# Patient Record
Sex: Female | Born: 1945 | Race: White | Hispanic: No | Marital: Married | Smoking: Former smoker
Health system: Southern US, Community
[De-identification: ages and names within clinical notes are randomized; demographics above are authoritative.]

## PROBLEM LIST (undated history)

## (undated) DIAGNOSIS — J45909 Unspecified asthma, uncomplicated: Secondary | ICD-10-CM

## (undated) DIAGNOSIS — C801 Malignant (primary) neoplasm, unspecified: Secondary | ICD-10-CM

## (undated) DIAGNOSIS — I639 Cerebral infarction, unspecified: Secondary | ICD-10-CM

## (undated) DIAGNOSIS — I10 Essential (primary) hypertension: Secondary | ICD-10-CM

## (undated) DIAGNOSIS — J449 Chronic obstructive pulmonary disease, unspecified: Secondary | ICD-10-CM

## (undated) DIAGNOSIS — J439 Emphysema, unspecified: Secondary | ICD-10-CM

## (undated) DIAGNOSIS — E119 Type 2 diabetes mellitus without complications: Secondary | ICD-10-CM

## (undated) HISTORY — PX: ABDOMINAL HYSTERECTOMY: SHX81

## (undated) HISTORY — DX: Cerebral infarction, unspecified: I63.9

## (undated) HISTORY — PX: NECK SURGERY: SHX720

## (undated) HISTORY — PX: CHOLECYSTECTOMY: SHX55

---

## 1988-03-17 HISTORY — PX: BREAST BIOPSY: SHX20

## 2004-07-23 ENCOUNTER — Other Ambulatory Visit: Payer: Self-pay

## 2004-07-26 ENCOUNTER — Ambulatory Visit: Payer: Self-pay | Admitting: Unknown Physician Specialty

## 2004-12-09 ENCOUNTER — Ambulatory Visit: Payer: Self-pay | Admitting: Internal Medicine

## 2006-07-15 ENCOUNTER — Ambulatory Visit: Payer: Self-pay | Admitting: *Deleted

## 2006-10-21 ENCOUNTER — Emergency Department: Payer: Self-pay | Admitting: Internal Medicine

## 2006-10-31 ENCOUNTER — Emergency Department: Payer: Self-pay | Admitting: Emergency Medicine

## 2007-07-14 ENCOUNTER — Ambulatory Visit: Payer: Self-pay | Admitting: Unknown Physician Specialty

## 2007-07-30 ENCOUNTER — Ambulatory Visit: Payer: Self-pay | Admitting: Unknown Physician Specialty

## 2007-08-30 ENCOUNTER — Inpatient Hospital Stay: Payer: Self-pay | Admitting: Internal Medicine

## 2007-08-30 ENCOUNTER — Other Ambulatory Visit: Payer: Self-pay

## 2008-01-11 ENCOUNTER — Emergency Department: Payer: Self-pay | Admitting: Emergency Medicine

## 2008-03-17 DIAGNOSIS — C801 Malignant (primary) neoplasm, unspecified: Secondary | ICD-10-CM

## 2008-03-17 HISTORY — DX: Malignant (primary) neoplasm, unspecified: C80.1

## 2008-06-07 ENCOUNTER — Emergency Department: Payer: Self-pay | Admitting: Emergency Medicine

## 2008-06-20 ENCOUNTER — Emergency Department: Payer: Self-pay | Admitting: Unknown Physician Specialty

## 2008-06-24 ENCOUNTER — Observation Stay (HOSPITAL_COMMUNITY): Admission: EM | Admit: 2008-06-24 | Discharge: 2008-06-27 | Payer: Self-pay | Admitting: Emergency Medicine

## 2010-01-07 ENCOUNTER — Emergency Department (HOSPITAL_COMMUNITY)
Admission: EM | Admit: 2010-01-07 | Discharge: 2010-01-08 | Payer: Self-pay | Source: Home / Self Care | Admitting: Emergency Medicine

## 2010-05-29 LAB — POCT I-STAT, CHEM 8
Calcium, Ion: 1.19 mmol/L (ref 1.12–1.32)
HCT: 42 % (ref 36.0–46.0)
TCO2: 29 mmol/L (ref 0–100)

## 2010-05-29 LAB — GLUCOSE, CAPILLARY: Glucose-Capillary: 110 mg/dL — ABNORMAL HIGH (ref 70–99)

## 2010-06-26 LAB — COMPREHENSIVE METABOLIC PANEL
ALT: 11 U/L (ref 0–35)
ALT: 15 U/L (ref 0–35)
AST: 16 U/L (ref 0–37)
AST: 16 U/L (ref 0–37)
Albumin: 3.6 g/dL (ref 3.5–5.2)
Albumin: 4.4 g/dL (ref 3.5–5.2)
Alkaline Phosphatase: 45 U/L (ref 39–117)
Alkaline Phosphatase: 56 U/L (ref 39–117)
BUN: 6 mg/dL (ref 6–23)
BUN: 8 mg/dL (ref 6–23)
CO2: 28 mEq/L (ref 19–32)
CO2: 30 mEq/L (ref 19–32)
Calcium: 9 mg/dL (ref 8.4–10.5)
Calcium: 9.1 mg/dL (ref 8.4–10.5)
Chloride: 103 mEq/L (ref 96–112)
Chloride: 108 mEq/L (ref 96–112)
Creatinine, Ser: 0.73 mg/dL (ref 0.4–1.2)
Creatinine, Ser: 0.78 mg/dL (ref 0.4–1.2)
GFR calc Af Amer: >60 mL/min (ref >60)
GFR calc Af Amer: >60 mL/min (ref >60)
GFR calc non Af Amer: >60 mL/min (ref >60)
GFR calc non Af Amer: >60 mL/min (ref >60)
Glucose, Bld: 104 mg/dL — ABNORMAL HIGH (ref 70–99)
Glucose, Bld: 96 mg/dL (ref 70–99)
Potassium: 3.3 mEq/L — ABNORMAL LOW (ref 3.5–5.1)
Potassium: 4.1 mEq/L (ref 3.5–5.1)
Sodium: 143 mEq/L (ref 135–145)
Sodium: 143 mEq/L (ref 135–145)
Total Bilirubin: 0.8 mg/dL (ref 0.3–1.2)
Total Bilirubin: 0.9 mg/dL (ref 0.3–1.2)
Total Protein: 5.8 g/dL — ABNORMAL LOW (ref 6.0–8.3)
Total Protein: 6.7 g/dL (ref 6.0–8.3)

## 2010-06-26 LAB — URINE MICROSCOPIC-ADD ON

## 2010-06-26 LAB — CBC
HCT: 34.7 % — ABNORMAL LOW (ref 36.0–46.0)
HCT: 36.5 % (ref 36.0–46.0)
HCT: 40.8 % (ref 36.0–46.0)
Hemoglobin: 12.1 g/dL (ref 12.0–15.0)
Hemoglobin: 12.6 g/dL (ref 12.0–15.0)
Hemoglobin: 13.9 g/dL (ref 12.0–15.0)
MCHC: 34 g/dL (ref 30.0–36.0)
MCHC: 34.4 g/dL (ref 30.0–36.0)
MCHC: 34.9 g/dL (ref 30.0–36.0)
MCV: 95.4 fL (ref 78.0–100.0)
MCV: 95.7 fL (ref 78.0–100.0)
MCV: 96.1 fL (ref 78.0–100.0)
Platelets: 133 10*3/uL — ABNORMAL LOW (ref 150–400)
Platelets: 135 10*3/uL — ABNORMAL LOW (ref 150–400)
Platelets: 172 10*3/uL (ref 150–400)
RBC: 3.63 MIL/uL — ABNORMAL LOW (ref 3.87–5.11)
RBC: 3.82 MIL/uL — ABNORMAL LOW (ref 3.87–5.11)
RBC: 4.25 MIL/uL (ref 3.87–5.11)
RDW: 12.5 % (ref 11.5–15.5)
RDW: 12.6 % (ref 11.5–15.5)
RDW: 12.6 % (ref 11.5–15.5)
WBC: 4.4 10*3/uL (ref 4.0–10.5)
WBC: 5.8 10*3/uL (ref 4.0–10.5)
WBC: 7.4 10*3/uL (ref 4.0–10.5)

## 2010-06-26 LAB — URINALYSIS, ROUTINE W REFLEX MICROSCOPIC
Bilirubin Urine: NEGATIVE
Glucose, UA: NEGATIVE mg/dL
Hgb urine dipstick: NEGATIVE
Ketones, ur: NEGATIVE mg/dL
Nitrite: POSITIVE — AB
Protein, ur: NEGATIVE mg/dL
Specific Gravity, Urine: 1.009 (ref 1.005–1.030)
Urobilinogen, UA: 1 mg/dL (ref 0.0–1.0)
pH: 6.5 (ref 5.0–8.0)

## 2010-06-26 LAB — DIFFERENTIAL
Basophils Absolute: 0 10*3/uL (ref 0.0–0.1)
Basophils Relative: 1 % (ref 0–1)
Eosinophils Absolute: 0.1 10*3/uL (ref 0.0–0.7)
Eosinophils Relative: 1 % (ref 0–5)
Lymphocytes Relative: 33 % (ref 12–46)
Lymphs Abs: 2.4 10*3/uL (ref 0.7–4.0)
Monocytes Absolute: 0.5 10*3/uL (ref 0.1–1.0)
Monocytes Relative: 6 % (ref 3–12)
Neutro Abs: 4.4 10*3/uL (ref 1.7–7.7)
Neutrophils Relative %: 59 % (ref 43–77)

## 2010-06-26 LAB — GLUCOSE, CAPILLARY
Glucose-Capillary: 100 mg/dL — ABNORMAL HIGH (ref 70–99)
Glucose-Capillary: 100 mg/dL — ABNORMAL HIGH (ref 70–99)
Glucose-Capillary: 103 mg/dL — ABNORMAL HIGH (ref 70–99)
Glucose-Capillary: 111 mg/dL — ABNORMAL HIGH (ref 70–99)
Glucose-Capillary: 117 mg/dL — ABNORMAL HIGH (ref 70–99)
Glucose-Capillary: 121 mg/dL — ABNORMAL HIGH (ref 70–99)
Glucose-Capillary: 122 mg/dL — ABNORMAL HIGH (ref 70–99)
Glucose-Capillary: 126 mg/dL — ABNORMAL HIGH (ref 70–99)
Glucose-Capillary: 127 mg/dL — ABNORMAL HIGH (ref 70–99)
Glucose-Capillary: 129 mg/dL — ABNORMAL HIGH (ref 70–99)
Glucose-Capillary: 129 mg/dL — ABNORMAL HIGH (ref 70–99)
Glucose-Capillary: 130 mg/dL — ABNORMAL HIGH (ref 70–99)
Glucose-Capillary: 144 mg/dL — ABNORMAL HIGH (ref 70–99)
Glucose-Capillary: 89 mg/dL (ref 70–99)
Glucose-Capillary: 91 mg/dL (ref 70–99)
Glucose-Capillary: 94 mg/dL (ref 70–99)
Glucose-Capillary: 96 mg/dL (ref 70–99)
Glucose-Capillary: 98 mg/dL (ref 70–99)

## 2010-06-26 LAB — RETICULOCYTES
RBC.: 4.04 MIL/uL (ref 3.87–5.11)
Retic Count, Absolute: 92.9 K/uL (ref 19.0–186.0)
Retic Ct Pct: 2.3 % (ref 0.4–3.1)

## 2010-06-26 LAB — FOLATE: Folate: >20 ng/mL

## 2010-06-26 LAB — PHOSPHORUS: Phosphorus: 3.6 mg/dL (ref 2.3–4.6)

## 2010-06-26 LAB — URINE CULTURE: Colony Count: >=100000

## 2010-06-26 LAB — TSH
TSH: 0.184 u[IU]/mL — ABNORMAL LOW (ref 0.350–4.500)
TSH: 0.454 u[IU]/mL (ref 0.350–4.500)

## 2010-06-26 LAB — IRON AND TIBC
Iron: 75 ug/dL (ref 42–135)
Saturation Ratios: 23 % (ref 20–55)
TIBC: 327 ug/dL (ref 250–470)
UIBC: 252 ug/dL

## 2010-06-26 LAB — CANCER ANTIGEN 19-9: CA 19-9: 18.3 U/mL — ABNORMAL LOW

## 2010-06-26 LAB — HEMOGLOBIN A1C
Hgb A1c MFr Bld: 6.7 % — ABNORMAL HIGH (ref 4.6–6.1)
Mean Plasma Glucose: 146 mg/dL

## 2010-06-26 LAB — HCG, QUANTITATIVE, PREGNANCY: hCG, Beta Chain, Quant, S: 4 m[IU]/mL (ref <5)

## 2010-06-26 LAB — CEA: CEA: 1.4 ng/mL (ref 0.0–5.0)

## 2010-06-26 LAB — VITAMIN B12: Vitamin B-12: 258 pg/mL (ref 211–911)

## 2010-06-26 LAB — SEDIMENTATION RATE: Sed Rate: 9 mm/h (ref 0–22)

## 2010-06-26 LAB — MAGNESIUM: Magnesium: 2.1 mg/dL (ref 1.5–2.5)

## 2010-06-26 LAB — T3, FREE: T3, Free: 2.8 pg/mL (ref 2.3–4.2)

## 2010-06-26 LAB — T4, FREE: Free T4: 1.46 ng/dL (ref 0.80–1.80)

## 2010-06-26 LAB — FERRITIN: Ferritin: 68 ng/mL (ref 22–322)

## 2010-06-26 LAB — AFP TUMOR MARKER: AFP-Tumor Marker: 2.2 ng/mL (ref 0.0–8.0)

## 2010-07-13 ENCOUNTER — Ambulatory Visit: Payer: Self-pay | Admitting: Anesthesiology

## 2010-07-30 NOTE — H&P (Signed)
Kristin [name], [name] NO.:  [id number]   MEDICAL RECORD NO.:  [id number]          PATIENT TYPE:  EMS   LOCATION:  MAJO                         FACILITY:  [facility]   PHYSICIAN:  Lucita Ferrara, MD         DATE OF BIRTH:  25-Apr-1945   DATE OF ADMISSION:  06/24/2008  DATE OF DISCHARGE:                              HISTORY & PHYSICAL   Patient is a 65 year old with chief complaint generalized weakness for 2  or 3 months now.   HISTORY OF PRESENT ILLNESS:  A 65 year old female, lives in [place],  presents with weakness and weight loss of 9 pounds in the last 3 months.  The patient states that it is she had a recurrent urinary tract  infection for the last 3 months.  She is presenting to her primary care  physician with no results.  She is also unable to hold fluids down.  She states that everything tastes funny.  She has had some nausea and  vomiting.  Vomitus nonbiliary, nonbloody.  There is no hematemesis,  hematochezia.  She has had a colonoscopy in the past.  Negative results.  She is a current smoker.  Otherwise 12-point review of systems is  negative.  She had a workup in the emergency room.  Per her labs, it  does not look like she has dehydration, yet clinically she looks dry.  She does have a urinary tract infection.  I was asked to admit for  patient not feeling comfortable to go home, urinary tract infection and  not being able to tolerate p.o.   PAST MEDICAL HISTORY:  Significant for:  1. Diabetes type 2.  2. Recurrent urinary tract infection.  3. COPD.  4. Hypertension.   FAMILY HISTORY:  Significant for coronary artery disease and  hypertension.   SOCIAL HISTORY:  The patient denies drugs, alcohol.  She currently  smokes 1/2 pack per day for the last 10 years.   ALLERGIES:  Allergic to CODEINE and MORPHINE.   MEDICATIONS AT HOME:  1. Synthroid.  2. Trazodone.  3. Spiriva.  4. Xopenex.  5. Perforomist Inhaler  6. Alprazolam.  7. Meclizine.  8. Aspirin.  9. Losartan.  10.Lasix.  11.Zofran.   REVIEW OF SYSTEMS:  As per HPI, otherwise negative.   PHYSICAL EXAMINATION:  Generally speaking the patient is an elderly  female.  She looks worn down, weak and somewhat cachectic.  HEENT: Normocephalic and atraumatic.  Sclerae are anicteric.  NECK:  Supple with no JVD.  CARDIOVASCULAR:  S1, S2.  Regular rate and rhythm.  No murmurs, rubs,  clicks.  LUNGS: Clear to auscultation bilaterally.  No rhonchi, rales or wheeze.  ABDOMEN:  Soft, nontender, nondistended.  Positive bowel sounds.  EXTREMITIES:  No cyanosis, clubbing or edema.  NEUROLOGIC:  The patient is alert and oriented x3.  Cranial nerves  II-  XII grossly intact.  VITALS:  Blood pressure 110/43, pulse 77, respirations 20, pulse ox is  97% on room air, temperature 98.5.   LABORATORY DATA:  UA shows many bacteria, specific gravity 1.009, large  amount of leukocytes,  large amount of nitrites.  Potassium 3.3, BUN 8,  creatinine 0.73.  CBC within normal limits.   ASSESSMENT/PLAN:  The patient is a 65 year old who presents with  generalized weakness, weight loss of 9 pounds in the last 3 months.  Clinically she may have a urinary tract infection.  Will go ahead and  admit her for urinary tract infection.  She is not anemic.  I think that  she has colon cancer.  She has had screening in the past.  Generalized  weakness may be secondary to depression.  She is not suicidal.  Her  decreased p.o. intake may need to be worked up with endoscopy.  Will go  ahead and start Protonix proton pump inhibitor.  Will initiate gentle  hydration.  Will initiate treatment for urinary tract infection send off  cultures.  Deep vein thrombosis and gastrointestinal prophylaxis with  Lovenox and Protonix.  The rest of plans are dependent on her progress.      Lucita Ferrara, MD  Electronically Signed     RR/MEDQ  D:  06/24/2008  T:  06/24/2008  Job:  [id number]

## 2010-07-30 NOTE — Discharge Summary (Signed)
Kristin [name], Kristin [name]               ACCOUNT NO.:  [id number]   MEDICAL RECORD NO.:  [id number]          PATIENT TYPE:  INP   LOCATION:  4735                         FACILITY:  [facility]   PHYSICIAN:  Herbie Saxon, MDDATE OF BIRTH:  08/02/1945   DATE OF ADMISSION:  06/24/2008  DATE OF DISCHARGE:  06/27/2008                               DISCHARGE SUMMARY   DISCHARGE DIAGNOSES:  1. Urinary tract infection, recurrent.  2. Deconditioning.  3. Tobacco abuse, ongoing.  4. Diabetes mellitus.  5. Chronic obstructive pulmonary disease.  6. Hypertension.  7. Hypothyroidism.  8. Depression.  9. Mild anemia.  10.Mild thrombocytopenia.  11.Coronary artery disease, by workup.   RADIOLOGY:  The CT chest of June 25, 2008, shows a centrilobular  emphysema, subsegmental atelectasis in both lung bases, coronary artery  disease, focal thickening of the anterior pericardium.  CT abdomen shows  fatty infiltration of the liver, no acute upper abdominal findings,  ascites.   HOSPITAL COURSE:  This 65 year old Caucasian lady presented with  complaints of generalized weakness over the last 2-3 months, also  unintentional weight loss about 9 pounds in the last 3 months.  The  patient has been having recurrence of urinary tract infection for the  preceding 3 months with associated nausea and vomiting.  Does smokes  about half pack per day for the last 10 years.  Admission urinalysis was  indicative of a urinary tract infection.  Urine culture came positive  for E. Coli, sensitivity to Rocephin, cefazolin, and Septra.  The  Levaquin she was initially started on was discontinued on June 26, 2008, and she was switched over to Rocephin.  Noticed to have borderline  hypoglycemia was started on Lantus and NovoLog insulin coverage.  Hemoglobin A1c was 6.7.   DISCHARGE CONDITION:  Improved.   Appetite has also improved.  No nausea, vomiting or abdominal pain.  She  is feeling better.  She is to be  discharged home today.   DIET:  A 1800-calorie ADA, 2 g sodium, low cholesterol.   ACTIVITY:  Increase slowly as tolerated.   FOLLOWUP:  Followup with her primary care physician Dr. Bethena Midget in the next  few days to reassess the patient.  Primary care physician would also  continue to optimize glycemic control as outpatient.   MEDICATIONS ON DISCHARGE:  1. Note that the thyroid function test did show she has a low TSH,      Synthroid __________ mcg daily.  2. Lasix 40 mg daily.  3. Losartan 50 mg daily.  4. Aspirin 81 mg b.i.d.  5. Meclizine 25 mg q.8 h. p.r.n.  6. Xanax 1 mg q.12 h. p.r.n.  7. Perforomist inhaler twice daily.  8. Xopenex 2 puffs q.6 hours as needed.  9. Spiriva HandiHaler 18 mcg once daily.  10.Trazodone 100 mg at bedtime.  11.Potassium chloride 10 mEq daily.  12.Multivitamin 1 tablet daily.  13.Tylenol 650 mg q.6 h. p.r.n. mild pain or fever.  14.Septra __________ for the next 5 days.  15.__________ 2.5 mg daily.   Diabetic education as outpatient continued with primary care management.  PHYSICAL EXAMINATION:  GENERAL:  On examination today, she is an elderly  lady not in acute respiratory distress.  VITAL SIGNS:  Stable.  Temperature 98, pulse 64, respiratory rate is 14,  blood pressure 124/60.  HEENT:  Pupils are equal and reactive to light and accommodation.  Head  is atraumatic and normocephalic.  NECK:  Supple.  Not jaundiced.  She is not ill.  There is no elevated  JVD, no thyromegaly, no carotid bruit.  CHEST:  Clinically clear.  HEART:  Heart sounds 1 and 2.  Regular rate and rhythm.  No murmurs,  rubs, gallops, or clicks.  ABDOMEN:  Benign.  EXTREMITIES:  No edema or cyanosis.  CNS:  No neurological deficits.  ABDOMEN:  Soft, nontender.  Bowel sounds positive.  No inguinal hernia.   LABORATORY DATA:  Free T4 __________, T3 2.8, TSH is 0.454.  __________  2.2.  Urine culture E. coli.  Complete blood count, WBC is 5, hematocrit  34, platelet  count is 135.  Chemistry sodium is 136, potassium 4.1,  chloride 108, bicarbonate 30, glucose 96, BUN 6, creatinine __________.  Please note correction in the Synthroid dose.  As the repeat __________  is normal, we can increase the Synthroid dose as above to 75.  It can be  titrated up to 100 mcg daily, also we will repeat thyroid function test  in the next 4 to 6 weeks by the primary care physician.   DISCHARGE TIME:  Greater than 30 minutes.      Herbie Saxon, MD  Electronically Signed     MIO/MEDQ  D:  06/27/2008  T:  06/28/2008  Job:  [id number]

## 2010-08-01 ENCOUNTER — Other Ambulatory Visit: Payer: Self-pay | Admitting: Neurosurgery

## 2010-08-01 DIAGNOSIS — R0989 Other specified symptoms and signs involving the circulatory and respiratory systems: Secondary | ICD-10-CM

## 2010-08-02 ENCOUNTER — Ambulatory Visit
Admission: RE | Admit: 2010-08-02 | Discharge: 2010-08-02 | Disposition: A | Discharge status: Home / Self Care | Payer: BC Managed Care – PPO | Source: Ambulatory Visit | Attending: Neurosurgery | Admitting: Neurosurgery

## 2010-08-02 DIAGNOSIS — R0989 Other specified symptoms and signs involving the circulatory and respiratory systems: Secondary | ICD-10-CM

## 2010-09-09 ENCOUNTER — Encounter (HOSPITAL_COMMUNITY)
Admission: RE | Admit: 2010-09-09 | Discharge: 2010-09-09 | Disposition: A | Discharge status: Home / Self Care | Payer: BC Managed Care – PPO | Source: Ambulatory Visit | Attending: Neurosurgery | Admitting: Neurosurgery

## 2010-09-09 LAB — CBC
HCT: 39.9 % (ref 36.0–46.0)
Hemoglobin: 13.7 g/dL (ref 12.0–15.0)
WBC: 6.9 10*3/uL (ref 4.0–10.5)

## 2010-09-09 LAB — BASIC METABOLIC PANEL
BUN: 11 mg/dL (ref 6–23)
Chloride: 103 mEq/L (ref 96–112)
Glucose, Bld: 101 mg/dL — ABNORMAL HIGH (ref 70–99)
Potassium: 3.4 mEq/L — ABNORMAL LOW (ref 3.5–5.1)

## 2010-09-12 ENCOUNTER — Ambulatory Visit (HOSPITAL_COMMUNITY)
Admission: RE | Admit: 2010-09-12 | Discharge: 2010-09-13 | DRG: 864 | Disposition: A | Discharge status: Home / Self Care | Payer: BC Managed Care – PPO | Source: Ambulatory Visit | Attending: Neurosurgery | Admitting: Neurosurgery

## 2010-09-12 ENCOUNTER — Ambulatory Visit (HOSPITAL_COMMUNITY): Payer: BC Managed Care – PPO

## 2010-09-12 DIAGNOSIS — J449 Chronic obstructive pulmonary disease, unspecified: Secondary | ICD-10-CM | POA: Insufficient documentation

## 2010-09-12 DIAGNOSIS — M5030 Other cervical disc degeneration, unspecified cervical region: Secondary | ICD-10-CM | POA: Insufficient documentation

## 2010-09-12 DIAGNOSIS — M47812 Spondylosis without myelopathy or radiculopathy, cervical region: Secondary | ICD-10-CM | POA: Insufficient documentation

## 2010-09-12 DIAGNOSIS — I10 Essential (primary) hypertension: Secondary | ICD-10-CM | POA: Insufficient documentation

## 2010-09-12 DIAGNOSIS — J4489 Other specified chronic obstructive pulmonary disease: Secondary | ICD-10-CM | POA: Insufficient documentation

## 2010-09-12 LAB — GLUCOSE, CAPILLARY
Glucose-Capillary: 114 mg/dL — ABNORMAL HIGH (ref 70–99)
Glucose-Capillary: 107 mg/dL — ABNORMAL HIGH (ref 70–99)

## 2010-09-13 LAB — GLUCOSE, CAPILLARY: Glucose-Capillary: 117 mg/dL — ABNORMAL HIGH (ref 70–99)

## 2010-09-26 NOTE — Op Note (Signed)
Kristin [name], Kristin [name] NO.:  [id number]  MEDICAL RECORD NO.:  [id number]  LOCATION:  3535                         FACILITY:  [facility]  PHYSICIAN:  Cristi Loron, M.D.DATE OF BIRTH:  1945/08/07  DATE OF PROCEDURE:  09/12/2010 DATE OF DISCHARGE:                              OPERATIVE REPORT   BRIEF HISTORY:  The patient is a 65 year old white female who has suffered from neck, shoulder, arm pain consistent with a cervical radiculopathy/myelopathy.  She has failed medical management and worked up with a cervical MRI which demonstrated the patient had spondylosis, stenosis, etc., at C4-5, C5-6.  I discussed the various treatment options with the patient including surgery.  She has weighed the risks, benefits, and alternatives of surgery and decided to proceed with C4-5 and C5-6 anterior cervical diskectomy fusion and plating.  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  POSTOPERATIVE DIAGNOSES:  C4-5 and C5-6 disk degeneration, spondylosis, stenosis, cervical radiculopathy/myelopathy, cervicalgia.  PROCEDURES:  C4-5 and C5-6 extensive anterior cervical diskectomy/decompression; C4-C5 and C5-C6 anterior interbody arthrodesis with local morselized autograft bone and Actifuse bone graft extender; insertion of C4-5 and C5-6 anterior interbody arthrodesis (Novel PEEK interbody prosthesis); anterior cervical plating with Skyline titanium plate and screws from C4 through C6.  SURGEON:  Cristi Loron, MD.  ASSISTANT:  Clydene Fake, MD.  ANESTHESIA:  Endotracheal.  ESTIMATED BLOOD LOSS:  100 mL.  SPECIMENS:  None.  DRAINS:  None.  COMPLICATIONS:  None.  DESCRIPTION OF PROCEDURE:  The patient was brought to the operating room by the Anesthesia Team.  General endotracheal anesthesia was induced. The patient remained in the supine position.  A roll was placed  under shoulders to keep her neck in a neutral position.  Her anterior cervical region was then prepared with Betadine scrub with Betadine solution. Sterile drapes were applied.  I then injected the area to be incised with Marcaine with epinephrine solution.  I used a scalpel to make a transverse incision in the patient's left anterior neck.  I used the Metzenbaum scissors to divide the platysma muscle and then to dissect medial to sternocleidomastoid muscle, jugular vein, and carotid artery. I carefully dissected down towards the anterior cervical spine identifying the esophagus and retracted it medially.  I then used Kittner swab to clear soft tissue from the anterior cervical spine and then inserted a bent spinal needle into the upper exposed intervertebral disk space.  We obtained intraoperative radiograph to confirm our location.  I then used electrocautery to detach the medial border of the longus colli muscle bilaterally from the C4-C5 and C5-C6 intervertebral disk space.  I then inserted the Caspar self-retaining retractor underneath the longus colli muscle bilaterally to provide exposure.  We began the decompression at C4-C5.  I incised the C4-C5 intervertebral disk with a #15-blade scalpel and performed a partial intervertebral diskectomy with a pituitary forceps.  We then inserted the distraction screws at C4-C5 and distracted the interspace.  I then used a high-speed drill to decorticate the vertebral endplates at C4-C5, to drill away the remainder of C4-C5 intervertebral disk, to drill away some posterior spondylosis, and to thin out the  posterior longitudinal ligament.  We then incised the ligament with arachnoid knife and then removed it with a Kerrison punch undercutting the vertebral endplates at C4-C5 decompressing the thecal sac.  We then performed a foraminotomy about the bilateral C5 nerve roots completing the decompression at this level.  We then repeated this  procedure in an analogous fashion at C5-6 decompressing the C5-6 thecal sac and bilateral C6 nerve roots.  We now turned our attention to the arthrodesis.  We determined to use a 6 and a 5-mm interbody prostheses.  We prefilled these prostheses with a combination of local morselized autograft bone.  We obtained our decompression as well as Actifuse bone graft extender.  We then inserted the prefilled prosthesis into the distracted interspaces at C4-5 and C5- 6 and then removed the distraction screws.  There was a good snug fit of the prosthesis at both interspaces.  We now turned our attention to the anterior spinal instrumentation.  We used a high-speed drill to drill away some ventral spondylosis at  C4-5 and C5-6, so that the plate will lay down flat.  We selected the appropriate-length Skyline anterior cervical plate and laid it along the anterior aspect of the vertebral bodies from C4 through C6.  We then drilled two 12-mm holes at C4, 5, and 6 and then secured the plate to vertebral bodies by placing two 12-mm self-tapping screws at C4, 5, and 6.  We then obtained intraoperative radiograph which demonstrated good position of plate, screws, and interbody prostheses.  We, therefore, secured the screws and plate by locking each cam.  This completed the instrumentation.  We then obtained hemostasis using bipolar electrocautery.  We irrigated the wound out with bacitracin solution.  We then inspected the esophagus for any damages, none apparent.  We then reapproximated the patient's platysma muscle with interrupted 3-0 Vicryl suture, the subcutaneous tissue with interrupted 3-0 Vicryl suture, and the skin with Steri- Strips and benzoin.  The wound was then coated with bacitracin ointment. A sterile dressing was applied.  The drapes were removed.  The patient was subsequently extubated by the Anesthesia Team and transported to postanesthesia care unit in stable condition.  All sponge,  instrument, and needle counts were correct at the end of this case.     Cristi Loron, M.D.     JDJ/MEDQ  D:  09/12/2010  T:  09/13/2010  Job:  [id number]  Electronically Signed by Tressie Stalker M.D. on 09/26/2010 07:54:33 AM

## 2011-01-29 ENCOUNTER — Ambulatory Visit: Payer: Self-pay | Admitting: Anesthesiology

## 2011-08-05 ENCOUNTER — Ambulatory Visit: Payer: Self-pay | Admitting: Family Medicine

## 2011-08-14 ENCOUNTER — Ambulatory Visit: Payer: Self-pay | Admitting: Surgery

## 2011-08-14 LAB — COMPREHENSIVE METABOLIC PANEL
Alkaline Phosphatase: 58 U/L (ref 50–136)
BUN: 16 mg/dL (ref 7–18)
Chloride: 104 mmol/L (ref 98–107)
Co2: 31 mmol/L (ref 21–32)
Creatinine: 1.01 mg/dL (ref 0.60–1.30)
EGFR (African American): >60
EGFR (Non-African Amer.): 58 — ABNORMAL LOW
Glucose: 99 mg/dL (ref 65–99)
Osmolality: 288 (ref 275–301)
SGOT(AST): 13 U/L — ABNORMAL LOW (ref 15–37)
Total Protein: 7.3 g/dL (ref 6.4–8.2)

## 2011-08-14 LAB — CBC WITH DIFFERENTIAL/PLATELET
Basophil #: 0.1 10*3/uL (ref 0.0–0.1)
Eosinophil %: 2.9 %
HCT: 41.5 % (ref 35.0–47.0)
HGB: 14 g/dL (ref 12.0–16.0)
MCH: 32.8 pg (ref 26.0–34.0)
MCHC: 33.8 g/dL (ref 32.0–36.0)
MCV: 97 fL (ref 80–100)
Monocyte %: 7.2 %
Neutrophil #: 4.4 10*3/uL (ref 1.4–6.5)
Neutrophil %: 50.6 %
Platelet: 164 10*3/uL (ref 150–440)
RBC: 4.27 10*6/uL (ref 3.80–5.20)

## 2011-08-14 LAB — AMYLASE: Amylase: 27 U/L (ref 25–115)

## 2011-08-15 ENCOUNTER — Ambulatory Visit: Payer: Self-pay | Admitting: Surgery

## 2011-08-26 LAB — PATHOLOGY REPORT

## 2012-01-08 ENCOUNTER — Ambulatory Visit: Payer: Self-pay

## 2012-04-15 ENCOUNTER — Emergency Department: Payer: Self-pay | Admitting: Emergency Medicine

## 2012-04-15 LAB — CBC
HCT: 40.3 % (ref 35.0–47.0)
HGB: 13.8 g/dL (ref 12.0–16.0)
MCH: 33 pg (ref 26.0–34.0)
MCV: 96 fL (ref 80–100)
Platelet: 157 10*3/uL (ref 150–440)
RBC: 4.18 10*6/uL (ref 3.80–5.20)
WBC: 9.5 10*3/uL (ref 3.6–11.0)

## 2012-04-15 LAB — BASIC METABOLIC PANEL
BUN: 14 mg/dL (ref 7–18)
Calcium, Total: 9.1 mg/dL (ref 8.5–10.1)
Co2: 29 mmol/L (ref 21–32)
Creatinine: 0.71 mg/dL (ref 0.60–1.30)
EGFR (Non-African Amer.): >60
Glucose: 138 mg/dL — ABNORMAL HIGH (ref 65–99)
Potassium: 3.7 mmol/L (ref 3.5–5.1)

## 2012-05-12 ENCOUNTER — Ambulatory Visit: Payer: Self-pay | Admitting: Family Medicine

## 2012-05-25 ENCOUNTER — Emergency Department: Payer: Self-pay | Admitting: Emergency Medicine

## 2012-05-25 LAB — COMPREHENSIVE METABOLIC PANEL
Albumin: 4 g/dL (ref 3.4–5.0)
Bilirubin,Total: 0.5 mg/dL (ref 0.2–1.0)
Calcium, Total: 8.7 mg/dL (ref 8.5–10.1)
Chloride: 106 mmol/L (ref 98–107)
Co2: 28 mmol/L (ref 21–32)
SGPT (ALT): 15 U/L (ref 12–78)
Sodium: 142 mmol/L (ref 136–145)
Total Protein: 6.6 g/dL (ref 6.4–8.2)

## 2012-05-25 LAB — CBC
MCH: 33 pg (ref 26.0–34.0)
MCV: 98 fL (ref 80–100)
Platelet: 155 10*3/uL (ref 150–440)

## 2012-09-09 ENCOUNTER — Ambulatory Visit: Payer: Self-pay | Admitting: Gastroenterology

## 2012-09-12 LAB — PATHOLOGY REPORT

## 2012-10-22 ENCOUNTER — Ambulatory Visit: Payer: Self-pay | Admitting: Family Medicine

## 2012-11-05 ENCOUNTER — Ambulatory Visit: Payer: Self-pay | Admitting: Gastroenterology

## 2012-11-16 ENCOUNTER — Ambulatory Visit: Payer: Self-pay | Admitting: Gastroenterology

## 2012-11-16 LAB — CREATININE, SERUM
EGFR (African American): >60
EGFR (Non-African Amer.): >60

## 2012-11-26 ENCOUNTER — Ambulatory Visit: Payer: Self-pay | Admitting: Gastroenterology

## 2012-11-29 LAB — PATHOLOGY REPORT

## 2013-02-01 ENCOUNTER — Ambulatory Visit: Payer: Self-pay | Admitting: Gastroenterology

## 2013-03-18 ENCOUNTER — Emergency Department: Payer: Self-pay | Admitting: Emergency Medicine

## 2013-03-18 LAB — COMPREHENSIVE METABOLIC PANEL
ALT: 17 U/L (ref 12–78)
ANION GAP: 4 — AB (ref 7–16)
Albumin: 4.2 g/dL (ref 3.4–5.0)
Alkaline Phosphatase: 66 U/L
BILIRUBIN TOTAL: 0.8 mg/dL (ref 0.2–1.0)
BUN: 13 mg/dL (ref 7–18)
CREATININE: 0.7 mg/dL (ref 0.60–1.30)
Calcium, Total: 9 mg/dL (ref 8.5–10.1)
Chloride: 104 mmol/L (ref 98–107)
Co2: 32 mmol/L (ref 21–32)
EGFR (African American): >60
EGFR (Non-African Amer.): >60
Glucose: 117 mg/dL — ABNORMAL HIGH (ref 65–99)
OSMOLALITY: 281 (ref 275–301)
Potassium: 3.5 mmol/L (ref 3.5–5.1)
SGOT(AST): 16 U/L (ref 15–37)
Sodium: 140 mmol/L (ref 136–145)
Total Protein: 6.9 g/dL (ref 6.4–8.2)

## 2013-03-18 LAB — URINALYSIS, COMPLETE
BILIRUBIN, UR: NEGATIVE
Bacteria: NONE SEEN
Blood: NEGATIVE
GLUCOSE, UR: NEGATIVE mg/dL (ref 0–75)
KETONE: NEGATIVE
Nitrite: NEGATIVE
Ph: 7 (ref 4.5–8.0)
Protein: NEGATIVE
Specific Gravity: 1.005 (ref 1.003–1.030)
Squamous Epithelial: 1

## 2013-03-18 LAB — CBC
HCT: 38.8 % (ref 35.0–47.0)
HGB: 13.2 g/dL (ref 12.0–16.0)
MCH: 33.4 pg (ref 26.0–34.0)
MCHC: 34.1 g/dL (ref 32.0–36.0)
MCV: 98 fL (ref 80–100)
PLATELETS: 128 10*3/uL — AB (ref 150–440)
RBC: 3.96 10*6/uL (ref 3.80–5.20)
RDW: 13.3 % (ref 11.5–14.5)
WBC: 5.9 10*3/uL (ref 3.6–11.0)

## 2013-03-18 LAB — TROPONIN I

## 2013-03-20 LAB — URINE CULTURE

## 2013-06-04 ENCOUNTER — Inpatient Hospital Stay: Payer: Self-pay | Admitting: Internal Medicine

## 2013-06-04 LAB — BASIC METABOLIC PANEL
Anion Gap: 6 — ABNORMAL LOW (ref 7–16)
BUN: 18 mg/dL (ref 7–18)
CALCIUM: 9.4 mg/dL (ref 8.5–10.1)
CO2: 32 mmol/L (ref 21–32)
CREATININE: 0.96 mg/dL (ref 0.60–1.30)
Chloride: 104 mmol/L (ref 98–107)
EGFR (African American): >60
GLUCOSE: 74 mg/dL (ref 65–99)
OSMOLALITY: 284 (ref 275–301)
POTASSIUM: 3.5 mmol/L (ref 3.5–5.1)
SODIUM: 142 mmol/L (ref 136–145)

## 2013-06-04 LAB — CBC
HCT: 40.1 % (ref 35.0–47.0)
HGB: 13.7 g/dL (ref 12.0–16.0)
MCH: 33.6 pg (ref 26.0–34.0)
MCHC: 34.3 g/dL (ref 32.0–36.0)
MCV: 98 fL (ref 80–100)
Platelet: 181 10*3/uL (ref 150–440)
RBC: 4.09 10*6/uL (ref 3.80–5.20)
RDW: 13.1 % (ref 11.5–14.5)
WBC: 8.5 10*3/uL (ref 3.6–11.0)

## 2013-06-04 LAB — TROPONIN I: Troponin-I: <0.02 ng/mL

## 2013-06-05 LAB — BASIC METABOLIC PANEL
ANION GAP: 3 — AB (ref 7–16)
BUN: 20 mg/dL — ABNORMAL HIGH (ref 7–18)
CHLORIDE: 105 mmol/L (ref 98–107)
CO2: 29 mmol/L (ref 21–32)
Calcium, Total: 9.2 mg/dL (ref 8.5–10.1)
Creatinine: 0.91 mg/dL (ref 0.60–1.30)
GLUCOSE: 160 mg/dL — AB (ref 65–99)
OSMOLALITY: 280 (ref 275–301)
POTASSIUM: 3.8 mmol/L (ref 3.5–5.1)
Sodium: 137 mmol/L (ref 136–145)

## 2013-06-06 ENCOUNTER — Inpatient Hospital Stay: Payer: Self-pay | Admitting: Internal Medicine

## 2013-06-06 LAB — PROTIME-INR
INR: 1
Prothrombin Time: 12.7 secs (ref 11.5–14.7)

## 2013-06-06 LAB — COMPREHENSIVE METABOLIC PANEL
ALBUMIN: 4.6 g/dL (ref 3.4–5.0)
ALK PHOS: 43 U/L — AB
AST: 13 U/L — AB (ref 15–37)
Anion Gap: 7 (ref 7–16)
BILIRUBIN TOTAL: 0.3 mg/dL (ref 0.2–1.0)
BUN: 26 mg/dL — AB (ref 7–18)
CALCIUM: 9.3 mg/dL (ref 8.5–10.1)
CHLORIDE: 109 mmol/L — AB (ref 98–107)
Co2: 26 mmol/L (ref 21–32)
Creatinine: 1.14 mg/dL (ref 0.60–1.30)
GFR CALC AF AMER: 58 — AB
GFR CALC NON AF AMER: 50 — AB
Glucose: 118 mg/dL — ABNORMAL HIGH (ref 65–99)
OSMOLALITY: 289 (ref 275–301)
Potassium: 3.4 mmol/L — ABNORMAL LOW (ref 3.5–5.1)
SGPT (ALT): 15 U/L (ref 12–78)
SODIUM: 142 mmol/L (ref 136–145)
Total Protein: 7.3 g/dL (ref 6.4–8.2)

## 2013-06-06 LAB — CBC
HCT: 38.6 % (ref 35.0–47.0)
HGB: 12.9 g/dL (ref 12.0–16.0)
MCH: 32.5 pg (ref 26.0–34.0)
MCHC: 33.3 g/dL (ref 32.0–36.0)
MCV: 98 fL (ref 80–100)
PLATELETS: 199 10*3/uL (ref 150–440)
RBC: 3.96 10*6/uL (ref 3.80–5.20)
RDW: 13.3 % (ref 11.5–14.5)
WBC: 17 10*3/uL — ABNORMAL HIGH (ref 3.6–11.0)

## 2013-06-06 LAB — LIPASE, BLOOD: Lipase: 197 U/L (ref 73–393)

## 2013-06-06 LAB — CK TOTAL AND CKMB (NOT AT ARMC)
CK, Total: 123 U/L
CK-MB: 1.7 ng/mL (ref 0.5–3.6)

## 2013-06-06 LAB — MAGNESIUM: Magnesium: 2.2 mg/dL

## 2013-06-06 LAB — TROPONIN I: Troponin-I: <0.02 ng/mL

## 2013-06-06 LAB — APTT: ACTIVATED PTT: 28.4 s (ref 23.6–35.9)

## 2013-06-07 LAB — CBC WITH DIFFERENTIAL/PLATELET
BASOS PCT: 0.1 %
Basophil #: 0 10*3/uL (ref 0.0–0.1)
EOS PCT: 0 %
Eosinophil #: 0 10*3/uL (ref 0.0–0.7)
HCT: 33.7 % — ABNORMAL LOW (ref 35.0–47.0)
HGB: 11.5 g/dL — ABNORMAL LOW (ref 12.0–16.0)
LYMPHS PCT: 9 %
Lymphocyte #: 0.9 10*3/uL — ABNORMAL LOW (ref 1.0–3.6)
MCH: 33.5 pg (ref 26.0–34.0)
MCHC: 34.1 g/dL (ref 32.0–36.0)
MCV: 98 fL (ref 80–100)
Monocyte #: 0.3 x10 3/mm (ref 0.2–0.9)
Monocyte %: 2.9 %
Neutrophil #: 8.4 10*3/uL — ABNORMAL HIGH (ref 1.4–6.5)
Neutrophil %: 88 %
Platelet: 149 10*3/uL — ABNORMAL LOW (ref 150–440)
RBC: 3.43 10*6/uL — ABNORMAL LOW (ref 3.80–5.20)
RDW: 13.4 % (ref 11.5–14.5)
WBC: 9.6 10*3/uL (ref 3.6–11.0)

## 2013-06-07 LAB — CK TOTAL AND CKMB (NOT AT ARMC)
CK, TOTAL: 95 U/L
CK, TOTAL: 95 U/L
CK-MB: 1.7 ng/mL (ref 0.5–3.6)
CK-MB: 1.6 ng/mL (ref 0.5–3.6)

## 2013-06-07 LAB — BASIC METABOLIC PANEL
Anion Gap: 6 — ABNORMAL LOW (ref 7–16)
BUN: 21 mg/dL — ABNORMAL HIGH (ref 7–18)
CO2: 27 mmol/L (ref 21–32)
Calcium, Total: 8.3 mg/dL — ABNORMAL LOW (ref 8.5–10.1)
Chloride: 109 mmol/L — ABNORMAL HIGH (ref 98–107)
Creatinine: 0.94 mg/dL (ref 0.60–1.30)
EGFR (African American): >60
EGFR (Non-African Amer.): >60
Glucose: 212 mg/dL — ABNORMAL HIGH (ref 65–99)
Osmolality: 292 (ref 275–301)
Potassium: 4.1 mmol/L (ref 3.5–5.1)
SODIUM: 142 mmol/L (ref 136–145)

## 2013-06-07 LAB — TROPONIN I
Troponin-I: <0.02 ng/mL
Troponin-I: <0.02 ng/mL

## 2013-06-09 ENCOUNTER — Ambulatory Visit: Payer: Self-pay | Admitting: Internal Medicine

## 2013-07-12 ENCOUNTER — Ambulatory Visit: Payer: Self-pay | Admitting: Gastroenterology

## 2013-10-18 DIAGNOSIS — E119 Type 2 diabetes mellitus without complications: Secondary | ICD-10-CM

## 2013-11-02 ENCOUNTER — Ambulatory Visit: Payer: Self-pay | Admitting: Family Medicine

## 2013-11-23 ENCOUNTER — Ambulatory Visit: Payer: Self-pay | Admitting: Family Medicine

## 2013-11-23 DIAGNOSIS — K219 Gastro-esophageal reflux disease without esophagitis: Secondary | ICD-10-CM | POA: Diagnosis present

## 2013-12-06 ENCOUNTER — Ambulatory Visit: Payer: Self-pay | Admitting: Family Medicine

## 2014-04-20 ENCOUNTER — Ambulatory Visit: Payer: Self-pay | Admitting: Gastroenterology

## 2014-06-02 DIAGNOSIS — I10 Essential (primary) hypertension: Secondary | ICD-10-CM | POA: Diagnosis present

## 2014-06-02 DIAGNOSIS — E039 Hypothyroidism, unspecified: Secondary | ICD-10-CM | POA: Diagnosis present

## 2014-07-08 NOTE — H&P (Signed)
PATIENT NAME:  Kristin [name], Kristin [name] MR#:  [id number] DATE OF BIRTH:  Feb 28, 1946  DATE OF ADMISSION:  06/04/2013  PRIMARY CARE PHYSICIAN: Okey Dupre Myrla Halsted, MD  CHIEF COMPLAINT: Shortness of breath, wheezing.   HISTORY OF PRESENT ILLNESS: A 69 year old female who was seen at [facility] for shortness of breath, wheezing, was sent to the ER for further evaluation. In the ER, she has signs of COPD exacerbation. She has received nebulizers and steroids without much improvement, so she is asked by the hospitalist for consultation for possible admission. The patient says that over the past few days, she has had shortness of breath, wheezing, coughing, low-grade fever. Her cough is nonproductive. She wears 2 liters of oxygen at home and has not changed this, but she does have shortness of breath as mentioned above. She also says that she has a laryngitis that recently started as well.   REVIEW OF SYSTEMS:  CONSTITUTIONAL: Positive low-grade fever, fatigue, and weakness.  EYES: No blurry or double vision or glaucoma.  EARS, NOSE, THROAT: No ear pain, hearing loss, snoring, seasonal allergies.  RESPIRATORY: Positive cough. Positive wheezing. Positive history of COPD, on 2 liters of oxygen. No hemoptysis. Positive dyspnea. CARDIOVASCULAR: No chest pain, orthopnea, edema, arrhythmia, dyspnea on exertion.  GASTROINTESTINAL: No nausea, vomiting, diarrhea, abdominal pain, melena or ulcers.  GENITOURINARY: No dysuria or hematuria.  ENDOCRINE: No polyuria or polydipsia. HEMATOLOGIC AND LYMPHATIC: No anemia or easy bruising.  SKIN: No rashes or lesions.  MUSCULOSKELETAL: No limited activity.  NEUROLOGIC: No history of CVA, TIA, seizures.  PSYCHIATRIC: No history of anxiety or depression.   PAST MEDICAL HISTORY: 1.  Chronic respiratory failure, on 2 liters of oxygen. 2.  COPD/asthma.  3.  Hypothyroidism. 4.  Hypertension. 5.  Diabetes.  6.  Vertigo.   PAST SURGICAL HISTORY: Partial hysterectomy.    ALLERGIES: MORPHINE AND CODEINE.   MEDICATIONS: 1.  Vitamin B12, 1000 mcg daily.  2.  Ventolin HFA 2 puffs q.6 hours p.r.n.  3.  Trazodone 100 mg at bedtime.  4.  Spiriva 18 mcg daily.  5.  Potassium gluconate 550 mg daily.  6.  Hardin Negus' stool softener daily.  7.  Percocet 5/325 q.6 hours p.r.n. pain.  8.  Pantoprazole 40 mg daily.  9.  Macrobid 100 mg b.i.d.  10.  Losartan 50 mg daily.  11.  Synthroid 100 mcg daily.  12.  Lantus 20 units at bedtime.  13.  Ibuprofen 800 mg q.8 hours p.r.n.  14.  Glimepiride 4 mg 1/2 tablet b.i.d.  15.  Lasix 40 mg daily.  16.  Ferrous sulfate 325 daily.  17.  Fenofibrate 160 mg daily.  18.  Clonazepam 1 mg b.i.d.  19.  Atorvastatin 40 mg daily. 20.  Aspirin 162 mg daily.   FAMILY HISTORY: Positive for lung cancer and ovarian cancer.   SOCIAL HISTORY: The patient is trying to quit smoking. She smokes 1 to 5 cigarettes a day. The patient was counseled for 4 minutes regarding this. No IV drug use or alcohol.   PHYSICAL EXAMINATION: VITAL SIGNS: Temperature 98.4, pulse 86, blood pressure 117/62, respiratory rate 20. O2 is 94% on 2 liters.  GENERAL: The patient is sitting up, no tripod, not in acute distress.  HEENT: Head is atraumatic. Pupils are round and reactive. Sclerae anicteric. Mucous membranes are moist. Oropharynx is clear.  NECK: Supple without JVD, carotid bruit or enlarged thyroid.  CARDIOVASCULAR: Regular rate and rhythm. No murmurs, gallops, or rubs. PMI is not displaced.  LUNGS: Diffuse wheezing throughout her lung fields with normal chest expansion. No egophony.  ABDOMEN: Bowel sounds positive. Nontender, nondistended. No hepatosplenomegaly.  EXTREMITIES: No clubbing, cyanosis, or edema.  NEUROLOGIC: Cranial nerves II through XII are intact. There are no focal deficits. SKIN: Without rashes or lesions. MUSCULOSKELETAL: 5 out 5 strength in all extremities.   DIAGNOSTIC DATA: Laboratories: Sodium 142, potassium 3.8 chloride  104, bicarbonate 32, BUN 18, creatinine 0.96, glucose 74. Troponin less than 0.02. White blood cells 8.5, hemoglobin 13.7, hematocrit 40.1; platelets are 181.   Chest x-ray shows no acute cardiopulmonary disease.   EKG reads normal sinus rhythm. No ST elevations or depression. There is an incomplete right bundle branch block.   ASSESSMENT AND PLAN: This is a 69 year old female with chronic respiratory failure on 2 liters of oxygen, who presents with acute chronic obstructive pulmonary disease exacerbation.  1.  Acute on chronic respiratory failure from chronic obstructive pulmonary disease exacerbation as outlined below.  2.  Acute chronic obstructive pulmonary disease exacerbation. The patient will be admitted to medical/surgery floor. We will continue IV steroids, Solu-Medrol 80 mg IV q.8 hours, DuoNebs. Continue her outpatient inhalers and add azithromycin 500 mg daily. Continue to monitor. She is on 2 liters of oxygen, which is at her baseline.  3.  Diabetes. We will continue her outpatient medications including Lantus and glimepiride. ADA diet. Sliding scale insulin.  4.  Tobacco dependence. The patient was counseled regarding quitting smoking. She says she is trying to quit. She says she is down to 1 to 5 cigarettes. Will continue to encourage her to quit smoking. She does not want a nicotine patch. The patient was counseled for 3 minutes.  5.  Hypertension. Will continue with losartan.  6.  Hypothyroidism. Will continue on Synthroid 100 mcg daily.  7.  Hyperlipidemia: Will continue her outpatient medications.  8.  Anxiety. Will continue trazodone and her benzodiazepine.  9.  The patient is full code status.   TIME SPENT: Approximately 45 minutes.   ____________________________ Donell Beers. Benjie Karvonen, MD spm:jcm D: 06/04/2013 18:07:38 ET T: 06/04/2013 19:32:42 ET JOB#: [id number]  cc: Jalayia Bagheri P. Benjie Karvonen, MD, <Dictator> [facility] Myrla Halsted, MD Arionne Iams P Brevon Dewald MD ELECTRONICALLY SIGNED 06/04/2013 20:34

## 2014-07-08 NOTE — H&P (Signed)
PATIENT NAME:  Kristin [name], Kristin [name] MR#:  [id number] DATE OF BIRTH:  12/12/1945  DATE OF ADMISSION:  06/06/2013  PRIMARY CARE PHYSICIAN: Dr. Keith Rake.   REFERRING PHYSICIAN: Dr. Hinda Kehr.   CHIEF COMPLAINT: Shortness of breath.   HISTORY OF PRESENT ILLNESS: Kristin [name] is a 69 year old female with a history of COPD, continued tobacco use who was admitted on 06/04/2013 for COPD exacerbation and was discharged today. The patient states prior to discharge, the patient was doing well. Went home. A couple of hours later, started to notice shortness of breath which gradually worsened to the point that the patient was unable to catch breath. Called EMS. The patient was diffusely wheezing when EMS arrived. The patient received multiple breathing treatments and placed on BiPAP and was brought to the Emergency Department. Per her husband, the patient has seasonal allergies and has a lot of pollen around her house. Experienced some subjective fevers. The patient received multiple breathing treatments in the Emergency Department. ABG obtained in the Emergency Department was well within normal limits. The patient is on BiPAP. Chest x-ray: No active disease.   PAST MEDICAL HISTORY:  1. 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  2. Hypothyroidism.  3. Hypertension.  4. Diabetes mellitus.  5. Vertigo.   PAST SURGICAL HISTORY: Partial hysterectomy.   HOME MEDICATIONS: The patient had is on multiple medications.  1. Vitamin B12 1000 mg once a day.  2. Ventolin 2 puffs every 6 hours as needed.  3. Trazodone 100 mg once a day.  4. Spiriva 18 mcg once a day.  5. Phenergan 25 mg 2 times a day.  6. Prednisone 40 mg once a day.  7. Potassium gluconate 550 mg once a day.  8. Stool softener 1 capsule once a day.  9. Protonix 40 mg 2 times a day.  10. Losartan 50 mg once a day.  11. Levothyroxine 100 mcg once a day.  12. Lantus 10 units subcutaneous once a day.  13. Glimepiride 4 mg 1/2 tablet 2 times a  day.  14. Lasix 40 mg once a day.  15. Ferrous sulfate 325 mg once a day.  16. Fenofibrate 160 mg once a day.  17. Clonazepam 1 mg 2 times a day.  18. Benzonatate 100 mg 2 times a day.  19. Atorvastatin 40 mg once a day.  20. Aspirin 81 mg 2 tablets once a day.   SOCIAL HISTORY: Continues to smoke 5 to 6 cigarettes a day. In the past, smoked heavily. Denies drinking alcohol or using illicit drugs. Married, lives with her husband.   FAMILY HISTORY: Positive for lung cancer and ovarian cancer.   REVIEW OF SYSTEMS:  CONSTITUTIONAL: Generalized weakness.  EYES: No change in vision.  ENT: No change in hearing.  RESPIRATORY: Has cough, shortness of breath.  CARDIOVASCULAR: No chest pain, palpitations.  GASTROINTESTINAL: No nausea, vomiting, abdominal pain.  GENITOURINARY: No dysuria or hematuria.  HEMATOLOGIC: No easy bruising or bleeding.  SKIN: No rash or lesions.  MUSCULOSKELETAL: No joint pains and aches.  NEUROLOGIC:  numbness in any part of the body.   PHYSICAL EXAMINATION:  GENERAL: This is a thin-built female lying down in the bed in mild distress. The patient is able to speak full sentences.  VITAL SIGNS: Temperature , pulse 85, blood pressure 142/65, respiratory rate of 24, oxygen saturation 100% on BiPAP.  HEENT: Head normocephalic, atraumatic. There is no scleral icterus. Conjunctivae normal. Pupils equal and react to light. Extraocular movements are intact. Mucous  membranes moist. No pharyngeal erythema.  NECK: Supple. No lymphadenopathy. No JVD. No carotid bruit, No thyromegaly.  CHEST: Has no focal tenderness. Bilateral diffuse wheezing.  HEART: S1, S2 regular. No murmurs are heard.  ABDOMEN: Bowel sounds present. Soft, nontender, nondistended. No hepatosplenomegaly.  EXTREMITIES: No pedal edema. Pulses 2+.  SKIN: No rash or lesions.  MUSCULOSKELETAL: Good range of motion in all of the extremities.   LABORATORY DATA: ABG: PH of 7.47, pCO2 of 36, pO2 of 169. Troponin  less than 0.02. Coag profile is well within normal limits. CBC: WBC of 17, hemoglobin 12.9, platelet count of 199. CMP: Potassium 3.4. The rest of all of the values are within normal limits.   ASSESSMENT AND PLAN: Kristin [name] is a 69 year old female who was discharged today after being treated for chronic obstructive pulmonary disease exacerbation. Comes in with severe shortness of breath.  1. Chronic obstructive pulmonary disease/asthma exacerbation: Most likely, this is exacerbated by the pollen at home. Admit the patient to the stepdown unit. Continue with the breathing treatments, Solu-Medrol. Continue with levofloxacin.  2. Hypertension: Currently well controlled. Continue with the losartan.  3. Diabetes mellitus: Continue with Lantus. Expect the patient's blood sugars to be poorly controlled. Consider increasing the Lantus while the patient is on steroids.  4. Debility: Will involve physical therapy and occupational therapy.  5. Keep the patient on deep vein thrombosis prophylaxis with Lovenox.   TIME SPENT: 50 minutes.    ____________________________ Monica Becton, MD pv:gb D: 06/06/2013 22:21:41 ET T: 06/06/2013 22:54:49 ET JOB#: [id number]  cc: Monica Becton, MD, <Dictator> Trish Fountain, MD Grier Mitts Priya Matsen MD ELECTRONICALLY SIGNED 06/30/2013 0:19

## 2014-07-08 NOTE — Consult Note (Signed)
PATIENT NAME:  Kristin [name], Kristin [name] MR#:  [id number] DATE OF BIRTH:  1945/11/08  DATE OF CONSULTATION:  06/09/2013  CONSULTING PHYSICIAN:  Holland Kotter E. Raul Del, MD  REASON FOR CONSULTATION: Increased shortness of breath.   HISTORY OF PRESENT ILLNESS: Kristin [name] is a 69 year old lady who is a patient of ours last seen by [place] in [facility] on 05/20/2013 and known to have a stage II COPD. That is her baseline.   FEV1 on 09/28/2012 was 1.26, 52% of predicted. FVC was 3.02, 100% of predicted. FEV1 ratio was 42. At the time when we saw her, she was using her inhaler more than 2 to 3 times a day. This is on Advair as well as Spiriva. Unfortunately, she still continues to smoke. She has a heightened presence of anxiety. At that time, we put her on a prednisone tapering dose and suggested if her wheezing and shortness of breath continued, that we are going to add theophylline. She is also on nocturnal oxygen.   Education for nicotine cessation was given. She had been on BREO ELLIPTICA  in the past, and I did not see that has helped. Has not tried Daliresp that I am aware of. She is not having any chest pain, palpitations, syncope, , leg edema or calf pain.   Echo from 01/19/2012 showed an ejection fraction of 55% to 60% with a slight elevation of right ventricular systolic pressure at 36 mmHg. Did have trace mitral valve as well as  tricuspid insufficiency. Her baseline alpha-1 antitrypsin level was 130 mg/dL.  Phenotype was MM. Presently, she is sitting in the bed with her daughter (who is her caregiver) at her side, complaining of shortness of breath, somewhat better since she was here last, but very significant dyspnea on exertion with minimal activity.   The patient was admitted to the hospital 3 days prior to this admission and was discharged home and bounced right back in less than 24 hours. Details are on the chart.   PAST MEDICAL HISTORY:  1.  Hypertension.  2.  Type 2 diabetes.  3.  COPD, stage 2.   4.  Chronic anxiety and depression.  5.  Hyperlipidemia.  6.  Hypothyroidism.  7.  Esophageal stricture.  8.  Somatic carotid artery disease.   PAST SURGICAL HISTORY:  1.  Status post pubovaginal sling, November 2002.  2.  Hysterectomy.  3.  Tonsillectomy.  4.  Carpal tunnel surgery.  5.  Status post cholecystectomy, 2013. 6.  Status post cath with normal coronary artery disease.   ALLERGIES: GLUCOPHAGE, CODEINE, GI UPSET, MORPHINE GI UPSET.   CURRENT MEDICATIONS: Protonix 40 mg daily, Advair 250/50 one puff b.i.d., albuterol via nebulizer treatment q.4-6 hours p.r.n., potassium gluconate 550, one tablet daily, stool softener p.r.n., vitamin B12 at 1000 mcg one tablet by mouth daily, glyburide 4 mg one tablet daily, aspirin 81 mg daily, fenofibrate 160 mg daily, atorvastatin 40 mg daily, levothyroxine 100 mcg  daily, lovastatin 50 mg daily, furosemide 40 mg daily, trazodone 100 mg at bedtime, clonazepam 1 mg b.i.d.,  Phenergan 25 mg q.6 hours p.r.n., Lantus Solostar insulin pen 100 units,10 units subcu at bedtime, Ventolin 2 puffs q.6 hours p.r.n.   SOCIAL HISTORY: Married, has 2 children. Smokes a pack of cigarettes every 2 to 3 days. Does not drink any alcoholic beverages.   FAMILY HISTORY: Noted for migraine headaches, thyroid disease, diabetes, hypertension, coronary artery disease, asthma. Lung cancer and ovarian cancer, father and mother,  respectively.   REVIEW OF SYSTEMS:  CONSTITUTIONAL: No headache, dizziness, passing out spells, choking spells. No change in vision or hearing loss. No tinnitus, no sore throat, change in voice, neck pain, stiffness, parotid gland enlargement.  RESPIRATORY: As per above. No pleurisy, hemoptysis.  CARDIOVASCULAR: No unstable angina, palpitations or syncope.  ABDOMEN: No significant girth.  GASTROINTESTINAL: Nausea, vomiting, diarrhea, blood in stool, dysuria, flank pain, rashes.  ENDOCRINE: Polyphagia, polydipsia, heat or cold intolerance. Nor is  she having any nonhealing ulcers, hair loss, lymph nodes in the neck or supraclavicular area. Weak globally but no focal deficits.  PSYCHIATRIC: Alert, oriented anxious.   PHYSICAL EXAMINATION:  VITAL SIGNS: Afebrile, pulse 96, respirations 20.  GENERAL: A lady sitting in a chair semierect, nasal cannula O2 on, at times fanning face.  HEENT: Normocephalic, nontraumatic. Extraocular muscles are intact. No oral thrush.   NECK: Supple. No JVD. No subcutaneous emphysema.  LUNGS: Bilateral breath sounds, somewhat tight with rare expiratory wheeze as well as coarseness noted.  CARDIAC: Regular rhythm. No ectopy ABDOMEN: Soft, nontender. No epigastric soreness. No rigidity.  EXTREMITIES: No significant edema, cyanosis, Homans signs.  SKIN: No rashes, lymph nodes, TIAs, seizures, telangiectasia, alopecia.  LYMPH: No nodes in neck or supraclavicular area.  NEUROLOGIC: Cranial nerves, motor, sensory intact.  PSYCHIATRIC: Appropriate affect. Alert and oriented.   LABORATORIES AND DIAGNOSTICS: Chest CT scan showed significant emphysema,  No pulmonary embolism noted.   Her creatinine was 1.9. Electrolytes were fine. Cardiac enzymes were flat. WBC 9.5, hemoglobin was 11.5.   IMPRESSION: This is a pleasant 69 year old lady known to have stage II chronic obstructive pulmonary disease with a heightened presence of anxiety who comes in less than 1 day after being discharged from the hospital with what looks like a chronic obstructive pulmonary disease exacerbation. No signs of pulmonary embolism. Does not have a cardiac cause. Certainly, as already stated, anxiety is also playing a significant role here.   There were no obvious triggers in the environment that could have precipitate when she went home. Also she says that her husband sometimes comes in with a strong sense of diesel and other fumes on his clothing. Question if this could have been a trigger.   RECOMMENDATIONS AND PLAN: Basically to continue as  is. I would like to continue Advair, Spiriva, albuterol via nebulizer treatments, Solu-Medrol, and empiric antibiotics. Palliative should also see her.   Continue her anxiety medications but hold for sedation. If her wheezing and chest tightness persists, may consider adding theophylline versus Daliresp. We will reassess tomorrow.   Thank you for the referral.   ____________________________ Emoree Sasaki E. Raul Del, MD hef:np D: 06/09/2013 19:55:25 ET T: 06/09/2013 21:13:41 ET JOB#: [id number]  cc: Gael Londo E. Raul Del, MD, <Dictator> Erby Pian MD ELECTRONICALLY SIGNED 06/10/2013 7:21

## 2014-07-08 NOTE — Discharge Summary (Signed)
PATIENT NAME:  Kristin [name], Kristin [name] MR#:  [id number] DATE OF BIRTH:  Aug 04, 1945  DATE OF ADMISSION:  06/04/2013 DATE OF DISCHARGE:  06/06/2013  PRIMARY CARE PHYSICIAN: Keith Rake, MD  DISCHARGE DIAGNOSES: 1. Acute chronic obstructive pulmonary disease exacerbation.  2. Acute-on-chronic respiratory failure.  3. Tobacco abuse.  4. Hypertension, diabetes, hyperlipidemia.   CONDITION: Stable.   CODE STATUS: Full code.   HOME MEDICATIONS: Please refer to the medication reconciliation list. Will continue the patient's home oxygen 2 liters by nasal cannula.   DIET: Low-sodium, low-fat, low-cholesterol, ADA diet.   ACTIVITY: As tolerated.   FOLLOWUP CARE: Follow up with PCP within 1 to 2 weeks.   REASON FOR ADMISSION: Shortness of breath, wheezing.   HOSPITAL COURSE: The patient is a 69 year old Caucasian female with a history of COPD, chronic respiratory failure on home oxygen, 2 liters, who presented to the ED with shortness of breath, cough, and wheezing. For detailed history and physical examination, please refer to the admission note dictated by Dr. Benjie Karvonen. Chest x-ray did not show any cardiopulmonary disease. After admission, the patient has been treated with nebulizers, Solu-Medrol, and Zithromax. The patient's symptoms have much improved. She has no complaints. Lung sounds are clear. Vital signs are stable. She is clinically stable and will be discharged to home today. I discussed the patient's discharge plan with the patient, nurse, case manager.   TIME SPENT: About 35 minutes.    ____________________________ Demetrios Loll, MD qc:lm D: 06/06/2013 16:35:00 ET T: 06/06/2013 21:09:00 ET JOB#: [id number]  cc: Demetrios Loll, MD, <Dictator> Demetrios Loll MD ELECTRONICALLY SIGNED 06/10/2013 16:10

## 2014-07-08 NOTE — Discharge Summary (Signed)
PATIENT NAME:  Kristin [name], Kristin [name] MR#:  [id number] DATE OF BIRTH:  [birth year]  DATE OF ADMISSION:  06/06/2013  DATE OF DISCHARGE:  06/10/2013  DISCHARGE DIAGNOSIS: Acute on chronic respiratory failure, likely due to chronic obstructive pulmonary disease exacerbation.   SECONDARY DIAGNOSES: 1.  Chronic obstructive pulmonary disease, on 2 liters oxygen. 2.  Hypothyroidism. 3.  Hypertension.  4.  Diabetes. 5.  Vertigo.   CONSULTATIONS: 1.  Pulmonary, Dr. Raul Del.  2.  Palliative Care, Dr. Ermalinda Memos.   PROCEDURES/RADIOLOGY:  Chest x-ray on March 23 showed no active disease.   CT scan of the chest with PE protocol on March 24 was negative for any acute PE. No acute pulmonary abnormalities seen. Centrilobular emphysematous changes seen, with some progression.   HISTORY AND SHORT HOSPITAL COURSE: The patient is a 69 year old female with the above-mentioned medical problem, who was admitted for acute on chronic respiratory failure, likely due to COPD exacerbation. Please see Dr. Ward Givens  dictated History and Physical for further details. Her breathing improved, was very slow improvement. Had a Pulmonary consultation obtained with Dr. Raul Del, along with Palliative Care consultation with symptomatic management. Her trazodone dose was increased to 150 mg, along with continuing her clonazepam, which seemed to help her. She was feeling better, was close to her baseline. Was discharged home on  March 27 in stable condition. On the day of discharge vital signs were as follows:  Temperature 97.9, heart rate 66 per minute, respirations 20 per minute, blood pressure 131/72, she was saturating 97% on 2 to 3 liters oxygen via nasal cannula.   PHYSICAL EXAMINATION ON THE DAY OF DISCHARGE:  CARDIOVASCULAR: S1, S2 normal. No murmurs or rubs.  LUNGS: Clear to auscultation bilaterally. No wheezing, rales, rhonchi, or crepitation.  ABDOMEN: Soft, benign.  NEUROLOGIC:  Nonfocal examination.  All other physical  examination remained at baseline.   DISCHARGE MEDICATIONS: 1.  Trazodone 150 mg p.o. at bedtime. 2.  Potassium gluconate 550 mg p.o. daily. 3.  Vitamin B12, 1000 mcg p.o. daily. 4.  Glimepiride 2 mg p.o. b.i.d.  5.  Levothyroxine 100 mcg p.o. daily.  6.  Losartan 50 mg p.o. daily.  7.  Clonazepam 1 mg p.o. b.i.d.  8.  Promethazine 25 mg p.o. b.i.d. as needed.  9.  Insulin Lantus 10 units subcu daily as needed.  10.  Ventolin HFA 2 puffs inhaled every 6 hours as needed.  11.  Aspirin 81 mg 2 tablets p.o. daily.  12.  Atorvastatin 40 mg p.o. daily.  13.  Phillips stool softener once daily.  14.  Fenofibrate 160 mg p.o. daily.  15.  Lasix 40 mg p.o. daily.  16.  Protonix 40 mg p.o. b.i.d.  17.  Spiriva once daily.  18.  Ferrous sulfate 325 mg p.o. daily. 19.  Benzonatate capsule 3 times a day as needed.  20.  Advair 250/50, 1 puff inhaled daily.  21.  Prednisone 60 mg p.o. daily, taper 10 mg daily until finished.   DISCHARGE DIET: Low sodium.   DISCHARGE ACTIVITY:  As tolerated.   DISCHARGE INSTRUCTIONS AND FOLLOW UP:  The patient was instructed to follow up with her primary care physician, Dr. Keith Rake in 1 to 2 weeks. She will need follow up with Pulmonary, Dr. Raul Del, in 2 to 4 weeks. She was recommended to continue her 2 to 3 liters oxygen via nasal cannula continuous at home, which is her baseline oxygen requirement. She will also need outpatient follow up at pulmonary rehab.   Total  time discharging this patient was 55 minutes.   ____________________________ Lucina Mellow. Manuella Ghazi, MD vss:mr D: 06/10/2013 15:40:00 ET T: 06/10/2013 21:19:33 ET JOB#: [id number]  cc: Anjuli Gemmill S. Manuella Ghazi, MD, <Dictator> [facility] Myrla Halsted, MD Herbon E. Raul Del, MD Efraim Kaufmann, MD    Lucina Mellow [facility] MD ELECTRONICALLY SIGNED 06/12/2013 21:04

## 2014-07-09 NOTE — Op Note (Signed)
PATIENT NAME:  Kristin [name], PARROTT MR#:  [id number] DATE OF BIRTH:  Aug 20, 1945  DATE OF PROCEDURE:  08/15/2011  OPERATION PERFORMED: Laparoscopic cholecystectomy.   PREOPERATIVE DIAGNOSIS: Symptomatic cholelithiasis.   POSTOPERATIVE DIAGNOSIS: Symptomatic cholelithiasis.   SURGEON: Consuela Mimes, MD  ANESTHESIA: General.   PROCEDURE IN DETAIL: The patient was placed supine on the Operating Room table and prepped and draped in the usual sterile fashion. A 15 mmHg CO2 pneumoperitoneum was created via a Veress needle in the infraumbilical position and this was replaced with a 5 mm trocar and a 25 degree angled scope. Remaining trocars were placed under direct visualization. The fundus of the gallbladder was retracted superiorly and ventrally. There were some adhesions to the visceral surface of the gallbladder and these were taken down with the electrocautery and sharply as some of them were very close to the duodenum. The infundibulum of the gallbladder was retracted laterally opening up the triangle of Calot and although there was some fat within the triangle the common bile duct could be clearly visualized. The common hepatic duct could not. Dissection within the triangle of Calot revealed a small cystic duct which was doubly clipped and divided, a small cystic artery which was doubly clipped and divided, a single posterior cystic artery which was singly clipped and divided, and the gallbladder was then removed from the liver bed with electrocautery. There was a single very small ductular structure going from the liver bed to the gallbladder that was thought possibly to represent a duct of Luschka and a single clip was placed on this. The gallbladder was then placed in an Endo Catch bag and extracted from the abdomen via the epigastric port. This port site fascia was closed with a 0 Vicryl suture using the laparoscopic puncture closure device and the peritoneum was then desufflated and decannulated  and all four skin sites were closed with subcuticular 5-0 Monocryl and suture strips. The patient tolerated the procedure well. There were no complications.  ____________________________ Consuela Mimes, MD wfm:cms D: 08/15/2011 11:18:03 ET T: 08/15/2011 11:46:19 ET  JOB#: [id number] cc: Consuela Mimes, MD, <Dictator> Consuela Mimes MD ELECTRONICALLY SIGNED 08/15/2011 20:28

## 2014-07-10 LAB — SURGICAL PATHOLOGY

## 2014-10-13 ENCOUNTER — Emergency Department
Admission: EM | Admit: 2014-10-13 | Discharge: 2014-10-13 | Disposition: A | Discharge status: Home / Self Care | Payer: Medicare Other | Attending: Emergency Medicine | Admitting: Emergency Medicine

## 2014-10-13 ENCOUNTER — Emergency Department: Payer: Medicare Other

## 2014-10-13 DIAGNOSIS — R52 Pain, unspecified: Secondary | ICD-10-CM

## 2014-10-13 DIAGNOSIS — S99911A Unspecified injury of right ankle, initial encounter: Secondary | ICD-10-CM | POA: Diagnosis present

## 2014-10-13 DIAGNOSIS — Y9389 Activity, other specified: Secondary | ICD-10-CM | POA: Insufficient documentation

## 2014-10-13 DIAGNOSIS — Y9289 Other specified places as the place of occurrence of the external cause: Secondary | ICD-10-CM | POA: Insufficient documentation

## 2014-10-13 DIAGNOSIS — X58XXXA Exposure to other specified factors, initial encounter: Secondary | ICD-10-CM | POA: Diagnosis not present

## 2014-10-13 DIAGNOSIS — Y998 Other external cause status: Secondary | ICD-10-CM | POA: Diagnosis not present

## 2014-10-13 DIAGNOSIS — S93401A Sprain of unspecified ligament of right ankle, initial encounter: Secondary | ICD-10-CM | POA: Diagnosis not present

## 2014-10-13 MED ORDER — TRAMADOL HCL 50 MG PO TABS: 50.0000 mg | ORAL_TABLET | Freq: Four times a day (QID) | ORAL | Status: DC | PRN

## 2014-10-13 MED ORDER — OXYCODONE HCL 5 MG PO TABS
5.0000 mg | ORAL_TABLET | Freq: Once | ORAL | Status: AC
  Administered 2014-10-13: 5 mg via ORAL
  Filled 2014-10-13: qty 1

## 2014-10-13 NOTE — ED Notes (Signed)
This am twisted right ankle has swelling, ice applied

## 2014-10-13 NOTE — ED Notes (Signed)
Pt states she was standing from her recliner and twisted her right ankle and is unable to bare wt.

## 2014-10-13 NOTE — ED Provider Notes (Signed)
[facility] Provider Note  ____________________________________________  Time seen: Approximately 2:25 PM  I have reviewed the triage vital signs and the nursing notes.   HISTORY  Chief Complaint Ankle Pain   HPI Kristin [name] is a 69 y.o. female who presents to the emergency department for right ankle pain. She was trying to stand up out of her recliner while holding a bowl of cereal and lost her balance. She was unable to catch herself and twisted her right ankle. She reports intense pain with weight bearing.  No past medical history on file.  There are no active problems to display for this patient.   No past surgical history on file.  Current Outpatient Rx  Name  Route  Sig  Dispense  Refill  . traMADol (ULTRAM) 50 MG tablet   Oral   Take 1 tablet (50 mg total) by mouth every 6 (six) hours as needed.   12 tablet   0     Allergies Codeine and Morphine and related  No family history on file.  Social History History  Substance Use Topics  . Smoking status: Never Smoker   . Smokeless tobacco: Never Used  . Alcohol Use: No    Review of Systems Constitutional: No recent illness. Eyes: No visual changes. ENT: No sore throat. Cardiovascular: Denies chest pain or palpitations. Respiratory: Denies shortness of breath. Gastrointestinal: No abdominal pain.  Genitourinary: Negative for dysuria. Musculoskeletal: Pain in right ankle and right lateral knee. Skin: Negative for rash. Neurological: Negative for headaches, focal weakness or numbness. 10-point ROS otherwise negative.  ____________________________________________   PHYSICAL EXAM:  VITAL SIGNS: ED Triage Vitals  Enc Vitals Group     BP 10/13/14 1256 146/55 mmHg     Pulse Rate 10/13/14 1256 82     Resp 10/13/14 1256 18     Temp 10/13/14 1256 97.5 F (36.4 C)     Temp Source 10/13/14 1256 Oral     SpO2 10/13/14 1256 95 %     Weight 10/13/14  1256 125 lb (56.7 kg)     Height 10/13/14 1256 5\' 4"  (1.626 m)     Head Cir --      Peak Flow --      Pain Score 10/13/14 1303 8     Pain Loc --      Pain Edu? --      Excl. in [place]? --     Constitutional: Alert and oriented. Well appearing and in no acute distress. Eyes: Conjunctivae are normal. EOMI. Head: Atraumatic. Nose: No congestion/rhinnorhea. Neck: No stridor.  Respiratory: Normal respiratory effort.   Musculoskeletal: Swelling noted to ATFL band. [place] ankle rules negative. Mild tenderness to lateral proximal fibula. Neurologic:  Normal speech and language. No gross focal neurologic deficits are appreciated. Speech is normal. No gait instability. Skin:  Skin is warm, dry and intact. Atraumatic. Psychiatric: Mood and affect are normal. Speech and behavior are normal.  ____________________________________________   LABS (all labs ordered are listed, but only abnormal results are displayed)  Labs Reviewed - No data to display ____________________________________________  RADIOLOGY  Negative for acute bony abnormality. Images viewed by me. ____________________________________________   PROCEDURES  Procedure(s) performed: SPLINT APPLICATION Date/Time: 2:35 PM Authorized by: Sherrie George Consent: Verbal consent obtained. Risks and benefits: risks, benefits and alternatives were discussed Consent given by: patient Splint applied TI:[id number], ER Tech Location details: right ankle Splint type: Ankle stirrup Supplies used: Velcro Ankle Stirrup Splint Post-procedure: The splinted body  part was neurovascularly unchanged following the procedure. Patient tolerance: Patient tolerated the procedure well with no immediate complications.      ____________________________________________   INITIAL IMPRESSION / ASSESSMENT AND PLAN / ED COURSE  Pertinent labs & imaging results that were available during my care of the patient were reviewed by me and considered in my  medical decision making (see chart for details).  Tramadol Rx. Given . Encouraged follow up with orthopedics if not improving over the week. She was advised to return to the emergency department for symptoms that change or worsen if unable to see the specialist. ____________________________________________   FINAL CLINICAL IMPRESSION(S) / ED DIAGNOSES  Final diagnoses:  Ankle sprain, right, initial encounter       Victorino Dike, FNP 10/13/14 1813  Harvest Dark, MD 10/13/14 2118

## 2015-05-25 ENCOUNTER — Encounter: Payer: Self-pay | Admitting: *Deleted

## 2015-08-03 ENCOUNTER — Other Ambulatory Visit: Payer: Self-pay | Admitting: Internal Medicine

## 2015-08-03 DIAGNOSIS — R1031 Right lower quadrant pain: Secondary | ICD-10-CM

## 2015-08-10 ENCOUNTER — Ambulatory Visit
Admission: RE | Admit: 2015-08-10 | Discharge: 2015-08-10 | Disposition: A | Discharge status: Home / Self Care | Payer: Medicare Other | Source: Ambulatory Visit | Attending: Internal Medicine | Admitting: Internal Medicine

## 2015-08-10 DIAGNOSIS — I700 Atherosclerosis of aorta: Secondary | ICD-10-CM | POA: Insufficient documentation

## 2015-08-10 DIAGNOSIS — N3090 Cystitis, unspecified without hematuria: Secondary | ICD-10-CM | POA: Diagnosis not present

## 2015-08-10 DIAGNOSIS — R1031 Right lower quadrant pain: Secondary | ICD-10-CM | POA: Insufficient documentation

## 2015-08-10 DIAGNOSIS — I313 Pericardial effusion (noninflammatory): Secondary | ICD-10-CM | POA: Insufficient documentation

## 2015-08-10 HISTORY — DX: Malignant (primary) neoplasm, unspecified: C80.1

## 2015-08-10 HISTORY — DX: Type 2 diabetes mellitus without complications: E11.9

## 2015-08-10 HISTORY — DX: Essential (primary) hypertension: I10

## 2015-08-10 HISTORY — DX: Unspecified asthma, uncomplicated: J45.909

## 2015-08-10 LAB — POCT I-STAT CREATININE: Creatinine, Ser: 1.1 mg/dL — ABNORMAL HIGH (ref 0.44–1.00)

## 2015-08-10 MED ORDER — IOPAMIDOL (ISOVUE-300) INJECTION 61%
85.0000 mL | Freq: Once | INTRAVENOUS | Status: AC | PRN
  Administered 2015-08-10: 85 mL via INTRAVENOUS

## 2015-09-07 DIAGNOSIS — F324 Major depressive disorder, single episode, in partial remission: Secondary | ICD-10-CM | POA: Diagnosis present

## 2015-10-22 ENCOUNTER — Emergency Department: Payer: Medicare Other

## 2015-10-22 ENCOUNTER — Encounter: Payer: Self-pay | Admitting: Emergency Medicine

## 2015-10-22 ENCOUNTER — Observation Stay
Admission: EM | Admit: 2015-10-22 | Discharge: 2015-10-24 | Disposition: A | Discharge status: Home / Self Care | Payer: Medicare Other | Attending: Internal Medicine | Admitting: Internal Medicine

## 2015-10-22 DIAGNOSIS — R531 Weakness: Secondary | ICD-10-CM | POA: Insufficient documentation

## 2015-10-22 DIAGNOSIS — R748 Abnormal levels of other serum enzymes: Secondary | ICD-10-CM | POA: Insufficient documentation

## 2015-10-22 DIAGNOSIS — I10 Essential (primary) hypertension: Secondary | ICD-10-CM | POA: Insufficient documentation

## 2015-10-22 DIAGNOSIS — J441 Chronic obstructive pulmonary disease with (acute) exacerbation: Secondary | ICD-10-CM

## 2015-10-22 DIAGNOSIS — Z885 Allergy status to narcotic agent status: Secondary | ICD-10-CM | POA: Diagnosis not present

## 2015-10-22 DIAGNOSIS — Z9981 Dependence on supplemental oxygen: Secondary | ICD-10-CM | POA: Diagnosis not present

## 2015-10-22 DIAGNOSIS — Y92009 Unspecified place in unspecified non-institutional (private) residence as the place of occurrence of the external cause: Secondary | ICD-10-CM

## 2015-10-22 DIAGNOSIS — N179 Acute kidney failure, unspecified: Secondary | ICD-10-CM | POA: Diagnosis not present

## 2015-10-22 DIAGNOSIS — N390 Urinary tract infection, site not specified: Secondary | ICD-10-CM | POA: Diagnosis present

## 2015-10-22 DIAGNOSIS — E039 Hypothyroidism, unspecified: Secondary | ICD-10-CM | POA: Insufficient documentation

## 2015-10-22 DIAGNOSIS — E785 Hyperlipidemia, unspecified: Secondary | ICD-10-CM | POA: Diagnosis not present

## 2015-10-22 DIAGNOSIS — Z981 Arthrodesis status: Secondary | ICD-10-CM | POA: Diagnosis not present

## 2015-10-22 DIAGNOSIS — J449 Chronic obstructive pulmonary disease, unspecified: Secondary | ICD-10-CM | POA: Diagnosis not present

## 2015-10-22 DIAGNOSIS — R55 Syncope and collapse: Principal | ICD-10-CM | POA: Diagnosis present

## 2015-10-22 DIAGNOSIS — W19XXXA Unspecified fall, initial encounter: Secondary | ICD-10-CM | POA: Diagnosis not present

## 2015-10-22 DIAGNOSIS — E119 Type 2 diabetes mellitus without complications: Secondary | ICD-10-CM | POA: Insufficient documentation

## 2015-10-22 DIAGNOSIS — S0093XA Contusion of unspecified part of head, initial encounter: Secondary | ICD-10-CM

## 2015-10-22 LAB — COMPREHENSIVE METABOLIC PANEL
ALBUMIN: 4.4 g/dL (ref 3.5–5.0)
ALK PHOS: 35 U/L — AB (ref 38–126)
ALT: 12 U/L — ABNORMAL LOW (ref 14–54)
ANION GAP: 10 (ref 5–15)
AST: 18 U/L (ref 15–41)
BILIRUBIN TOTAL: 0.9 mg/dL (ref 0.3–1.2)
BUN: 26 mg/dL — AB (ref 6–20)
CO2: 23 mmol/L (ref 22–32)
Calcium: 9.8 mg/dL (ref 8.9–10.3)
Chloride: 103 mmol/L (ref 101–111)
Creatinine, Ser: 1.71 mg/dL — ABNORMAL HIGH (ref 0.44–1.00)
GFR calc Af Amer: 34 mL/min — ABNORMAL LOW (ref >60)
GFR calc non Af Amer: 29 mL/min — ABNORMAL LOW (ref >60)
GLUCOSE: 85 mg/dL (ref 65–99)
POTASSIUM: 4.3 mmol/L (ref 3.5–5.1)
SODIUM: 136 mmol/L (ref 135–145)
TOTAL PROTEIN: 7 g/dL (ref 6.5–8.1)

## 2015-10-22 LAB — TROPONIN I: Troponin I: 0.05 ng/mL (ref <0.03)

## 2015-10-22 LAB — CBC
HCT: 33 % — ABNORMAL LOW (ref 35.0–47.0)
HEMOGLOBIN: 11.5 g/dL — AB (ref 12.0–16.0)
MCH: 32.6 pg (ref 26.0–34.0)
MCHC: 34.8 g/dL (ref 32.0–36.0)
MCV: 93.8 fL (ref 80.0–100.0)
Platelets: 213 10*3/uL (ref 150–440)
RBC: 3.52 MIL/uL — AB (ref 3.80–5.20)
RDW: 13.5 % (ref 11.5–14.5)
WBC: 7.6 10*3/uL (ref 3.6–11.0)

## 2015-10-22 LAB — URINALYSIS COMPLETE WITH MICROSCOPIC (ARMC ONLY)
Bilirubin Urine: NEGATIVE
Glucose, UA: NEGATIVE mg/dL
KETONES UR: NEGATIVE mg/dL
Nitrite: POSITIVE — AB
PH: 5 (ref 5.0–8.0)
PROTEIN: NEGATIVE mg/dL
SPECIFIC GRAVITY, URINE: 1.008 (ref 1.005–1.030)
TRANS EPITHEL UA: 1

## 2015-10-22 LAB — GLUCOSE, CAPILLARY: Glucose-Capillary: 108 mg/dL — ABNORMAL HIGH (ref 65–99)

## 2015-10-22 LAB — MAGNESIUM: Magnesium: 1.3 mg/dL — ABNORMAL LOW (ref 1.7–2.4)

## 2015-10-22 MED ORDER — ACETAMINOPHEN 500 MG PO TABS
1000.0000 mg | ORAL_TABLET | Freq: Once | ORAL | Status: AC
  Administered 2015-10-22: 1000 mg via ORAL
  Filled 2015-10-22: qty 2

## 2015-10-22 MED ORDER — IPRATROPIUM-ALBUTEROL 0.5-2.5 (3) MG/3ML IN SOLN
3.0000 mL | Freq: Once | RESPIRATORY_TRACT | Status: AC
  Administered 2015-10-22: 3 mL via RESPIRATORY_TRACT
  Filled 2015-10-22: qty 3

## 2015-10-22 MED ORDER — MAGNESIUM SULFATE 2 GM/50ML IV SOLN
2.0000 g | Freq: Once | INTRAVENOUS | Status: AC
  Administered 2015-10-22: 2 g via INTRAVENOUS
  Filled 2015-10-22: qty 50

## 2015-10-22 MED ORDER — ALBUTEROL SULFATE (2.5 MG/3ML) 0.083% IN NEBU
5.0000 mg | INHALATION_SOLUTION | Freq: Once | RESPIRATORY_TRACT | Status: AC
  Administered 2015-10-22: 5 mg via RESPIRATORY_TRACT
  Filled 2015-10-22: qty 6

## 2015-10-22 MED ORDER — METOCLOPRAMIDE HCL 5 MG/ML IJ SOLN
10.0000 mg | Freq: Once | INTRAMUSCULAR | Status: AC
  Administered 2015-10-22: 10 mg via INTRAVENOUS
  Filled 2015-10-22: qty 2

## 2015-10-22 MED ORDER — DIPHENHYDRAMINE HCL 50 MG/ML IJ SOLN
12.5000 mg | Freq: Once | INTRAMUSCULAR | Status: AC
  Administered 2015-10-22: 12.5 mg via INTRAVENOUS
  Filled 2015-10-22: qty 1

## 2015-10-22 MED ORDER — ASPIRIN 81 MG PO CHEW
324.0000 mg | CHEWABLE_TABLET | Freq: Once | ORAL | Status: AC
  Administered 2015-10-22: 324 mg via ORAL
  Filled 2015-10-22: qty 4

## 2015-10-22 MED ORDER — DEXTROSE 5 % IV SOLN
1.0000 g | Freq: Once | INTRAVENOUS | Status: AC
  Administered 2015-10-22: 1 g via INTRAVENOUS
  Filled 2015-10-22: qty 10

## 2015-10-22 MED ORDER — METHYLPREDNISOLONE SODIUM SUCC 125 MG IJ SOLR
125.0000 mg | Freq: Once | INTRAMUSCULAR | Status: AC
Start: 2015-10-22 — End: 2015-10-22
  Administered 2015-10-22: 125 mg via INTRAVENOUS
  Filled 2015-10-22: qty 2

## 2015-10-22 NOTE — ED Triage Notes (Signed)
Pt reports having a fall on this past Friday, does not remember falling. Husband found her on the floor after the fall. Pt reports nausea, headaches and some memory impairment since the fall. Pt noted to have hematoma on left side of  Face.

## 2015-10-22 NOTE — ED Provider Notes (Signed)
[facility] Emergency Department Provider Note    First MD Initiated Contact with Patient 10/22/15 1820     (approximate)  I have reviewed the triage vital signs and the nursing notes.   HISTORY  Chief Complaint Fall (headache)    HPI Kristin [name] is a 70 y.o. female who presents status post fall Friday night with acute headache and nausea. Patient states that she was in bed with her husband and got up to use the bathroom and had a fall and hit her head. Patient is amnestic to any other events and cannot recall if she had any chest pain or shortness of breath prior to the event. No prolonged loss of consciousness reported by the husband. Since then she's been denying any chest pain or shortness of breath out of her chronic COPD.  She presents to the ED today for worsening left-sided headache and nausea that is been gradually worsening over the past 3 days. Denies any numbness or tingling. She's been able to ambulate at her baseline. Denies any visual changes she takes a daily aspirin.  She states that she's also been treated with antibiotics most recently Augmentin for a UTI for the past 5 days for [facility] of the symptoms are not improving.   Past Medical History:  Diagnosis Date  . Asthma   . 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 Diabetes mellitus without complication ([place])   . Hypertension     There are no active problems to display for this patient.   History reviewed. No pertinent surgical history.  Prior to Admission medications   Medication Sig Start Date End Date Taking? Authorizing Provider  traMADol (ULTRAM) 50 MG tablet Take 1 tablet (50 mg total) by mouth every 6 (six) hours as needed. 10/13/14   Victorino Dike, FNP    Allergies Codeine and Morphine and related  No family history on file.  Social History Social History  Substance Use Topics  . Smoking status: Never Smoker  . Smokeless tobacco: Never Used  . Alcohol use No      Review of Systems Patient denies headaches, rhinorrhea, blurry vision, numbness, shortness of breath, chest pain, edema, cough, abdominal pain, nausea, vomiting, diarrhea, dysuria, fevers, rashes or hallucinations unless otherwise stated above in HPI. ____________________________________________   PHYSICAL EXAM:  VITAL SIGNS: Vitals:   10/22/15 2111 10/22/15 2112  BP: (!) 121/59 (!) 121/59  Pulse: 98 72  Resp: 18 16  Temp:      Constitutional: Alert and oriented. Chronically ill appearing. Eyes: Conjunctivae are normal. PERRL. EOMI. Head: Atraumatic. Nose: No congestion/rhinnorhea. Mouth/Throat: Mucous membranes are moist.  Oropharynx non-erythematous. Neck: No stridor. Painless ROM. No cervical spine tenderness to palpation Hematological/Lymphatic/Immunilogical: No cervical lymphadenopathy. Cardiovascular: Normal rate, regular rhythm. Grossly normal heart sounds.  Good peripheral circulation.  Respiratory: increased respiratory rate, no respiratory distress, prolonged expiratory phase Gastrointestinal: Soft and nontender. No distention. No abdominal bruits. No CVA tenderness.  Musculoskeletal: No lower extremity tenderness nor edema.  No joint effusions. Neurologic:  Normal speech and language. CN intact 2-12 No gross focal neurologic deficits are appreciated. No gait instability. Skin:  Skin is warm, dry and intact. No rash noted.  ____________________________________________   LABS (all labs ordered are listed, but only abnormal results are displayed)  Results for orders placed or performed during the hospital encounter of 10/22/15 (from the past 24 hour(s))  Glucose, capillary     Status: Abnormal   Collection Time: 10/22/15  6:27  PM  Result Value Ref Range   Glucose-Capillary 108 (H) 65 - 99 mg/dL   ____________________________________________  EKG  Time: 17:31  Indication: syncope  Rate: 75, NSR  Axis: normal Other: non specifc ST  changes ____________________________________________  RADIOLOGY  CT head with NAICA ____________________________________________   PROCEDURES  Procedure(s) performed: none    Critical Care performed: no ____________________________________________   INITIAL IMPRESSION / ASSESSMENT AND PLAN / ED COURSE  Pertinent labs & imaging results that were available during my care of the patient were reviewed by me and considered in my medical decision making (see chart for details).  DDX: sah, sdh, iph, dysrhythmia, fracture, concussion, uti  Kristin [name] is a 70 y.o. who presents to the ED with chief complaint of a syncopal episode over the weekend with subsequent head injury as well as dysuria and shortness of breath. Patient without focal neuro deficit does have tenderness to palpation in all left scalp. CT imaging will be ordered due to concern for acute traumatic intracranial injury. Patient without signs of dysrhythmia on EKG. She is borderline hypoxic on home O2 90% and is to With decreased breath sounds. Will provide nebulized treatment. Will check labs to evaluate for syncopal episode.  Clinical Course   ----------------------------------------- 9:09 PM on 10/22/2015 -----------------------------------------  Patient with no evidence of acute intracranial injury on CT imaging. Laboratory evaluation was reviewed and she does have evidence of acute UTI. We'll provide IV antibiotics. Headache improved after IV pain medication.  Respirations improved after nebulized treatment. Noted to be hypomagnesemia. I will provide IV magnesium as well as IV steroids. Patient also with mildly elevated troponin level but denies any chest pain at this time. He is on her age and underlying comorbidities with a syncopal episode fall from standing over the weekend do feel patient will require admission for further cardiac monitoring. I have contacted the hospitalist regarding admission for further  evaluation and treatment.Have discussed with the patient and available family all diagnostics and treatments performed thus far and all questions were answered to the best of my ability. The patient demonstrates understanding and agreement with plan.   ____________________________________________   FINAL CLINICAL IMPRESSION(S) / ED DIAGNOSES  Final diagnoses:  UTI (lower urinary tract infection)  Fall at home, initial encounter  Head contusion, initial encounter  COPD with acute exacerbation ([place])  Syncope and collapse      NEW MEDICATIONS STARTED DURING THIS VISIT:  New Prescriptions   No medications on file     Note:  This document was prepared using Dragon voice recognition software and may include unintentional dictation errors.    Merlyn Lot, MD 10/22/15 2113

## 2015-10-23 ENCOUNTER — Observation Stay: Payer: Medicare Other

## 2015-10-23 ENCOUNTER — Observation Stay
Admit: 2015-10-23 | Discharge: 2015-10-23 | Disposition: A | Discharge status: Home / Self Care | Payer: Medicare Other | Attending: Family Medicine | Admitting: Family Medicine

## 2015-10-23 ENCOUNTER — Encounter: Payer: Self-pay | Admitting: Emergency Medicine

## 2015-10-23 DIAGNOSIS — R55 Syncope and collapse: Secondary | ICD-10-CM | POA: Diagnosis present

## 2015-10-23 LAB — COMPREHENSIVE METABOLIC PANEL
ALT: 12 U/L — AB (ref 14–54)
AST: 17 U/L (ref 15–41)
Albumin: 4 g/dL (ref 3.5–5.0)
Alkaline Phosphatase: 26 U/L — ABNORMAL LOW (ref 38–126)
Anion gap: 6 (ref 5–15)
BILIRUBIN TOTAL: 0.5 mg/dL (ref 0.3–1.2)
BUN: 22 mg/dL — AB (ref 6–20)
CHLORIDE: 109 mmol/L (ref 101–111)
CO2: 25 mmol/L (ref 22–32)
CREATININE: 1.27 mg/dL — AB (ref 0.44–1.00)
Calcium: 9.1 mg/dL (ref 8.9–10.3)
GFR, EST AFRICAN AMERICAN: 49 mL/min — AB (ref >60)
GFR, EST NON AFRICAN AMERICAN: 42 mL/min — AB (ref >60)
Glucose, Bld: 113 mg/dL — ABNORMAL HIGH (ref 65–99)
Potassium: 4.6 mmol/L (ref 3.5–5.1)
Sodium: 140 mmol/L (ref 135–145)
TOTAL PROTEIN: 6.6 g/dL (ref 6.5–8.1)

## 2015-10-23 LAB — GLUCOSE, CAPILLARY
GLUCOSE-CAPILLARY: 181 mg/dL — AB (ref 65–99)
GLUCOSE-CAPILLARY: 120 mg/dL — AB (ref 65–99)
GLUCOSE-CAPILLARY: 100 mg/dL — AB (ref 65–99)
Glucose-Capillary: 117 mg/dL — ABNORMAL HIGH (ref 65–99)

## 2015-10-23 LAB — CBC
HCT: 30.1 % — ABNORMAL LOW (ref 35.0–47.0)
Hemoglobin: 10.5 g/dL — ABNORMAL LOW (ref 12.0–16.0)
MCH: 32.4 pg (ref 26.0–34.0)
MCHC: 34.8 g/dL (ref 32.0–36.0)
MCV: 93 fL (ref 80.0–100.0)
PLATELETS: 209 10*3/uL (ref 150–440)
RBC: 3.23 MIL/uL — AB (ref 3.80–5.20)
RDW: 13.6 % (ref 11.5–14.5)
WBC: 6.8 10*3/uL (ref 3.6–11.0)

## 2015-10-23 LAB — TSH: TSH: 1.52 u[IU]/mL (ref 0.350–4.500)

## 2015-10-23 LAB — MAGNESIUM: MAGNESIUM: 2.2 mg/dL (ref 1.7–2.4)

## 2015-10-23 LAB — TROPONIN I: Troponin I: <0.03 ng/mL (ref <0.03)

## 2015-10-23 LAB — LIPID PANEL
CHOL/HDL RATIO: 3.2 ratio
CHOLESTEROL: 146 mg/dL (ref 0–200)
HDL: 45 mg/dL (ref >40)
LDL Cholesterol: 72 mg/dL (ref 0–99)
Triglycerides: 145 mg/dL (ref <150)
VLDL: 29 mg/dL (ref 0–40)

## 2015-10-23 LAB — HEMOGLOBIN A1C: HEMOGLOBIN A1C: 5.4 % (ref 4.0–6.0)

## 2015-10-23 LAB — PHOSPHORUS: PHOSPHORUS: 2.9 mg/dL (ref 2.5–4.6)

## 2015-10-23 MED ORDER — LEVOTHYROXINE SODIUM 112 MCG PO TABS
112.0000 ug | ORAL_TABLET | Freq: Every day | ORAL | Status: DC
  Administered 2015-10-23 – 2015-10-24 (×2): 112 ug via ORAL
  Filled 2015-10-23 (×2): qty 1

## 2015-10-23 MED ORDER — ASPIRIN EC 81 MG PO TBEC
81.0000 mg | DELAYED_RELEASE_TABLET | Freq: Every day | ORAL | Status: DC
  Administered 2015-10-23 – 2015-10-24 (×2): 81 mg via ORAL
  Filled 2015-10-23 (×2): qty 1

## 2015-10-23 MED ORDER — ACETAMINOPHEN 650 MG RE SUPP: 650.0000 mg | Freq: Four times a day (QID) | RECTAL | Status: DC | PRN

## 2015-10-23 MED ORDER — ACETAMINOPHEN 325 MG PO TABS
650.0000 mg | ORAL_TABLET | Freq: Four times a day (QID) | ORAL | Status: DC | PRN
  Administered 2015-10-23 – 2015-10-24 (×4): 650 mg via ORAL
  Filled 2015-10-23 (×4): qty 2

## 2015-10-23 MED ORDER — SODIUM CHLORIDE 0.9 % IV SOLN: INTRAVENOUS | Status: DC

## 2015-10-23 MED ORDER — IPRATROPIUM-ALBUTEROL 0.5-2.5 (3) MG/3ML IN SOLN: 3.0000 mL | Freq: Four times a day (QID) | RESPIRATORY_TRACT | Status: DC | PRN

## 2015-10-23 MED ORDER — FENOFIBRATE 160 MG PO TABS
160.0000 mg | ORAL_TABLET | Freq: Every day | ORAL | Status: DC
  Administered 2015-10-23 – 2015-10-24 (×2): 160 mg via ORAL
  Filled 2015-10-23 (×2): qty 1

## 2015-10-23 MED ORDER — ALBUTEROL SULFATE (2.5 MG/3ML) 0.083% IN NEBU: 3.0000 mL | INHALATION_SOLUTION | Freq: Four times a day (QID) | RESPIRATORY_TRACT | Status: DC | PRN

## 2015-10-23 MED ORDER — TRAZODONE HCL 50 MG PO TABS
150.0000 mg | ORAL_TABLET | Freq: Every day | ORAL | Status: DC
  Administered 2015-10-23 (×2): 150 mg via ORAL
  Filled 2015-10-23 (×2): qty 1

## 2015-10-23 MED ORDER — ENOXAPARIN SODIUM 30 MG/0.3ML ~~LOC~~ SOLN: 30.0000 mg | SUBCUTANEOUS | Status: DC

## 2015-10-23 MED ORDER — SODIUM CHLORIDE 0.9 % IV SOLN
INTRAVENOUS | Status: DC
  Administered 2015-10-23 – 2015-10-24 (×2): via INTRAVENOUS

## 2015-10-23 MED ORDER — DEXTROSE 5 % IV SOLN
1.0000 g | INTRAVENOUS | Status: DC
  Administered 2015-10-23: 1 g via INTRAVENOUS
  Filled 2015-10-23 (×2): qty 10

## 2015-10-23 MED ORDER — LOSARTAN POTASSIUM 25 MG PO TABS
50.0000 mg | ORAL_TABLET | Freq: Every day | ORAL | Status: DC
  Administered 2015-10-23: 50 mg via ORAL
  Filled 2015-10-23: qty 2

## 2015-10-23 MED ORDER — ENOXAPARIN SODIUM 40 MG/0.4ML ~~LOC~~ SOLN
40.0000 mg | SUBCUTANEOUS | Status: DC
  Administered 2015-10-23: 40 mg via SUBCUTANEOUS
  Filled 2015-10-23: qty 0.4

## 2015-10-23 MED ORDER — INSULIN ASPART 100 UNIT/ML ~~LOC~~ SOLN
0.0000 [IU] | Freq: Three times a day (TID) | SUBCUTANEOUS | Status: DC
Start: 2015-10-23 — End: 2015-10-24

## 2015-10-23 MED ORDER — ATORVASTATIN CALCIUM 20 MG PO TABS
40.0000 mg | ORAL_TABLET | Freq: Every day | ORAL | Status: DC
  Administered 2015-10-23: 40 mg via ORAL
  Filled 2015-10-23: qty 2

## 2015-10-23 MED ORDER — METOPROLOL SUCCINATE ER 25 MG PO TB24
12.5000 mg | ORAL_TABLET | Freq: Every day | ORAL | Status: DC
  Administered 2015-10-23: 12.5 mg via ORAL
  Filled 2015-10-23 (×2): qty 1

## 2015-10-23 MED ORDER — PROMETHAZINE HCL 25 MG PO TABS: 25.0000 mg | ORAL_TABLET | Freq: Four times a day (QID) | ORAL | Status: DC | PRN

## 2015-10-23 MED ORDER — SODIUM CHLORIDE 0.9% FLUSH
3.0000 mL | Freq: Two times a day (BID) | INTRAVENOUS | Status: DC
  Administered 2015-10-23 – 2015-10-24 (×3): 3 mL via INTRAVENOUS

## 2015-10-23 MED ORDER — ENOXAPARIN SODIUM 40 MG/0.4ML ~~LOC~~ SOLN: 30.0000 mg | SUBCUTANEOUS | Status: DC

## 2015-10-23 MED ORDER — PANTOPRAZOLE SODIUM 40 MG PO TBEC
40.0000 mg | DELAYED_RELEASE_TABLET | Freq: Two times a day (BID) | ORAL | Status: DC
  Administered 2015-10-23 – 2015-10-24 (×4): 40 mg via ORAL
  Filled 2015-10-23 (×4): qty 1

## 2015-10-23 MED ORDER — CLONAZEPAM 0.5 MG PO TABS
1.0000 mg | ORAL_TABLET | Freq: Every day | ORAL | Status: DC
  Administered 2015-10-23 – 2015-10-24 (×2): 1 mg via ORAL
  Filled 2015-10-23 (×2): qty 2

## 2015-10-23 MED ORDER — FUROSEMIDE 40 MG PO TABS
40.0000 mg | ORAL_TABLET | Freq: Every day | ORAL | Status: DC
  Administered 2015-10-23: 40 mg via ORAL
  Filled 2015-10-23: qty 1

## 2015-10-23 MED ORDER — MAGNESIUM SULFATE 2 GM/50ML IV SOLN
2.0000 g | Freq: Once | INTRAVENOUS | Status: AC
  Administered 2015-10-23: 2 g via INTRAVENOUS
  Filled 2015-10-23: qty 50

## 2015-10-23 NOTE — H&P (Addendum)
[place] @ [facility] Admission History and Physical Harvie Bridge, D.O.  ---------------------------------------------------------------------------------------------------------------------   PATIENT NAME: Kristin [name] MR#: [id number] DATE OF BIRTH: 06-14-1945 DATE OF ADMISSION: 10/22/2015 PRIMARY CARE PHYSICIAN: Rubye Beach, MD  REQUESTING/REFERRING PHYSICIAN: ED Dr. Quentin Cornwall  CHIEF COMPLAINT: Chief Complaint  Patient presents with  . Fall    headache    HISTORY OF PRESENT ILLNESS: Kristin [name] is a 70 y.o. female with a known history of DM, HTN,. Asthma was in her usual state of health until three nights ago when she sustained a fall. Patient states that she was in bed with her husband and got up to use the bathroom and had a fall and hit her head. Patient does not recall any of the events surrounding this event.  Per husband, she called out for help.  There was no prolonged loss of consciousness reported by the husband.   She presents to the ED today for worsening left-sided headache and nausea that is been gradually worsening over the past 3 days. She also complains of shortness of breath worse than her baseline.  Denies any numbness or tingling.  Of note, she states that she's also been treated with antibiotics most recently Augmentin for a UTI for the past 5 days.  Otherwise there has been no change in status. Patient has been taking medication as prescribed and there has been no recent change in medication or diet.  There has been no recent illness, travel or sick contacts.    Patient denies fevers/chills, weakness, dizziness, chest pain, shortness of breath, C/D, abdominal pain, dysuria/frequency, changes in mental status.   EMS/ED COURSE:     PAST MEDICAL HISTORY: Past Medical History:  Diagnosis Date  . Asthma   . Cancer [facility]) 2010   Left ear cancer  . Diabetes mellitus without complication ([place])   . Hypertension       PAST SURGICAL  HISTORY: History reviewed. No pertinent surgical history.    SOCIAL HISTORY: Social History  Substance Use Topics  . Smoking status: Never Smoker  . Smokeless tobacco: Never Used  . Alcohol use No      FAMILY HISTORY: History reviewed. No pertinent family history.   MEDICATIONS AT HOME: Prior to Admission medications   Medication Sig Start Date End Date Taking? Authorizing Provider  amoxicillin-clavulanate (AUGMENTIN) 500-125 MG tablet Take 1 tablet by mouth 2 (two) times daily. 10/19/15  Yes Historical Provider, MD  atorvastatin (LIPITOR) 40 MG tablet Take 1 tablet by mouth daily at 6 PM. 10/12/15  Yes Historical Provider, MD  clonazePAM (KLONOPIN) 1 MG tablet Take 1 tablet by mouth daily. 10/01/15  Yes Historical Provider, MD  fenofibrate 160 MG tablet Take 1 tablet by mouth daily. 10/06/15  Yes Historical Provider, MD  furosemide (LASIX) 40 MG tablet Take 1 tablet by mouth daily. 08/03/15  Yes Historical Provider, MD  JANUMET XR [phone] MG TB24 Take 1 tablet by mouth daily. 10/06/15  Yes Historical Provider, MD  levothyroxine (SYNTHROID, LEVOTHROID) 112 MCG tablet Take 1 tablet by mouth daily. 09/15/15  Yes Historical Provider, MD  losartan (COZAAR) 50 MG tablet Take 1 tablet by mouth daily. 09/08/15  Yes Historical Provider, MD  pantoprazole (PROTONIX) 40 MG tablet Take 1 tablet by mouth 2 (two) times daily with breakfast and lunch. 10/20/15  Yes Historical Provider, MD  promethazine (PHENERGAN) 25 MG tablet Take 25 mg by mouth every 6 (six) hours as needed.  09/14/15  Yes Historical Provider, MD  sulfamethoxazole-trimethoprim (BACTRIM DS,SEPTRA DS)  800-160 MG tablet Take 1 tablet by mouth 2 (two) times daily.  10/19/15  Yes Historical Provider, MD  traZODone (DESYREL) 100 MG tablet Take 1.5 tablets by mouth at bedtime. 09/07/15  Yes Historical Provider, MD  VENTOLIN HFA 108 (90 Base) MCG/ACT inhaler Inhale 2 puffs into the lungs every 6 (six) hours as needed.  10/11/15  Yes Historical Provider,  MD      DRUG ALLERGIES: Allergies  Allergen Reactions  . Codeine Nausea Only  . Morphine And Related Nausea Only     REVIEW OF SYSTEMS: CONSTITUTIONAL: No fever/chills. No fatigue, weakness. No weight gain, no weight loss. EYES: No blurry or double vision. ENT: No tinnitus. No postnasal drip. No redness or soreness of the oropharynx. RESPIRATORY: No cough, no wheeze, no hemoptysis. No dyspnea. CARDIOVASCULAR: No chest pain. No orthopnea. No palpitations. No syncope. GASTROINTESTINAL: (+) nausea, no vomiting or diarrhea. No abdominal pain. No melena or hematochezia. GENITOURINARY: No dysuria or hematuria. ENDOCRINE: No polyuria or nocturia. No heat or cold intolerance. HEMATOLOGY: No anemia. No bruising. No bleeding. INTEGUMENTARY: No rashes. No lesions. MUSCULOSKELETAL: No arthritis. No swelling. No gout. NEUROLOGIC: No numbness, tingling, weakness or ataxia. No seizure-type activity. PSYCHIATRIC: No anxiety. No depression. No insomnia.  PHYSICAL EXAMINATION: VITAL SIGNS: Blood pressure (!) 136/40, pulse 81, temperature 97.5 F (36.4 C), temperature source Oral, resp. rate 16, height 5\' 3"  (1.6 m), weight 54.8 kg (120 lb 12.8 oz), SpO2 93 %.  GENERAL: 70 y.o.-year-old white female patient, well-developed, well-nourished lying in the bed in no acute distress. EYES: Pupils equal, round, reactive to light and accommodation. No scleral icterus. Extraocular muscles intact. HEENT: Head atraumatic, normocephalic. Oropharynx and nasopharynx clear. Mucus membranes moist. NECK: Supple, full range of motion. No JVD, no bruit heard. No thyroid enlargement, no tenderness, no lymphadenopathy. CHEST: Normal breath sounds bilaterally, no wheezing, rales, rhonchi or crepitation. No use of accessory muscles of respiration.  No reproducible chest wall tenderness.  CARDIOVASCULAR: S1, S2 normal. No murmurs, rubs, or gallops. Cap refill <2 seconds. ABDOMEN: Soft, nontender, nondistended. No rebound,  guarding, rigidity. Normoactive bowel sounds present in all four quadrants. No organomegaly or mass. EXTREMITIES: Full range of motion. No pedal edema, cyanosis, or clubbing. NEUROLOGIC: Cranial nerves II through XII are grossly intact with no focal sensorimotor deficit. Muscle strength 5/5 in all extremities. Sensation intact. Gait not checked. PSYCHIATRIC: The patient is alert and oriented x 3. Normal affect, mood, thought content. SKIN: Warm, dry, and intact without obvious rash, lesion, or ulcer.  LABORATORY PANEL:  CBC  Recent Labs Lab 10/22/15 1731  WBC 7.6  HGB 11.5*  HCT 33.0*  PLT 213   ----------------------------------------------------------------------------------------------------------------- Chemistries  Recent Labs Lab 10/22/15 1731  NA 136  K 4.3  CL 103  CO2 23  GLUCOSE 85  BUN 26*  CREATININE 1.71*  CALCIUM 9.8  MG 1.3*  AST 18  ALT 12*  ALKPHOS 35*  BILITOT 0.9   ------------------------------------------------------------------------------------------------------------------ Cardiac Enzymes  Recent Labs Lab 10/22/15 1731  TROPONINI 0.05*   ------------------------------------------------------------------------------------------------------------------  RADIOLOGY: Dg Chest 2 View  Result Date: 10/22/2015 CLINICAL DATA:  Status post fall, with concern for chest injury. Nausea, headache and memory impairment. Initial encounter. EXAM: CHEST  2 VIEW COMPARISON:  Chest radiograph performed 06/06/2013, and CTA of the chest performed 06/07/2013 FINDINGS: The lungs are well-aerated and clear. There is no evidence of focal opacification, pleural effusion or pneumothorax. The heart is normal in size; the mediastinal contour is within normal limits. No acute osseous abnormalities are  seen. Cervical spinal fusion hardware is noted. IMPRESSION: No acute cardiopulmonary process seen. No displaced rib fractures identified. Electronically Signed   By: Garald Balding M.D.   On: 10/22/2015 21:49   Ct Head Wo Contrast  Result Date: 10/22/2015 CLINICAL DATA:  Fall last Friday, nausea, headache EXAM: CT HEAD WITHOUT CONTRAST TECHNIQUE: Contiguous axial images were obtained from the base of the skull through the vertex without intravenous contrast. COMPARISON:  05/25/2012 FINDINGS: No intracranial hemorrhage, mass effect or midline shift. No acute cortical infarction. No hydrocephalus. No mass lesion is noted on this unenhanced scan. No skull fracture is noted. Paranasal sinuses and mastoid air cells are unremarkable. Mild atherosclerotic calcifications of carotid siphon. IMPRESSION: No acute intracranial abnormality.  No significant change. Electronically Signed   By: Lahoma Crocker M.D.   On: 10/22/2015 18:32    EKG:  NSR @75bpm , nonspecific ST changes  IMPRESSION AND PLAN:  This is a 70 y.o. female with a history of asthma, DM, HTN now being admitted with: 1. Syncope - admit tele for workup including carotids, echo, orthostatics, gentle fluids.  Consider neuro consult 2. Elevated troponin rule out ACS - trend trops, lipids, TSH. Rec'd aspirin and will continue, beta blocker and statin.  3. AKI - gentle fluids 4. COPD - continue home medications, Nebs, O2 5. Hypomagnesemia - replaced in ED recheck in AM Continue home meds.   Fluids: IVNS Diet/Nutrition: Diabetic, heart healthy DVT Px: Lovenox, SCDs  All the records are reviewed and case discussed with ED provider. Management plans discussed with the patient and/or family who express understanding and agree with plan of care.  CODE STATUS: full TOTAL TIME TAKING CARE OF THIS PATIENT: 60 minutes.   Christell Steinmiller D.O. on 10/23/2015 at 2:28 AM Between 7am to 6pm - Pager - [phone] After 6pm go to [email or web address] - [employer] [facility] [place] [facility] Office [phone] CC: Primary care physician; Rubye Beach, MD     Note: This dictation was prepared with Dragon dictation  along with smaller phrase technology. Any transcriptional errors that result from this process are unintentional.

## 2015-10-23 NOTE — Progress Notes (Signed)
Arrival Method: via stretcher with ED RN Mental Orientation: A&O Telemetry: MX40-2, verified by Lowella Petties, RN Skin: Scattered bruising, hematoma to left side of head, verified by Lowella Petties, RN IV: 20g right forearm Pain: 7 out of 10 headache, to give tylenol for Tubes: NS @ 38ml/hr, 4L O2 Safety Measures: Safety Fall Prevention Plan has been given, discussed & signed, non skid socks in place, bed alarm activated. 2A Orientation: Patient has been orientated to the room, unit & staff.   Orders have been reviewed & implemented. Will continue to monitor the patient. Call light has been placed within reach.  Georgian Co, RN

## 2015-10-23 NOTE — Care Management Note (Signed)
Case Management Note  Patient Details  Name: Kristin [name] MRN: [id number] Date of Birth: May 30, 1945  Subjective/Objective:  Met with patient to discuss PT recommendations. She is adamant that she wants to go home. She has 24 hour care from her family. Her son is in and out and he is an EMT.  Agreeable to home health PT and OT. No agency preference. Referral to [facility]  PCP is Dr. Candiss Norse.                 Action/Plan:   Expected Discharge Date:                  Expected Discharge Plan:  [place]  In-House Referral:     Discharge planning Services  CM Consult  Post Acute Care Choice:  Home Health Choice offered to:  Patient  DME Arranged:    DME Agency:     HH Arranged:  PT, OT HH Agency:  [facility]  Status of Service:  In process, will continue to follow  If discussed at Long Length of Stay Meetings, dates discussed:    Additional Comments:  Jolly Mango, RN 10/23/2015, 3:14 PM

## 2015-10-23 NOTE — Progress Notes (Signed)
Anticoagulation monitoring(Lovenox):  69yo  Female ordered Lovenox 30 mg Q24h  Filed Weights   10/22/15 1716 10/23/15 0111  Weight: 122 lb (55.3 kg) 120 lb 12.8 oz (54.8 kg)   Body mass index is 21.4 kg/m.    Lab Results  Component Value Date   CREATININE 1.27 (H) 10/23/2015   CREATININE 1.71 (H) 10/22/2015   CREATININE 1.10 (H) 08/10/2015   Estimated Creatinine Clearance: 34.6 mL/min (by C-G formula based on SCr of 1.27 mg/dL). Hemoglobin & Hematocrit     Component Value Date/Time   HGB 10.5 (L) 10/23/2015 0750   HGB 11.5 (L) 06/07/2013 0403   HCT 30.1 (L) 10/23/2015 0750   HCT 33.7 (L) 06/07/2013 0403     Per Protocol for Patient with estCrcl > 30 ml/min and BMI < 40, will transition to Lovenox 40 mg Q24h.

## 2015-10-23 NOTE — Progress Notes (Signed)
*  PRELIMINARY RESULTS* Echocardiogram 2D Echocardiogram has been performed.  Sherrie Sport 10/23/2015, 4:34 PM

## 2015-10-23 NOTE — Progress Notes (Signed)
[facility] PROGRESS NOTE  Kristin [name] [id number] DOB: 1946/03/02 DOA: 10/22/2015 PCP: Rubye Beach, MD  HPI/Subjective: Patient ended up on the ground and does not recall what happened. She was walking back from the bathroom.  Objective: Vitals:   10/23/15 0900 10/23/15 1121  BP: (!) 125/54 (!) 101/53  Pulse: 72 71  Resp: 20 (!) 22  Temp:  98.1 F (36.7 C)    Filed Weights   10/22/15 1716 10/23/15 0111  Weight: 55.3 kg (122 lb) 54.8 kg (120 lb 12.8 oz)    ROS: Review of Systems  Constitutional: Negative for chills and fever.  Eyes: Negative for blurred vision.  Respiratory: Negative for cough and shortness of breath.   Cardiovascular: Negative for chest pain.  Gastrointestinal: Negative for abdominal pain, constipation, diarrhea, nausea and vomiting.  Genitourinary: Negative for dysuria.  Musculoskeletal: Negative for joint pain.  Neurological: Positive for weakness. Negative for dizziness and headaches.   Exam: Physical Exam  Constitutional: She is oriented to person, place, and time.  HENT:  Nose: No mucosal edema.  Mouth/Throat: No oropharyngeal exudate or posterior oropharyngeal edema.  Eyes: Conjunctivae, EOM and lids are normal. Pupils are equal, round, and reactive to light.  Neck: No JVD present. Carotid bruit is not present. No edema present. No thyroid mass and no thyromegaly present.  Cardiovascular: S1 normal and S2 normal.  Exam reveals no gallop.   No murmur heard. Pulses:      Dorsalis pedis pulses are 2+ on the right side, and 2+ on the left side.  Respiratory: No respiratory distress. She has no wheezes. She has no rhonchi. She has no rales.  GI: Soft. Bowel sounds are normal. There is no tenderness.  Musculoskeletal:       Right ankle: She exhibits no swelling.       Left ankle: She exhibits no swelling.  Lymphadenopathy:    She has no cervical adenopathy.  Neurological: She is alert and oriented to person, place, and time. No  cranial nerve deficit.  Skin: Skin is warm. No rash noted. Nails show no clubbing.  Psychiatric: She has a normal mood and affect.      Data Reviewed: Basic Metabolic Panel:  Recent Labs Lab 10/22/15 1731 10/23/15 0208 10/23/15 0750  NA 136  --  140  K 4.3  --  4.6  CL 103  --  109  CO2 23  --  25  GLUCOSE 85  --  113*  BUN 26*  --  22*  CREATININE 1.71*  --  1.27*  CALCIUM 9.8  --  9.1  MG 1.3* 2.2  --   PHOS  --  2.9  --    Liver Function Tests:  Recent Labs Lab 10/22/15 1731 10/23/15 0750  AST 18 17  ALT 12* 12*  ALKPHOS 35* 26*  BILITOT 0.9 0.5  PROT 7.0 6.6  ALBUMIN 4.4 4.0   CBC:  Recent Labs Lab 10/22/15 1731 10/23/15 0750  WBC 7.6 6.8  HGB 11.5* 10.5*  HCT 33.0* 30.1*  MCV 93.8 93.0  PLT 213 209   Cardiac Enzymes:  Recent Labs Lab 10/22/15 1731 10/23/15 0208 10/23/15 0750 10/23/15 1341  TROPONINI 0.05* <0.03 <0.03 <0.03    CBG:  Recent Labs Lab 10/22/15 1827 10/23/15 0118 10/23/15 0731 10/23/15 1121  GLUCAP 108* 181* 120* 117*     Studies: Dg Chest 2 View  Result Date: 10/22/2015 CLINICAL DATA:  Status post fall, with concern for chest injury. Nausea, headache and memory impairment.  Initial encounter. EXAM: CHEST  2 VIEW COMPARISON:  Chest radiograph performed 06/06/2013, and CTA of the chest performed 06/07/2013 FINDINGS: The lungs are well-aerated and clear. There is no evidence of focal opacification, pleural effusion or pneumothorax. The heart is normal in size; the mediastinal contour is within normal limits. No acute osseous abnormalities are seen. Cervical spinal fusion hardware is noted. IMPRESSION: No acute cardiopulmonary process seen. No displaced rib fractures identified. Electronically Signed   By: Garald Balding M.D.   On: 10/22/2015 21:49   Ct Head Wo Contrast  Result Date: 10/22/2015 CLINICAL DATA:  Fall last Friday, nausea, headache EXAM: CT HEAD WITHOUT CONTRAST TECHNIQUE: Contiguous axial images were obtained  from the base of the skull through the vertex without intravenous contrast. COMPARISON:  05/25/2012 FINDINGS: No intracranial hemorrhage, mass effect or midline shift. No acute cortical infarction. No hydrocephalus. No mass lesion is noted on this unenhanced scan. No skull fracture is noted. Paranasal sinuses and mastoid air cells are unremarkable. Mild atherosclerotic calcifications of carotid siphon. IMPRESSION: No acute intracranial abnormality.  No significant change. Electronically Signed   By: Lahoma Crocker M.D.   On: 10/22/2015 18:32   US Carotid Bilateral  Result Date: 10/23/2015 CLINICAL DATA:  Syncope. EXAM: BILATERAL CAROTID DUPLEX ULTRASOUND TECHNIQUE: Pearline Cables scale imaging, color Doppler and duplex ultrasound were performed of bilateral carotid and vertebral arteries in the neck. COMPARISON:  08/05/2011 FINDINGS: Criteria: Quantification of carotid stenosis is based on velocity parameters that correlate the residual internal carotid diameter with NASCET-based stenosis levels, using the diameter of the distal internal carotid lumen as the denominator for stenosis measurement. The following velocity measurements were obtained: RIGHT ICA:  149 cm/sec CCA:  99991111 cm/sec SYSTOLIC ICA/CCA RATIO:  1.3 DIASTOLIC ICA/CCA RATIO:  1.6 ECA:  189 cm/sec LEFT ICA:  177 cm/sec CCA:  [id number] cm/sec SYSTOLIC ICA/CCA RATIO:  1.2 DIASTOLIC ICA/CCA RATIO:  2.3 ECA:  156 cm/sec RIGHT CAROTID ARTERY: Minimal plaque in the right common carotid artery. Small amount of echogenic plaque in the proximal internal carotid artery. RIGHT VERTEBRAL ARTERY: Antegrade flow and normal waveform in the right vertebral artery. LEFT CAROTID ARTERY: Echogenic plaque at the left carotid bulb and proximal internal carotid artery. Peak systolic velocities elevated in the mid internal carotid artery measuring up to 177 cm/sec. Difficult to assess the degree of stenosis based on the grayscale imaging due to some artifact from the calcified plaque. LEFT  VERTEBRAL ARTERY: Antegrade flow and normal waveform in the left vertebral artery. IMPRESSION: Mild atherosclerotic disease in the carotid arteries. The plaque burden is similar to the comparison examination. Peak systolic velocities in the internal carotid arteries are elevated but the carotid artery ratios are less than 2. Suspect that the degree of stenosis in the bilateral internal carotid arteries is 50% or less based on the grayscale imaging and carotid artery ratios. Patent vertebral arteries. Electronically Signed   By: Markus Daft M.D.   On: 10/23/2015 12:12    Scheduled Meds: . aspirin EC  81 mg Oral Daily  . atorvastatin  40 mg Oral q1800  . cefTRIAXone (ROCEPHIN)  IV  1 g Intravenous Q24H  . clonazePAM  1 mg Oral Daily  . enoxaparin (LOVENOX) injection  40 mg Subcutaneous Q24H  . fenofibrate  160 mg Oral Daily  . furosemide  40 mg Oral Daily  . insulin aspart  0-12 Units Subcutaneous TID AC & HS  . levothyroxine  112 mcg Oral QAC breakfast  . losartan  50 mg Oral  Daily  . metoprolol succinate  12.5 mg Oral Daily  . pantoprazole  40 mg Oral BID WC  . sodium chloride flush  3 mL Intravenous Q12H  . traZODone  150 mg Oral QHS   Continuous Infusions: . sodium chloride 75 mL/hr at 10/23/15 0122    Assessment/Plan:   1. Acute kidney injury and syncope and relative hypotension. Continue IV fluid hydration. So far carotid ultrasound negative. Still awaiting echocardiogram. Patient's blood pressure did drop down with orthostatics. Hold Lasix and losartan. Hopefully can continue Toprol. 2. Urinary tract infection. Try to get culture from [facility]. Add on urine culture to urine analysis already drawn here. Patient on Rocephin. As per family was switched to Augmentin recently. 3. Weakness physical therapy recommended rehabilitation. Patient still on observation status. 4. Hypothyroidism unspecified on levothyroxine 5. History of COPD 6. Type 2 diabetes on sliding  scale 7. Hyperlipidemia unspecified on atorvastatin. I will hold this for right now while patient is feeling weak.  Code Status:     Code Status Orders        Start     Ordered   10/23/15 0050  Full code  Continuous     10/23/15 0050    Code Status History    Date Active Date Inactive Code Status Order ID Comments User Context   10/23/2015 12:50 AM 10/23/2015  3:04 PM Full Code [id number]  Harvie Bridge, DO ED     Family Communication: Husband at the bedside Disposition Plan: Physical therapy recommended rehabilitation but patient still an observation status. I feel uncomfortable sending the patient home today. Hopefully physical therapy can work with the patient tomorrow and get a better result.  Antibiotics:  Rocephin  Time spent: 30 minutes  Loletha Grayer  [employer]

## 2015-10-23 NOTE — Evaluation (Signed)
Physical Therapy Evaluation Patient Details Name: Kristin [name] MRN: [id number] DOB: December 22, 1945 Today's Date: 10/23/2015   History of Present Illness  presented to ER status post fall; admitted under observation for syncope work up.  Mild elevation in troponin, consistent with demand ischemia per notes.  Clinical Impression  Upon evaluation, patient alert and oriented; follows all commands and demonstrates fair/good safety awareness and insight.  Bilat UE/LEs grossly symmetrical and WFL for basic transfers and mobility; denies pain at this time.  Patient with mild/mod DOE (chronic 3L), reports this is baseline for her; however, sats remain >92% on 3L throughout session.  Patient generally weak and unsteady with transition to upright, requiring min assist from therapist to prevent LOB to R during standing and transfers.  Mobiltiy improved to cga with use of RW, but remains limited in distance and activity tolerance due to fatigue and DOE.  Unable to demonstrate full household distances at this time.. Would benefit from skilled PT to address above deficits and promote optimal return to PLOF; recommend transition to STR upon discharge from acute hospitalization.  May improve to HHPT as medical status improves.     Follow Up Recommendations SNF    Equipment Recommendations  Rolling walker with 5" wheels    Recommendations for Other Services       Precautions / Restrictions Precautions Precautions: Fall Restrictions Weight Bearing Restrictions: No      Mobility  Bed Mobility Overal bed mobility: Modified Independent                Transfers Overall transfer level: Needs assistance Equipment used: Rolling walker (2 wheeled) Transfers: Sit to/from Stand Sit to Stand: Min assist            Ambulation/Gait Ambulation/Gait assistance: Min assist Ambulation Distance (Feet): 4 Feet Assistive device: None       General Gait Details: R lateral lean requiring min  assist from therapist to prevent fall; staggering steps with poor balance reactions.  Requires UE support at all times for extenral stabilization.  Stairs            Wheelchair Mobility    Modified Rankin (Stroke Patients Only)       Balance Overall balance assessment: Needs assistance Sitting-balance support: No upper extremity supported;Feet supported Sitting balance-Leahy Scale: Good     Standing balance support: No upper extremity supported Standing balance-Leahy Scale: Poor                               Pertinent Vitals/Pain Pain Assessment: No/denies pain    Home Living Family/patient expects to be discharged to:: Private residence Living Arrangements: Spouse/significant other Available Help at Discharge: Family Type of Home: House Home Access: Stairs to enter   Technical brewer of Steps: 1 Home Layout: One level        Prior Function Level of Independence: Independent         Comments: Mod indep for ADLs, household mobility; very limited community exposure due to chronic DOE.  Baseline continuous 3L supplemental O2.  Denies fall history outside of this event.     Hand Dominance        Extremity/Trunk Assessment   Upper Extremity Assessment: Overall WFL for tasks assessed           Lower Extremity Assessment: Overall WFL for tasks assessed (grossly  4/5 throughout)         Communication      Cognition Arousal/Alertness:  Awake/alert Behavior During Therapy: WFL for tasks assessed/performed Overall Cognitive Status: Within Functional Limits for tasks assessed                      General Comments      Exercises Other Exercises Other Exercises: 71' with RW, cga/close sup--improved safety/stability with use of assist device; patient subjectively reporting greater sense of comfort.  Recommend use of RW for all mobility at this time. Other Exercises: Toilet tranfser, SPT to/from [place], min assist without assist  device; requires UE support throughout.  Generally unsteady in upright position; minimal reports of dizziness.      Assessment/Plan    PT Assessment Patient needs continued PT services  PT Diagnosis Difficulty walking;Generalized weakness   PT Problem List Decreased activity tolerance;Decreased balance;Decreased mobility;Decreased knowledge of precautions;Cardiopulmonary status limiting activity;Impaired sensation;Decreased safety awareness  PT Treatment Interventions Gait training;Stair training;Functional mobility training;DME instruction;Therapeutic activities;Therapeutic exercise;Balance training;Patient/family education   PT Goals (Current goals can be found in the Care Plan section) Acute Rehab PT Goals Patient Stated Goal: to get out of that bed PT Goal Formulation: With patient Time For Goal Achievement: 11/06/15 Potential to Achieve Goals: Good    Frequency Min 2X/week   Barriers to discharge Decreased caregiver support      Co-evaluation               End of Session Equipment Utilized During Treatment: Gait belt;Oxygen Activity Tolerance: Patient tolerated treatment well;Patient limited by fatigue Patient left: in chair;with call bell/phone within reach;with chair alarm set;with family/visitor present Nurse Communication: Mobility status    Functional Assessment Tool Used: clinical judgement Functional Limitation: Mobility: Walking and moving around Mobility: Walking and Moving Around Current Status [id number]): At least 20 percent but less than 40 percent impaired, limited or restricted Mobility: Walking and Moving Around Goal Status [phone]): At least 1 percent but less than 20 percent impaired, limited or restricted    Time: 1150-1206 PT Time Calculation (min) (ACUTE ONLY): 16 min   Charges:   PT Evaluation $PT Eval Low Complexity: 1 Procedure PT Treatments $Therapeutic Activity: 8-22 mins   PT G Codes:   PT G-Codes **NOT FOR INPATIENT CLASS** Functional  Assessment Tool Used: clinical judgement Functional Limitation: Mobility: Walking and moving around Mobility: Walking and Moving Around Current Status [id number]): At least 20 percent but less than 40 percent impaired, limited or restricted Mobility: Walking and Moving Around Goal Status [phone]): At least 1 percent but less than 20 percent impaired, limited or restricted    Mariell Nester H. Owens Shark, PT, DPT, NCS 10/23/15, 2:09 PM [phone]

## 2015-10-23 NOTE — Clinical Social Work Note (Signed)
MSW received referral for SNF.  Case discussed with case manager and plan is to discharge home with home health.  MSW to sign off please re-consult if social work needs arise.  Isley Zinni R. Gerilyn Stargell, MSW Mon-Fri 8a-4:30p [phone]   

## 2015-10-23 NOTE — Progress Notes (Signed)
PT Cancellation Note  Patient Details Name: Bergan Moulton MRN: [id number] DOB: Oct 28, 1945   Cancelled Treatment:    Reason Eval/Treat Not Completed: Patient at procedure or test/unavailable (Consult received and chart reviewed.  Patient currently off unit for diagnostic testing.  Will re-attempt at later time/date as patient available and medically appropriate.)   Jayce Kainz H. Owens Shark, PT, DPT, NCS 10/23/15, 9:47 AM [phone]

## 2015-10-23 NOTE — Care Management Obs Status (Signed)
[facility] NOTIFICATION   Patient Details  Name: Kristin [name] MRN: [id number] Date of Birth: May 07, 1945   Medicare Observation Status Notification Given:  Yes    Jolly Mango, RN 10/23/2015, 11:44 AM

## 2015-10-24 LAB — ECHOCARDIOGRAM COMPLETE
Height: 63 in
WEIGHTICAEL: 1932.8 [oz_av]

## 2015-10-24 LAB — URINE CULTURE: Culture: NO GROWTH

## 2015-10-24 LAB — BASIC METABOLIC PANEL
Anion gap: 5 (ref 5–15)
BUN: 18 mg/dL (ref 6–20)
CHLORIDE: 112 mmol/L — AB (ref 101–111)
CO2: 25 mmol/L (ref 22–32)
CREATININE: 1.1 mg/dL — AB (ref 0.44–1.00)
Calcium: 8.5 mg/dL — ABNORMAL LOW (ref 8.9–10.3)
GFR calc non Af Amer: 50 mL/min — ABNORMAL LOW (ref >60)
GFR, EST AFRICAN AMERICAN: 58 mL/min — AB (ref >60)
Glucose, Bld: 114 mg/dL — ABNORMAL HIGH (ref 65–99)
Potassium: 4.1 mmol/L (ref 3.5–5.1)
Sodium: 142 mmol/L (ref 135–145)

## 2015-10-24 LAB — GLUCOSE, CAPILLARY
GLUCOSE-CAPILLARY: 110 mg/dL — AB (ref 65–99)
Glucose-Capillary: 102 mg/dL — ABNORMAL HIGH (ref 65–99)
Glucose-Capillary: 106 mg/dL — ABNORMAL HIGH (ref 65–99)

## 2015-10-24 MED ORDER — ASPIRIN 81 MG PO TBEC: 81.0000 mg | DELAYED_RELEASE_TABLET | Freq: Every day | ORAL | 0 refills | Status: DC

## 2015-10-24 NOTE — Discharge Summary (Signed)
Kristin [name] NAME: Kristin [name]    MR#:  [id number]  DATE OF BIRTH:  [birth year]  DATE OF ADMISSION:  10/22/2015 ADMITTING PHYSICIAN: Epifanio Lesches, MD  DATE OF DISCHARGE: 10/24/2015  2:49 PM  PRIMARY CARE PHYSICIAN: Rubye Beach, MD    ADMISSION DIAGNOSIS:  Syncope and collapse [R55] Syncope [R55] UTI (lower urinary tract infection) [N39.0] COPD with acute exacerbation ([place]) [J44.1] Head contusion, initial encounter [S00.93XA] Fall at home, initial encounter [W19.Merril Abbe, [id number]  DISCHARGE DIAGNOSIS:  Active Problems:   Syncope   SECONDARY DIAGNOSIS:   Past Medical History:  Diagnosis Date  . Asthma   . Cancer [facility]) 2010   Left ear cancer  . Diabetes mellitus without complication ([place])   . Hypertension     HOSPITAL COURSE:   1. Syncope. Likely orthostatic hypotension and dehydration contributing. 2. Acute kidney injury. Improved with IV fluid hydration. 3. Relative hypotension. Hold Lasix and losartan. Follow-up PMD as outpatient and check orthostatics prior to restarting blood pressure medication. 4. Urinary tract infection. Can go back on Augmentin as outpatient. Was on Rocephin here. Urine culture reviewed it was Escherichia coli that was sensitive to the Rocephin and also the Augmentin. 5. Weakness recommended rehabilitation. Patient does not want to go to rehabilitation I set up home health for the patient 6. Hypothyroidism unspecified on levothyroxine 7. History of COPD on oxygen 8. History of diabetes on Janumet 9. Hyperlipidemia unspecified continue atorvastatin  DISCHARGE CONDITIONS:   Satisfactory  CONSULTS OBTAINED:   none  DRUG ALLERGIES:   Allergies  Allergen Reactions  . Codeine Nausea Only  . Morphine And Related Nausea Only    DISCHARGE MEDICATIONS:   Discharge Medication List as of 10/24/2015  2:16 PM    START taking these medications   Details  aspirin EC 81 MG EC tablet Take 1  tablet (81 mg total) by mouth daily., Starting Wed 10/24/2015, Normal      CONTINUE these medications which have NOT CHANGED   Details  amoxicillin-clavulanate (AUGMENTIN) 500-125 MG tablet Take 1 tablet by mouth 2 (two) times daily., Starting Fri 10/19/2015, Historical Med    atorvastatin (LIPITOR) 40 MG tablet Take 1 tablet by mouth daily at 6 PM., Starting Fri 10/12/2015, Historical Med    clonazePAM (KLONOPIN) 1 MG tablet Take 1 tablet by mouth daily., Starting Mon 10/01/2015, Historical Med    fenofibrate 160 MG tablet Take 1 tablet by mouth daily., Starting Sat 10/06/2015, Historical Med    JANUMET XR [phone] MG TB24 Take 1 tablet by mouth daily., Starting Sat 10/06/2015, Historical Med    levothyroxine (SYNTHROID, LEVOTHROID) 112 MCG tablet Take 1 tablet by mouth daily., Starting Sat 09/15/2015, Historical Med    pantoprazole (PROTONIX) 40 MG tablet Take 1 tablet by mouth 2 (two) times daily with breakfast and lunch., Starting Sat 10/20/2015, Historical Med    promethazine (PHENERGAN) 25 MG tablet Take 25 mg by mouth every 6 (six) hours as needed. , Starting Fri 09/14/2015, Historical Med    traZODone (DESYREL) 100 MG tablet Take 1.5 tablets by mouth at bedtime., Starting Fri 09/07/2015, Historical Med    VENTOLIN HFA 108 (90 Base) MCG/ACT inhaler Inhale 2 puffs into the lungs every 6 (six) hours as needed. , Starting Thu 10/11/2015, Historical Med      STOP taking these medications     furosemide (LASIX) 40 MG tablet      losartan (COZAAR) 50 MG tablet      sulfamethoxazole-trimethoprim (BACTRIM  DS,SEPTRA DS) 800-160 MG tablet          DISCHARGE INSTRUCTIONS:   Follow-up PMD one week  If you experience worsening of your admission symptoms, develop shortness of breath, life threatening emergency, suicidal or homicidal thoughts you must seek medical attention immediately by calling 911 or calling your MD immediately  if symptoms less severe.  You Must read complete  instructions/literature along with all the possible adverse reactions/side effects for all the Medicines you take and that have been prescribed to you. Take any new Medicines after you have completely understood and accept all the possible adverse reactions/side effects.   Please note  You were cared for by a hospitalist during your hospital stay. If you have any questions about your discharge medications or the care you received while you were in the hospital after you are discharged, you can call the unit and asked to speak with the hospitalist on call if the hospitalist that took care of you is not available. Once you are discharged, your primary care physician will handle any further medical issues. Please note that NO REFILLS for any discharge medications will be authorized once you are discharged, as it is imperative that you return to your primary care physician (or establish a relationship with a primary care physician if you do not have one) for your aftercare needs so that they can reassess your need for medications and monitor your lab values.    Today   CHIEF COMPLAINT:   Chief Complaint  Patient presents with  . Fall    headache    HISTORY OF PRESENT ILLNESS:  Kristin [name]  is a 70 y.o. female with a known history of Presented after a fall   VITAL SIGNS:  Blood pressure (!) 112/56, pulse 69, temperature 97.8 F (36.6 C), temperature source Oral, resp. rate 20, height 5\' 3"  (1.6 m), weight 54.8 kg (120 lb 12.8 oz), SpO2 96 %.  I/O:   Intake/Output Summary (Last 24 hours) at 10/24/15 1727 Last data filed at 10/24/15 1300  Gross per 24 hour  Intake          1493.75 ml  Output              250 ml  Net          1243.75 ml    PHYSICAL EXAMINATION:  GENERAL:  70 y.o.-year-old patient lying in the bed with no acute distress.  EYES: Pupils equal, round, reactive to light and accommodation. No scleral icterus. Extraocular muscles intact.  HEENT: Head atraumatic,  normocephalic. Oropharynx and nasopharynx clear.  NECK:  Supple, no jugular venous distention. No thyroid enlargement, no tenderness.  LUNGS: Decreased breath sounds bilaterally, no wheezing, rales,rhonchi or crepitation. No use of accessory muscles of respiration.  CARDIOVASCULAR: S1, S2 normal. No murmurs, rubs, or gallops.  ABDOMEN: Soft, non-tender, non-distended. Bowel sounds present. No organomegaly or mass.  EXTREMITIES: No pedal edema, cyanosis, or clubbing.  NEUROLOGIC: Cranial nerves II through XII are intact. Muscle strength 5/5 in all extremities. Sensation intact. Gait not checked.  PSYCHIATRIC: The patient is alert and oriented x 3.  SKIN: No obvious rash, lesion, or ulcer.   DATA REVIEW:   CBC  Recent Labs Lab 10/23/15 0750  WBC 6.8  HGB 10.5*  HCT 30.1*  PLT 209    Chemistries   Recent Labs Lab 10/23/15 0208 10/23/15 0750 10/24/15 0430  NA  --  140 142  K  --  4.6 4.1  CL  --  109 112*  CO2  --  25 25  GLUCOSE  --  113* 114*  BUN  --  22* 18  CREATININE  --  1.27* 1.10*  CALCIUM  --  9.1 8.5*  MG 2.2  --   --   AST  --  17  --   ALT  --  12*  --   ALKPHOS  --  26*  --   BILITOT  --  0.5  --     Cardiac Enzymes  Recent Labs Lab 10/23/15 1341  TROPONINI <0.03    Microbiology Results  Results for orders placed or performed during the hospital encounter of 10/22/15  Urine culture     Status: None   Collection Time: 10/22/15  6:47 PM  Result Value Ref Range Status   Specimen Description URINE, CLEAN CATCH  Final   Special Requests NONE  Final   Culture NO GROWTH Performed at [facility]   Final   Report Status 10/24/2015 FINAL  Final    RADIOLOGY:  Dg Chest 2 View  Result Date: 10/22/2015 CLINICAL DATA:  Status post fall, with concern for chest injury. Nausea, headache and memory impairment. Initial encounter. EXAM: CHEST  2 VIEW COMPARISON:  Chest radiograph performed 06/06/2013, and CTA of the chest performed 06/07/2013  FINDINGS: The lungs are well-aerated and clear. There is no evidence of focal opacification, pleural effusion or pneumothorax. The heart is normal in size; the mediastinal contour is within normal limits. No acute osseous abnormalities are seen. Cervical spinal fusion hardware is noted. IMPRESSION: No acute cardiopulmonary process seen. No displaced rib fractures identified. Electronically Signed   By: Garald Balding M.D.   On: 10/22/2015 21:49   Ct Head Wo Contrast  Result Date: 10/22/2015 CLINICAL DATA:  Fall last Friday, nausea, headache EXAM: CT HEAD WITHOUT CONTRAST TECHNIQUE: Contiguous axial images were obtained from the base of the skull through the vertex without intravenous contrast. COMPARISON:  05/25/2012 FINDINGS: No intracranial hemorrhage, mass effect or midline shift. No acute cortical infarction. No hydrocephalus. No mass lesion is noted on this unenhanced scan. No skull fracture is noted. Paranasal sinuses and mastoid air cells are unremarkable. Mild atherosclerotic calcifications of carotid siphon. IMPRESSION: No acute intracranial abnormality.  No significant change. Electronically Signed   By: Lahoma Crocker M.D.   On: 10/22/2015 18:32   US Carotid Bilateral  Result Date: 10/23/2015 CLINICAL DATA:  Syncope. EXAM: BILATERAL CAROTID DUPLEX ULTRASOUND TECHNIQUE: Pearline Cables scale imaging, color Doppler and duplex ultrasound were performed of bilateral carotid and vertebral arteries in the neck. COMPARISON:  08/05/2011 FINDINGS: Criteria: Quantification of carotid stenosis is based on velocity parameters that correlate the residual internal carotid diameter with NASCET-based stenosis levels, using the diameter of the distal internal carotid lumen as the denominator for stenosis measurement. The following velocity measurements were obtained: RIGHT ICA:  149 cm/sec CCA:  [id number] cm/sec SYSTOLIC ICA/CCA RATIO:  1.3 DIASTOLIC ICA/CCA RATIO:  1.6 ECA:  189 cm/sec LEFT ICA:  177 cm/sec CCA:  [id number] cm/sec SYSTOLIC  ICA/CCA RATIO:  1.2 DIASTOLIC ICA/CCA RATIO:  2.3 ECA:  156 cm/sec RIGHT CAROTID ARTERY: Minimal plaque in the right common carotid artery. Small amount of echogenic plaque in the proximal internal carotid artery. RIGHT VERTEBRAL ARTERY: Antegrade flow and normal waveform in the right vertebral artery. LEFT CAROTID ARTERY: Echogenic plaque at the left carotid bulb and proximal internal carotid artery. Peak systolic velocities elevated in the mid internal carotid artery measuring up to 177 cm/sec. Difficult to  assess the degree of stenosis based on the grayscale imaging due to some artifact from the calcified plaque. LEFT VERTEBRAL ARTERY: Antegrade flow and normal waveform in the left vertebral artery. IMPRESSION: Mild atherosclerotic disease in the carotid arteries. The plaque burden is similar to the comparison examination. Peak systolic velocities in the internal carotid arteries are elevated but the carotid artery ratios are less than 2. Suspect that the degree of stenosis in the bilateral internal carotid arteries is 50% or less based on the grayscale imaging and carotid artery ratios. Patent vertebral arteries. Electronically Signed   By: Markus Daft M.D.   On: 10/23/2015 12:12    Management plans discussed with the patient, family and they are in agreement.  CODE STATUS:     Code Status Orders        Start     Ordered   10/23/15 0050  Full code  Continuous     10/23/15 0050    Code Status History    Date Active Date Inactive Code Status Order ID Comments User Context   10/23/2015 12:50 AM 10/23/2015  3:04 PM Full Code [id number]  Harvie Bridge, DO ED      TOTAL TIME TAKING CARE OF THIS PATIENT: 35 minutes.    Loletha Grayer M.D on 10/24/2015 at 5:27 PM  Between 7am to 6pm - Pager - [phone]  After 6pm go to [email or web address] - password [email or web address]  [facility] Office  [phone]  CC: Primary care physician; Rubye Beach, MD

## 2015-10-24 NOTE — Progress Notes (Signed)
Discharge instructions explained to pt and pts spouse/ verbalized an  Understanding / iv and tele removed/ will transport off unit via wheelchair.

## 2015-10-24 NOTE — Care Management (Signed)
Spoke with spouse. Discussed observation letter in detail. He verbalized understanding.

## 2015-10-24 NOTE — Care Management (Signed)
Kristin [name] with [facility] notified of discharge home with home health

## 2015-10-24 NOTE — Evaluation (Signed)
Physical Therapy Evaluation Patient Details Name: Kristin [name] MRN: [id number] DOB: 08/30/1945 Today's Date: 10/24/2015   History of Present Illness  presented to ER status post fall; admitted under observation for syncope work up.  Mild elevation in troponin, consistent with demand ischemia per notes.    Clinical Impression  Pt in chair awaiting discharge.  She was able to stand and ambulate 1' with good safety and without loss of balance. Pt reports feeling comfortable and ready for discharge.       Follow Up Recommendations  HHPT    Equipment Recommendations       Recommendations for Other Services       Precautions / Restrictions Precautions Precautions: None Restrictions Weight Bearing Restrictions: No      Mobility  Bed Mobility                  Transfers Overall transfer level: Modified independent Equipment used: Rolling walker (2 wheeled) Transfers: Sit to/from Stand Sit to Stand: Modified independent (Device/Increase time)            Ambulation/Gait Ambulation/Gait assistance: Modified independent (Device/Increase time) Ambulation Distance (Feet): 60 Feet Assistive device: Rolling walker (2 wheeled) Gait Pattern/deviations: WFL(Within Functional Limits)   Gait velocity interpretation: Below normal speed for age/gender General Gait Details: Generally steady today without loss of balance, good safety.  Stairs            Wheelchair Mobility    Modified Rankin (Stroke Patients Only)       Balance Overall balance assessment: Modified Independent         Standing balance support: Bilateral upper extremity supported Standing balance-Leahy Scale: Good                               Pertinent Vitals/Pain Pain Assessment: No/denies pain    Home Living                        Prior Function                 Hand Dominance        Extremity/Trunk Assessment                        Communication      Cognition Arousal/Alertness: Awake/alert Behavior During Therapy: WFL for tasks assessed/performed Overall Cognitive Status: Within Functional Limits for tasks assessed                      General Comments      Exercises        Assessment/Plan    PT Assessment    PT Diagnosis     PT Problem List    PT Treatment Interventions     PT Goals (Current goals can be found in the Care Plan section)      Frequency     Barriers to discharge        Co-evaluation               End of Session                 Time: 1410-1419 PT Time Calculation (min) (ACUTE ONLY): 9 min   Charges:     PT Treatments $Gait Training: 8-22 mins   PT G Codes:        Chesley Noon, PTA 10/24/15, 3:31 PM

## 2016-05-23 ENCOUNTER — Inpatient Hospital Stay: Payer: Medicare Other

## 2016-05-23 ENCOUNTER — Inpatient Hospital Stay
Admission: EM | Admit: 2016-05-23 | Discharge: 2016-05-26 | DRG: 193 | Disposition: A | Discharge status: Home Health | Payer: Medicare Other | Attending: Internal Medicine | Admitting: Internal Medicine

## 2016-05-23 ENCOUNTER — Emergency Department: Payer: Medicare Other

## 2016-05-23 DIAGNOSIS — E1122 Type 2 diabetes mellitus with diabetic chronic kidney disease: Secondary | ICD-10-CM | POA: Diagnosis present

## 2016-05-23 DIAGNOSIS — E1143 Type 2 diabetes mellitus with diabetic autonomic (poly)neuropathy: Secondary | ICD-10-CM | POA: Diagnosis present

## 2016-05-23 DIAGNOSIS — Z7984 Long term (current) use of oral hypoglycemic drugs: Secondary | ICD-10-CM

## 2016-05-23 DIAGNOSIS — E039 Hypothyroidism, unspecified: Secondary | ICD-10-CM | POA: Diagnosis present

## 2016-05-23 DIAGNOSIS — Z7982 Long term (current) use of aspirin: Secondary | ICD-10-CM | POA: Diagnosis not present

## 2016-05-23 DIAGNOSIS — J9621 Acute and chronic respiratory failure with hypoxia: Secondary | ICD-10-CM | POA: Diagnosis present

## 2016-05-23 DIAGNOSIS — Z9981 Dependence on supplemental oxygen: Secondary | ICD-10-CM | POA: Diagnosis not present

## 2016-05-23 DIAGNOSIS — R0600 Dyspnea, unspecified: Secondary | ICD-10-CM | POA: Diagnosis not present

## 2016-05-23 DIAGNOSIS — K3184 Gastroparesis: Secondary | ICD-10-CM | POA: Diagnosis present

## 2016-05-23 DIAGNOSIS — J9600 Acute respiratory failure, unspecified whether with hypoxia or hypercapnia: Secondary | ICD-10-CM

## 2016-05-23 DIAGNOSIS — R071 Chest pain on breathing: Secondary | ICD-10-CM | POA: Diagnosis not present

## 2016-05-23 DIAGNOSIS — S20212A Contusion of left front wall of thorax, initial encounter: Secondary | ICD-10-CM | POA: Diagnosis present

## 2016-05-23 DIAGNOSIS — I129 Hypertensive chronic kidney disease with stage 1 through stage 4 chronic kidney disease, or unspecified chronic kidney disease: Secondary | ICD-10-CM | POA: Diagnosis present

## 2016-05-23 DIAGNOSIS — K210 Gastro-esophageal reflux disease with esophagitis: Secondary | ICD-10-CM | POA: Diagnosis present

## 2016-05-23 DIAGNOSIS — E1151 Type 2 diabetes mellitus with diabetic peripheral angiopathy without gangrene: Secondary | ICD-10-CM | POA: Diagnosis present

## 2016-05-23 DIAGNOSIS — W19XXXA Unspecified fall, initial encounter: Secondary | ICD-10-CM | POA: Diagnosis present

## 2016-05-23 DIAGNOSIS — E785 Hyperlipidemia, unspecified: Secondary | ICD-10-CM | POA: Diagnosis present

## 2016-05-23 DIAGNOSIS — R0781 Pleurodynia: Secondary | ICD-10-CM

## 2016-05-23 DIAGNOSIS — R0602 Shortness of breath: Secondary | ICD-10-CM | POA: Diagnosis present

## 2016-05-23 DIAGNOSIS — J441 Chronic obstructive pulmonary disease with (acute) exacerbation: Secondary | ICD-10-CM | POA: Diagnosis present

## 2016-05-23 DIAGNOSIS — R55 Syncope and collapse: Secondary | ICD-10-CM | POA: Diagnosis not present

## 2016-05-23 DIAGNOSIS — N183 Chronic kidney disease, stage 3 (moderate): Secondary | ICD-10-CM | POA: Diagnosis present

## 2016-05-23 DIAGNOSIS — Z8673 Personal history of transient ischemic attack (TIA), and cerebral infarction without residual deficits: Secondary | ICD-10-CM | POA: Diagnosis not present

## 2016-05-23 DIAGNOSIS — F329 Major depressive disorder, single episode, unspecified: Secondary | ICD-10-CM | POA: Diagnosis present

## 2016-05-23 DIAGNOSIS — I2510 Atherosclerotic heart disease of native coronary artery without angina pectoris: Secondary | ICD-10-CM | POA: Diagnosis present

## 2016-05-23 DIAGNOSIS — E440 Moderate protein-calorie malnutrition: Secondary | ICD-10-CM | POA: Diagnosis present

## 2016-05-23 DIAGNOSIS — Z87891 Personal history of nicotine dependence: Secondary | ICD-10-CM | POA: Diagnosis not present

## 2016-05-23 DIAGNOSIS — J432 Centrilobular emphysema: Secondary | ICD-10-CM | POA: Diagnosis not present

## 2016-05-23 DIAGNOSIS — F419 Anxiety disorder, unspecified: Secondary | ICD-10-CM | POA: Diagnosis present

## 2016-05-23 DIAGNOSIS — J189 Pneumonia, unspecified organism: Secondary | ICD-10-CM | POA: Diagnosis present

## 2016-05-23 DIAGNOSIS — R079 Chest pain, unspecified: Secondary | ICD-10-CM

## 2016-05-23 DIAGNOSIS — J440 Chronic obstructive pulmonary disease with acute lower respiratory infection: Secondary | ICD-10-CM | POA: Diagnosis present

## 2016-05-23 LAB — CBC
HEMATOCRIT: 36.6 % (ref 35.0–47.0)
Hemoglobin: 12 g/dL (ref 12.0–16.0)
MCH: 30.7 pg (ref 26.0–34.0)
MCHC: 32.9 g/dL (ref 32.0–36.0)
MCV: 93.1 fL (ref 80.0–100.0)
Platelets: 217 10*3/uL (ref 150–440)
RBC: 3.93 MIL/uL (ref 3.80–5.20)
RDW: 13.4 % (ref 11.5–14.5)
WBC: 11.9 10*3/uL — AB (ref 3.6–11.0)

## 2016-05-23 LAB — FIBRIN DERIVATIVES D-DIMER (ARMC ONLY): FIBRIN DERIVATIVES D-DIMER (ARMC): 438.33 (ref 0.00–499.00)

## 2016-05-23 LAB — BASIC METABOLIC PANEL
ANION GAP: 10 (ref 5–15)
BUN: 16 mg/dL (ref 6–20)
CHLORIDE: 107 mmol/L (ref 101–111)
CO2: 23 mmol/L (ref 22–32)
Calcium: 9.5 mg/dL (ref 8.9–10.3)
Creatinine, Ser: 0.61 mg/dL (ref 0.44–1.00)
GFR calc non Af Amer: >60 mL/min (ref >60)
Glucose, Bld: 161 mg/dL — ABNORMAL HIGH (ref 65–99)
POTASSIUM: 3.7 mmol/L (ref 3.5–5.1)
SODIUM: 140 mmol/L (ref 135–145)

## 2016-05-23 LAB — GLUCOSE, CAPILLARY
GLUCOSE-CAPILLARY: 175 mg/dL — AB (ref 65–99)
Glucose-Capillary: 205 mg/dL — ABNORMAL HIGH (ref 65–99)

## 2016-05-23 LAB — APTT: APTT: 30 s (ref 24–36)

## 2016-05-23 LAB — TROPONIN I: Troponin I: <0.03 ng/mL (ref <0.03)

## 2016-05-23 LAB — PROTIME-INR
INR: 0.92
Prothrombin Time: 12.4 seconds (ref 11.4–15.2)

## 2016-05-23 LAB — MRSA PCR SCREENING: MRSA by PCR: NEGATIVE

## 2016-05-23 MED ORDER — ASPIRIN EC 81 MG PO TBEC
81.0000 mg | DELAYED_RELEASE_TABLET | Freq: Every day | ORAL | Status: DC
  Administered 2016-05-24 – 2016-05-26 (×3): 81 mg via ORAL
  Filled 2016-05-23 (×3): qty 1

## 2016-05-23 MED ORDER — INSULIN ASPART 100 UNIT/ML ~~LOC~~ SOLN
0.0000 [IU] | SUBCUTANEOUS | Status: DC
  Administered 2016-05-23: 3 [IU] via SUBCUTANEOUS
  Administered 2016-05-24: 2 [IU] via SUBCUTANEOUS
  Filled 2016-05-23: qty 2
  Filled 2016-05-23: qty 3

## 2016-05-23 MED ORDER — LORAZEPAM 2 MG/ML IJ SOLN: 0.5000 mg | INTRAMUSCULAR | Status: DC | PRN

## 2016-05-23 MED ORDER — PRAMIPEXOLE DIHYDROCHLORIDE 0.25 MG PO TABS
0.2500 mg | ORAL_TABLET | Freq: Every day | ORAL | Status: DC
  Administered 2016-05-23 – 2016-05-25 (×3): 0.25 mg via ORAL
  Filled 2016-05-23 (×3): qty 1

## 2016-05-23 MED ORDER — METHYLPREDNISOLONE SODIUM SUCC 125 MG IJ SOLR
125.0000 mg | Freq: Once | INTRAMUSCULAR | Status: AC
  Administered 2016-05-23: 125 mg via INTRAVENOUS
  Filled 2016-05-23: qty 2

## 2016-05-23 MED ORDER — FENOFIBRATE 160 MG PO TABS
160.0000 mg | ORAL_TABLET | Freq: Every day | ORAL | Status: DC
  Administered 2016-05-24 – 2016-05-26 (×3): 160 mg via ORAL
  Filled 2016-05-23 (×3): qty 1

## 2016-05-23 MED ORDER — ORAL CARE MOUTH RINSE: 15.0000 mL | Freq: Two times a day (BID) | OROMUCOSAL | Status: DC

## 2016-05-23 MED ORDER — PANTOPRAZOLE SODIUM 40 MG PO TBEC
40.0000 mg | DELAYED_RELEASE_TABLET | Freq: Two times a day (BID) | ORAL | Status: DC
  Administered 2016-05-23 – 2016-05-24 (×2): 40 mg via ORAL
  Filled 2016-05-23 (×2): qty 1

## 2016-05-23 MED ORDER — FUROSEMIDE 10 MG/ML IJ SOLN: 40.0000 mg | Freq: Once | INTRAMUSCULAR | Status: DC

## 2016-05-23 MED ORDER — CLONAZEPAM 0.5 MG PO TABS
1.0000 mg | ORAL_TABLET | Freq: Every day | ORAL | Status: DC
  Filled 2016-05-23: qty 2

## 2016-05-23 MED ORDER — FUROSEMIDE 10 MG/ML IJ SOLN
20.0000 mg | Freq: Once | INTRAMUSCULAR | Status: DC
Start: 2016-05-23 — End: 2016-05-24

## 2016-05-23 MED ORDER — ACETAMINOPHEN 325 MG PO TABS
650.0000 mg | ORAL_TABLET | Freq: Four times a day (QID) | ORAL | Status: DC | PRN
  Administered 2016-05-23: 650 mg via ORAL
  Filled 2016-05-23: qty 2

## 2016-05-23 MED ORDER — ATORVASTATIN CALCIUM 20 MG PO TABS
40.0000 mg | ORAL_TABLET | Freq: Every day | ORAL | Status: DC
  Administered 2016-05-23 – 2016-05-25 (×3): 40 mg via ORAL
  Filled 2016-05-23 (×3): qty 2

## 2016-05-23 MED ORDER — FUROSEMIDE 40 MG PO TABS
40.0000 mg | ORAL_TABLET | Freq: Every day | ORAL | Status: DC
  Administered 2016-05-24: 40 mg via ORAL
  Filled 2016-05-23 (×2): qty 1

## 2016-05-23 MED ORDER — LEVOTHYROXINE SODIUM 112 MCG PO TABS
112.0000 ug | ORAL_TABLET | Freq: Every day | ORAL | Status: DC
  Administered 2016-05-24 – 2016-05-26 (×3): 112 ug via ORAL
  Filled 2016-05-23 (×4): qty 1

## 2016-05-23 MED ORDER — CLONAZEPAM 0.5 MG PO TABS
1.0000 mg | ORAL_TABLET | Freq: Every day | ORAL | Status: DC
  Administered 2016-05-23 – 2016-05-26 (×4): 1 mg via ORAL
  Filled 2016-05-23 (×4): qty 2

## 2016-05-23 MED ORDER — LORAZEPAM 2 MG/ML IJ SOLN
INTRAMUSCULAR | Status: AC
  Filled 2016-05-23: qty 1

## 2016-05-23 MED ORDER — HEPARIN (PORCINE) IN NACL 100-0.45 UNIT/ML-% IJ SOLN: 950.0000 [IU]/h | INTRAMUSCULAR | Status: DC

## 2016-05-23 MED ORDER — KETOROLAC TROMETHAMINE 15 MG/ML IJ SOLN: 15.0000 mg | Freq: Four times a day (QID) | INTRAMUSCULAR | Status: DC | PRN

## 2016-05-23 MED ORDER — LACTATED RINGERS IV SOLN
INTRAVENOUS | Status: DC
  Administered 2016-05-23: 14:00:00 via INTRAVENOUS

## 2016-05-23 MED ORDER — KETOROLAC TROMETHAMINE 15 MG/ML IJ SOLN
15.0000 mg | Freq: Four times a day (QID) | INTRAMUSCULAR | Status: DC | PRN
  Administered 2016-05-23 (×2): 15 mg via INTRAVENOUS
  Filled 2016-05-23: qty 2

## 2016-05-23 MED ORDER — IPRATROPIUM-ALBUTEROL 0.5-2.5 (3) MG/3ML IN SOLN
3.0000 mL | RESPIRATORY_TRACT | Status: DC
  Administered 2016-05-23 – 2016-05-24 (×6): 3 mL via RESPIRATORY_TRACT
  Filled 2016-05-23 (×6): qty 3

## 2016-05-23 MED ORDER — FUROSEMIDE 10 MG/ML IJ SOLN
INTRAMUSCULAR | Status: AC
  Filled 2016-05-23: qty 4

## 2016-05-23 MED ORDER — ONDANSETRON HCL 4 MG/2ML IJ SOLN
4.0000 mg | Freq: Once | INTRAMUSCULAR | Status: AC
  Administered 2016-05-23: 4 mg via INTRAVENOUS
  Filled 2016-05-23: qty 2

## 2016-05-23 MED ORDER — FERROUS SULFATE 325 (65 FE) MG PO TABS
325.0000 mg | ORAL_TABLET | Freq: Every day | ORAL | Status: DC
  Administered 2016-05-24 – 2016-05-26 (×3): 325 mg via ORAL
  Filled 2016-05-23 (×3): qty 1

## 2016-05-23 MED ORDER — ENOXAPARIN SODIUM 60 MG/0.6ML ~~LOC~~ SOLN
1.0000 mg/kg | Freq: Two times a day (BID) | SUBCUTANEOUS | Status: DC
  Administered 2016-05-23 – 2016-05-24 (×3): 55 mg via SUBCUTANEOUS
  Filled 2016-05-23 (×4): qty 0.6

## 2016-05-23 MED ORDER — SENNOSIDES-DOCUSATE SODIUM 8.6-50 MG PO TABS
1.0000 | ORAL_TABLET | Freq: Two times a day (BID) | ORAL | Status: DC
  Administered 2016-05-23 – 2016-05-26 (×6): 1 via ORAL
  Filled 2016-05-23 (×7): qty 1

## 2016-05-23 MED ORDER — LORAZEPAM 2 MG/ML IJ SOLN: 0.5000 mg | Freq: Once | INTRAMUSCULAR | Status: DC

## 2016-05-23 MED ORDER — IPRATROPIUM-ALBUTEROL 0.5-2.5 (3) MG/3ML IN SOLN
3.0000 mL | Freq: Once | RESPIRATORY_TRACT | Status: AC
  Administered 2016-05-23: 3 mL via RESPIRATORY_TRACT

## 2016-05-23 MED ORDER — HEPARIN BOLUS VIA INFUSION
3400.0000 [IU] | Freq: Once | INTRAVENOUS | Status: DC
  Filled 2016-05-23: qty 3400

## 2016-05-23 MED ORDER — IPRATROPIUM-ALBUTEROL 0.5-2.5 (3) MG/3ML IN SOLN
RESPIRATORY_TRACT | Status: AC
  Administered 2016-05-23: 3 mL via RESPIRATORY_TRACT
  Filled 2016-05-23: qty 6

## 2016-05-23 MED ORDER — IPRATROPIUM-ALBUTEROL 0.5-2.5 (3) MG/3ML IN SOLN
3.0000 mL | Freq: Once | RESPIRATORY_TRACT | Status: AC
  Administered 2016-05-23: 3 mL via RESPIRATORY_TRACT
  Filled 2016-05-23: qty 3

## 2016-05-23 MED ORDER — CHLORHEXIDINE GLUCONATE 0.12 % MT SOLN
15.0000 mL | Freq: Two times a day (BID) | OROMUCOSAL | Status: DC
  Administered 2016-05-23 – 2016-05-26 (×4): 15 mL via OROMUCOSAL
  Filled 2016-05-23 (×4): qty 15

## 2016-05-23 MED ORDER — POLYETHYLENE GLYCOL 3350 17 G PO PACK: 17.0000 g | PACK | Freq: Every day | ORAL | Status: DC | PRN

## 2016-05-23 MED ORDER — TRAZODONE HCL 100 MG PO TABS
100.0000 mg | ORAL_TABLET | Freq: Every day | ORAL | Status: DC
  Administered 2016-05-23 – 2016-05-25 (×3): 100 mg via ORAL
  Filled 2016-05-23: qty 2
  Filled 2016-05-23 (×2): qty 1

## 2016-05-23 MED ORDER — ALBUTEROL SULFATE (2.5 MG/3ML) 0.083% IN NEBU
INHALATION_SOLUTION | RESPIRATORY_TRACT | Status: AC
  Administered 2016-05-23: 7.5 mg
  Filled 2016-05-23: qty 9

## 2016-05-23 MED ORDER — MORPHINE SULFATE (PF) 2 MG/ML IV SOLN
2.0000 mg | Freq: Once | INTRAVENOUS | Status: AC
  Administered 2016-05-23: 2 mg via INTRAVENOUS
  Filled 2016-05-23: qty 1

## 2016-05-23 MED ORDER — VITAMIN B-12 1000 MCG PO TABS
1000.0000 ug | ORAL_TABLET | Freq: Every day | ORAL | Status: DC
  Administered 2016-05-24 – 2016-05-26 (×3): 1000 ug via ORAL
  Filled 2016-05-23 (×3): qty 1

## 2016-05-23 MED ORDER — ALPRAZOLAM 0.25 MG PO TABS: 0.2500 mg | ORAL_TABLET | Freq: Once | ORAL | Status: DC

## 2016-05-23 MED ORDER — METHYLPREDNISOLONE SODIUM SUCC 40 MG IJ SOLR
40.0000 mg | Freq: Two times a day (BID) | INTRAMUSCULAR | Status: DC
Start: 2016-05-23 — End: 2016-05-24
  Administered 2016-05-23 – 2016-05-24 (×2): 40 mg via INTRAVENOUS
  Filled 2016-05-23 (×2): qty 1

## 2016-05-23 NOTE — ED Triage Notes (Signed)
Pt fell a week ago and injured left ribs, did not get seen. Has been shob since with worsening symptoms tonight. Pt has copd and is on home o2.

## 2016-05-23 NOTE — Progress Notes (Signed)
Initial Nutrition Assessment  DOCUMENTATION CODES:   Non-severe (moderate) malnutrition in context of chronic illness  INTERVENTION:  -Await diet advancement, recommend addition of nutritional supplement such as Ensure once diet advanced  NUTRITION DIAGNOSIS:   Malnutrition related to chronic illness as evidenced by mild depletion of body fat, severe depletion of muscle mass.  GOAL:   Patient will meet greater than or equal to 90% of their needs  MONITOR:   PO intake, Supplement acceptance, Labs, Weight trends  REASON FOR ASSESSMENT:   Malnutrition Screening Tool    ASSESSMENT:   71 yo female admitted s/p fall with left rib injury 1 week ago with AECOPD vs possible PE. Pt with hx of HTN, DM, HL, CKD III, COPD, CAD, CVA, PVD  Pt anxious with increased work of breathing on visit today; currently on BiPap. Husband at bedside  Pt unable to talk without becoming SOB. Per pt, with good appetite and eating well. Weight fluctuations but no significant wt loss. Pt currently NPO, unable to safely take po.   Nutrition-Focused physical exam completed. Findings are mild fat depletion, mild/moderate to severe muscle depletion, and none edema. Pt with increasing weakness per husband.   Labs: reviewed Meds: reviewed  Diet Order:  Diet NPO time specified Except for: Sips with Meds, Ice Chips  Skin:  Reviewed, no issues  Last BM:  no documented BM  Height:   Ht Readings from Last 1 Encounters:  05/23/16 5\' 6"  (1.676 m)    Weight:   Wt Readings from Last 1 Encounters:  05/23/16 124 lb (56.2 kg)    BMI:  Body mass index is 20.01 kg/m.  Estimated Nutritional Needs:   Kcal:  1650-1850 kcals  Protein:  80-94 g  Fluid:  >/= 1.6 L  EDUCATION NEEDS:   No education needs identified at this time  [place], [place], [place] [phone] Pager  [phone] Weekend/On-Call Pager

## 2016-05-23 NOTE — Progress Notes (Signed)
[facility] for Lovenox Indication: possible  pulmonary embolus  Allergies  Allergen Reactions  . Codeine Nausea Only  . Morphine And Related Nausea Only    Patient Measurements: Height: 5\' 6"  (167.6 cm) Weight: 124 lb (56.2 kg) IBW/kg (Calculated) : 59.3  Vital Signs: Temp: 99.3 F (37.4 C) (03/09 1134) Temp Source: Axillary (03/09 1134) BP: 122/85 (03/09 1100) Pulse Rate: 90 (03/09 1100)  Labs:  Recent Labs  05/23/16 0605  HGB 12.0  HCT 36.6  PLT 217  CREATININE 0.61  TROPONINI <0.03    Estimated Creatinine Clearance: 58.1 mL/min (by C-G formula based on SCr of 0.61 mg/dL).   Medical History: Past Medical History:  Diagnosis Date  . Asthma   . Cancer [facility]) 2010   Left ear cancer  . Diabetes mellitus without complication ([place])   . Hypertension     Assessment: 71 y/o F with a PMH of HTN, DM, ear CA, CKD III, CAD, and CVA admitted with SOB.   Goal of Therapy:  Monitor platelets by anticoagulation protocol: Yes   Plan:  Lovenox 1 mg/kg q 12 hours.   Ulice Dash D 05/23/2016,12:02 PM

## 2016-05-23 NOTE — H&P (Signed)
PULMONARY / CRITICAL CARE MEDICINE   Name: Kristin [name] MRN: [id number] DOB: 01-14-1946    ADMISSION DATE:  05/23/2016 CONSULTATION DATE: 05/23/2016  REFERRING MD: Dr. Owens Shark  CHIEF COMPLAINT: Shortness of Breath   HISTORY OF PRESENT ILLNESS:   This is a 71 yo female with a PMH of HTN, Diabetes Mellitus, Left Ear Cancer (2010), GERD, Hyperlipidemia, CKD stage 3 (GFR 30-59 ml/min), Hypothyroidism, Former Smoker, COPD, CAD, CVA, PVD, and Asthma.  She presented to [facility] ER 03/9 with history of left rib injury s/p fall 1 week prior to presentation to ER, and has developed worsening shortness of breath since the fall.  She remembers having a syncopal episode prior to the fall, she is unsure if she hit her left flank.  She does endorse a hx of syncopal episodes in the past.  Due to symptoms she notified EMS upon EMS arrival she was tachypneic and using accessary muscles to breath.  She was placed on Bipap, given iv solumedrol, and duonebs upon arrival to the ER.  PCCM contacted to admit pt to [facility] with acute on chronic respiratory failure secondary to AECOPD vs. ?Pulmonary Embolism requiring continuous Bipap.   PAST MEDICAL HISTORY :  She  has a past medical history of Asthma; Cancer ([place]) (2010); Diabetes mellitus without complication ([place]); and Hypertension.  PAST SURGICAL HISTORY: She  has no past surgical history on file.  Allergies  Allergen Reactions  . Codeine Nausea Only  . Morphine And Related Nausea Only    No current facility-administered medications on file prior to encounter.    Current Outpatient Prescriptions on File Prior to Encounter  Medication Sig  . aspirin EC 81 MG EC tablet Take 1 tablet (81 mg total) by mouth daily. (Patient taking differently: Take 162 mg by mouth daily. )  . atorvastatin (LIPITOR) 40 MG tablet Take 1 tablet by mouth daily at 6 PM.  . clonazePAM (KLONOPIN) 1 MG tablet Take 1 tablet by mouth daily.  . fenofibrate 160 MG tablet Take 1  tablet by mouth daily.  Marland Kitchen JANUMET XR [phone] MG TB24 Take 1 tablet by mouth daily.  Marland Kitchen levothyroxine (SYNTHROID, LEVOTHROID) 112 MCG tablet Take 1 tablet by mouth daily.  . pantoprazole (PROTONIX) 40 MG tablet Take 1 tablet by mouth 2 (two) times daily with breakfast and lunch.  . promethazine (PHENERGAN) 25 MG tablet Take 25 mg by mouth every 6 (six) hours as needed.   . traZODone (DESYREL) 100 MG tablet Take 100 mg by mouth at bedtime.   . VENTOLIN HFA 108 (90 Base) MCG/ACT inhaler Inhale 2 puffs into the lungs every 6 (six) hours as needed.     FAMILY HISTORY:  Her has no family status information on file.    SOCIAL HISTORY: She  reports that she has never smoked. She has never used smokeless tobacco. She reports that she does not drink alcohol or use drugs.  REVIEW OF SYSTEMS:   Unable to assess pt on Bipap  SUBJECTIVE:  Pt on Bipap states she is still short of breath and anxious  VITAL SIGNS: BP (!) 150/58   Pulse 90   Temp 98.3 F (36.8 C)   Resp (!) 24   Ht 5\' 6"  (1.676 m)   Wt 56.2 kg (124 lb)   SpO2 97%   BMI 20.01 kg/m   HEMODYNAMICS:    VENTILATOR SETTINGS: FiO2 (%):  [35 %] 35 %  INTAKE / OUTPUT: No intake/output data recorded.  PHYSICAL EXAMINATION: General: acutely ill  Caucasian female  Neuro: lethargic and oriented, anxious, follows commands, PERRLA HEENT: supple, mild JVD Cardiovascular: nsr, s1s2, no M/R/G Lungs: diminished throughout, no wheezes, rhonchi, or rales, labored, mildly tachypneic on Bipap Abdomen: +BS x4, soft, non tender, non distended Musculoskeletal: left flank pain with inspiration, normal bulk and tone no edema  Skin: intact no rashes or lesions  LABS:  BMET  Recent Labs Lab 05/23/16 0605  NA 140  K 3.7  CL 107  CO2 23  BUN 16  CREATININE 0.61  GLUCOSE 161*    Electrolytes  Recent Labs Lab 05/23/16 0605  CALCIUM 9.5    CBC  Recent Labs Lab 05/23/16 0605  WBC 11.9*  HGB 12.0  HCT 36.6  PLT 217     Coag's No results for input(s): APTT, INR in the last 168 hours.  Sepsis Markers No results for input(s): LATICACIDVEN, PROCALCITON, O2SATVEN in the last 168 hours.  ABG No results for input(s): PHART, PCO2ART, PO2ART in the last 168 hours.  Liver Enzymes No results for input(s): AST, ALT, ALKPHOS, BILITOT, ALBUMIN in the last 168 hours.  Cardiac Enzymes  Recent Labs Lab 05/23/16 0605  TROPONINI <0.03    Glucose No results for input(s): GLUCAP in the last 168 hours.  Imaging Dg Chest Port 1 View  Result Date: 05/23/2016 CLINICAL DATA:  Initial evaluation for code acute dyspnea, recent left rib injury. EXAM: PORTABLE CHEST 1 VIEW COMPARISON:  Prior radiograph from 10/22/2015. FINDINGS: Cardiac and mediastinal silhouettes are stable in size and contour, and remain within normal limits. Aortic atherosclerosis noted. Lungs are hypoinflated with mild bibasilar atelectasis. Emphysematous changes noted. No focal infiltrates. No pulmonary edema or pleural effusion. No pneumothorax. No acute osseous abnormality.  Diffuse osteopenia.  Cervical ACDF. IMPRESSION: 1. Shallow lung inflation with mild bibasilar subsegmental atelectasis. No other active cardiopulmonary disease. 2. Emphysema. 3. Aortic atherosclerosis. Electronically Signed   By: Jeannine Boga M.D.   On: 05/23/2016 06:19   STUDIES:  None   CULTURES: None   ANTIBIOTICS: None   SIGNIFICANT EVENTS: 03/9-Pt admitted to [facility] with acute on chronic respiratory failure secondary to AECOPD vs. ?Pulmonary Embolism requiring continuous Bipap    LINES/TUBES: PIV's>>  ASSESSMENT / PLAN:  PULMONARY A: Acute on chronic respiratory failure secondary to AECOPD vs. ?Pulmonary Embolism Mild bibasilar atelectasis on CXR 03/9 secondary to pleuritic chest pain s/p fall  ?RUL opacity 03/9 will follow with CXR   Hx: Asthma  P:   Supplemental O2 wean as tolerated Maintain O2 sats 90% to 94% Aggressive bronchodilator  therapy IV steroids Pulmonary hygiene  Repeat CXR in am  Ultrasound bilateral lower extremities today 03/9 Stat d-dimer  Pts respiratory status is too unstable at this time to tolerate CT Angio Chest to rule out PE if she becomes stable will obtain CT  Subq Lovenox for ?PE    CARDIOVASCULAR A:  Hypertension ?Pulmonary Embolism Hx: Hyperlipidemia, CAD, and CVA P:  Continuous telemetry monitoring Trend troponin's Continue outpatient aspirin, atorvastatin, and furosemide  RENAL A:   No acute issues  P:   Trend BMP's  Replace electrolytes as indicated Monitor UOP  GASTROINTESTINAL A:   No acute issues Hx: GERD, Gastroparesis, Esophageal Stricture, and Esophagitis P:   Continue outpatient protonix  Heart healthy diet once off Bipap   HEMATOLOGIC A:   ?Pulmonary Embolism  P:  Trend CBC's Subq lovenox for VTE prophylaxis Monitor for s/sx of bleeding Transfuse for hgb <7  INFECTIOUS A:   ?RUL opacity 03/9 will follow CXR  Mild Leukocytosis P:   Trend WBC's and monitor fever curve Will trend PCT's and start empiric abx if pt becomes febrile or develops worsening leukocytosis   ENDOCRINE A:   Hypothyroidism  P:   Continue outpatient synthroid   NEUROLOGIC A:   Acute Pain   Hx: Anxiety and Depression P:   Prn Tylenol and Toradol for pain management  Continue outpatient clonazepam    FAMILY  - Updates: Pts husband at bedside and updated regarding plan of care all questions answered   - Inter-disciplinary family meet or Palliative Care meeting due by: 05/30/2016   Marda Stalker, [place] Pager [phone] (please enter 7 digits) [place] Pager [phone] (please enter 7 digits)  PCCM ATTENDING ATTESTATION:  I have evaluated patient with the APP [place], reviewed database in its entirety and discussed care plan in detail. In addition, this patient was discussed on multidisciplinary rounds.   Patient presented to the  emergency department with acute respiratory distress and left-sided chest discomfort. She suffered a fall (by her description, sounds like a syncopal event) approximately one week prior to admission. Since that time she has had left-sided chest discomfort and increasing dyspnea. In the emergency department, she was treated as a COPD exacerbation. However, on my initial evaluation, there was no wheezing. She was tachypneic with obvious splinting on respirations. While on the BiPAP, her minute ventilation was extremely high (greater than 20 L/m)  Important exam findings: Cognition intact HEENT WNL No jugular venous distention noted Splinting on inspiration, no wheezes or other adventitious sounds Regular with no murmurs Abdomen soft with normal bowel sounds Extremities without edema  Major problems addressed by PCCM team: Acute respiratory distress COPD- previous CT scan from 2015 reveals moderate emphysema Left pleuritic or chest wall pain without any exam or radiographic findings consistent with trauma  Chest x-ray without acute abnormalities noted  Her history of possible syncope, left-sided chest discomfort, progressive dyspnea and respiratory distress raises the possibility of pulmonary embolism.   PLAN/REC: PRN BiPAP Supplemental oxygen Nonsteroidals for pleuritic chest pain - she looks much better after receiving this medication Empiric full anticoagulation with enoxaparin Lower extremity Dopplers ordered Echocardiogram to assess overall LV function, rule out pericardial effusion, assess pulmonary artery pressures Empiric bronchodilator therapy When she is less tachypneic, if we are still concerned about the possibility of pulmonary embolism, we will obtain a CT angiogram of her chest  CCM time: 40 mins The above time includes time spent in consultation with patient and/or family members and reviewing care plan on multidisciplinary rounds. In addition, patient was reevaluated on  several occasions throughout the day  Merton Border, MD PCCM service Mobile [phone] Pager [phone] 05/23/2016

## 2016-05-23 NOTE — Progress Notes (Signed)
Took over for [employer]. Patient alert and oriented, no reports of pain or shortness of breath on 4 L nasal cannula. Patient is only able to say a few words at a time though without audibly catching her breath. Female urinary external catheter in place.

## 2016-05-23 NOTE — ED Notes (Signed)
Called respiratory to assist with transport due to bipap

## 2016-05-23 NOTE — ED Provider Notes (Signed)
[facility] Emergency Department Provider Note   First MD Initiated Contact with Patient 05/23/16 [phone]     (approximate)  I have reviewed the triage vital signs and the nursing notes.   HISTORY  Chief Complaint Shortness of Breath    HPI Kristin [name] is a 71 y.o. female with bolus of chronic medical conditions including COPD presents to the emergency department with history of left rib injury status post fall 1 week ago with progressive dyspnea since that time. Patient denies any fever however does admit to considerable worsening of rest her symptoms tonight. Patient presents via EMS tachypnea with positive accessory muscle use speaking in 2-3 word phrases.   Past Medical History:  Diagnosis Date  . Asthma   . Cancer [facility]) 2010   Left ear cancer  . Diabetes mellitus without complication ([place])   . Hypertension     Patient Active Problem List   Diagnosis Date Noted  . Syncope 10/23/2015    No past surgical history on file.  Prior to Admission medications   Medication Sig Start Date End Date Taking? Authorizing Provider  aspirin EC 81 MG EC tablet Take 1 tablet (81 mg total) by mouth daily. Patient taking differently: Take 162 mg by mouth daily.  10/24/15  Yes Loletha Grayer, MD  atorvastatin (LIPITOR) 40 MG tablet Take 1 tablet by mouth daily at 6 PM. 10/12/15  Yes Historical Provider, MD  clonazePAM (KLONOPIN) 1 MG tablet Take 1 tablet by mouth daily. 10/01/15  Yes Historical Provider, MD  CRANBERRY EXTRACT PO Take 4,200 mg by mouth daily.   Yes Historical Provider, MD  fenofibrate 160 MG tablet Take 1 tablet by mouth daily. 10/06/15  Yes Historical Provider, MD  ferrous sulfate 325 (65 FE) MG tablet Take 325 mg by mouth daily.   Yes Historical Provider, MD  furosemide (LASIX) 40 MG tablet Take 40 mg by mouth daily. 08/03/15  Yes Historical Provider, MD  JANUMET XR [phone] MG TB24 Take 1 tablet by mouth daily. 10/06/15  Yes Historical Provider,  MD  levothyroxine (SYNTHROID, LEVOTHROID) 112 MCG tablet Take 1 tablet by mouth daily. 09/15/15  Yes Historical Provider, MD  Omega-3 Fatty Acids (FISH OIL) 1000 MG CAPS Take 1 capsule by mouth daily.   Yes Historical Provider, MD  pantoprazole (PROTONIX) 40 MG tablet Take 1 tablet by mouth 2 (two) times daily with breakfast and lunch. 10/20/15  Yes Historical Provider, MD  Potassium 99 MG TABS Take 198 mg by mouth daily.   Yes Historical Provider, MD  pramipexole (MIRAPEX) 0.25 MG tablet Take 0.25 mg by mouth at bedtime. 05/04/16  Yes Historical Provider, MD  promethazine (PHENERGAN) 25 MG tablet Take 25 mg by mouth every 6 (six) hours as needed.  09/14/15  Yes Historical Provider, MD  SPIRIVA HANDIHALER 18 MCG inhalation capsule Place 18 mcg into inhaler and inhale daily. 05/13/16  Yes Historical Provider, MD  traZODone (DESYREL) 100 MG tablet Take 100 mg by mouth at bedtime.  09/07/15  Yes Historical Provider, MD  VENTOLIN HFA 108 (90 Base) MCG/ACT inhaler Inhale 2 puffs into the lungs every 6 (six) hours as needed.  10/11/15  Yes Historical Provider, MD  vitamin B-12 (CYANOCOBALAMIN) 1000 MCG tablet Take 1,000 mcg by mouth daily.   Yes Historical Provider, MD    Allergies Codeine and Morphine and related  No family history on file.  Social History Social History  Substance Use Topics  . Smoking status: Never Smoker  . Smokeless tobacco:  Never Used  . Alcohol use No    Review of Systems Constitutional: No fever/chills Eyes: No visual changes. ENT: No sore throat. Cardiovascular: Positive for left chest wall pain Respiratory: Positive for shortness of breath. Gastrointestinal: No abdominal pain.  No nausea, no vomiting.  No diarrhea.  No constipation. Genitourinary: Negative for dysuria. Musculoskeletal: Negative for back pain. Skin: Negative for rash. Neurological: Negative for headaches, focal weakness or numbness.  10-point ROS otherwise  negative.  ____________________________________________   PHYSICAL EXAM:  VITAL SIGNS: ED Triage Vitals  Enc Vitals Group     BP 05/23/16 0601 (!) 179/87     Pulse Rate 05/23/16 0541 90     Resp 05/23/16 0541 (!) 32     Temp 05/23/16 0541 97.6 F (36.4 C)     Temp src --      SpO2 05/23/16 0541 96 %     Weight 05/23/16 0541 124 lb (56.2 kg)     Height 05/23/16 0541 5\' 6"  (1.676 m)     Head Circumference --      Peak Flow --      Pain Score 05/23/16 0542 10     Pain Loc --      Pain Edu? --      Excl. in [place]? --     Constitutional: Alert and oriented. Apparent respiratory distress Eyes: Conjunctivae are normal. PERRL. EOMI. Head: Atraumatic. Mouth/Throat: Mucous membranes are moist.  Oropharynx non-erythematous. Neck: No stridor.  Cardiovascular: Normal rate, regular rhythm. Good peripheral circulation. Grossly normal heart sounds. Pain with left chest wall palpation Respiratory: Tachypnea, positive accessory respiratory muscle usage, diffuse expiratory wheezing. Gastrointestinal: Soft and nontender. No distention.  Musculoskeletal: No lower extremity tenderness nor edema. No gross deformities of extremities. Neurologic:  Normal speech and language. No gross focal neurologic deficits are appreciated.  Skin:  Skin is warm, dry and intact. No rash noted. Psychiatric: Mood and affect are normal. Speech and behavior are normal.  ____________________________________________   LABS (all labs ordered are listed, but only abnormal results are displayed)  Labs Reviewed  BASIC METABOLIC PANEL - Abnormal; Notable for the following:       Result Value   Glucose, Bld 161 (*)    All other components within normal limits  CBC - Abnormal; Notable for the following:    WBC 11.9 (*)    All other components within normal limits  TROPONIN I  BLOOD GAS, ARTERIAL   ____________________________________________  EKG  ED ECG REPORT I, [place] N Ikaika Showers, the attending physician,  personally viewed and interpreted this ECG.   Date: 05/23/2016  EKG Time: 5:47 AM  Rate: 87  Rhythm: Normal sinus rhythm  Axis: Normal  Intervals: Normal  ST&T Change: None  ____________________________________________  RADIOLOGY I, [place] Martyna Thorns, personally viewed and evaluated these images (plain radiographs) as part of my medical decision making, as well as reviewing the written report by the radiologist.  Dg Chest Port 1 View  Result Date: 05/23/2016 CLINICAL DATA:  Initial evaluation for code acute dyspnea, recent left rib injury. EXAM: PORTABLE CHEST 1 VIEW COMPARISON:  Prior radiograph from 10/22/2015. FINDINGS: Cardiac and mediastinal silhouettes are stable in size and contour, and remain within normal limits. Aortic atherosclerosis noted. Lungs are hypoinflated with mild bibasilar atelectasis. Emphysematous changes noted. No focal infiltrates. No pulmonary edema or pleural effusion. No pneumothorax. No acute osseous abnormality.  Diffuse osteopenia.  Cervical ACDF. IMPRESSION: 1. Shallow lung inflation with mild bibasilar subsegmental atelectasis. No other active cardiopulmonary disease. 2.  Emphysema. 3. Aortic atherosclerosis. Electronically Signed   By: Jeannine Boga M.D.   On: 05/23/2016 06:19    Procedures   Critical Care performed: CRITICAL CARE Performed by: Gregor Hams   Total critical care time: 45 minutes  Critical care time was exclusive of separately billable procedures and treating other patients.  Critical care was necessary to treat or prevent imminent or life-threatening deterioration.  Critical care was time spent personally by me on the following activities: development of treatment plan with patient and/or surrogate as well as nursing, discussions with consultants, evaluation of patient's response to treatment, examination of patient, obtaining history from patient or surrogate, ordering and performing treatments and interventions, ordering  and review of laboratory studies, ordering and review of radiographic studies, pulse oximetry and re-evaluation of patient's condition.   INITIAL IMPRESSION / ASSESSMENT AND PLAN / ED COURSE  Pertinent labs & imaging results that were available during my care of the patient were reviewed by me and considered in my medical decision making (see chart for details).  Patient given 3 DuoNeb's, Solu-Medrol 125 mg shortly after arrival to the emergency department however continues to have respiratory difficulty. Patient tachypnea This time with a respiratory rate of 36, positive rest or accessory muscle use. As such patient will be placed on BiPAP at this time with concern of potential respiratory failure. Patient discussed with Dr. Shawna Orleans intensivist on call for hospital admission for further evaluation and management.      ____________________________________________  FINAL CLINICAL IMPRESSION(S) / ED DIAGNOSES  Final diagnoses:  COPD exacerbation ([place])  Contusion of left chest wall, initial encounter     MEDICATIONS GIVEN DURING THIS VISIT:  Medications  ipratropium-albuterol (DUONEB) 0.5-2.5 (3) MG/3ML nebulizer solution 3 mL (3 mLs Nebulization Given 05/23/16 0556)  ipratropium-albuterol (DUONEB) 0.5-2.5 (3) MG/3ML nebulizer solution 3 mL (3 mLs Nebulization Given 05/23/16 0555)  ipratropium-albuterol (DUONEB) 0.5-2.5 (3) MG/3ML nebulizer solution 3 mL (3 mLs Nebulization Given 05/23/16 0618)  morphine 2 MG/ML injection 2 mg (2 mg Intravenous Given 05/23/16 0618)  ondansetron (ZOFRAN) injection 4 mg (4 mg Intravenous Given 05/23/16 0618)  methylPREDNISolone sodium succinate (SOLU-MEDROL) 125 mg/2 mL injection 125 mg (125 mg Intravenous Given 05/23/16 0618)  albuterol (PROVENTIL) (2.5 MG/3ML) 0.083% nebulizer solution (7.5 mg  Given 05/23/16 0746)     NEW OUTPATIENT MEDICATIONS STARTED DURING THIS VISIT:  New Prescriptions   No medications on file    Modified Medications   No medications on  file    Discontinued Medications   AMOXICILLIN-CLAVULANATE (AUGMENTIN) 500-125 MG TABLET    Take 1 tablet by mouth 2 (two) times daily.     Note:  This document was prepared using Dragon voice recognition software and may include unintentional dictation errors.    Gregor Hams, MD 05/23/16 [phone]

## 2016-05-23 NOTE — Progress Notes (Signed)
Spoke with Ms. Marda Stalker NP about patient reporting that she had not had a bowel movement in a week and that she wanted to eat something. Ms. Melene Muller ordered a pharmacy consult for constipation (informed Pharmacist Scott in person) and a Clear Liquid Diet to advance as tolerated. Team will continue to monitor.

## 2016-05-24 ENCOUNTER — Encounter: Payer: Self-pay | Admitting: Radiology

## 2016-05-24 ENCOUNTER — Inpatient Hospital Stay: Payer: Medicare Other

## 2016-05-24 ENCOUNTER — Inpatient Hospital Stay
Admit: 2016-05-24 | Discharge: 2016-05-24 | Disposition: A | Discharge status: Home / Self Care | Payer: Medicare Other | Attending: Pulmonary Disease | Admitting: Pulmonary Disease

## 2016-05-24 DIAGNOSIS — R55 Syncope and collapse: Secondary | ICD-10-CM

## 2016-05-24 DIAGNOSIS — R071 Chest pain on breathing: Secondary | ICD-10-CM

## 2016-05-24 LAB — GLUCOSE, CAPILLARY
GLUCOSE-CAPILLARY: 114 mg/dL — AB (ref 65–99)
GLUCOSE-CAPILLARY: 239 mg/dL — AB (ref 65–99)
Glucose-Capillary: 101 mg/dL — ABNORMAL HIGH (ref 65–99)
Glucose-Capillary: 131 mg/dL — ABNORMAL HIGH (ref 65–99)
Glucose-Capillary: 146 mg/dL — ABNORMAL HIGH (ref 65–99)
Glucose-Capillary: 92 mg/dL (ref 65–99)

## 2016-05-24 LAB — CBC WITH DIFFERENTIAL/PLATELET
Basophils Absolute: 0 10*3/uL (ref 0–0.1)
Basophils Relative: 0 %
EOS PCT: 0 %
Eosinophils Absolute: 0 10*3/uL (ref 0–0.7)
HCT: 32.5 % — ABNORMAL LOW (ref 35.0–47.0)
Hemoglobin: 11.1 g/dL — ABNORMAL LOW (ref 12.0–16.0)
LYMPHS ABS: 1.3 10*3/uL (ref 1.0–3.6)
LYMPHS PCT: 16 %
MCH: 32 pg (ref 26.0–34.0)
MCHC: 34.3 g/dL (ref 32.0–36.0)
MCV: 93.4 fL (ref 80.0–100.0)
Monocytes Absolute: 0.4 10*3/uL (ref 0.2–0.9)
Monocytes Relative: 5 %
NEUTROS ABS: 6.8 10*3/uL — AB (ref 1.4–6.5)
Neutrophils Relative %: 79 %
PLATELETS: 219 10*3/uL (ref 150–440)
RBC: 3.48 MIL/uL — AB (ref 3.80–5.20)
RDW: 13.4 % (ref 11.5–14.5)
WBC: 8.5 10*3/uL (ref 3.6–11.0)

## 2016-05-24 LAB — BASIC METABOLIC PANEL
Anion gap: 6 (ref 5–15)
BUN: 15 mg/dL (ref 6–20)
CHLORIDE: 108 mmol/L (ref 101–111)
CO2: 25 mmol/L (ref 22–32)
Calcium: 9.3 mg/dL (ref 8.9–10.3)
Creatinine, Ser: 0.71 mg/dL (ref 0.44–1.00)
GFR calc Af Amer: >60 mL/min (ref >60)
GLUCOSE: 146 mg/dL — AB (ref 65–99)
POTASSIUM: 3.8 mmol/L (ref 3.5–5.1)
Sodium: 139 mmol/L (ref 135–145)

## 2016-05-24 LAB — TROPONIN I

## 2016-05-24 MED ORDER — PANTOPRAZOLE SODIUM 40 MG PO TBEC
40.0000 mg | DELAYED_RELEASE_TABLET | Freq: Every day | ORAL | Status: DC
  Administered 2016-05-25 – 2016-05-26 (×2): 40 mg via ORAL
  Filled 2016-05-24 (×2): qty 1

## 2016-05-24 MED ORDER — ARFORMOTEROL TARTRATE 15 MCG/2ML IN NEBU
15.0000 ug | INHALATION_SOLUTION | Freq: Two times a day (BID) | RESPIRATORY_TRACT | Status: DC
  Administered 2016-05-24 – 2016-05-26 (×4): 15 ug via RESPIRATORY_TRACT
  Filled 2016-05-24 (×6): qty 2

## 2016-05-24 MED ORDER — POTASSIUM CHLORIDE CRYS ER 20 MEQ PO TBCR
40.0000 meq | EXTENDED_RELEASE_TABLET | Freq: Once | ORAL | Status: AC
  Administered 2016-05-24: 40 meq via ORAL
  Filled 2016-05-24: qty 2

## 2016-05-24 MED ORDER — INSULIN ASPART 100 UNIT/ML ~~LOC~~ SOLN
0.0000 [IU] | Freq: Three times a day (TID) | SUBCUTANEOUS | Status: DC
Start: 2016-05-24 — End: 2016-05-26
  Administered 2016-05-24: 1 [IU] via SUBCUTANEOUS
  Administered 2016-05-24: 3 [IU] via SUBCUTANEOUS
  Administered 2016-05-25: 2 [IU] via SUBCUTANEOUS
  Administered 2016-05-25: 1 [IU] via SUBCUTANEOUS
  Administered 2016-05-25: 3 [IU] via SUBCUTANEOUS
  Administered 2016-05-26: 2 [IU] via SUBCUTANEOUS
  Filled 2016-05-24: qty 2
  Filled 2016-05-24: qty 1
  Filled 2016-05-24 (×2): qty 2
  Filled 2016-05-24 (×2): qty 3

## 2016-05-24 MED ORDER — BUDESONIDE 0.25 MG/2ML IN SUSP
0.2500 mg | Freq: Two times a day (BID) | RESPIRATORY_TRACT | Status: DC
  Administered 2016-05-24 – 2016-05-26 (×4): 0.25 mg via RESPIRATORY_TRACT
  Filled 2016-05-24 (×4): qty 2

## 2016-05-24 MED ORDER — ONDANSETRON HCL 4 MG/2ML IJ SOLN
4.0000 mg | Freq: Four times a day (QID) | INTRAMUSCULAR | Status: DC | PRN
  Administered 2016-05-24 – 2016-05-25 (×2): 4 mg via INTRAVENOUS
  Filled 2016-05-24 (×2): qty 2

## 2016-05-24 MED ORDER — ENOXAPARIN SODIUM 40 MG/0.4ML ~~LOC~~ SOLN
40.0000 mg | SUBCUTANEOUS | Status: DC
  Administered 2016-05-24 – 2016-05-25 (×2): 40 mg via SUBCUTANEOUS
  Filled 2016-05-24 (×2): qty 0.4

## 2016-05-24 MED ORDER — IPRATROPIUM-ALBUTEROL 0.5-2.5 (3) MG/3ML IN SOLN: 3.0000 mL | RESPIRATORY_TRACT | Status: DC | PRN

## 2016-05-24 MED ORDER — IOPAMIDOL (ISOVUE-370) INJECTION 76%
75.0000 mL | Freq: Once | INTRAVENOUS | Status: AC | PRN
  Administered 2016-05-24: 75 mL via INTRAVENOUS

## 2016-05-24 MED ORDER — PROMETHAZINE HCL 25 MG/ML IJ SOLN
12.5000 mg | Freq: Four times a day (QID) | INTRAMUSCULAR | Status: DC | PRN
  Administered 2016-05-24 – 2016-05-25 (×2): 12.5 mg via INTRAVENOUS
  Filled 2016-05-24 (×2): qty 1

## 2016-05-24 MED ORDER — FUROSEMIDE 20 MG PO TABS
20.0000 mg | ORAL_TABLET | Freq: Every day | ORAL | Status: DC
  Administered 2016-05-25 – 2016-05-26 (×2): 20 mg via ORAL
  Filled 2016-05-24 (×2): qty 1

## 2016-05-24 NOTE — Progress Notes (Signed)
[place] for Lovenox Indication: possible  pulmonary embolus  Allergies  Allergen Reactions  . Codeine Nausea Only  . Morphine And Related Nausea Only    Patient Measurements: Height: 5\' 6"  (167.6 cm) Weight: 124 lb (56.2 kg) IBW/kg (Calculated) : 59.3  Vital Signs: Temp: 98.4 F (36.9 C) (03/10 0800) Temp Source: Axillary (03/10 0800) BP: 142/82 (03/10 1200) Pulse Rate: 79 (03/10 1200)  Labs:  Recent Labs  05/23/16 0605 05/23/16 1218 05/23/16 1737 05/24/16 0027  HGB 12.0  --   --  11.1*  HCT 36.6  --   --  32.5*  PLT 217  --   --  219  APTT  --  30  --   --   LABPROT  --  12.4  --   --   INR  --  0.92  --   --   CREATININE 0.61  --   --  0.71  TROPONINI <0.03 <0.03 <0.03 <0.03    Estimated Creatinine Clearance: 58.1 mL/min (by C-G formula based on SCr of 0.71 mg/dL).   Medical History: Past Medical History:  Diagnosis Date  . Asthma   . Cancer [facility]) 2010   Left ear cancer  . Diabetes mellitus without complication ([place])   . Hypertension     Assessment: 71 y/o F with a PMH of HTN, DM, ear CA, CKD III, CAD, and CVA admitted with SOB.   Goal of Therapy:  Monitor platelets by anticoagulation protocol: Yes   Plan:  Lovenox 1 mg/kg q 12 hours.   Hezakiah Champeau D 05/24/2016,2:49 PM

## 2016-05-24 NOTE — Progress Notes (Signed)
Pt external cath removed, received sliding scale insulin and tray being transferred with patient to room 154. Husband at bedside.

## 2016-05-24 NOTE — Progress Notes (Addendum)
Left-sided chest pain is nearly completely resolved. There is no respiratory distress. No new complaints.  Vitals:   05/24/16 0900 05/24/16 1000 05/24/16 1100 05/24/16 1200  BP: 138/81 (!) 154/65 (!) 155/64 (!) 142/82  Pulse: 88 84 82 79  Resp: (!) 24 (!) 25 (!) 21 (!) 31  Temp:      TempSrc:      SpO2: 93% 95% 96% 95%  Weight:      Height:        Gen: NAD HEENT: All WNL Neck: NO LAN, no JVD noted Lungs: Moderately decreased breath sounds, percussion normal, No wheezes, Cardiovascular: Reg, no M Abdomen: Soft, NT +BS Ext: no C/C/E Neuro: CNs intact, motor/sens grossly intact Skin: No lesions noted  BMP Latest Ref Rng & Units 05/24/2016 05/23/2016 10/24/2015  Glucose 65 - 99 mg/dL 146(H) 161(H) 114(H)  BUN 6 - 20 mg/dL 15 16 18   Creatinine 0.44 - 1.00 mg/dL 0.71 0.61 1.10(H)  Sodium 135 - 145 mmol/L 139 140 142  Potassium 3.5 - 5.1 mmol/L 3.8 3.7 4.1  Chloride 101 - 111 mmol/L 108 107 112(H)  CO2 22 - 32 mmol/L 25 23 25   Calcium 8.9 - 10.3 mg/dL 9.3 9.5 8.5(L)   CBC Latest Ref Rng & Units 05/24/2016 05/23/2016 10/23/2015  WBC 3.6 - 11.0 K/uL 8.5 11.9(H) 6.8  Hemoglobin 12.0 - 16.0 g/dL 11.1(L) 12.0 10.5(L)  Hematocrit 35.0 - 47.0 % 32.5(L) 36.6 30.1(L)  Platelets 150 - 440 K/uL 219 217 209   CXR: NACPD  Impression: Syncopal event one week prior to admission-never evaluated Acute respiratory distress Severe left-sided chest pain-now much improved  No evidence of chest trauma COPD-by previous CT scan, this appears to be mild to moderate No wheezes present on initial examination  I do not think her initial presentation is explained  Overall she is markedly improved this morning. Chest pain is nearly resolved. I suspect that improvement is attributable to ketorolac. I remain concerned about the possibility of pulmonary embolism which could explain her syncopal event one week ago and her persistent left-sided pleurodynia  PLAN/REC: Continue supplemental oxygen oxygen to keep her  saturations greater than 90% Continue nebulized bronchodilators Continue full anticoagulation for now CT angiogram of chest ordered Transfer to [place] floor [facility] hospitalists to assume primary duties after transfer. Discussed with Dr. Darvin Neighbours Recent episode of syncope probably warrants further evaluation  Merton Border, MD PCCM service Mobile [phone] Pager [phone] 05/24/2016  ADD: CT chest reveals no PE and only minimal L>B basilar opacities. She has no other supporting evidence for PNA. I think these CT findings represent dependent atelectasis  Merton Border, MD PCCM service Mobile [phone] Pager [phone] 05/24/2016

## 2016-05-24 NOTE — Progress Notes (Signed)
Called MD to confirm transfer to Med Surg. Order placed and pt will be transferred to room 154 on 1A. Report called to 1A. Husband in room and aware of transfer.

## 2016-05-24 NOTE — Progress Notes (Signed)
Patient complaining of nausea and vomiting. MD notified. Orders pending.

## 2016-05-24 NOTE — Progress Notes (Signed)
Transferred from ICU. Fall recently and left chest pain. CXR clear. Treating for COPD exacerbation vs PE. On anti coagulation emperically. CT chest for PE today as she has been unstable for it till now. Normal troponin and no rib fractures.  Discussed with Dr. Alva Garnet of pulmonary

## 2016-05-24 NOTE — Progress Notes (Signed)
Asked Dr. Alva Garnet for medication for nausea. Pt requested medication. MD to put in order.

## 2016-05-25 LAB — GLUCOSE, CAPILLARY
GLUCOSE-CAPILLARY: 126 mg/dL — AB (ref 65–99)
GLUCOSE-CAPILLARY: 156 mg/dL — AB (ref 65–99)
GLUCOSE-CAPILLARY: 232 mg/dL — AB (ref 65–99)
Glucose-Capillary: 89 mg/dL (ref 65–99)

## 2016-05-25 LAB — ECHOCARDIOGRAM COMPLETE
Height: 66 in
Weight: 1984 oz

## 2016-05-25 MED ORDER — LEVOFLOXACIN IN D5W 750 MG/150ML IV SOLN
750.0000 mg | INTRAVENOUS | Status: DC
  Administered 2016-05-25: 750 mg via INTRAVENOUS
  Filled 2016-05-25 (×2): qty 150

## 2016-05-25 MED ORDER — KETOROLAC TROMETHAMINE 15 MG/ML IJ SOLN
30.0000 mg | Freq: Once | INTRAMUSCULAR | Status: AC
  Administered 2016-05-25: 30 mg via INTRAVENOUS
  Filled 2016-05-25: qty 2

## 2016-05-25 MED ORDER — METOCLOPRAMIDE HCL 5 MG/ML IJ SOLN
5.0000 mg | Freq: Three times a day (TID) | INTRAMUSCULAR | Status: DC
  Administered 2016-05-25 – 2016-05-26 (×4): 5 mg via INTRAVENOUS
  Filled 2016-05-25 (×4): qty 2

## 2016-05-25 MED ORDER — KETOROLAC TROMETHAMINE 15 MG/ML IJ SOLN: 15.0000 mg | Freq: Three times a day (TID) | INTRAMUSCULAR | Status: DC | PRN

## 2016-05-25 MED ORDER — METHYLPREDNISOLONE SODIUM SUCC 125 MG IJ SOLR
60.0000 mg | INTRAMUSCULAR | Status: DC
  Administered 2016-05-25 – 2016-05-26 (×2): 60 mg via INTRAVENOUS
  Filled 2016-05-25 (×2): qty 2

## 2016-05-25 NOTE — Progress Notes (Addendum)
Pharmacy Antibiotic Note  Kristin [name] is a 71 y.o. female admitted on 05/23/2016 with pneumonia.  Pharmacy has been consulted for levofloxacin dosing. Pt afebrile, WBC 3/10 = 8.5  Plan: Begin levofloxacin 750 mg IV q 24 hours. Recommended duration is 5 days. Will transition to PO when appropriate, per RN note pt N/V  Height: 5\' 6"  (167.6 cm) Weight: 124 lb (56.2 kg) IBW/kg (Calculated) : 59.3  Temp (24hrs), Avg:97.9 F (36.6 C), Min:97.5 F (36.4 C), Max:98.1 F (36.7 C)   Recent Labs Lab 05/23/16 0605 05/24/16 0027  WBC 11.9* 8.5  CREATININE 0.61 0.71    Estimated Creatinine Clearance: 58.1 mL/min (by C-G formula based on SCr of 0.71 mg/dL).    Allergies  Allergen Reactions  . Codeine Nausea Only  . Morphine And Related Nausea Only    Antimicrobials this admission: levofloxacin 3/11 >>   Dose adjustments this admission: 3/12 Levaquin changed to 750 mg q 48 hours for CrCl 45 mL/min.  Microbiology results:  BCx:   UCx:    Sputum:   3/9 MRSA PCR: negative  Thank you for allowing pharmacy to be a part of this patient's care.  Darrow Bussing, PharmD Pharmacy Resident 05/25/2016 9:28 AM

## 2016-05-25 NOTE — Progress Notes (Signed)
Patient a&o, vss. O2 4L chronic. Husband at bedside. Resting in bed. Continue to monitor.

## 2016-05-25 NOTE — Progress Notes (Signed)
[facility] [place] NAME: Kristin [name]    MR#:  [id number]  DATE OF BIRTH:  May 01, 1945  SUBJECTIVE:  CHIEF COMPLAINT:   Chief Complaint  Patient presents with  . Shortness of Breath    REVIEW OF SYSTEMS:  Review of Systems  Constitutional: Positive for malaise/fatigue. Negative for chills and fever.  HENT: Negative for congestion, ear discharge, hearing loss and nosebleeds.   Eyes: Negative for blurred vision and double vision.  Respiratory: Positive for cough and shortness of breath. Negative for wheezing.   Cardiovascular: Positive for chest pain. Negative for palpitations and leg swelling.  Gastrointestinal: Positive for nausea. Negative for abdominal pain, constipation, diarrhea and vomiting.  Genitourinary: Negative for dysuria.  Musculoskeletal: Positive for myalgias.  Neurological: Negative for dizziness, sensory change, speech change, focal weakness, seizures and headaches.  Psychiatric/Behavioral: Negative for depression.    DRUG ALLERGIES:   Allergies  Allergen Reactions  . Codeine Nausea Only  . Morphine And Related Nausea Only    VITALS:  Blood pressure (!) 138/53, pulse 71, temperature 98.1 F (36.7 C), temperature source Oral, resp. rate 18, height 5\' 6"  (1.676 m), weight 56.2 kg (124 lb), SpO2 93 %.  PHYSICAL EXAMINATION:  Physical Exam  GENERAL:  71 y.o.-year-old patient lying in the bed with no acute distress.  EYES: Pupils equal, round, reactive to light and accommodation. No scleral icterus. Extraocular muscles intact.  HEENT: Head atraumatic, normocephalic. Oropharynx and nasopharynx clear.  NECK:  Supple, no jugular venous distention. No thyroid enlargement, no tenderness.  LUNGS: scant breath sounds bilaterally, no rales,rhonchi or crepitation. No use of accessory muscles of respiration.  CARDIOVASCULAR: S1, S2 normal. No murmurs, rubs, or gallops.  ABDOMEN: Soft, nontender, nondistended. Bowel  sounds present. No organomegaly or mass.  EXTREMITIES: No pedal edema, cyanosis, or clubbing.  NEUROLOGIC: Cranial nerves II through XII are intact. Muscle strength 5/5 in all extremities. Sensation intact. Gait not checked.  PSYCHIATRIC: The patient is alert and oriented x 3.  SKIN: No obvious rash, lesion, or ulcer.    LABORATORY PANEL:   CBC  Recent Labs Lab 05/24/16 0027  WBC 8.5  HGB 11.1*  HCT 32.5*  PLT 219   ------------------------------------------------------------------------------------------------------------------  Chemistries   Recent Labs Lab 05/24/16 0027  NA 139  K 3.8  CL 108  CO2 25  GLUCOSE 146*  BUN 15  CREATININE 0.71  CALCIUM 9.3   ------------------------------------------------------------------------------------------------------------------  Cardiac Enzymes  Recent Labs Lab 05/24/16 0027  TROPONINI <0.03   ------------------------------------------------------------------------------------------------------------------  RADIOLOGY:  Ct Angio Chest Pe W Or Wo Contrast  Result Date: 05/24/2016 CLINICAL DATA:  Left-sided chest pain for 1 week with shortness of Breath EXAM: CT ANGIOGRAPHY CHEST WITH CONTRAST TECHNIQUE: Multidetector CT imaging of the chest was performed using the standard protocol during bolus administration of intravenous contrast. Multiplanar CT image reconstructions and MIPs were obtained to evaluate the vascular anatomy. CONTRAST:  75 mL Isovue 370. COMPARISON:  None. FINDINGS: Cardiovascular: Thoracic aorta shows no evidence of aneurysmal dilatation or dissection. Diffuse atherosclerotic calcifications are noted. The pulmonary artery shows a normal branching pattern without filling defect to suggest pulmonary embolism. Coronary calcifications are noted. No significant cardiac enlargement is noted. Mediastinum/Nodes: No significant hilar or mediastinal adenopathy is noted. Thoracic inlet is within normal limits.  Lungs/Pleura: Lungs are well aerated bilaterally. Mild bilateral lower lobe infiltrate is noted left slightly greater than right. No sizable effusion is seen. No parenchymal nodules are noted. Upper Abdomen: No acute  abnormality. Musculoskeletal: No chest wall abnormality. No acute or significant osseous findings. Review of the MIP images confirms the above findings. IMPRESSION: No evidence of pulmonary emboli. Bilateral lower lobe infiltrate left greater than right. Electronically Signed   By: Inez Catalina M.D.   On: 05/24/2016 12:15   US Venous Img Lower Bilateral  Result Date: 05/23/2016 CLINICAL DATA:  Acute respiratory failure EXAM: BILATERAL LOWER EXTREMITY VENOUS DOPPLER ULTRASOUND TECHNIQUE: Gray-scale sonography with graded compression, as well as color Doppler and duplex ultrasound were performed to evaluate the lower extremity deep venous systems from the level of the common femoral vein and including the common femoral, femoral, profunda femoral, popliteal and calf veins including the posterior tibial, peroneal and gastrocnemius veins when visible. The superficial great saphenous vein was also interrogated. Spectral Doppler was utilized to evaluate flow at rest and with distal augmentation maneuvers in the common femoral, femoral and popliteal veins. COMPARISON:  None. FINDINGS: RIGHT LOWER EXTREMITY Common Femoral Vein: No evidence of thrombus. Normal compressibility, respiratory phasicity and response to Valsalva. Saphenofemoral Junction: No evidence of thrombus. Normal compressibility and flow on color Doppler imaging. Profunda Femoral Vein: No evidence of thrombus. Normal compressibility and flow on color Doppler imaging. Femoral Vein: No evidence of thrombus. Normal compressibility, respiratory phasicity and response to augmentation. Popliteal Vein: No evidence of thrombus. Normal compressibility, respiratory phasicity and response to augmentation. Calf Veins: No evidence of thrombus. Normal  compressibility and flow on color Doppler imaging. Superficial Great Saphenous Vein: No evidence of thrombus. Normal compressibility and flow on color Doppler imaging. Venous Reflux:  Not evaluated Other Findings:  None. LEFT LOWER EXTREMITY Common Femoral Vein: No evidence of thrombus. Normal compressibility, respiratory phasicity and response to augmentation. Saphenofemoral Junction: Overlying bandaging limits evaluation. No evidence of thrombus. Normal compressibility and flow on color Doppler imaging. Profunda Femoral Vein: No evidence of thrombus. Normal compressibility and flow on color Doppler imaging. Femoral Vein: No evidence of thrombus. Normal compressibility, respiratory phasicity and response to augmentation. Popliteal Vein: No evidence of thrombus. Normal compressibility, respiratory phasicity and response to augmentation. Calf Veins: No evidence of thrombus. Normal compressibility and flow on color Doppler imaging. Superficial Great Saphenous Vein: No evidence of thrombus. Normal compressibility and flow on color Doppler imaging. Venous Reflux:  Not evaluated Other Findings:  None. IMPRESSION: No evidence of deep venous thrombosis. Electronically Signed   By: Ashley Royalty M.D.   On: 05/23/2016 18:26   Dg Chest Port 1 View  Result Date: 05/24/2016 CLINICAL DATA:  Acute respiratory failure EXAM: PORTABLE CHEST 1 VIEW COMPARISON:  Chest x-rays dated 05/23/2016 in 10/22/2015. FINDINGS: Heart size and mediastinal contours stable. Atherosclerotic changes noted at the aortic arch. Lungs are at least mildly hyperexpanded suggesting COPD. Probable mild atelectasis and/or scarring at the left lung base. Lungs otherwise clear. No pleural effusion or pneumothorax seen. IMPRESSION: 1. No active disease.  No evidence of pneumonia or pulmonary edema. 2. COPD/emphysema. 3. Aortic atherosclerosis. Electronically Signed   By: Franki Cabot M.D.   On: 05/24/2016 07:00    EKG:   Orders placed or performed during  the hospital encounter of 05/23/16  . EKG 12-Lead  . EKG 12-Lead    ASSESSMENT AND PLAN:   71 year old female with past medical history significant for hypertension, diabetes, CK D stage III, COPD on 4 L home oxygen, CAD and peripheral vascular disease comes to the hospital secondary to sudden onset of shortness of breath.  #1 acute on chronic hypoxic respiratory failure-secondary to COPD exacerbation and  bibasilar pneumonia. -CT of the chest negative for pulmonary embolism. Patient required BiPAP initially on admission. -Currently back on her home oxygen of 4 L. -Added Levaquin today. Also continue steroids and nebulizer treatments. -Incentive spirometry. -Also has left-sided chest pain from fall and contusion. No rib fracture noted. -Appreciate pulmonary consult  #2 diabetes mellitus-check A1c and on sliding scale insulin  #3 chronic nausea-secondary to gastroparesis. Following with a GI doctor as outpatient. -Added Reglan prior to meals. Also continue Phenergan PRN  #4 Hypothyroidism-continue Synthroid  #5 hyperlipidemia-continue home medications  #6 DVT prophylaxis-on Lovenox  Physical therapy consulted   All the records are reviewed and case discussed with Care Management/Social Workerr. Management plans discussed with the patient, family and they are in agreement.  CODE STATUS: Full code  TOTAL TIME TAKING CARE OF THIS PATIENT: 37 minutes.   POSSIBLE D/C IN 1-2 DAYS, DEPENDING ON CLINICAL CONDITION.   Gladstone Lighter M.D on 05/25/2016 at 12:18 PM  Between 7am to 6pm - Pager - [phone]  After 6pm go to [email or web address] - password [facility]  Office  [phone]  CC: Primary care physician; Rubye Beach, MD

## 2016-05-26 LAB — BASIC METABOLIC PANEL
ANION GAP: 6 (ref 5–15)
BUN: 31 mg/dL — ABNORMAL HIGH (ref 6–20)
CALCIUM: 9.4 mg/dL (ref 8.9–10.3)
CO2: 28 mmol/L (ref 22–32)
Chloride: 104 mmol/L (ref 101–111)
Creatinine, Ser: 1.03 mg/dL — ABNORMAL HIGH (ref 0.44–1.00)
GFR calc Af Amer: >60 mL/min (ref >60)
GFR, EST NON AFRICAN AMERICAN: 54 mL/min — AB (ref >60)
GLUCOSE: 124 mg/dL — AB (ref 65–99)
Potassium: 3.8 mmol/L (ref 3.5–5.1)
Sodium: 138 mmol/L (ref 135–145)

## 2016-05-26 LAB — GLUCOSE, CAPILLARY: Glucose-Capillary: 180 mg/dL — ABNORMAL HIGH (ref 65–99)

## 2016-05-26 MED ORDER — KETOROLAC TROMETHAMINE 10 MG PO TABS: 10.0000 mg | ORAL_TABLET | Freq: Four times a day (QID) | ORAL | 0 refills | Status: DC | PRN

## 2016-05-26 MED ORDER — IPRATROPIUM-ALBUTEROL 0.5-2.5 (3) MG/3ML IN SOLN: 3.0000 mL | RESPIRATORY_TRACT | 0 refills | Status: DC | PRN

## 2016-05-26 MED ORDER — PREDNISONE 10 MG (21) PO TBPK: ORAL_TABLET | ORAL | 0 refills | Status: DC

## 2016-05-26 MED ORDER — FLUTICASONE-SALMETEROL 250-50 MCG/DOSE IN AEPB: 1.0000 | INHALATION_SPRAY | Freq: Two times a day (BID) | RESPIRATORY_TRACT | 0 refills | Status: DC

## 2016-05-26 MED ORDER — LEVOFLOXACIN 500 MG PO TABS: 500.0000 mg | ORAL_TABLET | Freq: Every day | ORAL | 0 refills | Status: DC

## 2016-05-26 MED ORDER — LEVOFLOXACIN IN D5W 750 MG/150ML IV SOLN: 750.0000 mg | INTRAVENOUS | Status: DC

## 2016-05-26 MED ORDER — METOCLOPRAMIDE HCL 5 MG PO TABS: 5.0000 mg | ORAL_TABLET | Freq: Three times a day (TID) | ORAL | 1 refills | Status: DC

## 2016-05-26 NOTE — Evaluation (Signed)
Physical Therapy Evaluation Patient Details Name: Kristin [name] MRN: [id number] DOB: 1945-08-19 Today's Date: 05/26/2016   History of Present Illness  Pt admitted for complaints of L side rib pain s/p fall and now has difficulty breathing. Pt on 4L of O2 at baseline. Pt reports she feels great today. PMH includes HTN, DM, GERD, COPD, and CKD stage 3.   Clinical Impression  Pt is a pleasant 71 year old female who was admitted for difficulty breathing. Pt demonstrates all bed mobility/transfers/ambulation at baseline level while on 4L of O2. No AD required at this time. Safe technique with toileting and cleaning with no LOB noted. Pt does not require any further PT needs at this time. Pt will be dc in house and does not require follow up. RN aware. Will dc current orders.      Follow Up Recommendations No PT follow up    Equipment Recommendations  None recommended by PT    Recommendations for Other Services       Precautions / Restrictions Precautions Precautions: Fall Restrictions Weight Bearing Restrictions: No      Mobility  Bed Mobility               General bed mobility comments: not performed as pt received on [facility]  Transfers Overall transfer level: Independent Equipment used: None             General transfer comment: safe technique performed with no AD  Ambulation/Gait Ambulation/Gait assistance: Supervision Ambulation Distance (Feet): 120 Feet Assistive device: None Gait Pattern/deviations: Step-through pattern     General Gait Details: ambualted in hallway with safe technique. Pt occasionally reaches for rail for balance. All mobility performed on 4L of O2 with O2 sats at 93% pre/post exertion.  Stairs            Wheelchair Mobility    Modified Rankin (Stroke Patients Only)       Balance Overall balance assessment: History of Falls                                           Pertinent Vitals/Pain Pain  Assessment: No/denies pain    Home Living Family/patient expects to be discharged to:: Private residence Living Arrangements: Spouse/significant other Available Help at Discharge: Family Type of Home: House Home Access: Stairs to enter Entrance Stairs-Rails: None Technical brewer of Steps: 1 Home Layout: One level Home Equipment: None      Prior Function Level of Independence: Independent         Comments: Very active around house, does report she has family to assist with daily chores     Hand Dominance        Extremity/Trunk Assessment   Upper Extremity Assessment Upper Extremity Assessment: Overall WFL for tasks assessed    Lower Extremity Assessment Lower Extremity Assessment: Overall WFL for tasks assessed       Communication   Communication: No difficulties  Cognition Arousal/Alertness: Awake/alert Behavior During Therapy: WFL for tasks assessed/performed Overall Cognitive Status: Within Functional Limits for tasks assessed                      General Comments      Exercises Other Exercises Other Exercises: Pt received on BSC. Pt is indep with all hygiene and able to navigate obstacles for cleaning. Safe technique performed   Assessment/Plan    PT Assessment  Patent does not need any further PT services  PT Problem List         PT Treatment Interventions      PT Goals (Current goals can be found in the Care Plan section)  Acute Rehab PT Goals Patient Stated Goal: to go home today PT Goal Formulation: All assessment and education complete, DC therapy Time For Goal Achievement: 06-20-16 Potential to Achieve Goals: Good    Frequency     Barriers to discharge        Co-evaluation               End of Session Equipment Utilized During Treatment: Gait belt Activity Tolerance: Patient tolerated treatment well Patient left: in chair;with chair alarm set Nurse Communication: Mobility status PT Visit Diagnosis: History  of falling (Z91.81)         Time: 9528-4132 PT Time Calculation (min) (ACUTE ONLY): 17 min   Charges:   PT Evaluation $PT Eval Low Complexity: 1 Procedure PT Treatments $Therapeutic Activity: 8-22 mins   PT G Codes:         Remington Highbaugh 06/20/2016, 10:21 AM  Greggory Stallion, PT, DPT [phone]

## 2016-05-26 NOTE — Progress Notes (Signed)
Patient was discharged home with oxygen. Reviewed meds, scripts, and last dose given. IV removed with cath intact. Waiting for ride at this time.

## 2016-05-26 NOTE — Discharge Summary (Signed)
[facility] [place] NAME: Kristin [name]    MR#:  [id number]  DATE OF BIRTH:  [birth year]  DATE OF ADMISSION:  05/23/2016   ADMITTING PHYSICIAN: Wilhelmina Mcardle, MD  DATE OF DISCHARGE: 05/26/16  PRIMARY CARE PHYSICIAN: Rubye Beach, MD   ADMISSION DIAGNOSIS:   COPD exacerbation ([place]) [J44.1] Contusion of left chest wall, initial encounter [S20.212A]  DISCHARGE DIAGNOSIS:   Active Problems:   Acute respiratory failure ([place])   Malnutrition of moderate degree   SECONDARY DIAGNOSIS:   Past Medical History:  Diagnosis Date  . Asthma   . Cancer [facility]) 2010   Left ear cancer  . Diabetes mellitus without complication ([place])   . Hypertension     HOSPITAL COURSE:   71 year old female with past medical history significant for hypertension, diabetes, CK D stage III, COPD on 4 L home oxygen, CAD and peripheral vascular disease comes to the hospital secondary to sudden onset of shortness of breath.  #1 acute on chronic hypoxic respiratory failure-secondary to COPD exacerbation and bibasilar pneumonia. -CT of the chest negative for pulmonary embolism. Patient required BiPAP initially on admission. -Currently back on her home oxygen of 4 L. -on Levaquin today. Also taper steroids and nebulizer treatments. -Incentive spirometry. -Also has left-sided chest pain from fall and contusion. No rib fracture noted. Ketorolac prn by mouth -Appreciate pulmonary consult - continue home lasix. Echo with diastolic dysfunction. EF 60%  #2 diabetes mellitus- restart janumet at discharge  #3 chronic nausea-secondary to gastroparesis. Following with a GI doctor as outpatient. -Added Reglan prior to meals. Also continue Phenergan PRN  #4 Hypothyroidism-continue Synthroid  #5 hyperlipidemia-continue home medications   Physical therapy consulted. Patient walked very well. Does not need home health physical therapy but will discharge with  home  health nursing   DISCHARGE CONDITIONS:   Guarded  CONSULTS OBTAINED:   None  DRUG ALLERGIES:   Allergies  Allergen Reactions  . Codeine Nausea Only  . Morphine And Related Nausea Only   DISCHARGE MEDICATIONS:   Allergies as of 05/26/2016      Reactions   Codeine Nausea Only   Morphine And Related Nausea Only      Medication List    TAKE these medications   aspirin 81 MG EC tablet Take 1 tablet (81 mg total) by mouth daily. What changed:  how much to take   atorvastatin 40 MG tablet Commonly known as:  LIPITOR Take 1 tablet by mouth daily at 6 PM.   clonazePAM 1 MG tablet Commonly known as:  KLONOPIN Take 1 tablet by mouth daily.   CRANBERRY EXTRACT PO Take 4,200 mg by mouth daily.   fenofibrate 160 MG tablet Take 1 tablet by mouth daily.   ferrous sulfate 325 (65 FE) MG tablet Take 325 mg by mouth daily.   Fish Oil 1000 MG Caps Take 1 capsule by mouth daily.   Fluticasone-Salmeterol 250-50 MCG/DOSE Aepb Commonly known as:  ADVAIR DISKUS Inhale 1 puff into the lungs 2 (two) times daily.   furosemide 40 MG tablet Commonly known as:  LASIX Take 40 mg by mouth daily.   ipratropium-albuterol 0.5-2.5 (3) MG/3ML Soln Commonly known as:  DUONEB Take 3 mLs by nebulization every 4 (four) hours as needed.   JANUMET XR [phone] MG Tb24 Generic drug:  SitaGLIPtin-MetFORMIN HCl Take 1 tablet by mouth daily.   ketorolac 10 MG tablet Commonly known as:  TORADOL Take 1 tablet (10 mg total) by mouth every  6 (six) hours as needed.   levofloxacin 500 MG tablet Commonly known as:  LEVAQUIN Take 1 tablet (500 mg total) by mouth daily. X 5 more days   levothyroxine 112 MCG tablet Commonly known as:  SYNTHROID, LEVOTHROID Take 1 tablet by mouth daily.   metoCLOPramide 5 MG tablet Commonly known as:  REGLAN Take 1 tablet (5 mg total) by mouth 4 (four) times daily -  before meals and at bedtime.   pantoprazole 40 MG tablet Commonly known as:  PROTONIX Take  1 tablet by mouth 2 (two) times daily with breakfast and lunch.   Potassium 99 MG Tabs Take 198 mg by mouth daily.   pramipexole 0.25 MG tablet Commonly known as:  MIRAPEX Take 0.25 mg by mouth at bedtime.   predniSONE 10 MG (21) Tbpk tablet Commonly known as:  STERAPRED UNI-PAK 21 TAB 6 tabs PO x 1 day 5 tabs PO x 1 day 4 tabs PO x 1 day 3 tabs PO x 1 day 2 tabs PO x 1 day 1 tab PO x 1 day and stop   promethazine 25 MG tablet Commonly known as:  PHENERGAN Take 25 mg by mouth every 6 (six) hours as needed.   SPIRIVA HANDIHALER 18 MCG inhalation capsule Generic drug:  tiotropium Place 18 mcg into inhaler and inhale daily.   traZODone 100 MG tablet Commonly known as:  DESYREL Take 100 mg by mouth at bedtime.   VENTOLIN HFA 108 (90 Base) MCG/ACT inhaler Generic drug:  albuterol Inhale 2 puffs into the lungs every 6 (six) hours as needed.   vitamin B-12 1000 MCG tablet Commonly known as:  CYANOCOBALAMIN Take 1,000 mcg by mouth daily.            Durable Medical Equipment        Start     Ordered   05/26/16 0945  For home use only DME Nebulizer machine  Once    Question:  Patient needs a nebulizer to treat with the following condition  Answer:  COPD (chronic obstructive pulmonary disease) ([place])   05/26/16 0945       DISCHARGE INSTRUCTIONS:   1. PCP follow-up in 1-2 weeks 2. Pulmonary follow-up in 2 weeks  DIET:   Cardiac diet  ACTIVITY:   Activity as tolerated  OXYGEN:   Home Oxygen: Yes.    Oxygen Delivery: 4 liters/min via Patient connected to nasal cannula oxygen  DISCHARGE LOCATION:   home   If you experience worsening of your admission symptoms, develop shortness of breath, life threatening emergency, suicidal or homicidal thoughts you must seek medical attention immediately by calling 911 or calling your MD immediately  if symptoms less severe.  You Must read complete instructions/literature along with all the possible adverse reactions/side  effects for all the Medicines you take and that have been prescribed to you. Take any new Medicines after you have completely understood and accpet all the possible adverse reactions/side effects.   Please note  You were cared for by a hospitalist during your hospital stay. If you have any questions about your discharge medications or the care you received while you were in the hospital after you are discharged, you can call the unit and asked to speak with the hospitalist on call if the hospitalist that took care of you is not available. Once you are discharged, your primary care physician will handle any further medical issues. Please note that NO REFILLS for any discharge medications will be authorized once you are discharged,  as it is imperative that you return to your primary care physician (or establish a relationship with a primary care physician if you do not have one) for your aftercare needs so that they can reassess your need for medications and monitor your lab values.    On the day of Discharge:  VITAL SIGNS:   Blood pressure (!) 151/54, pulse 75, temperature 97.6 F (36.4 C), temperature source Oral, resp. rate 17, height 5\' 6"  (1.676 m), weight 56.2 kg (124 lb), SpO2 95 %.  PHYSICAL EXAMINATION:   GENERAL:  71 y.o.-year-old patient lying in the bed with no acute distress.  EYES: Pupils equal, round, reactive to light and accommodation. No scleral icterus. Extraocular muscles intact.  HEENT: Head atraumatic, normocephalic. Oropharynx and nasopharynx clear.  NECK:  Supple, no jugular venous distention. No thyroid enlargement, no tenderness.  LUNGS: scant breath sounds bilaterally, no rales,rhonchi or crepitation. No use of accessory muscles of respiration.  CARDIOVASCULAR: S1, S2 normal. No murmurs, rubs, or gallops.  ABDOMEN: Soft, nontender, nondistended. Bowel sounds present. No organomegaly or mass.  EXTREMITIES: No pedal edema, cyanosis, or clubbing.  NEUROLOGIC: Cranial  nerves II through XII are intact. Muscle strength 5/5 in all extremities. Sensation intact. Gait not checked.  PSYCHIATRIC: The patient is alert and oriented x 3.  SKIN: No obvious rash, lesion, or ulcer.   DATA REVIEW:   CBC  Recent Labs Lab 05/24/16 0027  WBC 8.5  HGB 11.1*  HCT 32.5*  PLT 219    Chemistries   Recent Labs Lab 05/26/16 0331  NA 138  K 3.8  CL 104  CO2 28  GLUCOSE 124*  BUN 31*  CREATININE 1.03*  CALCIUM 9.4     Microbiology Results  Results for orders placed or performed during the hospital encounter of 05/23/16  MRSA PCR Screening     Status: None   Collection Time: 05/23/16  9:39 AM  Result Value Ref Range Status   MRSA by PCR NEGATIVE NEGATIVE Final    Comment:        The GeneXpert MRSA Assay (FDA approved for NASAL specimens only), is one component of a comprehensive MRSA colonization surveillance program. It is not intended to diagnose MRSA infection nor to guide or monitor treatment for MRSA infections.     RADIOLOGY:  No results found.   Management plans discussed with the patient, family and they are in agreement.  CODE STATUS:  Code Status History    Date Active Date Inactive Code Status Order ID Comments User Context   10/23/2015 12:50 AM 10/23/2015  3:04 PM Full Code [id number]  Harvie Bridge, DO ED      TOTAL TIME TAKING CARE OF THIS PATIENT: 38 minutes.    Gladstone Lighter M.D on 05/26/2016 at 9:42 AM  Between 7am to 6pm - Pager - [phone]  After 6pm go to [email or web address] - Proofreader  [facility]  Office  [phone]  CC: Primary care physician; Rubye Beach, MD   Note: This dictation was prepared with Dragon dictation along with smaller phrase technology. Any transcriptional errors that result from this process are unintentional.

## 2016-05-26 NOTE — Care Management Note (Addendum)
Case Management Note  Patient Details  Name: Kristin [name] MRN: [id number] Date of Birth: 1945/08/24  Subjective/Objective:   Met with patient to discuss discharge planning. She is open to home health nursing, does not want PT. Prefers [facility]. Referral called with Corene Cornea with [facility]. Patient lives at home with her spouse. He works. She has a niece that comes in and helps her with cooking, cleaning. She uses a walker as needed. PCP is Rubye Beach.  Plan is home today.  Chronic o2 through [place]. Reminded patient to have her husband bring a tank for discharge.              Action/Plan: [facility] for nursing.   Expected Discharge Date:                  Expected Discharge Plan:  [place]  In-House Referral:     Discharge planning Services  CM Consult  Post Acute Care Choice:  Home Health Choice offered to:  Patient  DME Arranged:    DME Agency:     HH Arranged:  RN [place] Agency:  [place]  Status of Service:  Completed, signed off  If discussed at [place] of Stay Meetings, dates discussed:    Additional Comments:  Jolly Mango, RN 05/26/2016, 9:34 AM

## 2016-05-27 LAB — GLUCOSE, CAPILLARY: Glucose-Capillary: 108 mg/dL — ABNORMAL HIGH (ref 65–99)

## 2016-09-02 ENCOUNTER — Other Ambulatory Visit: Payer: Self-pay | Admitting: Gastroenterology

## 2016-09-02 DIAGNOSIS — R1310 Dysphagia, unspecified: Secondary | ICD-10-CM

## 2016-09-04 ENCOUNTER — Ambulatory Visit
Admission: RE | Admit: 2016-09-04 | Discharge: 2016-09-04 | Disposition: A | Discharge status: Home / Self Care | Payer: Medicare Other | Source: Ambulatory Visit | Attending: Gastroenterology | Admitting: Gastroenterology

## 2016-09-04 DIAGNOSIS — R1310 Dysphagia, unspecified: Secondary | ICD-10-CM | POA: Diagnosis present

## 2016-09-04 DIAGNOSIS — K224 Dyskinesia of esophagus: Secondary | ICD-10-CM | POA: Insufficient documentation

## 2016-09-04 DIAGNOSIS — K219 Gastro-esophageal reflux disease without esophagitis: Secondary | ICD-10-CM | POA: Diagnosis not present

## 2016-09-09 ENCOUNTER — Other Ambulatory Visit: Payer: Self-pay | Admitting: Gastroenterology

## 2016-09-09 DIAGNOSIS — K7460 Unspecified cirrhosis of liver: Secondary | ICD-10-CM

## 2016-09-15 ENCOUNTER — Ambulatory Visit
Admission: RE | Admit: 2016-09-15 | Discharge: 2016-09-15 | Disposition: A | Discharge status: Home / Self Care | Payer: Medicare Other | Source: Ambulatory Visit | Attending: Gastroenterology | Admitting: Gastroenterology

## 2016-09-22 ENCOUNTER — Ambulatory Visit
Admission: RE | Admit: 2016-09-22 | Discharge: 2016-09-22 | Disposition: A | Discharge status: Home / Self Care | Payer: Medicare Other | Source: Ambulatory Visit | Attending: Gastroenterology | Admitting: Gastroenterology

## 2016-09-22 ENCOUNTER — Ambulatory Visit: Payer: Medicare Other

## 2016-09-22 DIAGNOSIS — K7460 Unspecified cirrhosis of liver: Secondary | ICD-10-CM | POA: Insufficient documentation

## 2016-09-22 DIAGNOSIS — Z9049 Acquired absence of other specified parts of digestive tract: Secondary | ICD-10-CM | POA: Diagnosis not present

## 2016-09-25 ENCOUNTER — Ambulatory Visit: Payer: PRIVATE HEALTH INSURANCE

## 2016-09-29 ENCOUNTER — Other Ambulatory Visit (INDEPENDENT_AMBULATORY_CARE_PROVIDER_SITE_OTHER): Payer: Self-pay | Admitting: Vascular Surgery

## 2016-09-29 DIAGNOSIS — I6523 Occlusion and stenosis of bilateral carotid arteries: Secondary | ICD-10-CM

## 2016-10-01 ENCOUNTER — Ambulatory Visit (INDEPENDENT_AMBULATORY_CARE_PROVIDER_SITE_OTHER): Payer: Self-pay | Admitting: Vascular Surgery

## 2016-10-01 ENCOUNTER — Ambulatory Visit (INDEPENDENT_AMBULATORY_CARE_PROVIDER_SITE_OTHER): Payer: Medicare Other

## 2016-10-01 DIAGNOSIS — I6523 Occlusion and stenosis of bilateral carotid arteries: Secondary | ICD-10-CM | POA: Diagnosis not present

## 2016-10-06 ENCOUNTER — Encounter (INDEPENDENT_AMBULATORY_CARE_PROVIDER_SITE_OTHER): Payer: Self-pay | Admitting: Vascular Surgery

## 2016-11-30 ENCOUNTER — Observation Stay
Admission: EM | Admit: 2016-11-30 | Discharge: 2016-12-01 | Disposition: A | Discharge status: Home / Self Care | Payer: Medicare Other | Attending: Internal Medicine | Admitting: Internal Medicine

## 2016-11-30 ENCOUNTER — Encounter: Payer: Self-pay | Admitting: *Deleted

## 2016-11-30 ENCOUNTER — Emergency Department: Payer: Medicare Other

## 2016-11-30 ENCOUNTER — Observation Stay: Payer: Medicare Other

## 2016-11-30 DIAGNOSIS — Z79899 Other long term (current) drug therapy: Secondary | ICD-10-CM | POA: Diagnosis not present

## 2016-11-30 DIAGNOSIS — I10 Essential (primary) hypertension: Secondary | ICD-10-CM | POA: Insufficient documentation

## 2016-11-30 DIAGNOSIS — Z7982 Long term (current) use of aspirin: Secondary | ICD-10-CM | POA: Diagnosis not present

## 2016-11-30 DIAGNOSIS — Z885 Allergy status to narcotic agent status: Secondary | ICD-10-CM | POA: Insufficient documentation

## 2016-11-30 DIAGNOSIS — E119 Type 2 diabetes mellitus without complications: Secondary | ICD-10-CM | POA: Diagnosis not present

## 2016-11-30 DIAGNOSIS — J9611 Chronic respiratory failure with hypoxia: Secondary | ICD-10-CM | POA: Insufficient documentation

## 2016-11-30 DIAGNOSIS — R531 Weakness: Secondary | ICD-10-CM | POA: Diagnosis present

## 2016-11-30 DIAGNOSIS — J449 Chronic obstructive pulmonary disease, unspecified: Secondary | ICD-10-CM | POA: Diagnosis not present

## 2016-11-30 DIAGNOSIS — G9340 Encephalopathy, unspecified: Secondary | ICD-10-CM

## 2016-11-30 DIAGNOSIS — R4182 Altered mental status, unspecified: Secondary | ICD-10-CM | POA: Insufficient documentation

## 2016-11-30 DIAGNOSIS — Z888 Allergy status to other drugs, medicaments and biological substances status: Secondary | ICD-10-CM | POA: Insufficient documentation

## 2016-11-30 DIAGNOSIS — G92 Toxic encephalopathy: Secondary | ICD-10-CM | POA: Diagnosis not present

## 2016-11-30 DIAGNOSIS — N390 Urinary tract infection, site not specified: Secondary | ICD-10-CM | POA: Diagnosis not present

## 2016-11-30 DIAGNOSIS — Z859 Personal history of malignant neoplasm, unspecified: Secondary | ICD-10-CM | POA: Insufficient documentation

## 2016-11-30 DIAGNOSIS — E860 Dehydration: Secondary | ICD-10-CM | POA: Insufficient documentation

## 2016-11-30 LAB — GLUCOSE, CAPILLARY: GLUCOSE-CAPILLARY: 85 mg/dL (ref 65–99)

## 2016-11-30 LAB — COMPREHENSIVE METABOLIC PANEL
ALK PHOS: 45 U/L (ref 38–126)
ALT: 15 U/L (ref 14–54)
AST: 19 U/L (ref 15–41)
Albumin: 4.2 g/dL (ref 3.5–5.0)
Anion gap: 9 (ref 5–15)
BILIRUBIN TOTAL: 0.8 mg/dL (ref 0.3–1.2)
BUN: 12 mg/dL (ref 6–20)
CALCIUM: 9.3 mg/dL (ref 8.9–10.3)
CO2: 25 mmol/L (ref 22–32)
CREATININE: 1.03 mg/dL — AB (ref 0.44–1.00)
Chloride: 107 mmol/L (ref 101–111)
GFR calc Af Amer: >60 mL/min (ref >60)
GFR calc non Af Amer: 54 mL/min — ABNORMAL LOW (ref >60)
GLUCOSE: 154 mg/dL — AB (ref 65–99)
Potassium: 3.4 mmol/L — ABNORMAL LOW (ref 3.5–5.1)
SODIUM: 141 mmol/L (ref 135–145)
TOTAL PROTEIN: 6.9 g/dL (ref 6.5–8.1)

## 2016-11-30 LAB — CBC WITH DIFFERENTIAL/PLATELET
BASOS PCT: 0 %
Basophils Absolute: 0 10*3/uL (ref 0–0.1)
EOS ABS: 0.2 10*3/uL (ref 0–0.7)
EOS PCT: 2 %
HEMATOCRIT: 34.2 % — AB (ref 35.0–47.0)
Hemoglobin: 11.8 g/dL — ABNORMAL LOW (ref 12.0–16.0)
Lymphocytes Relative: 19 %
Lymphs Abs: 1.6 10*3/uL (ref 1.0–3.6)
MCH: 32.4 pg (ref 26.0–34.0)
MCHC: 34.4 g/dL (ref 32.0–36.0)
MCV: 94.3 fL (ref 80.0–100.0)
MONO ABS: 0.5 10*3/uL (ref 0.2–0.9)
Monocytes Relative: 6 %
Neutro Abs: 6.5 10*3/uL (ref 1.4–6.5)
Neutrophils Relative %: 73 %
PLATELETS: 179 10*3/uL (ref 150–440)
RBC: 3.62 MIL/uL — ABNORMAL LOW (ref 3.80–5.20)
RDW: 13.5 % (ref 11.5–14.5)
WBC: 8.9 10*3/uL (ref 3.6–11.0)

## 2016-11-30 LAB — URINE DRUG SCREEN, QUALITATIVE (ARMC ONLY)
Amphetamines, Ur Screen: NOT DETECTED
BARBITURATES, UR SCREEN: NOT DETECTED
BENZODIAZEPINE, UR SCRN: NOT DETECTED
CANNABINOID 50 NG, UR ~~LOC~~: NOT DETECTED
COCAINE METABOLITE, UR ~~LOC~~: NOT DETECTED
MDMA (ECSTASY) UR SCREEN: POSITIVE — AB
Methadone Scn, Ur: NOT DETECTED
Opiate, Ur Screen: NOT DETECTED
Phencyclidine (PCP) Ur S: NOT DETECTED
Tricyclic, Ur Screen: NOT DETECTED

## 2016-11-30 LAB — URINALYSIS, COMPLETE (UACMP) WITH MICROSCOPIC
Bilirubin Urine: NEGATIVE
Glucose, UA: NEGATIVE mg/dL
Hgb urine dipstick: NEGATIVE
Ketones, ur: NEGATIVE mg/dL
Nitrite: POSITIVE — AB
PH: 6 (ref 5.0–8.0)
Protein, ur: 100 mg/dL — AB
Specific Gravity, Urine: 1.014 (ref 1.005–1.030)
Squamous Epithelial / LPF: NONE SEEN

## 2016-11-30 LAB — TSH: TSH: 0.916 u[IU]/mL (ref 0.350–4.500)

## 2016-11-30 LAB — TROPONIN I

## 2016-11-30 LAB — LACTIC ACID, PLASMA: Lactic Acid, Venous: 1.4 mmol/L (ref 0.5–1.9)

## 2016-11-30 MED ORDER — CEFTRIAXONE SODIUM IN DEXTROSE 20 MG/ML IV SOLN: 1.0000 g | INTRAVENOUS | Status: DC

## 2016-11-30 MED ORDER — VITAMIN B-12 1000 MCG PO TABS
1000.0000 ug | ORAL_TABLET | Freq: Every day | ORAL | Status: DC
  Administered 2016-12-01: 1000 ug via ORAL
  Filled 2016-11-30: qty 1

## 2016-11-30 MED ORDER — FERROUS SULFATE 325 (65 FE) MG PO TABS
325.0000 mg | ORAL_TABLET | Freq: Every day | ORAL | Status: DC
  Administered 2016-12-01: 10:00:00 325 mg via ORAL
  Filled 2016-11-30: qty 1

## 2016-11-30 MED ORDER — ACETAMINOPHEN 325 MG PO TABS: 650.0000 mg | ORAL_TABLET | Freq: Four times a day (QID) | ORAL | Status: DC | PRN

## 2016-11-30 MED ORDER — PRAMIPEXOLE DIHYDROCHLORIDE 0.25 MG PO TABS
0.2500 mg | ORAL_TABLET | Freq: Every day | ORAL | Status: DC
  Administered 2016-11-30: 21:00:00 0.25 mg via ORAL
  Filled 2016-11-30: qty 1

## 2016-11-30 MED ORDER — TRAZODONE HCL 50 MG PO TABS
100.0000 mg | ORAL_TABLET | Freq: Every day | ORAL | Status: DC
  Administered 2016-11-30: 21:00:00 100 mg via ORAL
  Filled 2016-11-30: qty 2

## 2016-11-30 MED ORDER — MOMETASONE FURO-FORMOTEROL FUM 200-5 MCG/ACT IN AERO
2.0000 | INHALATION_SPRAY | Freq: Two times a day (BID) | RESPIRATORY_TRACT | Status: DC
  Administered 2016-11-30 – 2016-12-01 (×2): 2 via RESPIRATORY_TRACT
  Filled 2016-11-30: qty 8.8

## 2016-11-30 MED ORDER — IPRATROPIUM-ALBUTEROL 0.5-2.5 (3) MG/3ML IN SOLN: 3.0000 mL | Freq: Four times a day (QID) | RESPIRATORY_TRACT | Status: DC

## 2016-11-30 MED ORDER — ONDANSETRON HCL 4 MG/2ML IJ SOLN: 4.0000 mg | Freq: Four times a day (QID) | INTRAMUSCULAR | Status: DC | PRN

## 2016-11-30 MED ORDER — ALBUTEROL SULFATE (2.5 MG/3ML) 0.083% IN NEBU: 3.0000 mL | INHALATION_SOLUTION | Freq: Four times a day (QID) | RESPIRATORY_TRACT | Status: DC | PRN

## 2016-11-30 MED ORDER — FENOFIBRATE 160 MG PO TABS
160.0000 mg | ORAL_TABLET | Freq: Every day | ORAL | Status: DC
  Administered 2016-11-30 – 2016-12-01 (×2): 160 mg via ORAL
  Filled 2016-11-30 (×2): qty 1

## 2016-11-30 MED ORDER — CLONAZEPAM 0.5 MG PO TABS
0.5000 mg | ORAL_TABLET | Freq: Three times a day (TID) | ORAL | Status: DC | PRN
  Administered 2016-12-01: 02:00:00 0.5 mg via ORAL
  Filled 2016-11-30: qty 1

## 2016-11-30 MED ORDER — SITAGLIP PHOS-METFORMIN HCL ER 100-1000 MG PO TB24: 1.0000 | ORAL_TABLET | Freq: Every day | ORAL | Status: DC

## 2016-11-30 MED ORDER — METFORMIN HCL ER 500 MG PO TB24
1000.0000 mg | ORAL_TABLET | Freq: Every day | ORAL | Status: DC
  Administered 2016-12-01: 08:00:00 1000 mg via ORAL
  Filled 2016-11-30: qty 2

## 2016-11-30 MED ORDER — INFLUENZA VAC SPLIT HIGH-DOSE 0.5 ML IM SUSY
0.5000 mL | PREFILLED_SYRINGE | INTRAMUSCULAR | Status: AC
  Administered 2016-12-01: 12:00:00 0.5 mL via INTRAMUSCULAR
  Filled 2016-11-30 (×2): qty 0.5

## 2016-11-30 MED ORDER — LEVOTHYROXINE SODIUM 112 MCG PO TABS
112.0000 ug | ORAL_TABLET | Freq: Every day | ORAL | Status: DC
  Administered 2016-12-01: 08:00:00 112 ug via ORAL
  Filled 2016-11-30: qty 1

## 2016-11-30 MED ORDER — CEFTRIAXONE SODIUM IN DEXTROSE 20 MG/ML IV SOLN: 1.0000 g | Freq: Once | INTRAVENOUS | Status: DC

## 2016-11-30 MED ORDER — ACETAMINOPHEN 650 MG RE SUPP: 650.0000 mg | Freq: Four times a day (QID) | RECTAL | Status: DC | PRN

## 2016-11-30 MED ORDER — FUROSEMIDE 40 MG PO TABS
40.0000 mg | ORAL_TABLET | Freq: Every day | ORAL | Status: DC
  Administered 2016-12-01: 40 mg via ORAL
  Filled 2016-11-30: qty 1

## 2016-11-30 MED ORDER — DEXTROSE 5 % IV SOLN
1.0000 g | INTRAVENOUS | Status: DC
  Administered 2016-11-30: 21:00:00 1 g via INTRAVENOUS
  Filled 2016-11-30 (×2): qty 10

## 2016-11-30 MED ORDER — POTASSIUM CHLORIDE IN NACL 20-0.9 MEQ/L-% IV SOLN
INTRAVENOUS | Status: DC
  Administered 2016-11-30: 21:00:00 via INTRAVENOUS
  Filled 2016-11-30 (×3): qty 1000

## 2016-11-30 MED ORDER — INSULIN ASPART 100 UNIT/ML ~~LOC~~ SOLN
0.0000 [IU] | Freq: Three times a day (TID) | SUBCUTANEOUS | Status: DC
Start: 2016-12-01 — End: 2016-12-01
  Administered 2016-12-01: 08:00:00 1 [IU] via SUBCUTANEOUS
  Filled 2016-11-30: qty 1

## 2016-11-30 MED ORDER — ONDANSETRON HCL 4 MG PO TABS: 4.0000 mg | ORAL_TABLET | Freq: Four times a day (QID) | ORAL | Status: DC | PRN

## 2016-11-30 MED ORDER — PANTOPRAZOLE SODIUM 40 MG PO TBEC
40.0000 mg | DELAYED_RELEASE_TABLET | Freq: Two times a day (BID) | ORAL | Status: DC
  Administered 2016-12-01: 08:00:00 40 mg via ORAL
  Filled 2016-11-30: qty 1

## 2016-11-30 MED ORDER — DEXTROSE 5 % IV SOLN
1.0000 g | INTRAVENOUS | Status: DC
  Filled 2016-11-30: qty 10

## 2016-11-30 MED ORDER — ATORVASTATIN CALCIUM 20 MG PO TABS: 40.0000 mg | ORAL_TABLET | Freq: Every day | ORAL | Status: DC

## 2016-11-30 MED ORDER — HEPARIN SODIUM (PORCINE) 5000 UNIT/ML IJ SOLN
5000.0000 [IU] | Freq: Three times a day (TID) | INTRAMUSCULAR | Status: DC
  Administered 2016-11-30 – 2016-12-01 (×2): 5000 [IU] via SUBCUTANEOUS
  Filled 2016-11-30 (×2): qty 1

## 2016-11-30 MED ORDER — ASPIRIN EC 81 MG PO TBEC
162.0000 mg | DELAYED_RELEASE_TABLET | Freq: Every day | ORAL | Status: DC
  Administered 2016-12-01: 162 mg via ORAL
  Filled 2016-11-30: qty 2

## 2016-11-30 MED ORDER — TIOTROPIUM BROMIDE MONOHYDRATE 18 MCG IN CAPS
18.0000 ug | ORAL_CAPSULE | Freq: Every day | RESPIRATORY_TRACT | Status: DC
  Administered 2016-12-01: 10:00:00 18 ug via RESPIRATORY_TRACT
  Filled 2016-11-30: qty 5

## 2016-11-30 MED ORDER — LINAGLIPTIN 5 MG PO TABS
5.0000 mg | ORAL_TABLET | Freq: Every day | ORAL | Status: DC
  Administered 2016-12-01: 08:00:00 5 mg via ORAL
  Filled 2016-11-30: qty 1

## 2016-11-30 MED ORDER — BISACODYL 10 MG RE SUPP: 10.0000 mg | Freq: Every day | RECTAL | Status: DC | PRN

## 2016-11-30 MED ORDER — DOCUSATE SODIUM 100 MG PO CAPS
100.0000 mg | ORAL_CAPSULE | Freq: Two times a day (BID) | ORAL | Status: DC
  Administered 2016-11-30 – 2016-12-01 (×2): 100 mg via ORAL
  Filled 2016-11-30 (×2): qty 1

## 2016-11-30 MED ORDER — METOCLOPRAMIDE HCL 5 MG PO TABS
5.0000 mg | ORAL_TABLET | Freq: Three times a day (TID) | ORAL | Status: DC
  Administered 2016-11-30 – 2016-12-01 (×2): 5 mg via ORAL
  Filled 2016-11-30 (×2): qty 1

## 2016-11-30 NOTE — ED Triage Notes (Signed)
Per EMS report, Patient c/o weakness beginning this morning with some confusion according to the family. Patient is on Cipro for a current  UTI. Patient is alert and oriented x4 upon arrival.

## 2016-11-30 NOTE — H&P (Signed)
History and Physical    Kristin [name] TKP:[id number] DOB: December 02, 1945 DOA: 11/30/2016  Referring physician: Dr. Clearnce Hasten PCP: Rubye Beach, MD  Specialists: Dr. Raul Del  Chief Complaint: weakness and confusion  HPI: Kristin [name] is a 71 y.o. female has a past medical history significant for COPD on O2, HTN, DM, and recurrent UTI's who presents to ER with 3 days hx of worsening confusion and weakness. On po Cipro x 3 days for UTI. In ER, UA grossly abnormal. Pt too weak to ambulate. Husband unable to care for patient at home. She is now admitted. No N/V/D. Denies CP or worsening SOB. Does c/o anorexia and low grade fever at home  Review of Systems: The patient denies  weight loss,, vision loss, decreased hearing, hoarseness, chest pain, syncope,  peripheral edema, balance deficits, hemoptysis, abdominal pain, melena, hematochezia, severe indigestion/heartburn, hematuria, incontinence, genital sores, muscle weakness, suspicious skin lesions, transient blindness,  depression, unusual weight change, abnormal bleeding, enlarged lymph nodes, angioedema, and breast masses.   Past Medical History:  Diagnosis Date  . Asthma   . Cancer [facility]) 2010   Left ear cancer  . Diabetes mellitus without complication ([place])   . Hypertension    History reviewed. No pertinent surgical history. Social History:  reports that she has never smoked. She has never used smokeless tobacco. She reports that she does not drink alcohol or use drugs.  Allergies  Allergen Reactions  . Codeine Nausea Only  . Morphine And Related Nausea Only    History reviewed. No pertinent family history.  Prior to Admission medications   Medication Sig Start Date End Date Taking? Authorizing Provider  aspirin EC 81 MG EC tablet Take 1 tablet (81 mg total) by mouth daily. Patient taking differently: Take 162 mg by mouth daily.  10/24/15   Loletha Grayer, MD  atorvastatin (LIPITOR) 40 MG tablet Take 1 tablet by mouth  daily at 6 PM. 10/12/15   [provider]  clonazePAM (KLONOPIN) 1 MG tablet Take 1 tablet by mouth daily. 10/01/15   [provider]  CRANBERRY EXTRACT PO Take 4,200 mg by mouth daily.    [provider]  fenofibrate 160 MG tablet Take 1 tablet by mouth daily. 10/06/15   [provider]  ferrous sulfate 325 (65 FE) MG tablet Take 325 mg by mouth daily.    [provider]  Fluticasone-Salmeterol (ADVAIR DISKUS) 250-50 MCG/DOSE AEPB Inhale 1 puff into the lungs 2 (two) times daily. 05/26/16   Gladstone Lighter, MD  furosemide (LASIX) 40 MG tablet Take 40 mg by mouth daily. 08/03/15   [provider]  ipratropium-albuterol (DUONEB) 0.5-2.5 (3) MG/3ML SOLN Take 3 mLs by nebulization every 4 (four) hours as needed. 05/26/16   Gladstone Lighter, MD  JANUMET XR [phone] MG TB24 Take 1 tablet by mouth daily. 10/06/15   [provider]  ketorolac (TORADOL) 10 MG tablet Take 1 tablet (10 mg total) by mouth every 6 (six) hours as needed. 05/26/16   Gladstone Lighter, MD  levofloxacin (LEVAQUIN) 500 MG tablet Take 1 tablet (500 mg total) by mouth daily. X 5 more days 05/26/16   Gladstone Lighter, MD  levothyroxine (SYNTHROID, LEVOTHROID) 112 MCG tablet Take 1 tablet by mouth daily. 09/15/15   [provider]  metoCLOPramide (REGLAN) 5 MG tablet Take 1 tablet (5 mg total) by mouth 4 (four) times daily -  before meals and at bedtime. 05/26/16   Gladstone Lighter, MD  Omega-3 Fatty Acids (FISH OIL) 1000 MG  CAPS Take 1 capsule by mouth daily.    [provider]  pantoprazole (PROTONIX) 40 MG tablet Take 1 tablet by mouth 2 (two) times daily with breakfast and lunch. 10/20/15   [provider]  Potassium 99 MG TABS Take 198 mg by mouth daily.    [provider]  pramipexole (MIRAPEX) 0.25 MG tablet Take 0.25 mg by mouth at bedtime. 05/04/16   [provider]  predniSONE (STERAPRED UNI-PAK 21 TAB) 10 MG (21) TBPK tablet  6 tabs PO x 1 day 5 tabs PO x 1 day 4 tabs PO x 1 day 3 tabs PO x 1 day 2 tabs PO x 1 day 1 tab PO x 1 day and stop 05/26/16   Gladstone Lighter, MD  promethazine (PHENERGAN) 25 MG tablet Take 25 mg by mouth every 6 (six) hours as needed.  09/14/15   [provider]  SPIRIVA HANDIHALER 18 MCG inhalation capsule Place 18 mcg into inhaler and inhale daily. 05/13/16   [provider]  traZODone (DESYREL) 100 MG tablet Take 100 mg by mouth at bedtime.  09/07/15   [provider]  VENTOLIN HFA 108 (90 Base) MCG/ACT inhaler Inhale 2 puffs into the lungs every 6 (six) hours as needed.  10/11/15   [provider]  vitamin B-12 (CYANOCOBALAMIN) 1000 MCG tablet Take 1,000 mcg by mouth daily.    [provider]   Physical Exam: Vitals:   11/30/16 1530 11/30/16 1600 11/30/16 1630 11/30/16 1700  BP: (!) 118/56 126/73 (!) 130/56 (!) 144/65  Pulse: 81 77 75 77  Resp: (!) 22 (!) 26 (!) 23 (!) 24  Temp:      TempSrc:      SpO2: 97% 98% 98% 99%  Weight:      Height:         General:  No apparent distress, [place]/AT, WDWN  Eyes: PERRL, EOMI, no scleral icterus, conjunctiva clear  ENT: moist oropharynx without exudate, TM's benign, dentition good  Neck: supple, no lymphadenopathy. No brutis or thyromegaly  Cardiovascular: regular rate without MRG; 2+ peripheral pulses, no JVD, no peripheral edema  Respiratory: CTA biL, decreased breath sounds without wheezing, rhonchi or crackled. Respiratory effort normal  Abdomen: soft, non tender to palpation, positive bowel sounds, no guarding, no rebound  Skin: no rashes or lesions  Musculoskeletal: normal bulk and tone, no joint swelling  Psychiatric: normal mood and affect, A&OX3  Neurologic: CN 2-12 grossly intact, Motor strength 5/5 in all 4 groups with symmetric DTR's and non-focal sensory exam  Labs on Admission:  Basic Metabolic Panel:  Recent Labs Lab 11/30/16 1515  NA 141  K 3.4*  CL 107  CO2 25   GLUCOSE 154*  BUN 12  CREATININE 1.03*  CALCIUM 9.3   Liver Function Tests:  Recent Labs Lab 11/30/16 1515  AST 19  ALT 15  ALKPHOS 45  BILITOT 0.8  PROT 6.9  ALBUMIN 4.2   No results for input(s): LIPASE, AMYLASE in the last 168 hours. No results for input(s): AMMONIA in the last 168 hours. CBC:  Recent Labs Lab 11/30/16 1515  WBC 8.9  NEUTROABS 6.5  HGB 11.8*  HCT 34.2*  MCV 94.3  PLT 179   Cardiac Enzymes:  Recent Labs Lab 11/30/16 1515  TROPONINI <0.03    BNP (last 3 results) No results for input(s): BNP in the last 8760 hours.  ProBNP (last 3 results) No results for input(s): PROBNP in the last 8760 hours.  CBG: No results  for input(s): GLUCAP in the last 168 hours.  Radiological Exams on Admission: Dg Chest Port 1 View  Result Date: 11/30/2016 CLINICAL DATA:  Diabetes and hypertension. Weakness and confusion beginning today. EXAM: PORTABLE CHEST 1 VIEW COMPARISON:  05/24/2016 FINDINGS: Heart size is normal. Chronic aortic atherosclerosis. Chronic mild scarring at the lung bases. No sign of active infiltrate, mass, effusion or collapse. No evidence of heart failure. IMPRESSION: Chronic basilar pulmonary scarring. Aortic atherosclerosis. No active disease. Electronically Signed   By: Nelson Chimes M.D.   On: 11/30/2016 15:14    EKG: Independently reviewed.  Assessment/Plan Principal Problem:   Encephalopathy acute Active Problems:   UTI (urinary tract infection)   Chronic respiratory failure with hypoxia (HCC)   Weakness generalized   Will observe on floor with IV fluids and IV ABX. Cultures sent. CT of head ordered. Consult CM and PT. Repeat labs in AM.  Diet: low carb Fluids: NS@75  DVT Prophylaxis: SQ Heparin  Code Status: FULl  Family Communication: yes  Disposition Plan: home  Time spent: 50 min

## 2016-11-30 NOTE — Progress Notes (Signed)
CH responded to a PG for an [facility] education. Pt and husband were educated  on the document and hope to complete for both of them within the next few days. CH provided prayer for healing.    11/30/16 2000  Clinical Encounter Type  Visited With Patient;Patient and family together  Visit Type Initial;Spiritual support  Referral From Nurse  Consult/Referral To Chaplain  Spiritual Encounters  Spiritual Needs Literature;Prayer

## 2016-11-30 NOTE — ED Notes (Signed)
Patient placed on bedpan by [facility] ED Tech.

## 2016-11-30 NOTE — ED Provider Notes (Signed)
[facility] Emergency Department Provider Note  ____________________________________________   First MD Initiated Contact with Patient 11/30/16 1427     (approximate)  I have reviewed the triage vital signs and the nursing notes.   HISTORY  Chief Complaint Weakness   HPI Kristin [name] is a 71 y.o. female who was recently diagnosed with a urinary tract infection and is currently taking Cipro who is presenting to emergency Department with diffuse weakness and confusion. The patient is denying pain except with urination. She is on a baseline 4 L of nasal cannula oxygen which EMS is maintained in route. EMS noted the patient to be to And also gave her 5 mg of albuterol en route.   Past Medical History:  Diagnosis Date  . Asthma   . Cancer [facility]) 2010   Left ear cancer  . Diabetes mellitus without complication ([place])   . Hypertension     Patient Active Problem List   Diagnosis Date Noted  . Acute respiratory failure ([place]) 05/23/2016  . Malnutrition of moderate degree 05/23/2016  . Syncope 10/23/2015    History reviewed. No pertinent surgical history.  Prior to Admission medications   Medication Sig Start Date End Date Taking? Authorizing Provider  aspirin EC 81 MG EC tablet Take 1 tablet (81 mg total) by mouth daily. Patient taking differently: Take 162 mg by mouth daily.  10/24/15   Loletha Grayer, MD  atorvastatin (LIPITOR) 40 MG tablet Take 1 tablet by mouth daily at 6 PM. 10/12/15   [provider]  clonazePAM (KLONOPIN) 1 MG tablet Take 1 tablet by mouth daily. 10/01/15   [provider]  CRANBERRY EXTRACT PO Take 4,200 mg by mouth daily.    [provider]  fenofibrate 160 MG tablet Take 1 tablet by mouth daily. 10/06/15   [provider]  ferrous sulfate 325 (65 FE) MG tablet Take 325 mg by mouth daily.    [provider]  Fluticasone-Salmeterol (ADVAIR DISKUS) 250-50 MCG/DOSE AEPB Inhale 1 puff  into the lungs 2 (two) times daily. 05/26/16   Gladstone Lighter, MD  furosemide (LASIX) 40 MG tablet Take 40 mg by mouth daily. 08/03/15   [provider]  ipratropium-albuterol (DUONEB) 0.5-2.5 (3) MG/3ML SOLN Take 3 mLs by nebulization every 4 (four) hours as needed. 05/26/16   Gladstone Lighter, MD  JANUMET XR [phone] MG TB24 Take 1 tablet by mouth daily. 10/06/15   [provider]  ketorolac (TORADOL) 10 MG tablet Take 1 tablet (10 mg total) by mouth every 6 (six) hours as needed. 05/26/16   Gladstone Lighter, MD  levofloxacin (LEVAQUIN) 500 MG tablet Take 1 tablet (500 mg total) by mouth daily. X 5 more days 05/26/16   Gladstone Lighter, MD  levothyroxine (SYNTHROID, LEVOTHROID) 112 MCG tablet Take 1 tablet by mouth daily. 09/15/15   [provider]  metoCLOPramide (REGLAN) 5 MG tablet Take 1 tablet (5 mg total) by mouth 4 (four) times daily -  before meals and at bedtime. 05/26/16   Gladstone Lighter, MD  Omega-3 Fatty Acids (FISH OIL) 1000 MG CAPS Take 1 capsule by mouth daily.    [provider]  pantoprazole (PROTONIX) 40 MG tablet Take 1 tablet by mouth 2 (two) times daily with breakfast and lunch. 10/20/15   [provider]  Potassium 99 MG TABS Take 198 mg by mouth daily.    [provider]  pramipexole (MIRAPEX) 0.25 MG tablet Take 0.25 mg by mouth at bedtime. 05/04/16  [provider]  predniSONE (STERAPRED UNI-PAK 21 TAB) 10 MG (21) TBPK tablet 6 tabs PO x 1 day 5 tabs PO x 1 day 4 tabs PO x 1 day 3 tabs PO x 1 day 2 tabs PO x 1 day 1 tab PO x 1 day and stop 05/26/16   Gladstone Lighter, MD  promethazine (PHENERGAN) 25 MG tablet Take 25 mg by mouth every 6 (six) hours as needed.  09/14/15   [provider]  SPIRIVA HANDIHALER 18 MCG inhalation capsule Place 18 mcg into inhaler and inhale daily. 05/13/16   [provider]  traZODone (DESYREL) 100 MG tablet Take 100 mg by mouth at bedtime.  09/07/15   [provider]  VENTOLIN HFA 108 (90 Base) MCG/ACT inhaler Inhale 2 puffs into the lungs every 6 (six) hours as needed.  10/11/15   [provider]  vitamin B-12 (CYANOCOBALAMIN) 1000 MCG tablet Take 1,000 mcg by mouth daily.    [provider]    Allergies Codeine and Morphine and related  No family history on file.  Social History Social History  Substance Use Topics  . Smoking status: Never Smoker  . Smokeless tobacco: Never Used  . Alcohol use No    Review of Systems  Constitutional: No fever/chills Eyes: No visual changes. ENT: No sore throat. Cardiovascular: Denies chest pain. Respiratory: Denies shortness of breath. Gastrointestinal: No abdominal pain.  No nausea, no vomiting.  No diarrhea.  No constipation. Genitourinary: positive for dysuria Musculoskeletal: Negative for back pain. Skin: Negative for rash. Neurological: Negative for headaches, focal weakness or numbness.   ____________________________________________   PHYSICAL EXAM:  VITAL SIGNS: ED Triage Vitals  Enc Vitals Group     BP 11/30/16 1433 (!) 148/54     Pulse Rate 11/30/16 1433 86     Resp 11/30/16 1433 (!) 22     Temp 11/30/16 1433 98.4 F (36.9 C)     Temp Source 11/30/16 1433 Oral     SpO2 11/30/16 1431 96 %     Weight 11/30/16 1434 121 lb (54.9 kg)     Height 11/30/16 1434 5\' 4"  (1.626 m)     Head Circumference --      Peak Flow --      Pain Score --      Pain Loc --      Pain Edu? --      Excl. in [place]? --     Constitutional: Alert and oriented. Well appearing and in no acute distress. Eyes: Conjunctivae are normal.  Head: Atraumatic. Nose: No congestion/rhinnorhea. Mouth/Throat: Mucous membranes are moist.  Neck: No stridor.   Cardiovascular: Normal rate, regular rhythm. Grossly normal heart sounds.  Respiratory: tachypneic but withNormal respiratory effort.  No retractions. Lungs CTAB.wearing nasal cannula oxygen. Gastrointestinal: Soft and nontender. No  distention. No CVA tenderness. Musculoskeletal: No lower extremity tenderness nor edema.  No joint effusions. Neurologic:  Normal speech and language. No gross focal neurologic deficits are appreciated. Skin:  Skin is warm, dry and intact. No rash noted. Psychiatric: Mood and affect are normal. Speech and behavior are normal.  ____________________________________________   LABS (all labs ordered are listed, but only abnormal results are displayed)  Labs Reviewed  URINALYSIS, COMPLETE (UACMP) WITH MICROSCOPIC - Abnormal; Notable for the following:       Result Value   Color, Urine AMBER (*)    APPearance CLOUDY (*)    Protein, ur 100 (*)    Nitrite POSITIVE (*)  Leukocytes, UA MODERATE (*)    Bacteria, UA MANY (*)    All other components within normal limits  COMPREHENSIVE METABOLIC PANEL - Abnormal; Notable for the following:    Potassium 3.4 (*)    Glucose, Bld 154 (*)    Creatinine, Ser 1.03 (*)    GFR calc non Af Amer 54 (*)    All other components within normal limits  CBC WITH DIFFERENTIAL/PLATELET - Abnormal; Notable for the following:    RBC 3.62 (*)    Hemoglobin 11.8 (*)    HCT 34.2 (*)    All other components within normal limits  CULTURE, BLOOD (ROUTINE X 2)  CULTURE, BLOOD (ROUTINE X 2)  URINE CULTURE  LACTIC ACID, PLASMA  TROPONIN I  LACTIC ACID, PLASMA  URINE DRUG SCREEN, QUALITATIVE ([facility] ONLY)   ____________________________________________  EKG  ED ECG REPORT I, Doran Stabler, the attending physician, personally viewed and interpreted this ECG.   Date: 11/30/2016  EKG Time: 1432  Rate: 86  Rhythm: normal sinus rhythm  Axis: normal  Intervals:none  ST&T Change: no ST segment elevation or depression. Single T-wave inversion in V2.  ____________________________________________  RADIOLOGY  no acute finding on the chest x-ray ____________________________________________   PROCEDURES  Procedure(s) performed:   Procedures  Critical  Care performed:   ____________________________________________   INITIAL IMPRESSION / ASSESSMENT AND PLAN / ED COURSE  Pertinent labs & imaging results that were available during my care of the patient were reviewed by me and considered in my medical decision making (see chart for details).  ----------------------------------------- 5:11 PM on 11/30/2016 -----------------------------------------  Patient with nitrite positive UA. Failing outpatient treatment. She'll be placed on ceftriaxone. We'll admit to the hospital. Patient aware of the plan and willing to comply. Signed out to Dr. Doy Hutching.      ____________________________________________   FINAL CLINICAL IMPRESSION(S) / ED DIAGNOSES  urinary tract infection. Altered mental status. Generalized weakness.    NEW MEDICATIONS STARTED DURING THIS VISIT:  New Prescriptions   No medications on file     Note:  This document was prepared using Dragon voice recognition software and may include unintentional dictation errors.     Orbie Pyo, MD 11/30/16 [phone]

## 2016-11-30 NOTE — ED Notes (Signed)
Patient assited to the room commode with two person assist. Patient was unable to void at this time.

## 2016-12-01 DIAGNOSIS — N390 Urinary tract infection, site not specified: Secondary | ICD-10-CM | POA: Diagnosis not present

## 2016-12-01 LAB — CBC
HCT: 30.6 % — ABNORMAL LOW (ref 35.0–47.0)
HEMOGLOBIN: 10.5 g/dL — AB (ref 12.0–16.0)
MCH: 31.9 pg (ref 26.0–34.0)
MCHC: 34.2 g/dL (ref 32.0–36.0)
MCV: 93.4 fL (ref 80.0–100.0)
Platelets: 174 10*3/uL (ref 150–440)
RBC: 3.28 MIL/uL — ABNORMAL LOW (ref 3.80–5.20)
RDW: 13.4 % (ref 11.5–14.5)
WBC: 7.9 10*3/uL (ref 3.6–11.0)

## 2016-12-01 LAB — COMPREHENSIVE METABOLIC PANEL WITH GFR
ALT: 11 U/L — ABNORMAL LOW (ref 14–54)
AST: 15 U/L (ref 15–41)
Albumin: 3.7 g/dL (ref 3.5–5.0)
Alkaline Phosphatase: 38 U/L (ref 38–126)
Anion gap: 6 (ref 5–15)
BUN: 11 mg/dL (ref 6–20)
CO2: 26 mmol/L (ref 22–32)
Calcium: 8.8 mg/dL — ABNORMAL LOW (ref 8.9–10.3)
Chloride: 108 mmol/L (ref 101–111)
Creatinine, Ser: 0.99 mg/dL (ref 0.44–1.00)
GFR calc Af Amer: >60 mL/min
GFR calc non Af Amer: 56 mL/min — ABNORMAL LOW
Glucose, Bld: 111 mg/dL — ABNORMAL HIGH (ref 65–99)
Potassium: 3.4 mmol/L — ABNORMAL LOW (ref 3.5–5.1)
Sodium: 140 mmol/L (ref 135–145)
Total Bilirubin: 0.7 mg/dL (ref 0.3–1.2)
Total Protein: 6.2 g/dL — ABNORMAL LOW (ref 6.5–8.1)

## 2016-12-01 LAB — GLUCOSE, CAPILLARY
GLUCOSE-CAPILLARY: 148 mg/dL — AB (ref 65–99)
Glucose-Capillary: 101 mg/dL — ABNORMAL HIGH (ref 65–99)

## 2016-12-01 MED ORDER — IPRATROPIUM-ALBUTEROL 0.5-2.5 (3) MG/3ML IN SOLN: 3.0000 mL | RESPIRATORY_TRACT | Status: DC | PRN

## 2016-12-01 MED ORDER — CEPHALEXIN 250 MG PO CAPS: 250.0000 mg | ORAL_CAPSULE | Freq: Three times a day (TID) | ORAL | 0 refills | Status: AC

## 2016-12-01 NOTE — Care Management Note (Signed)
Case Management Note  Patient Details  Name: Kristin [name] MRN: [id number] Date of Birth: 05/04/1945  Subjective/Objective:     Admitted to [facility] under observation status with the diagnosis of encephalopathy. Lives with husband, [name] [phone]. Last seen Dr. Candiss Norse 3 months ago. Prescriptions are filled at [facility] on [address].  Home oxygen per [facility] x 4 years.  Wears 4 liters per nasal cannula continuous. [facility] many years ago. No skilled nursing. Rolling walker, nebulizer, grab bars and [employer] in the home. Takes care of all basic activities of daily living herself, doesn't drive. Last fall was 5-6 months ago.    Decreased appetite for a long time. Husband will transport.         Action/Plan: physical therapy evaluation completed. Recommending home with home health and physical therapy. Would like [facility] again.  Will update Floydene Flock, [facility] representative.    Expected Discharge Date:                  Expected Discharge Plan:     In-House Referral:   yes  Discharge planning Services     Post Acute Care Choice:   yes Choice offered to:   patient  DME Arranged:    DME Agency:     HH Arranged:   yes HH Agency:   [facility]  Status of Service:     If discussed at [place] of Stay Meetings, dates discussed:    Additional Comments:  Shelbie Ammons, RN MSN CCM Care Management [phone] 12/01/2016, 10:21 AM

## 2016-12-01 NOTE — Discharge Instructions (Signed)
Resume diet and activity as before ° ° °

## 2016-12-01 NOTE — Progress Notes (Signed)
Physical Therapy Evaluation Patient Details Name: Kristin [name] MRN: [id number] DOB: [birth year] Today's Date: 12/01/2016   History of Present Illness  Pt admitted for acute encephalopathy secondary to UTI. PMH of COPD, HTN, DM, recurrent UTIs  Clinical Impression  Pt is a pleasant 71 year old female who was admitted for acute encephalopathy. Pt performed seated there-ex, bed mobility with supervision, transfers/amb with RW with CGA. Pt amb from bed to bathroom, then amb in hallway and returned to recliner, on 2L of oxygen. Monitored SpO2 throughout session, 91% on room air in supine, 94% in bathroom, 91% while amb, 95% in recliner on 2L. Pt demonstrates deficits with ability to transfer from sit-to-stand with minimal time, and decreased endurance with amb community distances. . Recommend use of RW at this time. Would benefit from skilled PT to address above deficits and promote optimal return to home. Recommend transition to [place] upon DC from acute hospitalization. This entire session was guided, instructed, and directly supervised by Tera Helper, DPT.    Follow Up Recommendations Home health PT    Equipment Recommendations  None recommended by PT    Recommendations for Other Services       Precautions / Restrictions Precautions Precautions: Fall Restrictions Weight Bearing Restrictions: No      Mobility  Bed Mobility Overal bed mobility: Needs Assistance Bed Mobility: Supine to Sit     Supine to sit: Supervision     General bed mobility comments: Pt able to go from supine to seated EOB with proper technique, no cues needed  Transfers Overall transfer level: Needs assistance Equipment used: Rolling walker (2 wheeled) Transfers: Sit to/from Stand Sit to Stand: Min guard         General transfer comment: Pt able to transfer with RW with no cues needed. Pt requires one or two tries before being able to come to a complete stand. Once in standing able to maintain  position.   Ambulation/Gait Ambulation/Gait assistance: Min guard Ambulation Distance (Feet): 130 Feet Assistive device: Rolling walker (2 wheeled) Gait Pattern/deviations: Step-through pattern     General Gait Details: Pt able to amb with alternating step-through pattern, no cues needed. Pt able to navigate RW in room and in hallway. Pt amb from bed to bathroom, into hallway and back to chair in room.   Stairs            Wheelchair Mobility    Modified Rankin (Stroke Patients Only)       Balance Overall balance assessment: Needs assistance Sitting-balance support: Feet unsupported (Feet did not reach floor with bed at lowest height) Sitting balance-Leahy Scale: Good Sitting balance - Comments: Pt able to sit upright at EOB, no dizziness   Standing balance support: Bilateral upper extremity supported Standing balance-Leahy Scale: Good Standing balance comment: Pt able to stand upright, able to perform toileting with one hand off walker, and wash both hands at the sink with no LOB                              Pertinent Vitals/Pain Pain Assessment: No/denies pain    Home Living Family/patient expects to be discharged to:: Private residence Living Arrangements: Spouse/significant other Available Help at Discharge: Family Type of Home: House Home Access: Stairs to enter Entrance Stairs-Rails: None Entrance Stairs-Number of Steps: 1 Home Layout: One level Home Equipment: Walker - 2 wheels      Prior Function Level of Independence: Independent  Comments: Pt is able to perform ADLs in the home, occasionally spends time outside of home with husband     Hand Dominance        Extremity/Trunk Assessment   Upper Extremity Assessment Upper Extremity Assessment: Generalized weakness (UE MMT grossly 4/5)    Lower Extremity Assessment Lower Extremity Assessment: Generalized weakness (LE MMT grossly 4/5)    Cervical / Trunk  Assessment Cervical / Trunk Assessment: Normal  Communication   Communication: No difficulties  Cognition Arousal/Alertness: Awake/alert Behavior During Therapy: WFL for tasks assessed/performed Overall Cognitive Status: Within Functional Limits for tasks assessed                                        General Comments      Exercises Other Exercises Other Exercises: Seated ther-ex 10x, B ankle pumps, alternating marches. Able to perform with proper technique, no cues needed. Other Exercises: (P) Took patient to bathroom, amb with RW, patient able to sit/stand from toilet and walk to sink to wash hands. CGA throughout, pt did not require cues,    Assessment/Plan    PT Assessment Patient needs continued PT services  PT Problem List Decreased strength;Decreased knowledge of use of DME;Cardiopulmonary status limiting activity;Decreased range of motion;Decreased activity tolerance       PT Treatment Interventions DME instruction;Gait training;Stair training;Therapeutic activities;Therapeutic exercise;Patient/family education    PT Goals (Current goals can be found in the Care Plan section)  Acute Rehab PT Goals Patient Stated Goal: to return home PT Goal Formulation: With patient Time For Goal Achievement: 12/15/16 Potential to Achieve Goals: Good Additional Goals Additional Goal #1: Pt will increase UE/LE strength to be able to sit-to-stand without needing several attempts    Frequency Min 2X/week   Barriers to discharge        Co-evaluation               AM-PAC PT "6 Clicks" Daily Activity  Outcome Measure Difficulty turning over in bed (including adjusting bedclothes, sheets and blankets)?: None Difficulty moving from lying on back to sitting on the side of the bed? : None Difficulty sitting down on and standing up from a chair with arms (e.g., wheelchair, bedside commode, etc,.)?: Unable Help needed moving to and from a bed to chair (including a  wheelchair)?: A Little Help needed walking in hospital room?: A Little Help needed climbing 3-5 steps with a railing? : A Little 6 Click Score: 18    End of Session Equipment Utilized During Treatment: Gait belt Activity Tolerance: Patient tolerated treatment well Patient left: in chair;with call bell/phone within reach;with chair alarm set Nurse Communication: Mobility status PT Visit Diagnosis: Other abnormalities of gait and mobility (R26.89);Muscle weakness (generalized) (M62.81)    Time: 0093-8182 PT Time Calculation (min) (ACUTE ONLY): 28 min   Charges:   PT Evaluation $PT Eval Low Complexity: 1 Low PT Treatments $Therapeutic Exercise: 8-22 mins   PT G Codes:   PT G-Codes **NOT FOR INPATIENT CLASS** Functional Assessment Tool Used: AM-PAC 6 Clicks Basic Mobility Functional Limitation: Mobility: Walking and moving around Mobility: Walking and Moving Around Current Status (X9371): At least 40 percent but less than 60 percent impaired, limited or restricted Mobility: Walking and Moving Around Goal Status [phone]): At least 20 percent but less than 40 percent impaired, limited or restricted    Manfred Arch, SPT  Manfred Arch 12/01/2016, 12:15 PM

## 2016-12-01 NOTE — Care Management Obs Status (Signed)
Rule NOTIFICATION   Patient Details  Name: Kristin [name] MRN: [id number] Date of Birth: Jun 26, 1945   Medicare Observation Status Notification Given:  Yes    Shelbie Ammons, RN 12/01/2016, 10:21 AM

## 2016-12-01 NOTE — Progress Notes (Signed)
Discharge instructions along with home medications and follow up gone over with patient and husband. Both verbalize that they understood instructions. No prescriptions given to patient. IV's removed. Pt being discharged home on room air, no distress noted. Ammie Dalton, RN

## 2016-12-03 LAB — URINE CULTURE: Culture: >=100000 — AB

## 2016-12-03 NOTE — Care Management (Signed)
12/03/16- This RNCM received notification from [facility] that patient's husband had declined home health services for patient. I contacted patient by phone and verified. She said, "I'm just not in the mood for them right now".  She said she "has ecoli and had to [facility] in [place] and get a different prescription". She still does not want home health. [facility] updated.

## 2016-12-04 NOTE — Discharge Summary (Signed)
[place] [facility] [place] NAME: Kristin [name]    MR#:  [id number]  DATE OF BIRTH:  08/12/1945  DATE OF ADMISSION:  11/30/2016 ADMITTING PHYSICIAN: Idelle Crouch, MD  DATE OF DISCHARGE: 12/01/2016  1:01 PM  PRIMARY CARE PHYSICIAN: Rubye Beach, MD   ADMISSION DIAGNOSIS:  Generalized weakness [R53.1] Urinary tract infection without hematuria, site unspecified [N39.0] Altered mental status, unspecified altered mental status type [R41.82]  DISCHARGE DIAGNOSIS:  Principal Problem:   Encephalopathy acute Active Problems:   UTI (urinary tract infection)   Chronic respiratory failure with hypoxia (HCC)   Weakness generalized   Acute encephalopathy   SECONDARY DIAGNOSIS:   Past Medical History:  Diagnosis Date  . Asthma   . Cancer [facility]) 2010   Left ear cancer  . Diabetes mellitus without complication ([place])   . Hypertension      ADMITTING HISTORY  HPI: Kristin [name] is a 71 y.o. female has a past medical history significant for COPD on O2, HTN, DM, and recurrent UTI's who presents to ER with 3 days hx of worsening confusion and weakness. On po Cipro x 3 days for UTI. In ER, UA grossly abnormal. Pt too weak to ambulate. Husband unable to care for patient at home. She is now admitted. No N/V/D. Denies CP or worsening SOB. Does c/o anorexia and low grade fever at home  HOSPITAL COURSE:   * UTI * Acute toxic encephalopathy * Dehydration * Generalized weakness * COPD with chronic resp failure * Hypertension * DM2  Admitted to medical floor and started on IV abx and IVF. She improved well. Had PT evaluate and recommended HH which has been set up. Changed to PO abx and discharged home with family. Patient back to baseline without any confusion.  CONSULTS OBTAINED:    DRUG ALLERGIES:   Allergies  Allergen Reactions  . Codeine Nausea Only  . Morphine And Related Nausea Only    DISCHARGE MEDICATIONS:   Discharge  Medication List as of 12/01/2016 11:08 AM    START taking these medications   Details  cephALEXin (KEFLEX) 250 MG capsule Take 1 capsule (250 mg total) by mouth 3 (three) times daily., Starting Mon 12/01/2016, Until Thu 12/04/2016, Normal      CONTINUE these medications which have NOT CHANGED   Details  aspirin EC 81 MG EC tablet Take 1 tablet (81 mg total) by mouth daily., Starting Wed 10/24/2015, Normal    atorvastatin (LIPITOR) 40 MG tablet Take 1 tablet by mouth daily at 6 PM., Starting Fri 10/12/2015, Historical Med    clonazePAM (KLONOPIN) 1 MG tablet Take 1 tablet by mouth daily., Starting Mon 10/01/2015, Historical Med    CRANBERRY EXTRACT PO Take 4,200 mg by mouth daily., Historical Med    fenofibrate 160 MG tablet Take 1 tablet by mouth daily., Starting Sat 10/06/2015, Historical Med    ferrous sulfate 325 (65 FE) MG tablet Take 325 mg by mouth daily., Historical Med    Fluticasone-Salmeterol (ADVAIR DISKUS) 250-50 MCG/DOSE AEPB Inhale 1 puff into the lungs 2 (two) times daily., Starting Mon 05/26/2016, Normal    furosemide (LASIX) 40 MG tablet Take 40 mg by mouth daily., Starting Fri 08/03/2015, Historical Med    ipratropium-albuterol (DUONEB) 0.5-2.5 (3) MG/3ML SOLN Take 3 mLs by nebulization every 4 (four) hours as needed., Starting Mon 05/26/2016, Normal    JANUMET XR [phone] MG TB24 Take 1 tablet by mouth daily., Starting Sat 10/06/2015, Historical Med    ketorolac (  TORADOL) 10 MG tablet Take 1 tablet (10 mg total) by mouth every 6 (six) hours as needed., Starting Mon 05/26/2016, Normal    levofloxacin (LEVAQUIN) 500 MG tablet Take 1 tablet (500 mg total) by mouth daily. X 5 more days, Starting Mon 05/26/2016, Normal    levothyroxine (SYNTHROID, LEVOTHROID) 112 MCG tablet Take 1 tablet by mouth daily., Starting Sat 09/15/2015, Historical Med    metoCLOPramide (REGLAN) 5 MG tablet Take 1 tablet (5 mg total) by mouth 4 (four) times daily -  before meals and at bedtime., Starting Mon  05/26/2016, Normal    Omega-3 Fatty Acids (FISH OIL) 1000 MG CAPS Take 1 capsule by mouth daily., Historical Med    pantoprazole (PROTONIX) 40 MG tablet Take 1 tablet by mouth 2 (two) times daily with breakfast and lunch., Starting Sat 10/20/2015, Historical Med    Potassium 99 MG TABS Take 198 mg by mouth daily., Historical Med    pramipexole (MIRAPEX) 0.25 MG tablet Take 0.25 mg by mouth at bedtime., Starting Sun 05/04/2016, Historical Med    predniSONE (STERAPRED UNI-PAK 21 TAB) 10 MG (21) TBPK tablet 6 tabs PO x 1 day 5 tabs PO x 1 day 4 tabs PO x 1 day 3 tabs PO x 1 day 2 tabs PO x 1 day 1 tab PO x 1 day and stop, Normal    promethazine (PHENERGAN) 25 MG tablet Take 25 mg by mouth every 6 (six) hours as needed. , Starting Fri 09/14/2015, Historical Med    SPIRIVA HANDIHALER 18 MCG inhalation capsule Place 18 mcg into inhaler and inhale daily., Starting Tue 05/13/2016, Historical Med    traZODone (DESYREL) 100 MG tablet Take 100 mg by mouth at bedtime. , Starting Fri 09/07/2015, Historical Med    VENTOLIN HFA 108 (90 Base) MCG/ACT inhaler Inhale 2 puffs into the lungs every 6 (six) hours as needed. , Starting Thu 10/11/2015, Historical Med    vitamin B-12 (CYANOCOBALAMIN) 1000 MCG tablet Take 1,000 mcg by mouth daily., Historical Med        Today   VITAL SIGNS:  Blood pressure (!) 130/52, pulse 69, temperature 98.2 F (36.8 C), temperature source Oral, resp. rate 20, height 5\' 5"  (1.651 m), weight 56.3 kg (124 lb 1.6 oz), SpO2 96 %.  I/O:  No intake or output data in the 24 hours ending 12/04/16 1012  PHYSICAL EXAMINATION:  Physical Exam  GENERAL:  71 y.o.-year-old patient lying in the bed with no acute distress.  LUNGS: Normal breath sounds bilaterally, no wheezing, rales,rhonchi or crepitation. No use of accessory muscles of respiration.  CARDIOVASCULAR: S1, S2 normal. No murmurs, rubs, or gallops.  ABDOMEN: Soft, non-tender, non-distended. Bowel sounds present. No  organomegaly or mass.  NEUROLOGIC: Moves all 4 extremities. PSYCHIATRIC: The patient is alert and oriented x 3.  SKIN: No obvious rash, lesion, or ulcer.   DATA REVIEW:   CBC  Recent Labs Lab 12/01/16 0515  WBC 7.9  HGB 10.5*  HCT 30.6*  PLT 174    Chemistries   Recent Labs Lab 12/01/16 0515  NA 140  K 3.4*  CL 108  CO2 26  GLUCOSE 111*  BUN 11  CREATININE 0.99  CALCIUM 8.8*  AST 15  ALT 11*  ALKPHOS 38  BILITOT 0.7    Cardiac Enzymes  Recent Labs Lab 11/30/16 1515  TROPONINI <0.03    Microbiology Results  Results for orders placed or performed during the hospital encounter of 11/30/16  Blood Culture (routine x 2)  Status: None (Preliminary result)   Collection Time: 11/30/16  3:16 PM  Result Value Ref Range Status   Specimen Description BLOOD LEFT ARM  Final   Special Requests   Final    BOTTLES DRAWN AEROBIC AND ANAEROBIC Blood Culture adequate volume   Culture NO GROWTH 4 DAYS  Final   Report Status PENDING  Incomplete  Blood Culture (routine x 2)     Status: None (Preliminary result)   Collection Time: 11/30/16  3:37 PM  Result Value Ref Range Status   Specimen Description BLOOD RIGHT ARM  Final   Special Requests   Final    BOTTLES DRAWN AEROBIC AND ANAEROBIC Blood Culture adequate volume   Culture NO GROWTH 4 DAYS  Final   Report Status PENDING  Incomplete  Urine culture     Status: Abnormal   Collection Time: 11/30/16  4:06 PM  Result Value Ref Range Status   Specimen Description URINE, RANDOM  Final   Special Requests NONE  Final   Culture (A)  Final    >=100,000 COLONIES/mL ESCHERICHIA COLI Confirmed Extended Spectrum Beta-Lactamase Producer (ESBL) Performed at [facility], [place] [address], [place], [place] [address]    Report Status 12/03/2016 FINAL  Final   Organism ID, Bacteria ESCHERICHIA COLI (A)  Final      Susceptibility   Escherichia coli - MIC*    AMPICILLIN >=32 RESISTANT Resistant     CEFAZOLIN >=64 RESISTANT  Resistant     CEFTRIAXONE >=64 RESISTANT Resistant     CIPROFLOXACIN >=4 RESISTANT Resistant     GENTAMICIN <=1 SENSITIVE Sensitive     IMIPENEM <=0.25 SENSITIVE Sensitive     NITROFURANTOIN <=16 SENSITIVE Sensitive     TRIMETH/SULFA >=320 RESISTANT Resistant     AMPICILLIN/SULBACTAM 16 INTERMEDIATE Intermediate     PIP/TAZO <=4 SENSITIVE Sensitive     Extended ESBL POSITIVE Resistant     * >=100,000 COLONIES/mL ESCHERICHIA COLI    RADIOLOGY:  No results found.  Follow up with PCP in 1 week.  Management plans discussed with the patient, family and they are in agreement.  CODE STATUS:  Code Status History    Date Active Date Inactive Code Status Order ID Comments User Context   11/30/2016  7:12 PM 12/01/2016  4:22 PM Full Code [id number]  Idelle Crouch, MD Inpatient   10/23/2015 12:50 AM 10/23/2015  3:04 PM Full Code [id number]  Hugelmeyer, Ubaldo Glassing, DO ED      TOTAL TIME TAKING CARE OF THIS PATIENT ON DAY OF DISCHARGE: more than 30 minutes.   Hillary Bow R M.D on 12/04/2016 at 10:12 AM  Between 7am to 6pm - Pager - [phone]  After 6pm go to [email or web address] - password [email or web address] [facility]  Office  [phone]  CC: Primary care physician; Rubye Beach, MD  Note: This dictation was prepared with Dragon dictation along with smaller phrase technology. Any transcriptional errors that result from this process are unintentional.

## 2016-12-05 LAB — CULTURE, BLOOD (ROUTINE X 2)
Culture: NO GROWTH
Culture: NO GROWTH
SPECIAL REQUESTS: ADEQUATE
Special Requests: ADEQUATE

## 2017-06-24 ENCOUNTER — Encounter: Payer: Self-pay | Admitting: Emergency Medicine

## 2017-06-24 ENCOUNTER — Emergency Department
Admission: EM | Admit: 2017-06-24 | Discharge: 2017-06-24 | Disposition: A | Discharge status: Home / Self Care | Payer: Medicare Other | Attending: Student in an Organized Health Care Education/Training Program | Admitting: Student in an Organized Health Care Education/Training Program

## 2017-06-24 ENCOUNTER — Emergency Department: Payer: Medicare Other

## 2017-06-24 DIAGNOSIS — J441 Chronic obstructive pulmonary disease with (acute) exacerbation: Secondary | ICD-10-CM | POA: Diagnosis not present

## 2017-06-24 DIAGNOSIS — R0602 Shortness of breath: Secondary | ICD-10-CM | POA: Diagnosis present

## 2017-06-24 DIAGNOSIS — Z79899 Other long term (current) drug therapy: Secondary | ICD-10-CM | POA: Insufficient documentation

## 2017-06-24 DIAGNOSIS — I10 Essential (primary) hypertension: Secondary | ICD-10-CM | POA: Insufficient documentation

## 2017-06-24 DIAGNOSIS — J45909 Unspecified asthma, uncomplicated: Secondary | ICD-10-CM | POA: Diagnosis not present

## 2017-06-24 DIAGNOSIS — Z7984 Long term (current) use of oral hypoglycemic drugs: Secondary | ICD-10-CM | POA: Diagnosis not present

## 2017-06-24 DIAGNOSIS — Z87891 Personal history of nicotine dependence: Secondary | ICD-10-CM | POA: Diagnosis not present

## 2017-06-24 DIAGNOSIS — R079 Chest pain, unspecified: Secondary | ICD-10-CM | POA: Insufficient documentation

## 2017-06-24 DIAGNOSIS — Z7982 Long term (current) use of aspirin: Secondary | ICD-10-CM | POA: Insufficient documentation

## 2017-06-24 DIAGNOSIS — Z85828 Personal history of other malignant neoplasm of skin: Secondary | ICD-10-CM | POA: Diagnosis not present

## 2017-06-24 DIAGNOSIS — E119 Type 2 diabetes mellitus without complications: Secondary | ICD-10-CM | POA: Diagnosis not present

## 2017-06-24 HISTORY — DX: Emphysema, unspecified: J43.9

## 2017-06-24 HISTORY — DX: Chronic obstructive pulmonary disease, unspecified: J44.9

## 2017-06-24 LAB — COMPREHENSIVE METABOLIC PANEL
ALBUMIN: 4.6 g/dL (ref 3.5–5.0)
ALK PHOS: 43 U/L (ref 38–126)
ALT: 13 U/L — AB (ref 14–54)
AST: 17 U/L (ref 15–41)
Anion gap: 7 (ref 5–15)
BILIRUBIN TOTAL: 0.6 mg/dL (ref 0.3–1.2)
BUN: 18 mg/dL (ref 6–20)
CO2: 26 mmol/L (ref 22–32)
Calcium: 9.1 mg/dL (ref 8.9–10.3)
Chloride: 107 mmol/L (ref 101–111)
Creatinine, Ser: 0.83 mg/dL (ref 0.44–1.00)
GFR calc Af Amer: >60 mL/min (ref >60)
GFR calc non Af Amer: >60 mL/min (ref >60)
GLUCOSE: 92 mg/dL (ref 65–99)
POTASSIUM: 3.9 mmol/L (ref 3.5–5.1)
Sodium: 140 mmol/L (ref 135–145)
TOTAL PROTEIN: 7.3 g/dL (ref 6.5–8.1)

## 2017-06-24 LAB — CBC WITH DIFFERENTIAL/PLATELET
BASOS ABS: 0.1 10*3/uL (ref 0–0.1)
BASOS PCT: 1 %
Eosinophils Absolute: 0.4 10*3/uL (ref 0–0.7)
Eosinophils Relative: 6 %
HEMATOCRIT: 36.5 % (ref 35.0–47.0)
HEMOGLOBIN: 12 g/dL (ref 12.0–16.0)
Lymphocytes Relative: 27 %
Lymphs Abs: 1.8 10*3/uL (ref 1.0–3.6)
MCH: 32.4 pg (ref 26.0–34.0)
MCHC: 32.9 g/dL (ref 32.0–36.0)
MCV: 98.3 fL (ref 80.0–100.0)
Monocytes Absolute: 0.5 10*3/uL (ref 0.2–0.9)
Monocytes Relative: 8 %
NEUTROS ABS: 3.8 10*3/uL (ref 1.4–6.5)
Neutrophils Relative %: 58 %
Platelets: 252 10*3/uL (ref 150–440)
RBC: 3.71 MIL/uL — AB (ref 3.80–5.20)
RDW: 14.2 % (ref 11.5–14.5)
WBC: 6.6 10*3/uL (ref 3.6–11.0)

## 2017-06-24 LAB — TROPONIN I
Troponin I: <0.03 ng/mL (ref <0.03)
Troponin I: <0.03 ng/mL (ref <0.03)

## 2017-06-24 LAB — BRAIN NATRIURETIC PEPTIDE: B Natriuretic Peptide: 47 pg/mL (ref 0.0–100.0)

## 2017-06-24 MED ORDER — IOHEXOL 350 MG/ML SOLN
75.0000 mL | Freq: Once | INTRAVENOUS | Status: AC | PRN
  Administered 2017-06-24: 75 mL via INTRAVENOUS

## 2017-06-24 MED ORDER — IPRATROPIUM-ALBUTEROL 0.5-2.5 (3) MG/3ML IN SOLN
3.0000 mL | Freq: Once | RESPIRATORY_TRACT | Status: AC
  Administered 2017-06-24: 3 mL via RESPIRATORY_TRACT
  Filled 2017-06-24: qty 3

## 2017-06-24 MED ORDER — ALBUTEROL SULFATE (2.5 MG/3ML) 0.083% IN NEBU
2.5000 mg | INHALATION_SOLUTION | Freq: Once | RESPIRATORY_TRACT | Status: AC
  Administered 2017-06-24: 2.5 mg via RESPIRATORY_TRACT
  Filled 2017-06-24: qty 3

## 2017-06-24 MED ORDER — LORAZEPAM 0.5 MG PO TABS
0.5000 mg | ORAL_TABLET | Freq: Once | ORAL | Status: AC
Start: 2017-06-24 — End: 2017-06-24
  Administered 2017-06-24: 0.5 mg via ORAL
  Filled 2017-06-24: qty 1

## 2017-06-24 MED ORDER — METHYLPREDNISOLONE SODIUM SUCC 125 MG IJ SOLR
60.0000 mg | Freq: Once | INTRAMUSCULAR | Status: AC
Start: 2017-06-24 — End: 2017-06-24
  Administered 2017-06-24: 60 mg via INTRAVENOUS
  Filled 2017-06-24: qty 2

## 2017-06-24 MED ORDER — LORAZEPAM 0.5 MG PO TABS
0.5000 mg | ORAL_TABLET | Freq: Once | ORAL | Status: AC
  Administered 2017-06-24: 0.5 mg via ORAL
  Filled 2017-06-24: qty 1

## 2017-06-24 NOTE — ED Triage Notes (Signed)
Pt comes into the ED via POV from home c/o shortness of breath that started this morning.  Patient chronically wears 4L nasal cannula due to COPD and emphysema.  Patient working hard to breathe currently with use of accessory muscles.

## 2017-06-24 NOTE — ED Provider Notes (Signed)
[facility] Emergency Department Provider Note    First MD Initiated Contact with Patient 06/24/17 1459     (approximate)  I have reviewed the triage vital signs and the nursing notes.   HISTORY  Chief Complaint Shortness of Breath    HPI Kristin [name] is a 72 y.o. female with a history of asthma COPD and emphysema as well as diabetes and hypertension presents to the ER with 1 day of worsening shortness of breath as well as left chest pressure.  Denies any fevers but states that she felt the same when she was diagnosed with pneumonia.  Does not smoke.  No abdominal pain.  No nausea or vomiting.  No measured fevers at home.  Did take a nebulizer treatment at home with some improvement.  Past Medical History:  Diagnosis Date  . Asthma   . Cancer [facility]) 2010   Left ear cancer  . COPD (chronic obstructive pulmonary disease) ([place])   . Diabetes mellitus without complication ([place])   . Emphysema lung ([place])   . Hypertension    No family history on file. History reviewed. No pertinent surgical history. Patient Active Problem List   Diagnosis Date Noted  . UTI (urinary tract infection) 11/30/2016  . Encephalopathy acute 11/30/2016  . Chronic respiratory failure with hypoxia ([place]) 11/30/2016  . Weakness generalized 11/30/2016  . Acute encephalopathy 11/30/2016  . Acute respiratory failure ([place]) 05/23/2016  . Malnutrition of moderate degree 05/23/2016  . Syncope 10/23/2015      Prior to Admission medications   Medication Sig Start Date End Date Taking? Authorizing Provider  aspirin EC 81 MG EC tablet Take 1 tablet (81 mg total) by mouth daily. Patient taking differently: Take 162 mg by mouth daily.  10/24/15   Loletha Grayer, MD  atorvastatin (LIPITOR) 40 MG tablet Take 1 tablet by mouth daily at 6 PM. 10/12/15   [provider]  clonazePAM (KLONOPIN) 1 MG tablet Take 1 tablet by mouth daily. 10/01/15   [provider]  CRANBERRY  EXTRACT PO Take 4,200 mg by mouth daily.    [provider]  fenofibrate 160 MG tablet Take 1 tablet by mouth daily. 10/06/15   [provider]  ferrous sulfate 325 (65 FE) MG tablet Take 325 mg by mouth daily.    [provider]  Fluticasone-Salmeterol (ADVAIR DISKUS) 250-50 MCG/DOSE AEPB Inhale 1 puff into the lungs 2 (two) times daily. 05/26/16   Gladstone Lighter, MD  furosemide (LASIX) 40 MG tablet Take 40 mg by mouth daily. 08/03/15   [provider]  ipratropium-albuterol (DUONEB) 0.5-2.5 (3) MG/3ML SOLN Take 3 mLs by nebulization every 4 (four) hours as needed. 05/26/16   Gladstone Lighter, MD  JANUMET XR [phone] MG TB24 Take 1 tablet by mouth daily. 10/06/15   [provider]  ketorolac (TORADOL) 10 MG tablet Take 1 tablet (10 mg total) by mouth every 6 (six) hours as needed. 05/26/16   Gladstone Lighter, MD  levofloxacin (LEVAQUIN) 500 MG tablet Take 1 tablet (500 mg total) by mouth daily. X 5 more days 05/26/16   Gladstone Lighter, MD  levothyroxine (SYNTHROID, LEVOTHROID) 112 MCG tablet Take 1 tablet by mouth daily. 09/15/15   [provider]  metoCLOPramide (REGLAN) 5 MG tablet Take 1 tablet (5 mg total) by mouth 4 (four) times daily -  before meals and at bedtime. 05/26/16   Gladstone Lighter, MD  Omega-3 Fatty Acids (FISH OIL) 1000 MG CAPS Take 1 capsule by mouth daily.  [provider]  pantoprazole (PROTONIX) 40 MG tablet Take 1 tablet by mouth 2 (two) times daily with breakfast and lunch. 10/20/15   [provider]  Potassium 99 MG TABS Take 198 mg by mouth daily.    [provider]  pramipexole (MIRAPEX) 0.25 MG tablet Take 0.25 mg by mouth at bedtime. 05/04/16   [provider]  predniSONE (STERAPRED UNI-PAK 21 TAB) 10 MG (21) TBPK tablet 6 tabs PO x 1 day 5 tabs PO x 1 day 4 tabs PO x 1 day 3 tabs PO x 1 day 2 tabs PO x 1 day 1 tab PO x 1 day and stop 05/26/16   Gladstone Lighter, MD    promethazine (PHENERGAN) 25 MG tablet Take 25 mg by mouth every 6 (six) hours as needed.  09/14/15   [provider]  SPIRIVA HANDIHALER 18 MCG inhalation capsule Place 18 mcg into inhaler and inhale daily. 05/13/16   [provider]  traZODone (DESYREL) 100 MG tablet Take 100 mg by mouth at bedtime.  09/07/15   [provider]  VENTOLIN HFA 108 (90 Base) MCG/ACT inhaler Inhale 2 puffs into the lungs every 6 (six) hours as needed.  10/11/15   [provider]  vitamin B-12 (CYANOCOBALAMIN) 1000 MCG tablet Take 1,000 mcg by mouth daily.    [provider]    Allergies Codeine and Morphine and related    Social History Social History   Tobacco Use  . Smoking status: Former Research scientist (life sciences)  . Smokeless tobacco: Never Used  Substance Use Topics  . Alcohol use: No  . Drug use: No    Review of Systems Patient denies headaches, rhinorrhea, blurry vision, numbness, shortness of breath, chest pain, edema, cough, abdominal pain, nausea, vomiting, diarrhea, dysuria, fevers, rashes or hallucinations unless otherwise stated above in HPI. ____________________________________________   PHYSICAL EXAM:  VITAL SIGNS: Vitals:   06/24/17 1845 06/24/17 1900  BP:    Pulse: 88 86  Resp: (!) 25 19  Temp:    SpO2: 97% 98%    Constitutional: Alert and oriented. Ill appearing in moderate resp distress. Eyes: Conjunctivae are normal.  Head: Atraumatic. Nose: No congestion/rhinnorhea. Mouth/Throat: Mucous membranes are moist.   Neck: No stridor. Painless ROM.  Cardiovascular: Normal rate, regular rhythm. Grossly normal heart sounds.  Good peripheral circulation. Respiratory: tachypnea with use of accessory muscles, prolonged expiratory phase, no rhonchi Gastrointestinal: Soft and nontender. No distention. No abdominal bruits. No CVA tenderness. Genitourinary:  Musculoskeletal: No lower extremity tenderness nor edema.  No joint effusions. Neurologic:  Normal  speech and language. No gross focal neurologic deficits are appreciated. No facial droop Skin:  Skin is warm, dry and intact. No rash noted. Psychiatric: Mood and affect are normal. Speech and behavior are normal.  ____________________________________________   LABS (all labs ordered are listed, but only abnormal results are displayed)  Results for orders placed or performed during the hospital encounter of 06/24/17 (from the past 24 hour(s))  CBC with Differential/Platelet     Status: Abnormal   Collection Time: 06/24/17  3:12 PM  Result Value Ref Range   WBC 6.6 3.6 - 11.0 K/uL   RBC 3.71 (L) 3.80 - 5.20 MIL/uL   Hemoglobin 12.0 12.0 - 16.0 g/dL   HCT 36.5 35.0 - 47.0 %   MCV 98.3 80.0 - 100.0 fL   MCH 32.4 26.0 - 34.0 pg   MCHC 32.9 32.0 - 36.0 g/dL   RDW 14.2 11.5 - 14.5 %   Platelets  252 150 - 440 K/uL   Neutrophils Relative % 58 %   Neutro Abs 3.8 1.4 - 6.5 K/uL   Lymphocytes Relative 27 %   Lymphs Abs 1.8 1.0 - 3.6 K/uL   Monocytes Relative 8 %   Monocytes Absolute 0.5 0.2 - 0.9 K/uL   Eosinophils Relative 6 %   Eosinophils Absolute 0.4 0 - 0.7 K/uL   Basophils Relative 1 %   Basophils Absolute 0.1 0 - 0.1 K/uL  Comprehensive metabolic panel     Status: Abnormal   Collection Time: 06/24/17  3:12 PM  Result Value Ref Range   Sodium 140 135 - 145 mmol/L   Potassium 3.9 3.5 - 5.1 mmol/L   Chloride 107 101 - 111 mmol/L   CO2 26 22 - 32 mmol/L   Glucose, Bld 92 65 - 99 mg/dL   BUN 18 6 - 20 mg/dL   Creatinine, Ser 0.83 0.44 - 1.00 mg/dL   Calcium 9.1 8.9 - 10.3 mg/dL   Total Protein 7.3 6.5 - 8.1 g/dL   Albumin 4.6 3.5 - 5.0 g/dL   AST 17 15 - 41 U/L   ALT 13 (L) 14 - 54 U/L   Alkaline Phosphatase 43 38 - 126 U/L   Total Bilirubin 0.6 0.3 - 1.2 mg/dL   GFR calc non Af Amer >60 >60 mL/min   GFR calc Af Amer >60 >60 mL/min   Anion gap 7 5 - 15  Troponin I     Status: None   Collection Time: 06/24/17  3:12 PM  Result Value Ref Range   Troponin I <0.03 <0.03 ng/mL   Brain natriuretic peptide     Status: None   Collection Time: 06/24/17  3:12 PM  Result Value Ref Range   B Natriuretic Peptide 47.0 0.0 - 100.0 pg/mL  Troponin I     Status: None   Collection Time: 06/24/17  6:17 PM  Result Value Ref Range   Troponin I <0.03 <0.03 ng/mL   ____________________________________________  EKG My review and personal interpretation at Time: 15:07   Indication: sob  Rate: 80  Rhythm: sinus Axis: normal Other: normal intervals, no stemi,  ____________________________________________  RADIOLOGY  I personally reviewed all radiographic images ordered to evaluate for the above acute complaints and reviewed radiology reports and findings.  These findings were personally discussed with the patient.  Please see medical record for radiology report.  ____________________________________________   PROCEDURES  Procedure(s) performed:  Procedures    Critical Care performed: no ____________________________________________   INITIAL IMPRESSION / ASSESSMENT AND PLAN / ED COURSE  Pertinent labs & imaging results that were available during my care of the patient were reviewed by me and considered in my medical decision making (see chart for details).  DDX: Asthma, copd, CHF, pna, ptx, malignancy, Pe, anemia   Kristin [name] is a 72 y.o. who presents to the ED with his of breath and chest pain as described above.  Does not appear clinically consistent with ACS or dissection.  EKG is nonischemic and initial troponin is negative.  Chest x-ray shows no evidence of pneumonia congestive heart failure.  Blood work is reassuring.  Will start with nebulizer treatments as well as a dose of Solu-Medrol and reassess.  Clinical Course as of Jun 25 1918  Wed Jun 24, 2017  1617 Patient with increased air movement after nebulizer treatment but still tachypneic.  Says that she is on clonazepam and does not member the last time she took that medication  will give her half  dose of Ativan to see if there is any improvement.  Blood work is reassuring troponin is negative.  Will order CT scan as she is at increased risk for PE to exclude that pathology will continue to monitor.   [PR]  1713 Patient reassessed with continued improvement.  Looks much more comfortable.  Is requesting additional Ativan.  Will give additional nebulizers as well.  Will reassess and repeat troponin to rule out component of ACS but patient otherwise well-appearing   [PR]  1918 Patient reassessed.  No respiratory distress.  She is speaking full complete sentences.  Still has some wheeze but increased air movement.  Her repeat troponin is negative.  This point I do believe patient's appropriate for further outpatient follow-up.  Did offer hospitalization for additional nebulizer treatments and observation but patient states that she feels well enough to go home and I think that is reasonable.  Have discussed with the patient and available family all diagnostics and treatments performed thus far and all questions were answered to the best of my ability. The patient demonstrates understanding and agreement with plan.    [PR]    Clinical Course User Index [PR] Merlyn Lot, MD     As part of my medical decision making, I reviewed the following data within the [facility] notes reviewed and incorporated, Labs reviewed, notes from prior ED visits and [place] Controlled Substance Database   ____________________________________________   FINAL CLINICAL IMPRESSION(S) / ED DIAGNOSES  Final diagnoses:  COPD exacerbation ([place])  Shortness of breath      NEW MEDICATIONS STARTED DURING THIS VISIT:  New Prescriptions   No medications on file     Note:  This document was prepared using Dragon voice recognition software and may include unintentional dictation errors.    Merlyn Lot, MD 06/24/17 Lurena Nida

## 2017-06-25 LAB — GLUCOSE, CAPILLARY: Glucose-Capillary: 161 mg/dL — ABNORMAL HIGH (ref 65–99)

## 2017-08-14 ENCOUNTER — Ambulatory Visit
Admission: RE | Admit: 2017-08-14 | Discharge: 2017-08-14 | Disposition: A | Discharge status: Home / Self Care | Payer: Medicare Other | Source: Ambulatory Visit | Attending: Internal Medicine | Admitting: Internal Medicine

## 2017-08-14 ENCOUNTER — Other Ambulatory Visit: Payer: Self-pay | Admitting: Internal Medicine

## 2017-08-14 DIAGNOSIS — R1031 Right lower quadrant pain: Secondary | ICD-10-CM

## 2017-08-14 DIAGNOSIS — K5730 Diverticulosis of large intestine without perforation or abscess without bleeding: Secondary | ICD-10-CM | POA: Diagnosis not present

## 2017-08-14 DIAGNOSIS — Z9049 Acquired absence of other specified parts of digestive tract: Secondary | ICD-10-CM | POA: Diagnosis not present

## 2017-08-14 DIAGNOSIS — Z90710 Acquired absence of both cervix and uterus: Secondary | ICD-10-CM | POA: Diagnosis not present

## 2017-08-14 DIAGNOSIS — M545 Low back pain: Secondary | ICD-10-CM | POA: Insufficient documentation

## 2017-09-02 ENCOUNTER — Other Ambulatory Visit: Payer: Self-pay

## 2017-09-02 ENCOUNTER — Inpatient Hospital Stay
Admission: EM | Admit: 2017-09-02 | Discharge: 2017-09-04 | DRG: 690 | Disposition: A | Discharge status: Home / Self Care | Payer: Medicare Other | Attending: Internal Medicine | Admitting: Internal Medicine

## 2017-09-02 ENCOUNTER — Emergency Department: Payer: Medicare Other

## 2017-09-02 ENCOUNTER — Encounter: Payer: Self-pay | Admitting: Emergency Medicine

## 2017-09-02 DIAGNOSIS — R251 Tremor, unspecified: Secondary | ICD-10-CM | POA: Diagnosis present

## 2017-09-02 DIAGNOSIS — E785 Hyperlipidemia, unspecified: Secondary | ICD-10-CM | POA: Diagnosis present

## 2017-09-02 DIAGNOSIS — N390 Urinary tract infection, site not specified: Secondary | ICD-10-CM | POA: Diagnosis not present

## 2017-09-02 DIAGNOSIS — R51 Headache: Secondary | ICD-10-CM | POA: Diagnosis present

## 2017-09-02 DIAGNOSIS — N10 Acute tubulo-interstitial nephritis: Secondary | ICD-10-CM | POA: Diagnosis not present

## 2017-09-02 DIAGNOSIS — Z885 Allergy status to narcotic agent status: Secondary | ICD-10-CM | POA: Diagnosis not present

## 2017-09-02 DIAGNOSIS — Z8522 Personal history of malignant neoplasm of nasal cavities, middle ear, and accessory sinuses: Secondary | ICD-10-CM | POA: Diagnosis not present

## 2017-09-02 DIAGNOSIS — Z79899 Other long term (current) drug therapy: Secondary | ICD-10-CM | POA: Diagnosis not present

## 2017-09-02 DIAGNOSIS — E119 Type 2 diabetes mellitus without complications: Secondary | ICD-10-CM | POA: Diagnosis present

## 2017-09-02 DIAGNOSIS — N12 Tubulo-interstitial nephritis, not specified as acute or chronic: Secondary | ICD-10-CM | POA: Diagnosis present

## 2017-09-02 DIAGNOSIS — I700 Atherosclerosis of aorta: Secondary | ICD-10-CM | POA: Diagnosis present

## 2017-09-02 DIAGNOSIS — I10 Essential (primary) hypertension: Secondary | ICD-10-CM | POA: Diagnosis present

## 2017-09-02 DIAGNOSIS — Z794 Long term (current) use of insulin: Secondary | ICD-10-CM | POA: Diagnosis not present

## 2017-09-02 DIAGNOSIS — Z87891 Personal history of nicotine dependence: Secondary | ICD-10-CM

## 2017-09-02 DIAGNOSIS — R200 Anesthesia of skin: Secondary | ICD-10-CM | POA: Diagnosis present

## 2017-09-02 DIAGNOSIS — Z9981 Dependence on supplemental oxygen: Secondary | ICD-10-CM

## 2017-09-02 DIAGNOSIS — J439 Emphysema, unspecified: Secondary | ICD-10-CM | POA: Diagnosis present

## 2017-09-02 DIAGNOSIS — B9620 Unspecified Escherichia coli [E. coli] as the cause of diseases classified elsewhere: Secondary | ICD-10-CM | POA: Diagnosis present

## 2017-09-02 DIAGNOSIS — R0600 Dyspnea, unspecified: Secondary | ICD-10-CM

## 2017-09-02 DIAGNOSIS — Z7951 Long term (current) use of inhaled steroids: Secondary | ICD-10-CM | POA: Diagnosis not present

## 2017-09-02 DIAGNOSIS — Z7984 Long term (current) use of oral hypoglycemic drugs: Secondary | ICD-10-CM

## 2017-09-02 LAB — CBC WITH DIFFERENTIAL/PLATELET
BASOS PCT: 0 %
Basophils Absolute: 0 10*3/uL (ref 0–0.1)
EOS ABS: 0.1 10*3/uL (ref 0–0.7)
EOS PCT: 1 %
HCT: 34.3 % — ABNORMAL LOW (ref 35.0–47.0)
Hemoglobin: 11.4 g/dL — ABNORMAL LOW (ref 12.0–16.0)
Lymphocytes Relative: 4 %
Lymphs Abs: 0.7 10*3/uL — ABNORMAL LOW (ref 1.0–3.6)
MCH: 32.3 pg (ref 26.0–34.0)
MCHC: 33.3 g/dL (ref 32.0–36.0)
MCV: 96.9 fL (ref 80.0–100.0)
MONOS PCT: 5 %
Monocytes Absolute: 0.8 10*3/uL (ref 0.2–0.9)
Neutro Abs: 13.7 10*3/uL — ABNORMAL HIGH (ref 1.4–6.5)
Neutrophils Relative %: 90 %
PLATELETS: 249 10*3/uL (ref 150–440)
RBC: 3.54 MIL/uL — AB (ref 3.80–5.20)
RDW: 13.2 % (ref 11.5–14.5)
WBC: 15.3 10*3/uL — AB (ref 3.6–11.0)

## 2017-09-02 LAB — URINALYSIS, COMPLETE (UACMP) WITH MICROSCOPIC
Bacteria, UA: NONE SEEN
Bilirubin Urine: NEGATIVE
GLUCOSE, UA: NEGATIVE mg/dL
Ketones, ur: NEGATIVE mg/dL
NITRITE: POSITIVE — AB
PH: 6 (ref 5.0–8.0)
Protein, ur: 100 mg/dL — AB
SPECIFIC GRAVITY, URINE: 1.014 (ref 1.005–1.030)
Squamous Epithelial / LPF: NONE SEEN (ref 0–5)
WBC, UA: >50 WBC/hpf — ABNORMAL HIGH (ref 0–5)

## 2017-09-02 LAB — TROPONIN I: Troponin I: <0.03 ng/mL (ref <0.03)

## 2017-09-02 LAB — GLUCOSE, CAPILLARY
Glucose-Capillary: 111 mg/dL — ABNORMAL HIGH (ref 65–99)
Glucose-Capillary: 102 mg/dL — ABNORMAL HIGH (ref 65–99)
Glucose-Capillary: 154 mg/dL — ABNORMAL HIGH (ref 65–99)

## 2017-09-02 LAB — LACTIC ACID, PLASMA: Lactic Acid, Venous: 1.2 mmol/L (ref 0.5–1.9)

## 2017-09-02 LAB — COMPREHENSIVE METABOLIC PANEL
ALK PHOS: 54 U/L (ref 38–126)
ALT: 11 U/L — AB (ref 14–54)
AST: 15 U/L (ref 15–41)
Albumin: 4.4 g/dL (ref 3.5–5.0)
Anion gap: 12 (ref 5–15)
BILIRUBIN TOTAL: 0.9 mg/dL (ref 0.3–1.2)
BUN: 15 mg/dL (ref 6–20)
CALCIUM: 9.1 mg/dL (ref 8.9–10.3)
CO2: 23 mmol/L (ref 22–32)
CREATININE: 0.98 mg/dL (ref 0.44–1.00)
Chloride: 103 mmol/L (ref 101–111)
GFR calc non Af Amer: 57 mL/min — ABNORMAL LOW (ref >60)
Glucose, Bld: 162 mg/dL — ABNORMAL HIGH (ref 65–99)
Potassium: 3.9 mmol/L (ref 3.5–5.1)
Sodium: 138 mmol/L (ref 135–145)
TOTAL PROTEIN: 7.1 g/dL (ref 6.5–8.1)

## 2017-09-02 LAB — BLOOD GAS, VENOUS
Acid-Base Excess: 1.6 mmol/L (ref 0.0–2.0)
Bicarbonate: 25.8 mmol/L (ref 20.0–28.0)
O2 Saturation: 85.1 %
PH VEN: 7.44 — AB (ref 7.250–7.430)
Patient temperature: 37
pCO2, Ven: 38 mmHg — ABNORMAL LOW (ref 44.0–60.0)
pO2, Ven: 48 mmHg — ABNORMAL HIGH (ref 32.0–45.0)

## 2017-09-02 MED ORDER — ACETAMINOPHEN 325 MG PO TABS
650.0000 mg | ORAL_TABLET | Freq: Four times a day (QID) | ORAL | Status: DC | PRN
  Administered 2017-09-02 – 2017-09-04 (×4): 650 mg via ORAL
  Filled 2017-09-02 (×4): qty 2

## 2017-09-02 MED ORDER — MONTELUKAST SODIUM 10 MG PO TABS
10.0000 mg | ORAL_TABLET | Freq: Every day | ORAL | Status: DC
  Administered 2017-09-03 – 2017-09-04 (×2): 10 mg via ORAL
  Filled 2017-09-02 (×2): qty 1

## 2017-09-02 MED ORDER — METFORMIN HCL ER 500 MG PO TB24
1000.0000 mg | ORAL_TABLET | Freq: Every day | ORAL | Status: DC
  Administered 2017-09-03: 1000 mg via ORAL
  Filled 2017-09-02 (×2): qty 2

## 2017-09-02 MED ORDER — SENNOSIDES-DOCUSATE SODIUM 8.6-50 MG PO TABS: 1.0000 | ORAL_TABLET | Freq: Every evening | ORAL | Status: DC | PRN

## 2017-09-02 MED ORDER — ATORVASTATIN CALCIUM 20 MG PO TABS
40.0000 mg | ORAL_TABLET | Freq: Every day | ORAL | Status: DC
  Administered 2017-09-02 – 2017-09-03 (×2): 40 mg via ORAL
  Filled 2017-09-02 (×2): qty 2

## 2017-09-02 MED ORDER — SITAGLIP PHOS-METFORMIN HCL ER 100-1000 MG PO TB24: 1.0000 | ORAL_TABLET | Freq: Every day | ORAL | Status: DC

## 2017-09-02 MED ORDER — OMEGA-3-ACID ETHYL ESTERS 1 G PO CAPS
1.0000 g | ORAL_CAPSULE | Freq: Every day | ORAL | Status: DC
  Administered 2017-09-03 – 2017-09-04 (×2): 1 g via ORAL
  Filled 2017-09-02 (×2): qty 1

## 2017-09-02 MED ORDER — MOMETASONE FURO-FORMOTEROL FUM 200-5 MCG/ACT IN AERO
2.0000 | INHALATION_SPRAY | Freq: Two times a day (BID) | RESPIRATORY_TRACT | Status: DC
  Administered 2017-09-02 – 2017-09-04 (×4): 2 via RESPIRATORY_TRACT
  Filled 2017-09-02: qty 8.8

## 2017-09-02 MED ORDER — VITAMIN B-12 1000 MCG PO TABS
1000.0000 ug | ORAL_TABLET | Freq: Every day | ORAL | Status: DC
  Administered 2017-09-03 – 2017-09-04 (×2): 1000 ug via ORAL
  Filled 2017-09-02 (×2): qty 1

## 2017-09-02 MED ORDER — PRAMIPEXOLE DIHYDROCHLORIDE 0.25 MG PO TABS
0.2500 mg | ORAL_TABLET | Freq: Every day | ORAL | Status: DC
  Administered 2017-09-02 – 2017-09-03 (×2): 0.25 mg via ORAL
  Filled 2017-09-02 (×2): qty 1

## 2017-09-02 MED ORDER — LORAZEPAM 2 MG/ML IJ SOLN: 0.5000 mg | Freq: Once | INTRAMUSCULAR | Status: DC

## 2017-09-02 MED ORDER — INSULIN ASPART 100 UNIT/ML ~~LOC~~ SOLN: 0.0000 [IU] | Freq: Every day | SUBCUTANEOUS | Status: DC

## 2017-09-02 MED ORDER — TRAZODONE HCL 50 MG PO TABS
150.0000 mg | ORAL_TABLET | Freq: Every day | ORAL | Status: DC
  Administered 2017-09-02 – 2017-09-03 (×2): 150 mg via ORAL
  Filled 2017-09-02 (×2): qty 3

## 2017-09-02 MED ORDER — SODIUM CHLORIDE 0.9 % IV SOLN
Freq: Once | INTRAVENOUS | Status: AC
  Administered 2017-09-02: 10:00:00 via INTRAVENOUS

## 2017-09-02 MED ORDER — IPRATROPIUM-ALBUTEROL 0.5-2.5 (3) MG/3ML IN SOLN
3.0000 mL | RESPIRATORY_TRACT | Status: DC | PRN
  Administered 2017-09-02: 3 mL via RESPIRATORY_TRACT
  Filled 2017-09-02: qty 3

## 2017-09-02 MED ORDER — ASPIRIN EC 81 MG PO TBEC
162.0000 mg | DELAYED_RELEASE_TABLET | Freq: Every day | ORAL | Status: DC
  Administered 2017-09-03 – 2017-09-04 (×2): 162 mg via ORAL
  Filled 2017-09-02 (×2): qty 2

## 2017-09-02 MED ORDER — METOCLOPRAMIDE HCL 10 MG PO TABS
5.0000 mg | ORAL_TABLET | Freq: Three times a day (TID) | ORAL | Status: DC
  Administered 2017-09-02 – 2017-09-04 (×8): 5 mg via ORAL
  Filled 2017-09-02: qty 1
  Filled 2017-09-02 (×2): qty 0.5
  Filled 2017-09-02: qty 1
  Filled 2017-09-02: qty 0.5
  Filled 2017-09-02 (×2): qty 1
  Filled 2017-09-02: qty 0.5
  Filled 2017-09-02: qty 1
  Filled 2017-09-02 (×5): qty 0.5
  Filled 2017-09-02: qty 1
  Filled 2017-09-02: qty 0.5

## 2017-09-02 MED ORDER — CLONAZEPAM 0.5 MG PO TABS
1.0000 mg | ORAL_TABLET | Freq: Every day | ORAL | Status: DC
  Administered 2017-09-02 – 2017-09-04 (×3): 1 mg via ORAL
  Filled 2017-09-02 (×3): qty 2

## 2017-09-02 MED ORDER — ONDANSETRON HCL 4 MG PO TABS: 4.0000 mg | ORAL_TABLET | Freq: Four times a day (QID) | ORAL | Status: DC | PRN

## 2017-09-02 MED ORDER — FENOFIBRATE 160 MG PO TABS
160.0000 mg | ORAL_TABLET | Freq: Every day | ORAL | Status: DC
  Administered 2017-09-03 – 2017-09-04 (×2): 160 mg via ORAL
  Filled 2017-09-02 (×2): qty 1

## 2017-09-02 MED ORDER — ONDANSETRON HCL 4 MG/2ML IJ SOLN
4.0000 mg | Freq: Four times a day (QID) | INTRAMUSCULAR | Status: DC | PRN
  Administered 2017-09-02: 18:00:00 4 mg via INTRAVENOUS
  Filled 2017-09-02: qty 2

## 2017-09-02 MED ORDER — HYDROMORPHONE HCL 1 MG/ML IJ SOLN
0.5000 mg | Freq: Once | INTRAMUSCULAR | Status: AC
  Administered 2017-09-02: 0.5 mg via INTRAVENOUS
  Filled 2017-09-02: qty 1

## 2017-09-02 MED ORDER — LEVOTHYROXINE SODIUM 112 MCG PO TABS
112.0000 ug | ORAL_TABLET | Freq: Every day | ORAL | Status: DC
  Administered 2017-09-03 – 2017-09-04 (×2): 112 ug via ORAL
  Filled 2017-09-02 (×2): qty 1

## 2017-09-02 MED ORDER — PIPERACILLIN-TAZOBACTAM 3.375 G IVPB 30 MIN
3.3750 g | Freq: Once | INTRAVENOUS | Status: AC
  Administered 2017-09-02: 3.375 g via INTRAVENOUS
  Filled 2017-09-02: qty 50

## 2017-09-02 MED ORDER — SODIUM CHLORIDE 0.9 % IV SOLN
1.0000 g | INTRAVENOUS | Status: DC
  Administered 2017-09-02 – 2017-09-03 (×2): 1 g via INTRAVENOUS
  Filled 2017-09-02 (×2): qty 1
  Filled 2017-09-02: qty 10

## 2017-09-02 MED ORDER — SODIUM CHLORIDE 0.9 % IV SOLN
INTRAVENOUS | Status: DC
  Administered 2017-09-02 – 2017-09-03 (×3): via INTRAVENOUS

## 2017-09-02 MED ORDER — OXYCODONE HCL 5 MG PO TABS
5.0000 mg | ORAL_TABLET | ORAL | Status: DC | PRN
Start: 2017-09-02 — End: 2017-09-04
  Administered 2017-09-02 – 2017-09-03 (×4): 5 mg via ORAL
  Filled 2017-09-02 (×4): qty 1

## 2017-09-02 MED ORDER — LINAGLIPTIN 5 MG PO TABS
5.0000 mg | ORAL_TABLET | Freq: Every day | ORAL | Status: DC
  Filled 2017-09-02: qty 1

## 2017-09-02 MED ORDER — VANCOMYCIN HCL IN DEXTROSE 1-5 GM/200ML-% IV SOLN
1000.0000 mg | Freq: Once | INTRAVENOUS | Status: AC
  Administered 2017-09-02: 1000 mg via INTRAVENOUS
  Filled 2017-09-02: qty 200

## 2017-09-02 MED ORDER — LORAZEPAM 2 MG/ML IJ SOLN
1.0000 mg | Freq: Once | INTRAMUSCULAR | Status: AC
  Administered 2017-09-02: 1 mg via INTRAVENOUS
  Filled 2017-09-02: qty 1

## 2017-09-02 MED ORDER — ENOXAPARIN SODIUM 40 MG/0.4ML ~~LOC~~ SOLN
40.0000 mg | SUBCUTANEOUS | Status: DC
  Administered 2017-09-02 – 2017-09-03 (×2): 40 mg via SUBCUTANEOUS
  Filled 2017-09-02 (×2): qty 0.4

## 2017-09-02 MED ORDER — TIZANIDINE HCL 2 MG PO TABS
2.0000 mg | ORAL_TABLET | Freq: Four times a day (QID) | ORAL | Status: DC | PRN
  Filled 2017-09-02: qty 1

## 2017-09-02 MED ORDER — FERROUS SULFATE 325 (65 FE) MG PO TABS
325.0000 mg | ORAL_TABLET | Freq: Every day | ORAL | Status: DC
  Administered 2017-09-03 – 2017-09-04 (×2): 325 mg via ORAL
  Filled 2017-09-02 (×2): qty 1

## 2017-09-02 MED ORDER — INSULIN ASPART 100 UNIT/ML ~~LOC~~ SOLN
0.0000 [IU] | Freq: Three times a day (TID) | SUBCUTANEOUS | Status: DC
  Administered 2017-09-03: 12:00:00 2 [IU] via SUBCUTANEOUS
  Administered 2017-09-03: 1 [IU] via SUBCUTANEOUS
  Filled 2017-09-02 (×2): qty 1

## 2017-09-02 MED ORDER — ACETAMINOPHEN 650 MG RE SUPP: 650.0000 mg | Freq: Four times a day (QID) | RECTAL | Status: DC | PRN

## 2017-09-02 MED ORDER — ONDANSETRON HCL 4 MG/2ML IJ SOLN
4.0000 mg | Freq: Once | INTRAMUSCULAR | Status: AC
  Administered 2017-09-02: 4 mg via INTRAVENOUS
  Filled 2017-09-02: qty 2

## 2017-09-02 NOTE — ED Notes (Signed)
Per husband, pt took morning meds.

## 2017-09-02 NOTE — ED Notes (Signed)
Pt assisted to restroom and back to stretcher. Pt reports lower back pain. VSS. Pt updated with plan of care. Call bell within reach, will continue to monitor.

## 2017-09-02 NOTE — ED Provider Notes (Signed)
[facility] Emergency Department Provider Note       Time seen: ----------------------------------------- 9:45 AM on 09/02/2017 -----------------------------------------   I have reviewed the triage vital signs and the nursing notes.  HISTORY   Chief Complaint Dysuria; Shortness of Breath; and Nausea    HPI Kristin [name] is a 72 y.o. female with a history of asthma, COPD, diabetes, emphysema, hypertension who presents to the ED for UTI symptoms for the past week.  She is also complaining of nausea and suprapubic discomfort.  Patient states it feels like razor blades in her pelvic area when she is urinating.  She did take her morning meds today, she has some back pain and weakness as well.  She has been seen in the past for similar with a diagnosis of sepsis.  She is having trouble breathing as well she states.  Past Medical History:  Diagnosis Date  . Asthma   . 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 COPD (chronic obstructive pulmonary disease) ([place])   . Diabetes mellitus without complication ([place])   . Emphysema lung ([place])   . Hypertension     Patient Active Problem List   Diagnosis Date Noted  . UTI (urinary tract infection) 11/30/2016  . Encephalopathy acute 11/30/2016  . Chronic respiratory failure with hypoxia ([place]) 11/30/2016  . Weakness generalized 11/30/2016  . Acute encephalopathy 11/30/2016  . Acute respiratory failure ([place]) 05/23/2016  . Malnutrition of moderate degree 05/23/2016  . Syncope 10/23/2015    History reviewed. No pertinent surgical history.  Allergies Codeine and Morphine and related  Social History Social History   Tobacco Use  . Smoking status: Former Research scientist (life sciences)  . Smokeless tobacco: Never Used  Substance Use Topics  . Alcohol use: No  . Drug use: No   Review of Systems Constitutional: Negative for fever. Cardiovascular: Negative for chest pain. Respiratory: Positive for shortness of  breath Gastrointestinal: Negative for abdominal pain, vomiting and diarrhea. Genitourinary: Positive for dysuria Musculoskeletal: Negative for back pain. Skin: Negative for rash. Neurological: Positive for weakness  All systems negative/normal/unremarkable except as stated in the HPI  ____________________________________________   PHYSICAL EXAM:  VITAL SIGNS: ED Triage Vitals [09/02/17 0925]  Enc Vitals Group     BP 107/62     Pulse Rate 94     Resp (!) 28     Temp 99 F (37.2 C)     Temp Source Oral     SpO2 100 %     Weight 120 lb (54.4 kg)     Height 5\' 3"  (1.6 m)     Head Circumference      Peak Flow      Pain Score 9     Pain Loc      Pain Edu?      Excl. in [place]?    Constitutional: Alert and oriented.  Mild to moderate distress Eyes: Conjunctivae are normal. Normal extraocular movements. ENT   Head: Normocephalic and atraumatic.   Nose: No congestion/rhinnorhea.   Mouth/Throat: Mucous membranes are moist.   Neck: No stridor. Cardiovascular: Normal rate, regular rhythm. No murmurs, rubs, or gallops. Respiratory: Tachypnea with clear breath sounds bilaterally Gastrointestinal: Soft and nontender. Normal bowel sounds Musculoskeletal: Nontender with normal range of motion in extremities. No lower extremity tenderness nor edema. Neurologic:  Normal speech and language. No gross focal neurologic deficits are appreciated.  Skin:  Skin is warm, dry and intact. No rash noted. Psychiatric: Depressed mood and affect ____________________________________________  EKG:  Interpreted by me.  Sinus rhythm rate 97 bpm, normal PR interval, normal QRS size, normal QT, there is some nonspecific ST segment changes, no ST elevation is noted  ____________________________________________  ED COURSE:  As part of my medical decision making, I reviewed the following data within the [place] History obtained from family if available, nursing notes, old  chart and ekg, as well as notes from prior ED visits. Patient presented for nausea, shortness of breath, dysuria and weakness, we will assess with labs and imaging as indicated at this time.   Procedures ____________________________________________   LABS (pertinent positives/negatives)  Labs Reviewed  COMPREHENSIVE METABOLIC PANEL - Abnormal; Notable for the following components:      Result Value   Glucose, Bld 162 (*)    ALT 11 (*)    GFR calc non Af Amer 57 (*)    All other components within normal limits  CBC WITH DIFFERENTIAL/PLATELET - Abnormal; Notable for the following components:   WBC 15.3 (*)    RBC 3.54 (*)    Hemoglobin 11.4 (*)    HCT 34.3 (*)    Neutro Abs 13.7 (*)    Lymphs Abs 0.7 (*)    All other components within normal limits  BLOOD GAS, VENOUS - Abnormal; Notable for the following components:   pH, Ven 7.44 (*)    pCO2, Ven 38 (*)    pO2, Ven 48.0 (*)    All other components within normal limits  URINALYSIS, COMPLETE (UACMP) WITH MICROSCOPIC - Abnormal; Notable for the following components:   Color, Urine AMBER (*)    APPearance CLOUDY (*)    Hgb urine dipstick SMALL (*)    Protein, ur 100 (*)    Nitrite POSITIVE (*)    Leukocytes, UA MODERATE (*)    WBC, UA >50 (*)    All other components within normal limits  CULTURE, BLOOD (ROUTINE X 2)  CULTURE, BLOOD (ROUTINE X 2)  URINE CULTURE  LACTIC ACID, PLASMA  TROPONIN I    RADIOLOGY  Chest x-ray IMPRESSION: COPD.  No active disease. IMPRESSION: 1. Mild circumferential bladder wall thickening with associated mild perivesical inflammatory changes, favoring cystitis. Correlate with urinalysis. 2. Hepatic steatosis. 3.  Aortic atherosclerosis (ICD10-I70.0).   ____________________________________________  DIFFERENTIAL DIAGNOSIS   Sepsis, UTI, pyelonephritis, dehydration, electrolyte abnormality  FINAL ASSESSMENT AND PLAN  Cystitis   Plan: The patient had presented for dysuria as  well as nausea, weakness and shortness of breath. Patient's labs revealed leukocytosis and infected urine. Patient's imaging did not reveal any acute process other than a degree of cystitis evident on CT.  No active kidney stones.  I did start broad-spectrum antibiotics for UTI as I was concerned about urosepsis.  I will discuss with hospitalist for admission.   Laurence Aly, MD   Note: This note was generated in part or whole with voice recognition software. Voice recognition is usually quite accurate but there are transcription errors that can and very often do occur. I apologize for any typographical errors that were not detected and corrected.     Earleen Newport, MD 09/02/17 1106

## 2017-09-02 NOTE — ED Triage Notes (Signed)
Pt arrived via POV with reports of UTI sxs x 1 week.  Pt c/o nausea and suprapubic pain. Pt states she has been incontinent more frequently as well.  Pt has hx of COPD and is tachypneic. Wears 4L [place] continuously.

## 2017-09-02 NOTE — H&P (Signed)
[facility] [place] NAME: Kristin [name]    MR#:  [id number]  DATE OF BIRTH:  11-15-1945  DATE OF ADMISSION:  09/02/2017  PRIMARY CARE PHYSICIAN: Katheren Shams   REQUESTING/REFERRING PHYSICIAN:   CHIEF COMPLAINT:   Chief Complaint  Patient presents with  . Dysuria  . Shortness of Breath  . Nausea    HISTORY OF PRESENT ILLNESS: Kristin [name]  is a 72 y.o. female with a known history of left ear cancer, COPD on home oxygen, diabetes mellitus type 2, hypertension, hyperlipidemia presented to the emergency room with nausea and vomiting.  She also has lower abdominal discomfort which is aching in nature 6 out of 10 on a scale of 1-10.  Patient complains of dysuria and her urinalysis showed infection in the emergency room.  Patient was worked up with CT kidney stone protocol which did not show any hydronephrosis or any renal stone.  Patient was given IV vancomycin and Zosyn antibiotics in the emergency room.  Lactic acid level was normal.  No complaints of any chest pain.  Hospitalist service was consulted for further care.  PAST MEDICAL HISTORY:   Past Medical History:  Diagnosis Date  . Asthma   . Cancer [facility]) 2010   Left ear cancer  . COPD (chronic obstructive pulmonary disease) ([place])   . Diabetes mellitus without complication ([place])   . Emphysema lung ([place])   . Hypertension     PAST SURGICAL HISTORY: History reviewed. No pertinent surgical history.  SOCIAL HISTORY:  Social History   Tobacco Use  . Smoking status: Former Research scientist (life sciences)  . Smokeless tobacco: Never Used  Substance Use Topics  . Alcohol use: No    FAMILY HISTORY:  Family History  Problem Relation Age of Onset  . Ovarian cancer Mother   . Lung cancer Father     DRUG ALLERGIES:  Allergies  Allergen Reactions  . Codeine Nausea Only  . Morphine And Related Nausea Only    REVIEW OF SYSTEMS:   CONSTITUTIONAL: No fever,has  fatigue and weakness.   EYES: No blurred or double vision.  EARS, NOSE, AND THROAT: No tinnitus or ear pain.  RESPIRATORY: No cough, shortness of breath, wheezing or hemoptysis.  CARDIOVASCULAR: No chest pain, orthopnea, edema.  GASTROINTESTINAL: Has nausea, vomiting, diarrhea  Has abdominal pain.  GENITOURINARY: Has dysuria,  No hematuria.  ENDOCRINE: No polyuria, nocturia,  HEMATOLOGY: No anemia, easy bruising or bleeding SKIN: No rash or lesion. MUSCULOSKELETAL: No joint pain or arthritis.   NEUROLOGIC: No tingling, numbness, weakness.  PSYCHIATRY: No anxiety or depression.   MEDICATIONS AT HOME:  Prior to Admission medications   Medication Sig Start Date End Date Taking? Authorizing Provider  montelukast (SINGULAIR) 10 MG tablet Take 10 mg by mouth daily. 06/06/17  Yes [provider]  tiZANidine (ZANAFLEX) 2 MG tablet Take 2 mg by mouth 3 (three) times daily as needed. for muscle spams 08/14/17  Yes [provider]  traZODone (DESYREL) 150 MG tablet Take 150 mg by mouth at bedtime. 08/14/17  Yes [provider]  aspirin EC 81 MG EC tablet Take 1 tablet (81 mg total) by mouth daily. Patient taking differently: Take 162 mg by mouth daily.  10/24/15   Loletha Grayer, MD  atorvastatin (LIPITOR) 40 MG tablet Take 1 tablet by mouth daily at 6 PM. 10/12/15   [provider]  clonazePAM (KLONOPIN) 1 MG tablet Take 1 tablet by mouth daily. 10/01/15   [provider]  CRANBERRY EXTRACT PO Take 4,200 mg by mouth daily.    [provider]  fenofibrate 160 MG tablet Take 1 tablet by mouth daily. 10/06/15   [provider]  ferrous sulfate 325 (65 FE) MG tablet Take 325 mg by mouth daily.    [provider]  Fluticasone-Salmeterol (ADVAIR DISKUS) 250-50 MCG/DOSE AEPB Inhale 1 puff into the lungs 2 (two) times daily. 05/26/16   Gladstone Lighter, MD  furosemide (LASIX) 40 MG tablet Take 40 mg by mouth daily. 08/03/15   [provider]   ipratropium-albuterol (DUONEB) 0.5-2.5 (3) MG/3ML SOLN Take 3 mLs by nebulization every 4 (four) hours as needed. 05/26/16   Gladstone Lighter, MD  JANUMET XR [id number] MG TB24 Take 1 tablet by mouth daily. 10/06/15   [provider]  ketorolac (TORADOL) 10 MG tablet Take 1 tablet (10 mg total) by mouth every 6 (six) hours as needed. 05/26/16   Gladstone Lighter, MD  levofloxacin (LEVAQUIN) 500 MG tablet Take 1 tablet (500 mg total) by mouth daily. X 5 more days 05/26/16   Gladstone Lighter, MD  levothyroxine (SYNTHROID, LEVOTHROID) 112 MCG tablet Take 1 tablet by mouth daily. 09/15/15   [provider]  metoCLOPramide (REGLAN) 5 MG tablet Take 1 tablet (5 mg total) by mouth 4 (four) times daily -  before meals and at bedtime. 05/26/16   Gladstone Lighter, MD  Omega-3 Fatty Acids (FISH OIL) 1000 MG CAPS Take 1 capsule by mouth daily.    [provider]  pantoprazole (PROTONIX) 40 MG tablet Take 1 tablet by mouth 2 (two) times daily with breakfast and lunch. 10/20/15   [provider]  Potassium 99 MG TABS Take 198 mg by mouth daily.    [provider]  pramipexole (MIRAPEX) 0.25 MG tablet Take 0.25 mg by mouth at bedtime. 05/04/16   [provider]  predniSONE (STERAPRED UNI-PAK 21 TAB) 10 MG (21) TBPK tablet 6 tabs PO x 1 day 5 tabs PO x 1 day 4 tabs PO x 1 day 3 tabs PO x 1 day 2 tabs PO x 1 day 1 tab PO x 1 day and stop 05/26/16   Gladstone Lighter, MD  promethazine (PHENERGAN) 25 MG tablet Take 25 mg by mouth every 6 (six) hours as needed.  09/14/15   [provider]  SPIRIVA HANDIHALER 18 MCG inhalation capsule Place 18 mcg into inhaler and inhale daily. 05/13/16   [provider]  VENTOLIN HFA 108 (90 Base) MCG/ACT inhaler Inhale 2 puffs into the lungs every 6 (six) hours as needed.  10/11/15   [provider]  vitamin B-12 (CYANOCOBALAMIN) 1000 MCG tablet Take 1,000 mcg by mouth daily.    [provider]       PHYSICAL EXAMINATION:   VITAL SIGNS: Blood pressure 119/78, pulse 88, temperature 99 F (37.2 C), temperature source Oral, resp. rate (!) 22, height 5\' 3"  (1.6 m), weight 54.4 kg (120 lb), SpO2 95 %.  GENERAL:  72 y.o.-year-old patient lying in the bed with no acute distress.  EYES: Pupils equal, round, reactive to light and accommodation. No scleral icterus. Extraocular muscles intact.  HEENT: Head atraumatic, normocephalic. Oropharynx dry and nasopharynx clear.  NECK:  Supple, no jugular venous distention. No thyroid enlargement, no tenderness.  LUNGS: Normal breath sounds bilaterally, no wheezing, rales,rhonchi or crepitation. No use of accessory muscles of respiration.  CARDIOVASCULAR: S1, S2 normal. No murmurs, rubs, or gallops.  ABDOMEN: Soft, tenderness below umbilicus, nondistended. Bowel sounds present. No organomegaly  or mass.  EXTREMITIES: No pedal edema, cyanosis, or clubbing.  NEUROLOGIC: Cranial nerves II through XII are intact. Muscle strength 5/5 in all extremities. Sensation intact. Gait not checked.  PSYCHIATRIC: The patient is alert and oriented x 3.  SKIN: No obvious rash, lesion, or ulcer.   LABORATORY PANEL:   CBC Recent Labs  Lab 09/02/17 0939  WBC 15.3*  HGB 11.4*  HCT 34.3*  PLT 249  MCV 96.9  MCH 32.3  MCHC 33.3  RDW 13.2  LYMPHSABS 0.7*  MONOABS 0.8  EOSABS 0.1  BASOSABS 0.0   ------------------------------------------------------------------------------------------------------------------  Chemistries  Recent Labs  Lab 09/02/17 0939  NA 138  K 3.9  CL 103  CO2 23  GLUCOSE 162*  BUN 15  CREATININE 0.98  CALCIUM 9.1  AST 15  ALT 11*  ALKPHOS 54  BILITOT 0.9   ------------------------------------------------------------------------------------------------------------------ estimated creatinine clearance is 43.6 mL/min (by C-G formula based on SCr of 0.98  mg/dL). ------------------------------------------------------------------------------------------------------------------ No results for input(s): TSH, T4TOTAL, T3FREE, THYROIDAB in the last 72 hours.  Invalid input(s): FREET3   Coagulation profile No results for input(s): INR, PROTIME in the last 168 hours. ------------------------------------------------------------------------------------------------------------------- No results for input(s): DDIMER in the last 72 hours. -------------------------------------------------------------------------------------------------------------------  Cardiac Enzymes Recent Labs  Lab 09/02/17 0939  TROPONINI <0.03   ------------------------------------------------------------------------------------------------------------------ Invalid input(s): POCBNP  ---------------------------------------------------------------------------------------------------------------  Urinalysis    Component Value Date/Time   COLORURINE AMBER (A) 09/02/2017 0939   APPEARANCEUR CLOUDY (A) 09/02/2017 0939   APPEARANCEUR Clear 03/18/2013 1319   LABSPEC 1.014 09/02/2017 0939   LABSPEC 1.005 03/18/2013 1319   PHURINE 6.0 09/02/2017 0939   GLUCOSEU NEGATIVE 09/02/2017 0939   GLUCOSEU Negative 03/18/2013 1319   HGBUR SMALL (A) 09/02/2017 0939   BILIRUBINUR NEGATIVE 09/02/2017 0939   BILIRUBINUR Negative 03/18/2013 1319   KETONESUR NEGATIVE 09/02/2017 0939   PROTEINUR 100 (A) 09/02/2017 0939   UROBILINOGEN 1.0 06/24/2008 1545   NITRITE POSITIVE (A) 09/02/2017 0939   LEUKOCYTESUR MODERATE (A) 09/02/2017 0939   LEUKOCYTESUR 1+ 03/18/2013 1319     RADIOLOGY: Dg Chest Port 1 View  Result Date: 09/02/2017 CLINICAL DATA:  The vert.  Shortness of breath.  History of COPD. EXAM: PORTABLE CHEST 1 VIEW COMPARISON:  CT chest and chest x-ray dated June 24, 2017. FINDINGS: The heart size and mediastinal contours are within normal limits. Normal pulmonary vascularity.  Atherosclerotic calcification of the aortic arch. The lungs remain hyperinflated with emphysematous changes. Unchanged bibasilar scarring. No focal consolidation, pleural effusion, or pneumothorax. No acute osseous abnormality. IMPRESSION: COPD.  No active disease. Electronically Signed   By: Titus Dubin M.D.   On: 09/02/2017 10:09   Ct Renal Stone Study  Result Date: 09/02/2017 CLINICAL DATA:  Nausea and suprapubic pain for the past week. EXAM: CT ABDOMEN AND PELVIS WITHOUT CONTRAST TECHNIQUE: Multidetector CT imaging of the abdomen and pelvis was performed following the standard protocol without IV contrast. COMPARISON:  CT abdomen pelvis dated Aug 14, 2017. FINDINGS: Lower chest: Unchanged left basilar scarring.  No acute abnormality. Hepatobiliary: Hepatic steatosis. No focal liver abnormality. Status post cholecystectomy. No biliary dilatation. Pancreas: Unremarkable. No pancreatic ductal dilatation or surrounding inflammatory changes. Spleen: Normal in size without focal abnormality. Adrenals/Urinary Tract: Adrenal glands are unremarkable. Kidneys are normal, without renal calculi, focal lesion, or hydronephrosis. Mild circumferential bladder wall thickening with perivesical inflammatory stranding. Stomach/Bowel: Stomach is within normal limits. Appendix appears normal. No evidence of bowel wall thickening, distention, or inflammatory changes. There are a few sigmoid colonic diverticula. Vascular/Lymphatic: Aortic atherosclerosis.  No enlarged abdominal or pelvic lymph nodes. Reproductive: Status post hysterectomy. No adnexal masses. Other: No abdominal wall hernia or abnormality. No abdominopelvic ascites. No pneumoperitoneum. Musculoskeletal: No acute or significant osseous findings. Osteopenia. IMPRESSION: 1. Mild circumferential bladder wall thickening with associated mild perivesical inflammatory changes, favoring cystitis. Correlate with urinalysis. 2. Hepatic steatosis. 3.  Aortic atherosclerosis  (ICD10-I70.0). Electronically Signed   By: Titus Dubin M.D.   On: 09/02/2017 10:59    EKG: Orders placed or performed during the hospital encounter of 09/02/17  . EKG 12-Lead  . EKG 12-Lead    IMPRESSION AND PLAN:  72 year old female patient is with history of COPD on home oxygen, diabetes mellitus type 2, hypertension, hyperlipidemia presented to the emergency room for nausea vomiting and dysuria.  -Acute pyelonephritis Start patient on IV Rocephin antibiotic and follow-up urine cultures IV fluid hydration  -Emphysema continue oxygen via nasal cannula Nebulization treatments and home dose inhaler  -Hyperlipidemia Continue statin medication  -Nausea and vomiting Antiemetic medications  -DVT prophylaxis subcu Lovenox daily  All the records are reviewed and case discussed with ED provider. Management plans discussed with the patient, family and they are in agreement.  CODE STATUS:Full code Code Status History    Date Active Date Inactive Code Status Order ID Comments User Context   11/30/2016 1912 12/01/2016 1622 Full Code [id number]  Idelle Crouch, MD Inpatient   10/23/2015 0050 10/23/2015 1504 Full Code [id number]  Hugelmeyer, Ubaldo Glassing, DO ED       TOTAL TIME TAKING CARE OF THIS PATIENT: 53 minutes.    Saundra Shelling M.D on 09/02/2017 at 11:46 AM  Between 7am to 6pm - Pager - [phone]  After 6pm go to [email or web address] - password [email or web address] [facility]  [place] [facility]  Office  [phone]  CC: Primary care physician; Katheren Shams

## 2017-09-02 NOTE — Progress Notes (Signed)
CODE SEPSIS - PHARMACY COMMUNICATION  **Broad Spectrum Antibiotics should be administered within 1 hour of Sepsis diagnosis**  Time Code Sepsis Called/Page Received: 0935  Antibiotics Ordered: Zosyn/Vanco  Time of 1st antibiotic administration: Zosyn [phone]  Additional action taken by pharmacy: N/A  If necessary, Name of Provider/Nurse Contacted: N/A   Janyiah Silveri L ,PharmD Clinical Pharmacist  09/02/2017  9:56 AM

## 2017-09-02 NOTE — ED Notes (Signed)
Report to Nicole, RN

## 2017-09-02 NOTE — ED Notes (Signed)
First Nurse Note:  Patient helped to [facility] from POV with complaint of UTI, hx of sepsis.  Patient on 4L of oxygen.  Placed on hospital tank.  Alert and oriented.  To Room 18.

## 2017-09-02 NOTE — ED Notes (Signed)
Report called to Maddie, RN on floor and pt to be transported to room. Pt updated with plan of care. VSS.

## 2017-09-02 NOTE — Progress Notes (Signed)
Advanced care plan. Purpose of the Encounter: CODE STATUS Parties in Attendance: Patient Patient's Decision Capacity: Good Subjective/Patient's story: Presented to emergency room with fever, nausea vomiting and lower abdominal pain Objective/Medical story Has pyelonephritis Needs IV antibiotics Goals of care determination:  Advance care directives and goals of care discussed with the patient in detail Patient wants everything done for now which includes cardiac resuscitation, intubation and ventilator if the need arises CODE STATUS: Full code Time spent discussing advanced care planning: 16 minutes

## 2017-09-03 LAB — CBC
HEMATOCRIT: 27.8 % — AB (ref 35.0–47.0)
HEMOGLOBIN: 9.3 g/dL — AB (ref 12.0–16.0)
MCH: 32.9 pg (ref 26.0–34.0)
MCHC: 33.5 g/dL (ref 32.0–36.0)
MCV: 98.1 fL (ref 80.0–100.0)
Platelets: 203 10*3/uL (ref 150–440)
RBC: 2.83 MIL/uL — AB (ref 3.80–5.20)
RDW: 13.2 % (ref 11.5–14.5)
WBC: 8.4 10*3/uL (ref 3.6–11.0)

## 2017-09-03 LAB — BASIC METABOLIC PANEL
ANION GAP: 7 (ref 5–15)
BUN: 11 mg/dL (ref 6–20)
CO2: 25 mmol/L (ref 22–32)
CREATININE: 1.04 mg/dL — AB (ref 0.44–1.00)
Calcium: 8.6 mg/dL — ABNORMAL LOW (ref 8.9–10.3)
Chloride: 108 mmol/L (ref 101–111)
GFR calc non Af Amer: 53 mL/min — ABNORMAL LOW (ref >60)
Glucose, Bld: 137 mg/dL — ABNORMAL HIGH (ref 65–99)
Potassium: 3.9 mmol/L (ref 3.5–5.1)
Sodium: 140 mmol/L (ref 135–145)

## 2017-09-03 LAB — GLUCOSE, CAPILLARY
GLUCOSE-CAPILLARY: 120 mg/dL — AB (ref 65–99)
GLUCOSE-CAPILLARY: 120 mg/dL — AB (ref 65–99)
GLUCOSE-CAPILLARY: 163 mg/dL — AB (ref 65–99)
Glucose-Capillary: 133 mg/dL — ABNORMAL HIGH (ref 65–99)
Glucose-Capillary: 167 mg/dL — ABNORMAL HIGH (ref 65–99)

## 2017-09-03 MED ORDER — ENSURE ENLIVE PO LIQD: 237.0000 mL | Freq: Two times a day (BID) | ORAL | Status: DC

## 2017-09-03 MED ORDER — PHENAZOPYRIDINE HCL 100 MG PO TABS
100.0000 mg | ORAL_TABLET | Freq: Three times a day (TID) | ORAL | Status: DC
  Administered 2017-09-03 – 2017-09-04 (×4): 100 mg via ORAL
  Filled 2017-09-03 (×5): qty 1

## 2017-09-03 MED ORDER — METFORMIN HCL 500 MG PO TABS: 500.0000 mg | ORAL_TABLET | Freq: Every day | ORAL | Status: DC

## 2017-09-03 MED ORDER — LORATADINE 10 MG PO TABS
10.0000 mg | ORAL_TABLET | Freq: Every day | ORAL | Status: DC
  Administered 2017-09-03 – 2017-09-04 (×2): 10 mg via ORAL
  Filled 2017-09-03 (×2): qty 1

## 2017-09-03 MED ORDER — GLUCERNA SHAKE PO LIQD
237.0000 mL | Freq: Two times a day (BID) | ORAL | Status: DC
  Administered 2017-09-03: 15:00:00 237 mL via ORAL

## 2017-09-03 NOTE — Progress Notes (Signed)
[facility] NAME: Kristin [name]    MR#:  [id number]  DATE OF BIRTH:  01/08/1946  SUBJECTIVE:  CHIEF COMPLAINT:  Pt is reporting burning micturition, headache which is not getting better with tylenol  REVIEW OF SYSTEMS:  CONSTITUTIONAL: No fever, fatigue or weakness.  EYES: No blurred or double vision.  EARS, NOSE, AND THROAT: No tinnitus or ear pain.  RESPIRATORY: No cough, shortness of breath, wheezing or hemoptysis.  CARDIOVASCULAR: No chest pain, orthopnea, edema.  GASTROINTESTINAL: No nausea, vomiting, diarrhea or abdominal pain.  GENITOURINARY: No dysuria, hematuria.  ENDOCRINE: No polyuria, nocturia,  HEMATOLOGY: No anemia, easy bruising or bleeding SKIN: No rash or lesion. MUSCULOSKELETAL: No joint pain or arthritis.   NEUROLOGIC: No tingling,  reports Little finger numbness,  No weakness.  PSYCHIATRY: No anxiety or depression.   DRUG ALLERGIES:   Allergies  Allergen Reactions  . Codeine Nausea Only  . Morphine And Related Nausea Only    VITALS:  Blood pressure (!) 108/32, pulse 72, temperature 99.1 F (37.3 C), temperature source Oral, resp. rate 18, height 5\' 3"  (1.6 m), weight 54.4 kg (120 lb), SpO2 97 %.  PHYSICAL EXAMINATION:  GENERAL:  72 y.o.-year-old patient lying in the bed with no acute distress.  EYES: Pupils equal, round, reactive to light and accommodation. No scleral icterus. Extraocular muscles intact.  HEENT: Head atraumatic, normocephalic. Oropharynx and nasopharynx clear.  NECK:  Supple, no jugular venous distention. No thyroid enlargement, no tenderness.  LUNGS: Normal breath sounds bilaterally, no wheezing, rales,rhonchi or crepitation. No use of accessory muscles of respiration.  CARDIOVASCULAR: S1, S2 normal. No murmurs, rubs, or gallops.  ABDOMEN: Soft, nontender, nondistended. Bowel sounds present. No organomegaly or mass.  EXTREMITIES: No pedal edema, cyanosis, or clubbing.   NEUROLOGIC: Cranial nerves II through XII are intact. Muscle strength 5/5 in all extremities. Sensation intact. Gait not checked.  PSYCHIATRIC: The patient is alert and oriented x 3.  SKIN: No obvious rash, lesion, or ulcer.    LABORATORY PANEL:   CBC Recent Labs  Lab 09/03/17 0403  WBC 8.4  HGB 9.3*  HCT 27.8*  PLT 203   ------------------------------------------------------------------------------------------------------------------  Chemistries  Recent Labs  Lab 09/02/17 0939 09/03/17 0403  NA 138 140  K 3.9 3.9  CL 103 108  CO2 23 25  GLUCOSE 162* 137*  BUN 15 11  CREATININE 0.98 1.04*  CALCIUM 9.1 8.6*  AST 15  --   ALT 11*  --   ALKPHOS 54  --   BILITOT 0.9  --    ------------------------------------------------------------------------------------------------------------------  Cardiac Enzymes Recent Labs  Lab 09/02/17 0939  TROPONINI <0.03   ------------------------------------------------------------------------------------------------------------------  RADIOLOGY:  Dg Chest Port 1 View  Result Date: 09/02/2017 CLINICAL DATA:  The vert.  Shortness of breath.  History of COPD. EXAM: PORTABLE CHEST 1 VIEW COMPARISON:  CT chest and chest x-ray dated June 24, 2017. FINDINGS: The heart size and mediastinal contours are within normal limits. Normal pulmonary vascularity. Atherosclerotic calcification of the aortic arch. The lungs remain hyperinflated with emphysematous changes. Unchanged bibasilar scarring. No focal consolidation, pleural effusion, or pneumothorax. No acute osseous abnormality. IMPRESSION: COPD.  No active disease. Electronically Signed   By: Titus Dubin M.D.   On: 09/02/2017 10:09   Ct Renal Stone Study  Result Date: 09/02/2017 CLINICAL DATA:  Nausea and suprapubic pain for the past week. EXAM: CT ABDOMEN AND PELVIS WITHOUT CONTRAST TECHNIQUE: Multidetector CT imaging of the abdomen and pelvis  was performed following the standard  protocol without IV contrast. COMPARISON:  CT abdomen pelvis dated Aug 14, 2017. FINDINGS: Lower chest: Unchanged left basilar scarring.  No acute abnormality. Hepatobiliary: Hepatic steatosis. No focal liver abnormality. Status post cholecystectomy. No biliary dilatation. Pancreas: Unremarkable. No pancreatic ductal dilatation or surrounding inflammatory changes. Spleen: Normal in size without focal abnormality. Adrenals/Urinary Tract: Adrenal glands are unremarkable. Kidneys are normal, without renal calculi, focal lesion, or hydronephrosis. Mild circumferential bladder wall thickening with perivesical inflammatory stranding. Stomach/Bowel: Stomach is within normal limits. Appendix appears normal. No evidence of bowel wall thickening, distention, or inflammatory changes. There are a few sigmoid colonic diverticula. Vascular/Lymphatic: Aortic atherosclerosis. No enlarged abdominal or pelvic lymph nodes. Reproductive: Status post hysterectomy. No adnexal masses. Other: No abdominal wall hernia or abnormality. No abdominopelvic ascites. No pneumoperitoneum. Musculoskeletal: No acute or significant osseous findings. Osteopenia. IMPRESSION: 1. Mild circumferential bladder wall thickening with associated mild perivesical inflammatory changes, favoring cystitis. Correlate with urinalysis. 2. Hepatic steatosis. 3.  Aortic atherosclerosis (ICD10-I70.0). Electronically Signed   By: Titus Dubin M.D.   On: 09/02/2017 10:59    EKG:   Orders placed or performed during the hospital encounter of 09/02/17  . EKG 12-Lead  . EKG 12-Lead  . EKG    ASSESSMENT AND PLAN:    72 year old female patient is with history of COPD on home oxygen, diabetes mellitus type 2, hypertension, hyperlipidemia presented to the emergency room for nausea vomiting and dysuria.  -Acute pyelonephritis with dysuria  on IV Rocephin antibiotic and follow-up urine cultures IV fluid hydration puridium  _Headache with tremors and little  finger numbness Tylenol prn , fioricet added Neuro consult  -Emphysema continue oxygen via nasal cannula Nebulization treatments and home dose inhaler  -Hyperlipidemia Continue statin medication  -Nausea and vomiting Antiemetic medications  -DVT prophylaxis subcu Lovenox daily     All the records are reviewed and case discussed with Care Management/Social Workerr. Management plans discussed with the patient, family and they are in agreement.  CODE STATUS: fc   TOTAL TIME TAKING CARE OF THIS PATIENT: 35 minutes.   POSSIBLE D/C IN 1-2  DAYS, DEPENDING ON CLINICAL CONDITION.  Note: This dictation was prepared with Dragon dictation along with smaller phrase technology. Any transcriptional errors that result from this process are unintentional.   Nicholes Mango M.D on 09/03/2017 at 2:08 PM  Between 7am to 6pm - Pager - [phone] After 6pm go to [email or web address] - password [email or web address] [facility]  [facility]  Office  [phone]  CC: Primary care physician; Katheren Shams

## 2017-09-03 NOTE — Progress Notes (Signed)
Initial Nutrition Assessment  DOCUMENTATION CODES:   Non-severe (moderate) malnutrition in context of chronic illness  INTERVENTION:   - Glucerna Shake po BID, each supplement provides 220 kcal and 10 grams of protein  - Encourage PO intake  NUTRITION DIAGNOSIS:   Moderate Malnutrition related to chronic illness(COPD) as evidenced by mild fat depletion, mild muscle depletion, moderate muscle depletion.  GOAL:   Patient will meet greater than or equal to 90% of their needs  MONITOR:   PO intake, Supplement acceptance, Weight trends, Labs  REASON FOR ASSESSMENT:   Malnutrition Screening Tool    ASSESSMENT:   72 year old female who presented to the ED with nausea, vomiting, and dysuria. PMH significant for asthma, left ear cancer, COPD on home oxygen, diabetes mellitus type 2, hyperlipidemia, emphysema, and hypertension. Pt admitted with pyelonephritis.  Spoke with pt and husband at bedside. Pt reports not having an appetite for "a long while." Pt states she has never been "a big eater." Pt's husband endorses this.  Pt reports typically eating 2 meals daily. Breakfast might include sausage, eggs, toast, and orange juice. Pt normally skips lunch. Dinner includes "something small." Per pt, her son sent her 2 cases of Ensure but she has not been drinking them. Pt agreeable to receiving Glucerna during hospitalization.  Pt reports her UBW as 136 lbs. Pt unsure when she last weighed this. Per weight history in chart, pt weighed 120 lbs in August 2017 and 124 lbs in September 2018. RD obtained bed weight at time of visit: 57.2 kg (125.8 lbs).  Medications reviewed and include: 160 mg Fenofibrate daily, 325 mg ferrous sulfate daily, sliding scale Novolog, 112 mcg levothyroxine daily, 500 mg Metformin, 5 mg Reglan TID, 1 gram Lovaza daily, 1000 mcg vitamin B-12 daily  Labs reviewed: creatinine 1.04 (H), RBC 2.83 (L), hemoglobin 9.3 (L), HCT 27.8 (L) CBG's: 167, 133, 154, 102 x 24  hours  NUTRITION - FOCUSED PHYSICAL EXAM:    Most Recent Value  Orbital Region  Mild depletion  Upper Arm Region  Mild depletion  Thoracic and Lumbar Region  Mild depletion  Buccal Region  Mild depletion  Temple Region  Mild depletion  Clavicle Bone Region  Mild depletion  Clavicle and Acromion Bone Region  Mild depletion  Scapular Bone Region  Unable to assess  Dorsal Hand  Moderate depletion  Patellar Region  Moderate depletion  Anterior Thigh Region  Moderate depletion  Posterior Calf Region  Moderate depletion  Edema (RD Assessment)  None  Hair  Reviewed  Eyes  Reviewed  Mouth  Reviewed  Skin  Reviewed  Nails  Reviewed       Diet Order:   Diet Order           Diet Heart Room service appropriate? Yes; Fluid consistency: Thin  Diet effective now          EDUCATION NEEDS:   Education needs have been addressed  Skin:  Skin Assessment: Reviewed RN Assessment  Last BM:  09/02/17  Height:   Ht Readings from Last 1 Encounters:  09/02/17 5\' 3"  (1.6 m)    Weight:   Wt Readings from Last 1 Encounters:  09/02/17 120 lb (54.4 kg)    Ideal Body Weight:  52.3 kg  BMI:  Body mass index is 21.26 kg/m.  Estimated Nutritional Needs:   Kcal:  1500-1700 kcal/day  Protein:  70-85 grams/day  Fluid:  1.5-1.7 L/day    Kristin Face, MS, RD, LDN Pager: [phone] Weekend/After Hours: [phone]

## 2017-09-04 DIAGNOSIS — R251 Tremor, unspecified: Secondary | ICD-10-CM

## 2017-09-04 LAB — GLUCOSE, CAPILLARY
GLUCOSE-CAPILLARY: 102 mg/dL — AB (ref 65–99)
Glucose-Capillary: 98 mg/dL (ref 65–99)

## 2017-09-04 LAB — URINE CULTURE

## 2017-09-04 MED ORDER — FISH OIL 1000 MG PO CAPS: 1.0000 | ORAL_CAPSULE | Freq: Every day | ORAL | Status: DC

## 2017-09-04 MED ORDER — CEPHALEXIN 500 MG PO CAPS
500.0000 mg | ORAL_CAPSULE | Freq: Two times a day (BID) | ORAL | Status: DC
  Administered 2017-09-04: 500 mg via ORAL
  Filled 2017-09-04: qty 1

## 2017-09-04 MED ORDER — GLUCERNA SHAKE PO LIQD: 237.0000 mL | Freq: Two times a day (BID) | ORAL | 0 refills | Status: DC

## 2017-09-04 MED ORDER — METOCLOPRAMIDE HCL 5 MG PO TABS: 5.0000 mg | ORAL_TABLET | Freq: Three times a day (TID) | ORAL | 1 refills | Status: DC

## 2017-09-04 MED ORDER — PHENAZOPYRIDINE HCL 100 MG PO TABS: 100.0000 mg | ORAL_TABLET | Freq: Three times a day (TID) | ORAL | 0 refills | Status: AC

## 2017-09-04 MED ORDER — CEPHALEXIN 500 MG PO CAPS: 500.0000 mg | ORAL_CAPSULE | Freq: Two times a day (BID) | ORAL | 0 refills | Status: DC

## 2017-09-04 MED ORDER — ACETAMINOPHEN 325 MG PO TABS: 650.0000 mg | ORAL_TABLET | Freq: Four times a day (QID) | ORAL | Status: DC | PRN

## 2017-09-04 NOTE — Discharge Summary (Signed)
[facility] [place] NAME: Kristin [name]    MR#:  [id number]  DATE OF BIRTH:  1945-07-18  DATE OF ADMISSION:  09/02/2017 ADMITTING PHYSICIAN: Saundra Shelling, MD  DATE OF DISCHARGE:  09/04/17  PRIMARY CARE PHYSICIAN: [facility]    ADMISSION DIAGNOSIS:  Lower urinary tract infectious disease [N39.0] Dyspnea, unspecified type [R06.00]  DISCHARGE DIAGNOSIS:  Active Problems:   Pyelonephritis   SECONDARY DIAGNOSIS:   Past Medical History:  Diagnosis Date  . Asthma   . Cancer [facility]) 2010   Left ear cancer  . COPD (chronic obstructive pulmonary disease) ([place])   . Diabetes mellitus without complication ([address])   . Emphysema lung ([place])   . Hypertension     HOSPITAL COURSE:  HISTORY OF PRESENT ILLNESS: Kristin [name]  is a 72 y.o. female with a known history of left ear cancer, COPD on home oxygen, diabetes mellitus type 2, hypertension, hyperlipidemia presented to the emergency room with nausea and vomiting.  She also has lower abdominal discomfort which is aching in nature 6 out of 10 on a scale of 1-10.  Patient complains of dysuria and her urinalysis showed infection in the emergency room.  Patient was worked up with CT kidney stone protocol which did not show any hydronephrosis or any renal stone.  Patient was given IV vancomycin and Zosyn antibiotics in the emergency room.  Lactic acid level was normal.  No complaints of any chest pain.  Hospitalist service was consulted for further care  Acute pyelonephritis with dysuria  on IV Rocephin antibiotic and follow-up urine cultures with E. coli sensitive to cefazolin and ceftriaxone will discharge her with p.o. Keflex IV fluid hydration related pyridium  _Headache with tremors and little finger numbness Tylenol prn , fioricet added Neuro consult placed.  Patient was not seen by neurology at but patient wants to leave as her symptoms are completely resolved.  Outpatient  neurology follow-up as needed  -Emphysema continue oxygen via nasal cannula Nebulization treatments and home dose inhaler  -Hyperlipidemia Continue statin medication  -Nausea and vomiting Resolved with supportive treatment  -DVT prophylaxis subcu Lovenox daily     DISCHARGE CONDITIONS:   stable  CONSULTS OBTAINED:  Treatment Team:  Alexis Goodell, MD   PROCEDURES  None   DRUG ALLERGIES:   Allergies  Allergen Reactions  . Codeine Nausea Only  . Morphine And Related Nausea Only    DISCHARGE MEDICATIONS:   Allergies as of 09/04/2017      Reactions   Codeine Nausea Only   Morphine And Related Nausea Only      Medication List    TAKE these medications   acetaminophen 325 MG tablet Commonly known as:  TYLENOL Take 2 tablets (650 mg total) by mouth every 6 (six) hours as needed for mild pain (or Fever >/= 101).   aspirin 81 MG EC tablet Take 1 tablet (81 mg total) by mouth daily. What changed:  how much to take   atorvastatin 40 MG tablet Commonly known as:  LIPITOR Take 40 mg by mouth daily.   cephALEXin 500 MG capsule Commonly known as:  KEFLEX Take 1 capsule (500 mg total) by mouth every 12 (twelve) hours.   cholecalciferol 1000 units tablet Commonly known as:  VITAMIN D Take 1,000 Units by mouth daily.   clonazePAM 1 MG tablet Commonly known as:  KLONOPIN Take 1 mg by mouth daily as needed for anxiety.   CRANBERRY EXTRACT PO Take 4,200 mg by  mouth daily.   feeding supplement (GLUCERNA SHAKE) Liqd Take 237 mLs by mouth 2 (two) times daily between meals.   fenofibrate 160 MG tablet Take 1 tablet by mouth daily.   ferrous sulfate 325 (65 FE) MG tablet Take 325 mg by mouth daily.   Fish Oil 1000 MG Caps Take 1 capsule (1,000 mg total) by mouth daily.   Fluticasone-Salmeterol 250-50 MCG/DOSE Aepb Commonly known as:  ADVAIR DISKUS Inhale 1 puff into the lungs 2 (two) times daily.   furosemide 40 MG tablet Commonly known as:   LASIX Take 40 mg by mouth daily.   insulin glargine 100 UNIT/ML injection Commonly known as:  LANTUS Inject 5 Units into the skin at bedtime.   ipratropium-albuterol 0.5-2.5 (3) MG/3ML Soln Commonly known as:  DUONEB Take 3 mLs by nebulization every 4 (four) hours as needed.   levothyroxine 112 MCG tablet Commonly known as:  SYNTHROID, LEVOTHROID Take 1 tablet by mouth daily.   losartan 100 MG tablet Commonly known as:  COZAAR Take 100 mg by mouth daily.   Magnesium 250 MG Tabs Take 250 mg by mouth daily.   metFORMIN 500 MG tablet Commonly known as:  GLUCOPHAGE Take 500 mg by mouth daily with breakfast.   metoCLOPramide 5 MG tablet Commonly known as:  REGLAN Take 1 tablet (5 mg total) by mouth 4 (four) times daily -  before meals and at bedtime.   montelukast 10 MG tablet Commonly known as:  SINGULAIR Take 10 mg by mouth daily.   pantoprazole 40 MG tablet Commonly known as:  PROTONIX Take 1 tablet by mouth 2 (two) times daily with breakfast and lunch.   phenazopyridine 100 MG tablet Commonly known as:  PYRIDIUM Take 1 tablet (100 mg total) by mouth 3 (three) times daily with meals for 5 days.   Potassium 99 MG Tabs Take 198 mg by mouth daily.   pramipexole 0.25 MG tablet Commonly known as:  MIRAPEX Take 0.25 mg by mouth at bedtime.   promethazine 25 MG tablet Commonly known as:  PHENERGAN Take 25 mg by mouth every 6 (six) hours as needed.   SPIRIVA HANDIHALER 18 MCG inhalation capsule Generic drug:  tiotropium Place 18 mcg into inhaler and inhale daily.   tiZANidine 2 MG tablet Commonly known as:  ZANAFLEX Take 2 mg by mouth 3 (three) times daily as needed. for muscle spams   traZODone 150 MG tablet Commonly known as:  DESYREL Take 150 mg by mouth at bedtime.   VENTOLIN HFA 108 (90 Base) MCG/ACT inhaler Generic drug:  albuterol Inhale 2 puffs into the lungs every 6 (six) hours as needed.   vitamin B-12 1000 MCG tablet Commonly known as:   CYANOCOBALAMIN Take 1,000 mcg by mouth daily.        DISCHARGE INSTRUCTIONS:   Follow-up with primary care physician in 5 days Follow-up with outpatient neurology as needed Continue 4 L of home oxygen  DIET:  Cardiac diet  DISCHARGE CONDITION:  Stable  ACTIVITY:  Activity as tolerated  OXYGEN:  Home Oxygen: Yes.     Oxygen Delivery: 4 liters/min via Patient connected to nasal cannula oxygen  DISCHARGE LOCATION:  home   If you experience worsening of your admission symptoms, develop shortness of breath, life threatening emergency, suicidal or homicidal thoughts you must seek medical attention immediately by calling 911 or calling your MD immediately  if symptoms less severe.  You Must read complete instructions/literature along with all the possible adverse reactions/side effects for all the  Medicines you take and that have been prescribed to you. Take any new Medicines after you have completely understood and accpet all the possible adverse reactions/side effects.   Please note  You were cared for by a hospitalist during your hospital stay. If you have any questions about your discharge medications or the care you received while you were in the hospital after you are discharged, you can call the unit and asked to speak with the hospitalist on call if the hospitalist that took care of you is not available. Once you are discharged, your primary care physician will handle any further medical issues. Please note that NO REFILLS for any discharge medications will be authorized once you are discharged, as it is imperative that you return to your primary care physician (or establish a relationship with a primary care physician if you do not have one) for your aftercare needs so that they can reassess your need for medications and monitor your lab values.     Today  Chief Complaint  Patient presents with  . Dysuria  . Shortness of Breath  . Nausea   Patient's headache, finger  numbness, time was completely resolved.  But it looks it is not significantly improved.  He really wants to go home.  Husband at bedside.  Chronically lives on 4 L of oxygen via nasal cannula will discharge her home  ROS:  CONSTITUTIONAL: Denies fevers, chills. Denies any fatigue, weakness.  EYES: Denies blurry vision, double vision, eye pain. EARS, NOSE, THROAT: Denies tinnitus, ear pain, hearing loss. RESPIRATORY: Denies cough, wheeze, shortness of breath.  CARDIOVASCULAR: Denies chest pain, palpitations, edema.  GASTROINTESTINAL: Denies nausea, vomiting, diarrhea, abdominal pain. Denies bright red blood per rectum. GENITOURINARY: Denies dysuria, hematuria. ENDOCRINE: Denies nocturia or thyroid problems. HEMATOLOGIC AND LYMPHATIC: Denies easy bruising or bleeding. SKIN: Denies rash or lesion. MUSCULOSKELETAL: Denies pain in neck, back, shoulder, knees, hips or arthritic symptoms.  NEUROLOGIC: Denies paralysis, paresthesias.  PSYCHIATRIC: Denies anxiety or depressive symptoms.   VITAL SIGNS:  Blood pressure (!) 136/48, pulse 81, temperature 98.8 F (37.1 C), temperature source Oral, resp. rate (!) 22, height 5\' 3"  (1.6 m), weight 54.4 kg (120 lb), SpO2 97 %.  I/O:    Intake/Output Summary (Last 24 hours) at 09/04/2017 1201 Last data filed at 09/04/2017 0400 Gross per 24 hour  Intake 1773.33 ml  Output -  Net 1773.33 ml    PHYSICAL EXAMINATION:  GENERAL:  72 y.o.-year-old patient lying in the bed with no acute distress.  EYES: Pupils equal, round, reactive to light and accommodation. No scleral icterus. Extraocular muscles intact.  HEENT: Head atraumatic, normocephalic. Oropharynx and nasopharynx clear.  NECK:  Supple, no jugular venous distention. No thyroid enlargement, no tenderness.  LUNGS: Normal breath sounds bilaterally, no wheezing, rales,rhonchi or crepitation. No use of accessory muscles of respiration.  CARDIOVASCULAR: S1, S2 normal. No murmurs, rubs, or gallops.   ABDOMEN: Soft, non-tender, non-distended. Bowel sounds present. No organomegaly or mass.  EXTREMITIES: No pedal edema, cyanosis, or clubbing.  NEUROLOGIC: Cranial nerves II through XII are intact. Muscle strength 5/5 in all extremities. Sensation intact. Gait not checked.  PSYCHIATRIC: The patient is alert and oriented x 3.  SKIN: No obvious rash, lesion, or ulcer.   DATA REVIEW:   CBC Recent Labs  Lab 09/03/17 0403  WBC 8.4  HGB 9.3*  HCT 27.8*  PLT 203    Chemistries  Recent Labs  Lab 09/02/17 0939 09/03/17 0403  NA 138 140  K 3.9 3.9  CL 103 108  CO2 23 25  GLUCOSE 162* 137*  BUN 15 11  CREATININE 0.98 1.04*  CALCIUM 9.1 8.6*  AST 15  --   ALT 11*  --   ALKPHOS 54  --   BILITOT 0.9  --     Cardiac Enzymes Recent Labs  Lab 09/02/17 0939  TROPONINI <0.03    Microbiology Results  Results for orders placed or performed during the hospital encounter of 09/02/17  Blood Culture (routine x 2)     Status: None (Preliminary result)   Collection Time: 09/02/17  9:39 AM  Result Value Ref Range Status   Specimen Description BLOOD BLOOD LEFT ARM  Final   Special Requests   Final    BOTTLES DRAWN AEROBIC AND ANAEROBIC Blood Culture adequate volume   Culture   Final    NO GROWTH 2 DAYS Performed at [facility], [address], [place], [place] [address]    Report Status PENDING  Incomplete  Blood Culture (routine x 2)     Status: None (Preliminary result)   Collection Time: 09/02/17  9:39 AM  Result Value Ref Range Status   Specimen Description BLOOD RIGHT ANTECUBITAL  Final   Special Requests   Final    BOTTLES DRAWN AEROBIC AND ANAEROBIC Blood Culture adequate volume   Culture   Final    NO GROWTH 2 DAYS Performed at [name], [address] [place], [place] [address]    Report Status PENDING  Incomplete  Urine culture     Status: Abnormal   Collection Time: 09/02/17  9:39 AM  Result Value Ref Range Status   Specimen Description    Final    URINE, RANDOM Performed at [facility], [address] [place], [place] [address]    Special Requests   Final    NONE Performed at [facility], [address] [place], [place] [address]    Culture >=100,000 COLONIES/mL ESCHERICHIA COLI (A)  Final   Report Status 09/04/2017 FINAL  Final   Organism ID, Bacteria ESCHERICHIA COLI (A)  Final      Susceptibility   Escherichia coli - MIC*    AMPICILLIN >=32 RESISTANT Resistant     CEFAZOLIN 16 SENSITIVE Sensitive     CEFTRIAXONE <=1 SENSITIVE Sensitive     CIPROFLOXACIN <=0.25 SENSITIVE Sensitive     GENTAMICIN <=1 SENSITIVE Sensitive     IMIPENEM <=0.25 SENSITIVE Sensitive     NITROFURANTOIN <=16 SENSITIVE Sensitive     TRIMETH/SULFA <=20 SENSITIVE Sensitive     AMPICILLIN/SULBACTAM >=32 RESISTANT Resistant     PIP/TAZO 64 INTERMEDIATE Intermediate     Extended ESBL NEGATIVE Sensitive     * >=100,000 COLONIES/mL ESCHERICHIA COLI    RADIOLOGY:  Dg Chest Port 1 View  Result Date: 09/02/2017 CLINICAL DATA:  The vert.  Shortness of breath.  History of COPD. EXAM: PORTABLE CHEST 1 VIEW COMPARISON:  CT chest and chest x-ray dated June 24, 2017. FINDINGS: The heart size and mediastinal contours are within normal limits. Normal pulmonary vascularity. Atherosclerotic calcification of the aortic arch. The lungs remain hyperinflated with emphysematous changes. Unchanged bibasilar scarring. No focal consolidation, pleural effusion, or pneumothorax. No acute osseous abnormality. IMPRESSION: COPD.  No active disease. Electronically Signed   By: Titus Dubin M.D.   On: 09/02/2017 10:09   Ct Renal Stone Study  Result Date: 09/02/2017 CLINICAL DATA:  Nausea and suprapubic pain for the past week. EXAM: CT ABDOMEN AND PELVIS WITHOUT CONTRAST TECHNIQUE:  Multidetector CT imaging of the abdomen and pelvis was performed following the standard protocol without IV contrast. COMPARISON:  CT abdomen pelvis dated Aug 14, 2017.  FINDINGS: Lower chest: Unchanged left basilar scarring.  No acute abnormality. Hepatobiliary: Hepatic steatosis. No focal liver abnormality. Status post cholecystectomy. No biliary dilatation. Pancreas: Unremarkable. No pancreatic ductal dilatation or surrounding inflammatory changes. Spleen: Normal in size without focal abnormality. Adrenals/Urinary Tract: Adrenal glands are unremarkable. Kidneys are normal, without renal calculi, focal lesion, or hydronephrosis. Mild circumferential bladder wall thickening with perivesical inflammatory stranding. Stomach/Bowel: Stomach is within normal limits. Appendix appears normal. No evidence of bowel wall thickening, distention, or inflammatory changes. There are a few sigmoid colonic diverticula. Vascular/Lymphatic: Aortic atherosclerosis. No enlarged abdominal or pelvic lymph nodes. Reproductive: Status post hysterectomy. No adnexal masses. Other: No abdominal wall hernia or abnormality. No abdominopelvic ascites. No pneumoperitoneum. Musculoskeletal: No acute or significant osseous findings. Osteopenia. IMPRESSION: 1. Mild circumferential bladder wall thickening with associated mild perivesical inflammatory changes, favoring cystitis. Correlate with urinalysis. 2. Hepatic steatosis. 3.  Aortic atherosclerosis (ICD10-I70.0). Electronically Signed   By: Titus Dubin M.D.   On: 09/02/2017 10:59    EKG:   Orders placed or performed during the hospital encounter of 09/02/17  . EKG 12-Lead  . EKG 12-Lead  . EKG      Management plans discussed with the patient, family and they are in agreement.  CODE STATUS:     Code Status Orders  (From admission, onward)        Start     Ordered   09/02/17 1253  Full code  Continuous     09/02/17 1252    Code Status History    Date Active Date Inactive Code Status Order ID Comments User Context   11/30/2016 1912 12/01/2016 1622 Full Code [id number]  Idelle Crouch, MD Inpatient   10/23/2015 0050 10/23/2015 1504 Full  Code [id number]  Hugelmeyer, Ubaldo Glassing, DO ED      TOTAL TIME TAKING CARE OF THIS PATIENT: 41 minutes.   Note: This dictation was prepared with Dragon dictation along with smaller phrase technology. Any transcriptional errors that result from this process are unintentional.   @[facility] @  on 09/04/2017 at 12:01 PM  Between 7am to 6pm - Pager - [phone]  After 6pm go to [email or web address] - password [email or web address] [facility]  [facility]  Office  [phone]  CC: Primary care physician; Katheren Shams

## 2017-09-04 NOTE — Progress Notes (Signed)
Discharge instructions along with home medications and follow up gone over with patient and husband. Both verbalize that they understood instructions. All prescriptions given to patient. IV and tele removed. Pt being discharged home on 4L 02, no distress noted. Ammie Dalton, RN

## 2017-09-04 NOTE — Consult Note (Signed)
Reason for Consult:Tremor, headache Referring Physician: Gouru  CC: Tremor  HPI: Kristin [name] is an 72 y.o. female who was admitted with pyelonephritis on 6/19.  Patient on yesterday noted to have head and upper extremity tremor.  Has paresthesias in the last two fingers on her left hand and was experiencing headaches.  Patient much improved today.  No longer with head tremor, headache or paresthesias.  Tremor minimal.    Past Medical History:  Diagnosis Date  . Asthma   . 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 COPD (chronic obstructive pulmonary disease) ([place])   . Diabetes mellitus without complication ([place])   . Emphysema lung ([place])   . Hypertension     History reviewed. No pertinent surgical history.  Family History  Problem Relation Age of Onset  . Ovarian cancer Mother   . Lung cancer Father     Social History:  reports that she has quit smoking. She has never used smokeless tobacco. She reports that she does not drink alcohol or use drugs.  Allergies  Allergen Reactions  . Codeine Nausea Only  . Morphine And Related Nausea Only    Medications:  I have reviewed the patient's current medications. Prior to Admission:  Medications Prior to Admission  Medication Sig Dispense Refill Last Dose  . aspirin EC 81 MG EC tablet Take 1 tablet (81 mg total) by mouth daily. (Patient taking differently: Take 162 mg by mouth daily. ) 30 tablet 0 09/02/2017 at 0800  . atorvastatin (LIPITOR) 40 MG tablet Take 40 mg by mouth daily.    09/01/2017 at 1800  . cholecalciferol (VITAMIN D) 1000 units tablet Take 1,000 Units by mouth daily.   09/02/2017 at 0800  . clonazePAM (KLONOPIN) 1 MG tablet Take 1 mg by mouth daily as needed for anxiety.    PRN at PRN  . CRANBERRY EXTRACT PO Take 4,200 mg by mouth daily.   09/02/2017 at 0800  . fenofibrate 160 MG tablet Take 1 tablet by mouth daily.   09/02/2017 at 0800  . ferrous sulfate 325 (65 FE) MG tablet Take 325 mg by mouth daily.   09/02/2017  at 0800  . furosemide (LASIX) 40 MG tablet Take 40 mg by mouth daily.   09/02/2017 at 0800  . insulin glargine (LANTUS) 100 UNIT/ML injection Inject 5 Units into the skin at bedtime.   As directed at As directed  . ipratropium-albuterol (DUONEB) 0.5-2.5 (3) MG/3ML SOLN Take 3 mLs by nebulization every 4 (four) hours as needed. 360 mL 0 PRN at PRN  . levothyroxine (SYNTHROID, LEVOTHROID) 112 MCG tablet Take 1 tablet by mouth daily.   09/02/2017 at 0800  . losartan (COZAAR) 100 MG tablet Take 100 mg by mouth daily.   09/02/2017 at 0800  . Magnesium 250 MG TABS Take 250 mg by mouth daily.   09/02/2017 at 0800  . metFORMIN (GLUCOPHAGE) 500 MG tablet Take 500 mg by mouth daily with breakfast.   09/02/2017 at 0800  . montelukast (SINGULAIR) 10 MG tablet Take 10 mg by mouth daily.  11 09/02/2017 at 0800  . pantoprazole (PROTONIX) 40 MG tablet Take 1 tablet by mouth 2 (two) times daily with breakfast and lunch.   09/02/2017 at 0800  . Potassium 99 MG TABS Take 198 mg by mouth daily.   09/02/2017 at 0800  . pramipexole (MIRAPEX) 0.25 MG tablet Take 0.25 mg by mouth at bedtime.   09/01/2017 at 2100  . promethazine (PHENERGAN) 25 MG tablet Take  25 mg by mouth every 6 (six) hours as needed.    PRN at PRN  . SPIRIVA HANDIHALER 18 MCG inhalation capsule Place 18 mcg into inhaler and inhale daily.   09/02/2017 at 0800  . tiZANidine (ZANAFLEX) 2 MG tablet Take 2 mg by mouth 3 (three) times daily as needed. for muscle spams  0 PRN at PRN  . traZODone (DESYREL) 150 MG tablet Take 150 mg by mouth at bedtime.  1 09/01/2017 at 2100  . VENTOLIN HFA 108 (90 Base) MCG/ACT inhaler Inhale 2 puffs into the lungs every 6 (six) hours as needed.    PRN at PRN  . vitamin B-12 (CYANOCOBALAMIN) 1000 MCG tablet Take 1,000 mcg by mouth daily.   09/02/2017 at 0800  . Fluticasone-Salmeterol (ADVAIR DISKUS) 250-50 MCG/DOSE AEPB Inhale 1 puff into the lungs 2 (two) times daily. (Patient not taking: Reported on 09/02/2017) 60 each 0 Not Taking at  Unknown time   Scheduled: . aspirin EC  162 mg Oral Daily  . atorvastatin  40 mg Oral q1800  . cephALEXin  500 mg Oral Q12H  . clonazePAM  1 mg Oral Daily  . enoxaparin (LOVENOX) injection  40 mg Subcutaneous Q24H  . feeding supplement (GLUCERNA SHAKE)  237 mL Oral BID BM  . fenofibrate  160 mg Oral Daily  . ferrous sulfate  325 mg Oral Daily  . insulin aspart  0-5 Units Subcutaneous QHS  . insulin aspart  0-9 Units Subcutaneous TID WC  . levothyroxine  112 mcg Oral Daily  . loratadine  10 mg Oral Daily  . metoCLOPramide  5 mg Oral TID AC & HS  . mometasone-formoterol  2 puff Inhalation BID  . montelukast  10 mg Oral Daily  . omega-3 acid ethyl esters  1 g Oral Daily  . phenazopyridine  100 mg Oral TID WC  . pramipexole  0.25 mg Oral QHS  . traZODone  150 mg Oral QHS  . vitamin B-12  1,000 mcg Oral Daily    ROS: History obtained from the patient  General ROS: negative for - chills, fatigue, fever, night sweats, weight gain or weight loss Psychological ROS: negative for - behavioral disorder, hallucinations, memory difficulties, mood swings or suicidal ideation Ophthalmic ROS: negative for - blurry vision, double vision, eye pain or loss of vision ENT ROS: negative for - epistaxis, nasal discharge, oral lesions, sore throat, tinnitus or vertigo Allergy and Immunology ROS: negative for - hives or itchy/watery eyes Hematological and Lymphatic ROS: negative for - bleeding problems, bruising or swollen lymph nodes Endocrine ROS: negative for - galactorrhea, hair pattern changes, polydipsia/polyuria or temperature intolerance Respiratory ROS: negative for - cough, hemoptysis, shortness of breath or wheezing Cardiovascular ROS: negative for - chest pain, dyspnea on exertion, edema or irregular heartbeat Gastrointestinal ROS: abdominal pain, nausea/vomiting Genito-Urinary ROS: negative for - dysuria, hematuria, incontinence or urinary frequency/urgency Musculoskeletal ROS: negative for  - joint swelling or muscular weakness Neurological ROS: as noted in HPI Dermatological ROS: negative for rash and skin lesion changes  Physical Examination: Blood pressure (!) 136/48, pulse 81, temperature 98.8 F (37.1 C), temperature source Oral, resp. rate (!) 22, height 5\' 3"  (1.6 m), weight 54.4 kg (120 lb), SpO2 97 %.  HEENT-  Normocephalic, no lesions, without obvious abnormality.  Normal external eye and conjunctiva.  Normal TM's bilaterally.  Normal auditory canals and external ears. Normal external nose, mucus membranes and septum.  Normal pharynx. Cardiovascular- S1, S2 normal, pulses palpable throughout   Lungs- chest clear,  no wheezing, rales, normal symmetric air entry Abdomen- soft, non-tender; bowel sounds normal; no masses,  no organomegaly Extremities- no edema Lymph-no adenopathy palpable Musculoskeletal-no joint tenderness, deformity or swelling Skin-warm and dry, no hyperpigmentation, vitiligo, or suspicious lesions  Neurological Examination   Mental Status: Alert, oriented, thought content appropriate.  Speech fluent without evidence of aphasia.  Able to follow 3 step commands without difficulty. Cranial Nerves: II: Discs flat bilaterally; Visual fields grossly normal, pupils equal, round, reactive to light and accommodation III,IV, VI: ptosis not present, extra-ocular motions intact bilaterally V,VII: smile symmetric, facial light touch sensation normal bilaterally VIII: hearing normal bilaterally IX,X: gag reflex present XI: bilateral shoulder shrug XII: midline tongue extension Motor: Patient able to lift all extremities against gravity.  No focal weakness noted.  Sensory: Pinprick and light touch intact throughout, bilaterally Deep Tendon Reflexes: 2+ and symmetric throughout Cerebellar: Normal finger-to-nose and normal heel-to-shin testing noted.  Low amplitude, high frequency tremor noted in the hands bilaterally, left greater than right.    Laboratory  Studies:   Basic Metabolic Panel: Recent Labs  Lab 09/02/17 0939 09/03/17 0403  NA 138 140  K 3.9 3.9  CL 103 108  CO2 23 25  GLUCOSE 162* 137*  BUN 15 11  CREATININE 0.98 1.04*  CALCIUM 9.1 8.6*    Liver Function Tests: Recent Labs  Lab 09/02/17 0939  AST 15  ALT 11*  ALKPHOS 54  BILITOT 0.9  PROT 7.1  ALBUMIN 4.4   No results for input(s): LIPASE, AMYLASE in the last 168 hours. No results for input(s): AMMONIA in the last 168 hours.  CBC: Recent Labs  Lab 09/02/17 0939 09/03/17 0403  WBC 15.3* 8.4  NEUTROABS 13.7*  --   HGB 11.4* 9.3*  HCT 34.3* 27.8*  MCV 96.9 98.1  PLT 249 203    Cardiac Enzymes: Recent Labs  Lab 09/02/17 0939  TROPONINI <0.03    BNP: Invalid input(s): POCBNP  CBG: Recent Labs  Lab 09/03/17 1204 09/03/17 1251 09/03/17 1736 09/03/17 2053 09/04/17 0740  GLUCAP 167* 120* 120* 163* 66    Microbiology: Results for orders placed or performed during the hospital encounter of 09/02/17  Blood Culture (routine x 2)     Status: None (Preliminary result)   Collection Time: 09/02/17  9:39 AM  Result Value Ref Range Status   Specimen Description BLOOD BLOOD LEFT ARM  Final   Special Requests   Final    BOTTLES DRAWN AEROBIC AND ANAEROBIC Blood Culture adequate volume   Culture   Final    NO GROWTH 2 DAYS Performed at [facility], [address], [place], [place] [address]    Report Status PENDING  Incomplete  Blood Culture (routine x 2)     Status: None (Preliminary result)   Collection Time: 09/02/17  9:39 AM  Result Value Ref Range Status   Specimen Description BLOOD RIGHT ANTECUBITAL  Final   Special Requests   Final    BOTTLES DRAWN AEROBIC AND ANAEROBIC Blood Culture adequate volume   Culture   Final    NO GROWTH 2 DAYS Performed at [facility], [address], [place], [place] [address]    Report Status PENDING  Incomplete  Urine culture     Status: Abnormal   Collection Time: 09/02/17  9:39 AM   Result Value Ref Range Status   Specimen Description   Final    URINE, RANDOM Performed at [facility], [address] [place], [place] [address]  Special Requests   Final    NONE Performed at [facility], [address] [place], [place] [address]    Culture >=100,000 COLONIES/mL ESCHERICHIA COLI (A)  Final   Report Status 09/04/2017 FINAL  Final   Organism ID, Bacteria ESCHERICHIA COLI (A)  Final      Susceptibility   Escherichia coli - MIC*    AMPICILLIN >=32 RESISTANT Resistant     CEFAZOLIN 16 SENSITIVE Sensitive     CEFTRIAXONE <=1 SENSITIVE Sensitive     CIPROFLOXACIN <=0.25 SENSITIVE Sensitive     GENTAMICIN <=1 SENSITIVE Sensitive     IMIPENEM <=0.25 SENSITIVE Sensitive     NITROFURANTOIN <=16 SENSITIVE Sensitive     TRIMETH/SULFA <=20 SENSITIVE Sensitive     AMPICILLIN/SULBACTAM >=32 RESISTANT Resistant     PIP/TAZO 64 INTERMEDIATE Intermediate     Extended ESBL NEGATIVE Sensitive     * >=100,000 COLONIES/mL ESCHERICHIA COLI    Coagulation Studies: No results for input(s): LABPROT, INR in the last 72 hours.  Urinalysis:  Recent Labs  Lab 09/02/17 0939  COLORURINE AMBER*  LABSPEC 1.014  PHURINE 6.0  GLUCOSEU NEGATIVE  HGBUR SMALL*  BILIRUBINUR NEGATIVE  KETONESUR NEGATIVE  PROTEINUR 100*  NITRITE POSITIVE*  LEUKOCYTESUR MODERATE*    Lipid Panel:     Component Value Date/Time   CHOL 146 10/23/2015 0750   TRIG 145 10/23/2015 0750   HDL 45 10/23/2015 0750   CHOLHDL 3.2 10/23/2015 0750   VLDL 29 10/23/2015 0750   LDLCALC 72 10/23/2015 0750    HgbA1C:  Lab Results  Component Value Date   HGBA1C 5.4 10/23/2015    Urine Drug Screen:      Component Value Date/Time   LABOPIA NONE DETECTED 11/30/2016 1606   COCAINSCRNUR NONE DETECTED 11/30/2016 1606   LABBENZ NONE DETECTED 11/30/2016 1606   AMPHETMU NONE DETECTED 11/30/2016 1606   THCU NONE DETECTED 11/30/2016 1606   LABBARB NONE DETECTED 11/30/2016 1606    Alcohol  Level: No results for input(s): ETH in the last 168 hours.  Other results: EKG: sinus rhythm at 97 bpm.  Imaging: No results found.   Assessment/Plan: 72 year old female admitted with pyelonephritis who developed tremor, headache and left hand paresthesias.  Although left hand paresthesias sound like a transient ulnar neuropathy likely related to compression injury, the other symptoms may very well have been related to a metabolic etiology .  Patient improving significantly.  No further neurologic intervention is recommended at this time.  If further questions arise, please call or page at that time.  Thank you for allowing neurology to participate in the care of this patient.   Alexis Goodell, MD Neurology [phone] 09/04/2017, 11:33 AM

## 2017-09-04 NOTE — Discharge Instructions (Signed)
°  Follow-up with primary care physician in 5 days Follow-up with outpatient neurology as needed Continue 4 L of home oxygen     Nutrition [facility] Stay Proper nutrition can help your body recover from illness and injury.   Foods and beverages high in protein, vitamins, and minerals help rebuild muscle loss, promote healing, & reduce fall risk.   In addition to eating healthy foods, a nutrition shake is an easy, delicious way to get the nutrition you need during and after your hospital stay  It is recommended that you continue to drink 2 bottles per day of:       Glucerna for at least 1 month (30 days) after your hospital stay   Tips for adding a nutrition shake into your routine: As allowed, drink one with vitamins or medications instead of water or juice Enjoy one as a tasty mid-morning or afternoon snack Drink cold or make a milkshake out of it Drink one instead of milk with cereal or snacks Use as a coffee creamer   Available at the following grocery stores and pharmacies:           * [facility] [phone]            For COUPONS visit: www.ensure.com/join or http://dawson-may.com/   Suggested Substitutions Ensure Plus = Boost Plus = Carnation Breakfast Essentials = Boost Compact Ensure Active Clear = Boost Breeze Glucerna Shake = Boost Glucose Control = Carnation Breakfast Essentials SUGAR FREE

## 2017-09-07 LAB — CULTURE, BLOOD (ROUTINE X 2)
Culture: NO GROWTH
Culture: NO GROWTH
Special Requests: ADEQUATE
Special Requests: ADEQUATE

## 2017-10-01 ENCOUNTER — Encounter (INDEPENDENT_AMBULATORY_CARE_PROVIDER_SITE_OTHER): Payer: Medicare Other

## 2017-10-01 ENCOUNTER — Ambulatory Visit (INDEPENDENT_AMBULATORY_CARE_PROVIDER_SITE_OTHER): Payer: Medicare Other | Admitting: Vascular Surgery

## 2017-10-14 ENCOUNTER — Other Ambulatory Visit: Payer: Self-pay | Admitting: Internal Medicine

## 2017-10-14 DIAGNOSIS — Z1231 Encounter for screening mammogram for malignant neoplasm of breast: Secondary | ICD-10-CM

## 2017-10-23 ENCOUNTER — Other Ambulatory Visit (INDEPENDENT_AMBULATORY_CARE_PROVIDER_SITE_OTHER): Payer: Self-pay | Admitting: Vascular Surgery

## 2017-10-23 DIAGNOSIS — I6523 Occlusion and stenosis of bilateral carotid arteries: Secondary | ICD-10-CM

## 2017-10-26 ENCOUNTER — Encounter (INDEPENDENT_AMBULATORY_CARE_PROVIDER_SITE_OTHER): Payer: Self-pay | Admitting: Vascular Surgery

## 2017-10-26 ENCOUNTER — Ambulatory Visit (INDEPENDENT_AMBULATORY_CARE_PROVIDER_SITE_OTHER): Payer: Medicare Other

## 2017-10-26 ENCOUNTER — Ambulatory Visit (INDEPENDENT_AMBULATORY_CARE_PROVIDER_SITE_OTHER): Payer: Medicare Other | Admitting: Vascular Surgery

## 2017-10-26 DIAGNOSIS — E119 Type 2 diabetes mellitus without complications: Secondary | ICD-10-CM | POA: Insufficient documentation

## 2017-10-26 DIAGNOSIS — J449 Chronic obstructive pulmonary disease, unspecified: Secondary | ICD-10-CM | POA: Insufficient documentation

## 2017-10-26 DIAGNOSIS — I6523 Occlusion and stenosis of bilateral carotid arteries: Secondary | ICD-10-CM | POA: Diagnosis not present

## 2017-10-26 DIAGNOSIS — I6529 Occlusion and stenosis of unspecified carotid artery: Secondary | ICD-10-CM | POA: Insufficient documentation

## 2017-10-26 DIAGNOSIS — I10 Essential (primary) hypertension: Secondary | ICD-10-CM | POA: Insufficient documentation

## 2017-10-26 DIAGNOSIS — E118 Type 2 diabetes mellitus with unspecified complications: Secondary | ICD-10-CM | POA: Diagnosis not present

## 2017-10-26 NOTE — Progress Notes (Signed)
MRN : [id number]  Kristin [name] is a 72 y.o. (05-19-1945) female who presents with chief complaint of  Chief Complaint  Patient presents with  . Carotid    109yr follow up  .  History of Present Illness:   The patient is seen for follow up evaluation of carotid stenosis. The carotid stenosis followed by ultrasound.   The patient denies amaurosis fugax. There is no recent history of TIA symptoms or focal motor deficits. There is no prior documented CVA.  The patient is taking enteric-coated aspirin 81 mg daily.  There is no history of migraine headaches. There is no history of seizures.  The patient has a history of coronary artery disease, no recent episodes of angina or shortness of breath. The patient denies PAD or claudication symptoms. There is a history of hyperlipidemia which is being treated with a statin.    Carotid Duplex done today shows <40% bilaterally.  No change compared to last study in 09/2016  Current Meds  Medication Sig  . acetaminophen (TYLENOL) 325 MG tablet Take 2 tablets (650 mg total) by mouth every 6 (six) hours as needed for mild pain (or Fever >/= 101).  . Acetaminophen-Pamabrom (CRAMP TABS PO) Take 2 tablets by mouth at bedtime.  Marland Kitchen aspirin EC 81 MG EC tablet Take 1 tablet (81 mg total) by mouth daily. (Patient taking differently: Take 162 mg by mouth daily. )  . atorvastatin (LIPITOR) 40 MG tablet Take 40 mg by mouth daily.   . cholecalciferol (VITAMIN D) 1000 units tablet Take 1,000 Units by mouth daily.  . clonazePAM (KLONOPIN) 1 MG tablet Take 1 mg by mouth daily as needed for anxiety.   Marland Kitchen CRANBERRY EXTRACT PO Take 4,200 mg by mouth daily.  . feeding supplement, GLUCERNA SHAKE, (GLUCERNA SHAKE) LIQD Take 237 mLs by mouth 2 (two) times daily between meals.  . fenofibrate 160 MG tablet Take 1 tablet by mouth daily.  . ferrous sulfate 325 (65 FE) MG tablet Take 325 mg by mouth daily.  . Fluticasone-Salmeterol (ADVAIR DISKUS) 250-50 MCG/DOSE  AEPB Inhale 1 puff into the lungs 2 (two) times daily.  . furosemide (LASIX) 40 MG tablet Take 40 mg by mouth daily.  . insulin glargine (LANTUS) 100 UNIT/ML injection Inject 5 Units into the skin at bedtime.  Marland Kitchen levothyroxine (SYNTHROID, LEVOTHROID) 112 MCG tablet Take 1 tablet by mouth daily.  Marland Kitchen losartan (COZAAR) 100 MG tablet Take 100 mg by mouth daily.  . Magnesium 250 MG TABS Take 250 mg by mouth daily.  . metFORMIN (GLUCOPHAGE) 500 MG tablet Take 500 mg by mouth daily with breakfast.  . montelukast (SINGULAIR) 10 MG tablet Take 10 mg by mouth daily.  . pantoprazole (PROTONIX) 40 MG tablet Take 1 tablet by mouth 2 (two) times daily with breakfast and lunch.  . Potassium 99 MG TABS Take 198 mg by mouth daily.  . pramipexole (MIRAPEX) 0.25 MG tablet Take 0.25 mg by mouth at bedtime.  . promethazine (PHENERGAN) 25 MG tablet Take 25 mg by mouth every 6 (six) hours as needed.   . sucralfate (CARAFATE) 1 g tablet Take 1 g by mouth 2 (two) times daily.  . traMADol-acetaminophen (ULTRACET) 37.5-325 MG tablet Take 1 tablet by mouth at bedtime.  . traZODone (DESYREL) 150 MG tablet Take 150 mg by mouth at bedtime.  . VENTOLIN HFA 108 (90 Base) MCG/ACT inhaler Inhale 2 puffs into the lungs every 6 (six) hours as needed.   . vitamin B-12 (CYANOCOBALAMIN) 1000  MCG tablet Take 1,000 mcg by mouth daily.    Past Medical History:  Diagnosis Date  . Asthma   . Cancer [facility]) 2010   Left ear cancer  . COPD (chronic obstructive pulmonary disease) ([place])   . Diabetes mellitus without complication ([place])   . Emphysema lung ([place])   . Hypertension   . Stroke [facility])     Past Surgical History:  Procedure Laterality Date  . ABDOMINAL HYSTERECTOMY    . CHOLECYSTECTOMY    . NECK SURGERY      Social History Social History   Tobacco Use  . Smoking status: Former Research scientist (life sciences)  . Smokeless tobacco: Never Used  Substance Use Topics  . Alcohol use: No  . Drug use: No    Family History Family History  Problem  Relation Age of Onset  . Ovarian cancer Mother   . Lung cancer Father     Allergies  Allergen Reactions  . Codeine Nausea Only  . Morphine And Related Nausea Only     REVIEW OF SYSTEMS (Negative unless checked)  Constitutional: [] Weight loss  [] Fever  [] Chills Cardiac: [] Chest pain   [] Chest pressure   [] Palpitations   [] Shortness of breath when laying flat   [] Shortness of breath with exertion. Vascular:  [] Pain in legs with walking   [] Pain in legs at rest  [] History of DVT   [] Phlebitis   [] Swelling in legs   [] Varicose veins   [] Non-healing ulcers Pulmonary:   [] Uses home oxygen   [] Productive cough   [] Hemoptysis   [] Wheeze  [] COPD   [] Asthma Neurologic:  [x] Dizziness   [] Seizures   [] History of stroke   [] History of TIA  [] Aphasia   [] Vissual changes   [] Weakness or numbness in arm   [] Weakness or numbness in leg Musculoskeletal:   [] Joint swelling   [] Joint pain   [] Low back pain Hematologic:  [] Easy bruising  [] Easy bleeding   [] Hypercoagulable state   [] Anemic Gastrointestinal:  [] Diarrhea   [] Vomiting  [] Gastroesophageal reflux/heartburn   [] Difficulty swallowing. Genitourinary:  [] Chronic kidney disease   [] Difficult urination  [] Frequent urination   [] Blood in urine Skin:  [] Rashes   [] Ulcers  Psychological:  [] History of anxiety   []  History of major depression.  Physical Examination  Vitals:   10/26/17 1400 10/26/17 1401  BP: 133/64 136/67  Pulse: 79   Resp: 16   Weight: 125 lb (56.7 kg)   Height: 5\' 4"  (1.626 m)    Body mass index is 21.46 kg/m. Gen: WD/WN, NAD Head: [place]/AT, No temporalis wasting.  Ear/Nose/Throat: Hearing grossly intact, nares w/o erythema or drainage Eyes: PER, EOMI, sclera nonicteric.  Neck: Supple, no large masses.   Pulmonary:  Good air movement, no audible wheezing bilaterally, no use of accessory muscles.  Cardiac: RRR, no JVD Vascular: bilateral carotid stenosis Vessel Right Left  Radial Palpable Palpable  Brachial Palpable  Palpable  Carotid Palpable Palpable  Gastrointestinal: Non-distended. No guarding/no peritoneal signs.  Musculoskeletal: M/S 5/5 throughout.  No deformity or atrophy.  Neurologic: CN 2-12 intact. Symmetrical.  Speech is fluent. Motor exam as listed above. Psychiatric: Judgment intact, Mood & affect appropriate for pt's clinical situation. Dermatologic: No rashes or ulcers noted.  No changes consistent with cellulitis. Lymph : No lichenification or skin changes of chronic lymphedema.  CBC Lab Results  Component Value Date   WBC 8.4 09/03/2017   HGB 9.3 (L) 09/03/2017   HCT 27.8 (L) 09/03/2017   MCV 98.1 09/03/2017   PLT 203 09/03/2017  BMET    Component Value Date/Time   NA 140 09/03/2017 0403   NA 142 06/07/2013 0403   K 3.9 09/03/2017 0403   K 4.1 06/07/2013 0403   CL 108 09/03/2017 0403   CL 109 (H) 06/07/2013 0403   CO2 25 09/03/2017 0403   CO2 27 06/07/2013 0403   GLUCOSE 137 (H) 09/03/2017 0403   GLUCOSE 212 (H) 06/07/2013 0403   BUN 11 09/03/2017 0403   BUN 21 (H) 06/07/2013 0403   CREATININE 1.04 (H) 09/03/2017 0403   CREATININE 0.94 06/07/2013 0403   CALCIUM 8.6 (L) 09/03/2017 0403   CALCIUM 8.3 (L) 06/07/2013 0403   GFRNONAA 53 (L) 09/03/2017 0403   GFRNONAA >60 06/07/2013 0403   GFRAA >60 09/03/2017 0403   GFRAA >60 06/07/2013 0403   CrCl cannot be calculated (Patient's most recent lab result is older than the maximum 21 days allowed.).  COAG Lab Results  Component Value Date   INR 0.92 05/23/2016   INR 1.0 06/06/2013    Radiology No results found.   Assessment/Plan 1. Bilateral carotid artery stenosis Recommend:  Given the patient's asymptomatic subcritical stenosis no further invasive testing or surgery at this time.  Duplex ultrasound shows <40% stenosis bilaterally.  Continue antiplatelet therapy as prescribed Continue management of CAD, HTN and Hyperlipidemia Healthy heart diet,  encouraged exercise at least 4 times per week Follow  up in 2 years with duplex ultrasound and physical exam   - VAS US CAROTID; Future  2. Type 2 diabetes mellitus with complication, without long-term current use of insulin (HCC) Continue hypoglycemic medications as already ordered, these medications have been reviewed and there are no changes at this time.  Hgb A1C to be monitored as already arranged by primary service   3. Essential hypertension Continue antihypertensive medications as already ordered, these medications have been reviewed and there are no changes at this time.   4. Chronic obstructive pulmonary disease, unspecified COPD type ([place]) Continue pulmonary medications and aerosols as already ordered, these medications have been reviewed and there are no changes at this time.    Hortencia Pilar, MD  10/26/2017 9:10 PM

## 2017-11-04 ENCOUNTER — Ambulatory Visit
Admission: RE | Admit: 2017-11-04 | Discharge: 2017-11-04 | Disposition: A | Discharge status: Home / Self Care | Payer: Medicare Other | Source: Ambulatory Visit | Attending: Internal Medicine | Admitting: Internal Medicine

## 2017-11-04 DIAGNOSIS — Z1231 Encounter for screening mammogram for malignant neoplasm of breast: Secondary | ICD-10-CM | POA: Diagnosis present

## 2017-12-01 ENCOUNTER — Ambulatory Visit: Payer: Medicare Other | Admitting: Certified Registered Nurse Anesthetist

## 2017-12-01 ENCOUNTER — Ambulatory Visit
Admission: RE | Admit: 2017-12-01 | Discharge: 2017-12-01 | Disposition: A | Discharge status: Home / Self Care | Payer: Medicare Other | Source: Ambulatory Visit | Attending: Internal Medicine | Admitting: Internal Medicine

## 2017-12-01 ENCOUNTER — Encounter: Payer: Self-pay | Admitting: *Deleted

## 2017-12-01 ENCOUNTER — Encounter
Admission: RE | Disposition: A | Discharge status: Home / Self Care | Payer: Self-pay | Source: Ambulatory Visit | Attending: Internal Medicine

## 2017-12-01 DIAGNOSIS — E1143 Type 2 diabetes mellitus with diabetic autonomic (poly)neuropathy: Secondary | ICD-10-CM | POA: Insufficient documentation

## 2017-12-01 DIAGNOSIS — R1310 Dysphagia, unspecified: Secondary | ICD-10-CM | POA: Diagnosis not present

## 2017-12-01 DIAGNOSIS — K5904 Chronic idiopathic constipation: Secondary | ICD-10-CM | POA: Diagnosis present

## 2017-12-01 DIAGNOSIS — Z9981 Dependence on supplemental oxygen: Secondary | ICD-10-CM | POA: Insufficient documentation

## 2017-12-01 DIAGNOSIS — Z9049 Acquired absence of other specified parts of digestive tract: Secondary | ICD-10-CM | POA: Diagnosis not present

## 2017-12-01 DIAGNOSIS — Z794 Long term (current) use of insulin: Secondary | ICD-10-CM | POA: Diagnosis not present

## 2017-12-01 DIAGNOSIS — Z8673 Personal history of transient ischemic attack (TIA), and cerebral infarction without residual deficits: Secondary | ICD-10-CM | POA: Diagnosis not present

## 2017-12-01 DIAGNOSIS — Z90710 Acquired absence of both cervix and uterus: Secondary | ICD-10-CM | POA: Insufficient documentation

## 2017-12-01 DIAGNOSIS — K219 Gastro-esophageal reflux disease without esophagitis: Secondary | ICD-10-CM | POA: Diagnosis not present

## 2017-12-01 DIAGNOSIS — Z885 Allergy status to narcotic agent status: Secondary | ICD-10-CM | POA: Diagnosis not present

## 2017-12-01 DIAGNOSIS — K640 First degree hemorrhoids: Secondary | ICD-10-CM | POA: Insufficient documentation

## 2017-12-01 DIAGNOSIS — Z79899 Other long term (current) drug therapy: Secondary | ICD-10-CM | POA: Diagnosis not present

## 2017-12-01 DIAGNOSIS — K2950 Unspecified chronic gastritis without bleeding: Secondary | ICD-10-CM | POA: Insufficient documentation

## 2017-12-01 DIAGNOSIS — K317 Polyp of stomach and duodenum: Secondary | ICD-10-CM | POA: Insufficient documentation

## 2017-12-01 DIAGNOSIS — J45909 Unspecified asthma, uncomplicated: Secondary | ICD-10-CM | POA: Diagnosis not present

## 2017-12-01 DIAGNOSIS — Z85828 Personal history of other malignant neoplasm of skin: Secondary | ICD-10-CM | POA: Diagnosis not present

## 2017-12-01 DIAGNOSIS — I739 Peripheral vascular disease, unspecified: Secondary | ICD-10-CM | POA: Diagnosis not present

## 2017-12-01 DIAGNOSIS — K3184 Gastroparesis: Secondary | ICD-10-CM | POA: Insufficient documentation

## 2017-12-01 DIAGNOSIS — J449 Chronic obstructive pulmonary disease, unspecified: Secondary | ICD-10-CM | POA: Insufficient documentation

## 2017-12-01 DIAGNOSIS — Z87891 Personal history of nicotine dependence: Secondary | ICD-10-CM | POA: Diagnosis not present

## 2017-12-01 DIAGNOSIS — Z7982 Long term (current) use of aspirin: Secondary | ICD-10-CM | POA: Diagnosis not present

## 2017-12-01 DIAGNOSIS — I10 Essential (primary) hypertension: Secondary | ICD-10-CM | POA: Insufficient documentation

## 2017-12-01 HISTORY — PX: COLONOSCOPY WITH PROPOFOL: SHX5780

## 2017-12-01 HISTORY — PX: ESOPHAGOGASTRODUODENOSCOPY: SHX5428

## 2017-12-01 SURGERY — EGD (ESOPHAGOGASTRODUODENOSCOPY)
Anesthesia: General

## 2017-12-01 MED ORDER — PROPOFOL 500 MG/50ML IV EMUL
INTRAVENOUS | Status: AC
  Filled 2017-12-01: qty 50

## 2017-12-01 MED ORDER — MIDAZOLAM HCL 2 MG/2ML IJ SOLN
INTRAMUSCULAR | Status: DC | PRN
  Administered 2017-12-01: 2 mg via INTRAVENOUS

## 2017-12-01 MED ORDER — MIDAZOLAM HCL 2 MG/2ML IJ SOLN
INTRAMUSCULAR | Status: AC
  Filled 2017-12-01: qty 2

## 2017-12-01 MED ORDER — LIDOCAINE HCL (CARDIAC) PF 100 MG/5ML IV SOSY
PREFILLED_SYRINGE | INTRAVENOUS | Status: DC | PRN
  Administered 2017-12-01: 100 mg via INTRAVENOUS

## 2017-12-01 MED ORDER — GLYCOPYRROLATE 0.2 MG/ML IJ SOLN
INTRAMUSCULAR | Status: DC | PRN
  Administered 2017-12-01: 0.2 mg via INTRAVENOUS

## 2017-12-01 MED ORDER — SODIUM CHLORIDE 0.9 % IV SOLN
INTRAVENOUS | Status: DC
  Administered 2017-12-01: 1000 mL via INTRAVENOUS

## 2017-12-01 MED ORDER — PROPOFOL 500 MG/50ML IV EMUL
INTRAVENOUS | Status: DC | PRN
  Administered 2017-12-01: 120 ug/kg/min via INTRAVENOUS

## 2017-12-01 MED ORDER — PROPOFOL 10 MG/ML IV BOLUS
INTRAVENOUS | Status: DC | PRN
  Administered 2017-12-01: 20 mg via INTRAVENOUS
  Administered 2017-12-01: 40 mg via INTRAVENOUS
  Administered 2017-12-01 (×2): 20 mg via INTRAVENOUS

## 2017-12-01 MED ORDER — GLYCOPYRROLATE 0.2 MG/ML IJ SOLN
INTRAMUSCULAR | Status: AC
  Filled 2017-12-01: qty 1

## 2017-12-01 MED ORDER — LIDOCAINE HCL (PF) 2 % IJ SOLN
INTRAMUSCULAR | Status: AC
  Filled 2017-12-01: qty 10

## 2017-12-01 NOTE — Anesthesia Preprocedure Evaluation (Signed)
Anesthesia Evaluation  Patient identified by MRN, date of birth, ID band Patient awake    Reviewed: Allergy & Precautions, H&P , NPO status , Patient's Chart, lab work & pertinent test results  History of Anesthesia Complications Negative for: history of anesthetic complications  Airway Mallampati: III  TM Distance: <3 FB Neck ROM: limited    Dental  (+) Chipped, Poor Dentition, Missing   Pulmonary shortness of breath and with exertion, asthma , COPD,  COPD inhaler and oxygen dependent, former smoker,           Cardiovascular Exercise Tolerance: Poor hypertension, (-) angina+ Peripheral Vascular Disease  (-) Past MI      Neuro/Psych CVA negative psych ROS   GI/Hepatic negative GI ROS, Neg liver ROS,   Endo/Other  diabetes, Type 2, Insulin Dependent  Renal/GU negative Renal ROS  negative genitourinary   Musculoskeletal   Abdominal   Peds  Hematology negative hematology ROS (+)   Anesthesia Other Findings Past Medical History: No date: Asthma 2010: Cancer ([place])     Comment:  Left ear cancer No date: COPD (chronic obstructive pulmonary disease) (HCC) No date: Diabetes mellitus without complication (HCC) No date: Emphysema lung (HCC) No date: Hypertension No date: Stroke [facility])  Past Surgical History: No date: ABDOMINAL HYSTERECTOMY 1990: BREAST BIOPSY; Left     Comment:  neg No date: CHOLECYSTECTOMY No date: NECK SURGERY  BMI    Body Mass Index:  21.11 kg/m      Reproductive/Obstetrics negative OB ROS                             Anesthesia Physical Anesthesia Plan  ASA: IV  Anesthesia Plan: General   Post-op Pain Management:    Induction: Intravenous  PONV Risk Score and Plan: Propofol infusion and TIVA  Airway Management Planned: Natural Airway and Nasal Cannula  Additional Equipment:   Intra-op Plan:   Post-operative Plan:   Informed Consent: I have reviewed  the patients History and Physical, chart, labs and discussed the procedure including the risks, benefits and alternatives for the proposed anesthesia with the patient or authorized representative who has indicated his/her understanding and acceptance.   Dental Advisory Given  Plan Discussed with: Anesthesiologist, CRNA and Surgeon  Anesthesia Plan Comments: (Patient consented for risks of anesthesia including but not limited to:  - adverse reactions to medications - risk of intubation if required - damage to teeth, lips or other oral mucosa - sore throat or hoarseness - Damage to heart, brain, lungs or loss of life  Patient voiced understanding.)        Anesthesia Quick Evaluation

## 2017-12-01 NOTE — Anesthesia Procedure Notes (Signed)
Performed by: Wesleigh Markovic, CRNA Pre-anesthesia Checklist: Patient identified, Emergency Drugs available, Suction available, Patient being monitored and Timeout performed Patient Re-evaluated:Patient Re-evaluated prior to induction Oxygen Delivery Method: Simple face mask Induction Type: IV induction       

## 2017-12-01 NOTE — H&P (Signed)
Outpatient short stay form Pre-procedure 12/01/2017 8:43 AM [place] Desautel K. Alice Reichert, M.D.  Primary Physician: Glendon Axe, M.D.  Reason for visit:  Mrs. Kercheval is a 72 y/o female presenting for [place] constipation, RLQ pain, nausea and dysphagia.   History of present illness: Mrs. Hotard is a 72 y/o female presenting for [place] constipation, RLQ pain, nausea and dysphagia.  Severe GERD on UGI series from 07/2017. Gastroparesis on GE study in 08/2016.    Current Facility-Administered Medications:  .  0.9 %  sodium chloride infusion, , Intravenous, Continuous, Nakota Elsen, Benay Pike, MD  Medications Prior to Admission  Medication Sig Dispense Refill Last Dose  . acetaminophen (TYLENOL) 325 MG tablet Take 2 tablets (650 mg total) by mouth every 6 (six) hours as needed for mild pain (or Fever >/= 101).   Past Month at Unknown time  . aspirin EC 81 MG EC tablet Take 1 tablet (81 mg total) by mouth daily. (Patient taking differently: Take 162 mg by mouth daily. ) 30 tablet 0 Past Week at Unknown time  . atorvastatin (LIPITOR) 40 MG tablet Take 40 mg by mouth daily.    Past Week at Unknown time  . cholecalciferol (VITAMIN D) 1000 units tablet Take 1,000 Units by mouth daily.   Past Week at Unknown time  . clonazePAM (KLONOPIN) 1 MG tablet Take 1 mg by mouth daily as needed for anxiety.    Past Week at Unknown time  . CRANBERRY EXTRACT PO Take 4,200 mg by mouth daily.   Past Week at Unknown time  . feeding supplement, GLUCERNA SHAKE, (GLUCERNA SHAKE) LIQD Take 237 mLs by mouth 2 (two) times daily between meals. 60 Can 0 Past Week at Unknown time  . fenofibrate 160 MG tablet Take 1 tablet by mouth daily.   Past Week at Unknown time  . ferrous sulfate 325 (65 FE) MG tablet Take 325 mg by mouth daily.   Past Week at Unknown time  . Fluticasone-Salmeterol (ADVAIR DISKUS) 250-50 MCG/DOSE AEPB Inhale 1 puff into the lungs 2 (two) times daily. 60 each 0 12/01/2017 at 0700  . furosemide (LASIX) 40 MG tablet Take  40 mg by mouth daily.   Past Week at Unknown time  . ipratropium-albuterol (DUONEB) 0.5-2.5 (3) MG/3ML SOLN Take 3 mLs by nebulization every 4 (four) hours as needed. 360 mL 0 Past Week at Unknown time  . levothyroxine (SYNTHROID, LEVOTHROID) 112 MCG tablet Take 1 tablet by mouth daily.   Past Week at Unknown time  . losartan (COZAAR) 100 MG tablet Take 100 mg by mouth daily.   Past Week at Unknown time  . Magnesium 250 MG TABS Take 250 mg by mouth daily.   Past Week at Unknown time  . metFORMIN (GLUCOPHAGE) 500 MG tablet Take 500 mg by mouth daily with breakfast.   Past Week at Unknown time  . montelukast (SINGULAIR) 10 MG tablet Take 10 mg by mouth daily.  11 Past Week at Unknown time  . Omega-3 Fatty Acids (FISH OIL) 1000 MG CAPS Take 1 capsule (1,000 mg total) by mouth daily.   Past Week at Unknown time  . pantoprazole (PROTONIX) 40 MG tablet Take 1 tablet by mouth 2 (two) times daily with breakfast and lunch.   Past Week at Unknown time  . Potassium 99 MG TABS Take 198 mg by mouth daily.   Past Week at Unknown time  . pramipexole (MIRAPEX) 0.25 MG tablet Take 0.25 mg by mouth at bedtime.   Past Week at Unknown time  .  promethazine (PHENERGAN) 25 MG tablet Take 25 mg by mouth every 6 (six) hours as needed.    Past Month at Unknown time  . SPIRIVA HANDIHALER 18 MCG inhalation capsule Place 18 mcg into inhaler and inhale daily.   12/01/2017 at 0700  . sucralfate (CARAFATE) 1 g tablet Take 1 g by mouth 2 (two) times daily.   Past Week at Unknown time  . tiZANidine (ZANAFLEX) 2 MG tablet Take 2 mg by mouth 3 (three) times daily as needed. for muscle spams  0 Past Week at Unknown time  . traMADol-acetaminophen (ULTRACET) 37.5-325 MG tablet Take 1 tablet by mouth at bedtime.   Past Week at Unknown time  . traZODone (DESYREL) 150 MG tablet Take 150 mg by mouth at bedtime.  1 Past Week at Unknown time  . VENTOLIN HFA 108 (90 Base) MCG/ACT inhaler Inhale 2 puffs into the lungs every 6 (six) hours as  needed.    Past Week at Unknown time  . vitamin B-12 (CYANOCOBALAMIN) 1000 MCG tablet Take 1,000 mcg by mouth daily.   Past Week at Unknown time  . Acetaminophen-Pamabrom (CRAMP TABS PO) Take 2 tablets by mouth at bedtime.   Taking  . cephALEXin (KEFLEX) 500 MG capsule Take 1 capsule (500 mg total) by mouth every 12 (twelve) hours. (Patient not taking: Reported on 10/26/2017) 14 capsule 0 Completed Course at Unknown time  . insulin glargine (LANTUS) 100 UNIT/ML injection Inject 5 Units into the skin at bedtime.   Taking  . metoCLOPramide (REGLAN) 5 MG tablet Take 1 tablet (5 mg total) by mouth 4 (four) times daily -  before meals and at bedtime. (Patient not taking: Reported on 10/26/2017) 120 tablet 1 Not Taking     Allergies  Allergen Reactions  . Codeine Nausea Only  . Morphine And Related Nausea Only     Past Medical History:  Diagnosis Date  . Asthma   . Cancer [facility]) 2010   Left ear cancer  . COPD (chronic obstructive pulmonary disease) ([place])   . Diabetes mellitus without complication ([place])   . Emphysema lung ([place])   . Hypertension   . Stroke [facility])     Review of systems:  Otherwise negative.    Physical Exam  Gen: Alert, oriented. Appears stated age.  HEENT: [place]/AT. PERRLA. Lungs: CTA, no wheezes. CV: RR nl S1, S2. Abd: soft, benign, no masses. BS+ Ext: No edema. Pulses 2+    Planned procedures: Proceed with EGD and colonoscopy. The patient understands the nature of the planned procedure, indications, risks, alternatives and potential complications including but not limited to bleeding, infection, perforation, damage to internal organs and possible oversedation/side effects from anesthesia. The patient agrees and gives consent to proceed.  Please refer to procedure notes for findings, recommendations and patient disposition/instructions.     Lunabelle Oatley K. Alice Reichert, M.D. Gastroenterology 12/01/2017  8:43 AM

## 2017-12-01 NOTE — Op Note (Signed)
[facility] Patient Name: Kristin [name] Procedure Date: 12/01/2017 9:29 AM MRN: [id number] Account #: [id number] Date of Birth: 05/14/1945 Admit Type: Outpatient Age: 72 Room: [facility] ENDO ROOM 3 Gender: Female Note Status: Finalized Procedure:            Colonoscopy Indications:          Abdominal pain in the right lower quadrant, Change in                        bowel habits, Chronic idiopathic constipation Providers:            Benay Pike. Alice Reichert MD, MD Referring MD:         Glendon Axe (Referring MD) Medicines:            Propofol per Anesthesia Complications:        No immediate complications. Procedure:            Pre-Anesthesia Assessment:                       - The risks and benefits of the procedure and the                        sedation options and risks were discussed with the                        patient. All questions were answered and informed                        consent was obtained.                       - Patient identification and proposed procedure were                        verified prior to the procedure by the nurse. The                        procedure was verified in the procedure room.                       - ASA Grade Assessment: III - A patient with severe                        systemic disease.                       - After reviewing the risks and benefits, the patient                        was deemed in satisfactory condition to undergo the                        procedure.                       After obtaining informed consent, the colonoscope was                        passed under direct vision. Throughout the procedure,  the patient's blood pressure, pulse, and oxygen                        saturations were monitored continuously. The                        Colonoscope was introduced through the anus and                        advanced to the the cecum, identified by appendiceal                      orifice and ileocecal valve. The colonoscopy was                        performed without difficulty. The patient tolerated the                        procedure well. Findings:      The perianal and digital rectal examinations were normal. Pertinent       negatives include normal sphincter tone and no palpable rectal lesions.      The colon (entire examined portion) appeared normal.      Non-bleeding internal hemorrhoids were found during retroflexion. The       hemorrhoids were Grade I (internal hemorrhoids that do not prolapse). Impression:           - The entire examined colon is normal.                       - Non-bleeding internal hemorrhoids.                       - No specimens collected. Recommendation:       - Await pathology results from EGD, also performed                        today.                       - Patient has a contact number available for                        emergencies. The signs and symptoms of potential                        delayed complications were discussed with the patient.                        Return to normal activities tomorrow. Written discharge                        instructions were provided to the patient.                       - Resume previous diet.                       - Continue present medications.                       - No repeat colonoscopy due to age. Procedure Code(s):    --- Professional ---  45378, Colonoscopy, flexible; diagnostic, including                        collection of specimen(s) by brushing or washing, when                        performed (separate procedure) Diagnosis Code(s):    --- Professional ---                       K64.0, First degree hemorrhoids                       R10.31, Right lower quadrant pain                       R19.4, Change in bowel habit                       K59.04, Chronic idiopathic constipation CPT copyright 2017 American Medical Association. All  rights reserved. The codes documented in this report are preliminary and upon coder review may  be revised to meet current compliance requirements. Efrain Sella MD, MD 12/01/2017 10:08:30 AM This report has been signed electronically. Number of Addenda: 0 Note Initiated On: 12/01/2017 9:29 AM Scope Withdrawal Time: 0 hours 7 minutes 2 seconds  Total Procedure Duration: 0 hours 17 minutes 18 seconds       [facility]

## 2017-12-01 NOTE — Anesthesia Postprocedure Evaluation (Signed)
Anesthesia Post Note  Patient: [name]  Procedure(s) Performed: ESOPHAGOGASTRODUODENOSCOPY (EGD) (N/A ) COLONOSCOPY WITH PROPOFOL (N/A )  Patient location during evaluation: Endoscopy Anesthesia Type: General Level of consciousness: awake and alert Pain management: pain level controlled Vital Signs Assessment: post-procedure vital signs reviewed and stable Respiratory status: spontaneous breathing, nonlabored ventilation, respiratory function stable and patient connected to nasal cannula oxygen Cardiovascular status: blood pressure returned to baseline and stable Postop Assessment: no apparent nausea or vomiting Anesthetic complications: no     Last Vitals:  Vitals:   12/01/17 1009 12/01/17 1029  BP: (!) 151/64 (!) 156/84  Pulse: 73 70  Resp: (!) 21 17  Temp: (!) 36.1 C   SpO2: 100% 100%    Last Pain:  Vitals:   12/01/17 1029  TempSrc:   PainSc: 0-No pain                 Kristin [name]

## 2017-12-01 NOTE — Anesthesia Post-op Follow-up Note (Signed)
Anesthesia QCDR form completed.        

## 2017-12-01 NOTE — Op Note (Signed)
[facility] Patient Name: Kristin [name] Procedure Date: 12/01/2017 9:29 AM MRN: [id number] Account #: [id number] Date of Birth: October 03, 1945 Admit Type: Outpatient Age: 73 Room: [facility] ENDO ROOM 3 Gender: Female Note Status: Finalized Procedure:            Upper GI endoscopy Indications:          Abdominal pain in the right lower quadrant, Dysphagia Providers:            Benay Pike. Alice Reichert MD, MD Referring MD:         Glendon Axe (Referring MD) Medicines:            Propofol per Anesthesia Complications:        No immediate complications. Procedure:            Pre-Anesthesia Assessment:                       - The risks and benefits of the procedure and the                        sedation options and risks were discussed with the                        patient. All questions were answered and informed                        consent was obtained.                       - Patient identification and proposed procedure were                        verified prior to the procedure. The procedure was                        verified in the procedure room.                       - ASA Grade Assessment: III - A patient with severe                        systemic disease.                       - After reviewing the risks and benefits, the patient                        was deemed in satisfactory condition to undergo the                        procedure.                       After obtaining informed consent, the endoscope was                        passed under direct vision. Throughout the procedure,                        the patient's blood pressure, pulse, and oxygen  saturations were monitored continuously. The Endoscope                        was introduced through the mouth, and advanced to the                        third part of duodenum. The upper GI endoscopy was                        accomplished without difficulty. The patient  tolerated                        the procedure well. Findings:      No endoscopic abnormality was evident in the esophagus to explain the       patient's complaint of dysphagia.      Localized mild inflammation characterized by congestion (edema) and       erythema was found in the gastric antrum. Biopsies were taken with a       cold forceps for Helicobacter pylori testing.      A single 7 mm semi-sessile polyp with no bleeding and no stigmata of       recent bleeding was found in the cardia. Biopsies were taken with a cold       forceps for histology.      The examined duodenum was normal.      The exam was otherwise without abnormality. Impression:           - No endoscopic esophageal abnormality to explain                        patient's dysphagia.                       - Gastritis. Biopsied.                       - A single gastric polyp. Biopsied.                       - Normal examined duodenum.                       - The examination was otherwise normal. Recommendation:       - Await pathology results.                       - Proceed with colonoscopy Procedure Code(s):    --- Professional ---                       [phone], Esophagogastroduodenoscopy, flexible, transoral;                        with biopsy, single or multiple Diagnosis Code(s):    --- Professional ---                       R10.31, Right lower quadrant pain                       K31.7, Polyp of stomach and duodenum                       K29.70, Gastritis, unspecified,  without bleeding                       R13.10, Dysphagia, unspecified CPT copyright 2017 American Medical Association. All rights reserved. The codes documented in this report are preliminary and upon coder review may  be revised to meet current compliance requirements. Efrain Sella MD, MD 12/01/2017 9:43:53 AM This report has been signed electronically. Number of Addenda: 0 Note Initiated On: 12/01/2017 9:29 AM      [facility]

## 2017-12-01 NOTE — Transfer of Care (Signed)
Immediate Anesthesia Transfer of Care Note  Patient: Kristin [name]  Procedure(s) Performed: ESOPHAGOGASTRODUODENOSCOPY (EGD) (N/A ) COLONOSCOPY WITH PROPOFOL (N/A )  Patient Location: PACU  Anesthesia Type:General  Level of Consciousness: drowsy  Airway & Oxygen Therapy: Patient Spontanous Breathing and Patient connected to face mask oxygen  Post-op Assessment: Report given to RN and Post -op Vital signs reviewed and stable  Post vital signs: Reviewed and stable  Last Vitals:  Vitals Value Taken Time  BP 151/64 12/01/2017 10:08 AM  Temp    Pulse 73 12/01/2017 10:08 AM  Resp 21 12/01/2017 10:08 AM  SpO2 100 % 12/01/2017 10:08 AM  Vitals shown include unvalidated device data.  Last Pain:  Vitals:   12/01/17 0828  TempSrc: Tympanic  PainSc: 0-No pain         Complications: No apparent anesthesia complications

## 2017-12-03 LAB — SURGICAL PATHOLOGY

## 2018-01-25 ENCOUNTER — Encounter

## 2018-01-25 ENCOUNTER — Encounter (INDEPENDENT_AMBULATORY_CARE_PROVIDER_SITE_OTHER): Payer: Medicare Other

## 2018-01-25 ENCOUNTER — Ambulatory Visit (INDEPENDENT_AMBULATORY_CARE_PROVIDER_SITE_OTHER): Payer: Medicare Other | Admitting: Vascular Surgery

## 2018-01-25 ENCOUNTER — Encounter (INDEPENDENT_AMBULATORY_CARE_PROVIDER_SITE_OTHER): Payer: Self-pay

## 2018-07-26 ENCOUNTER — Other Ambulatory Visit: Payer: Self-pay | Admitting: Specialist

## 2018-07-26 DIAGNOSIS — R911 Solitary pulmonary nodule: Secondary | ICD-10-CM

## 2018-07-26 DIAGNOSIS — G4734 Idiopathic sleep related nonobstructive alveolar hypoventilation: Secondary | ICD-10-CM

## 2018-07-26 DIAGNOSIS — J449 Chronic obstructive pulmonary disease, unspecified: Secondary | ICD-10-CM

## 2018-08-04 ENCOUNTER — Ambulatory Visit
Admission: RE | Admit: 2018-08-04 | Discharge: 2018-08-04 | Disposition: A | Discharge status: Home / Self Care | Payer: Medicare Other | Source: Ambulatory Visit | Attending: Specialist | Admitting: Specialist

## 2018-08-04 ENCOUNTER — Other Ambulatory Visit: Payer: Self-pay

## 2018-08-04 DIAGNOSIS — J449 Chronic obstructive pulmonary disease, unspecified: Secondary | ICD-10-CM

## 2018-08-04 DIAGNOSIS — G4734 Idiopathic sleep related nonobstructive alveolar hypoventilation: Secondary | ICD-10-CM | POA: Diagnosis present

## 2018-08-04 DIAGNOSIS — R911 Solitary pulmonary nodule: Secondary | ICD-10-CM

## 2018-08-16 ENCOUNTER — Other Ambulatory Visit: Payer: Self-pay | Admitting: Specialist

## 2018-08-16 ENCOUNTER — Other Ambulatory Visit (HOSPITAL_COMMUNITY): Payer: Self-pay | Admitting: Specialist

## 2018-08-16 DIAGNOSIS — J449 Chronic obstructive pulmonary disease, unspecified: Secondary | ICD-10-CM

## 2018-08-16 DIAGNOSIS — R911 Solitary pulmonary nodule: Secondary | ICD-10-CM

## 2018-09-17 IMAGING — MG MM DIGITAL SCREENING BILAT W/ TOMO W/ CAD
8 series · 8 of 24 positions shown · non-contrast
Comparison: Previous exam(s).

CLINICAL DATA: Screening.

EXAM:
DIGITAL SCREENING BILATERAL MAMMOGRAM WITH TOMO AND CAD

[R MLO synth-2D]
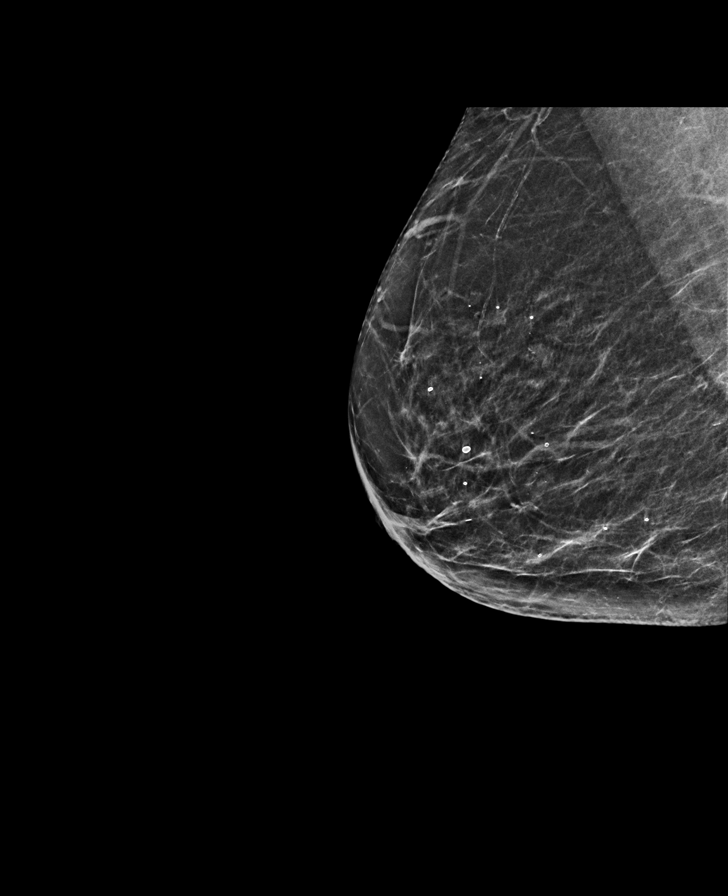

[R CC synth-2D]
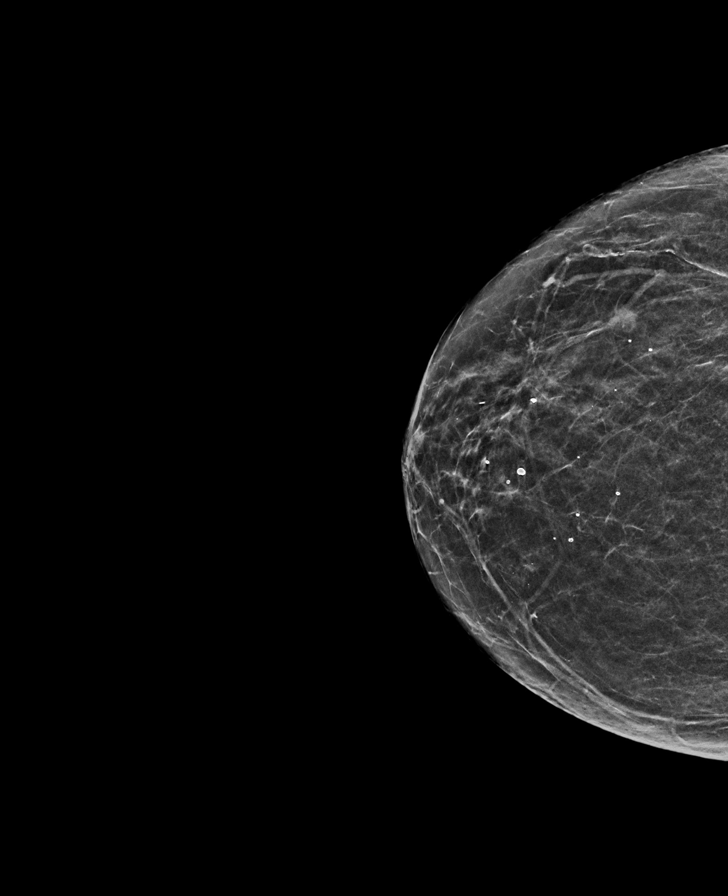

[L CC synth-2D]
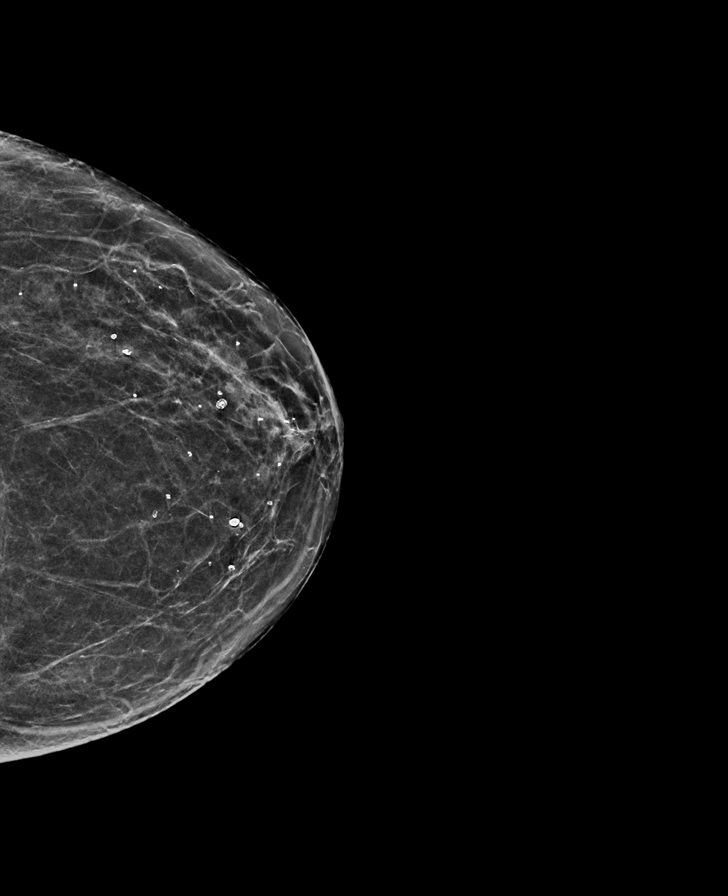

[L MLO synth-2D]
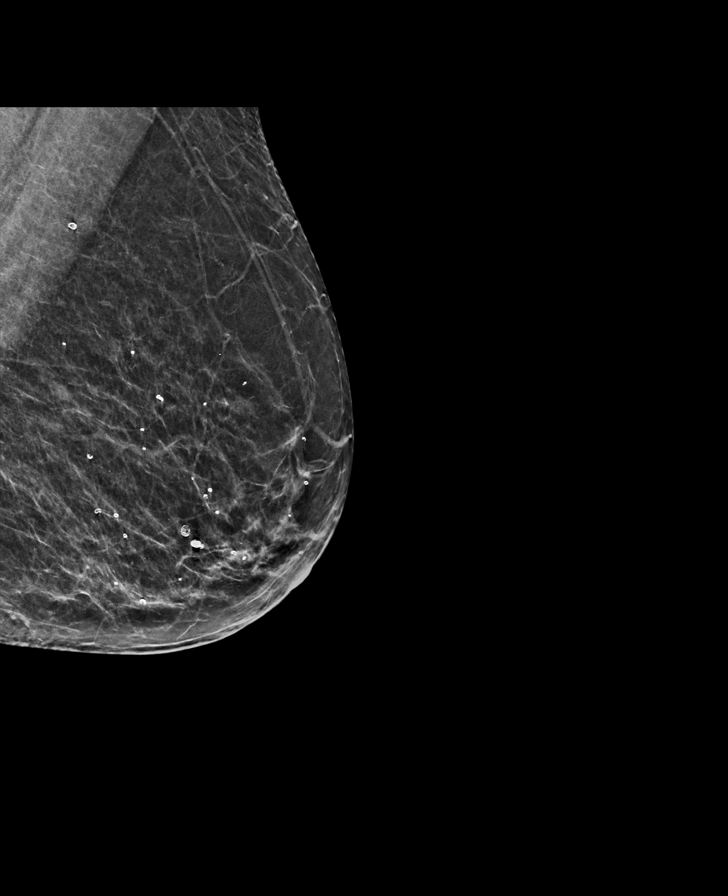

[L CC tomo · tomo slice 27/54.0]
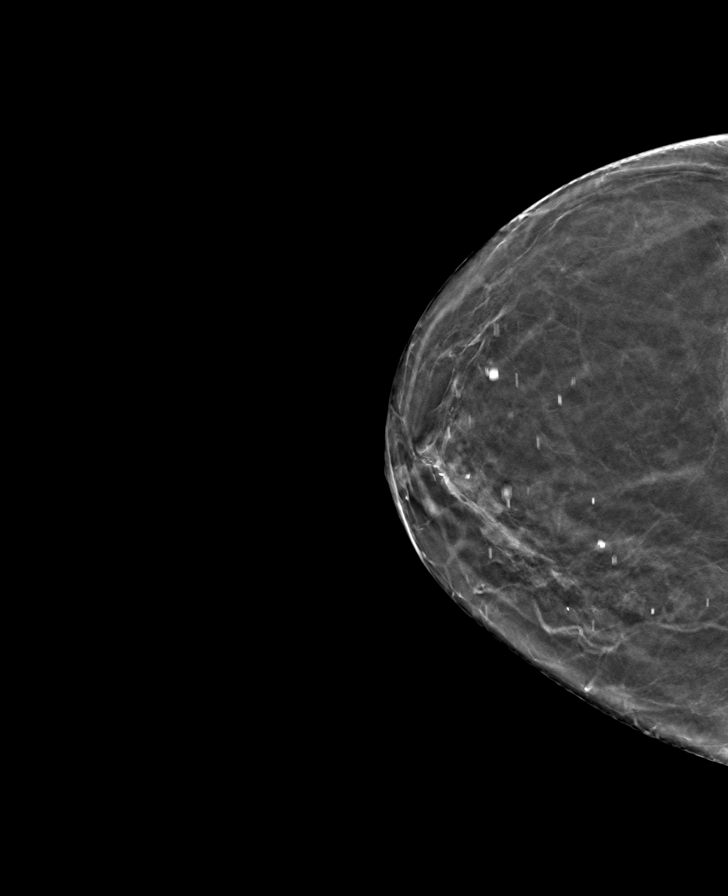

[R MLO tomo · tomo slice 29/58.0]
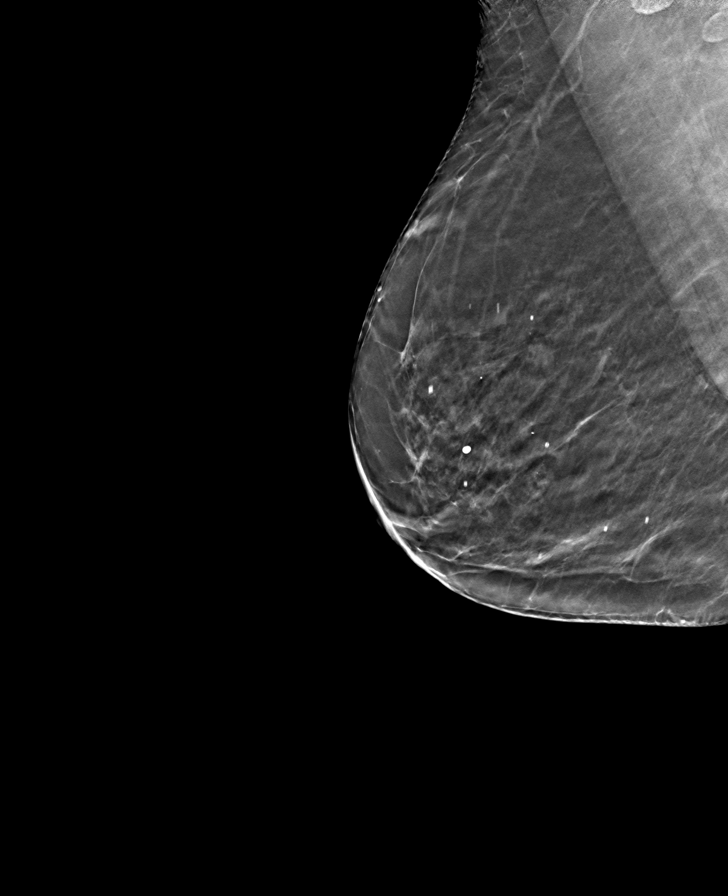

[L MLO tomo · tomo slice 29/57.0]
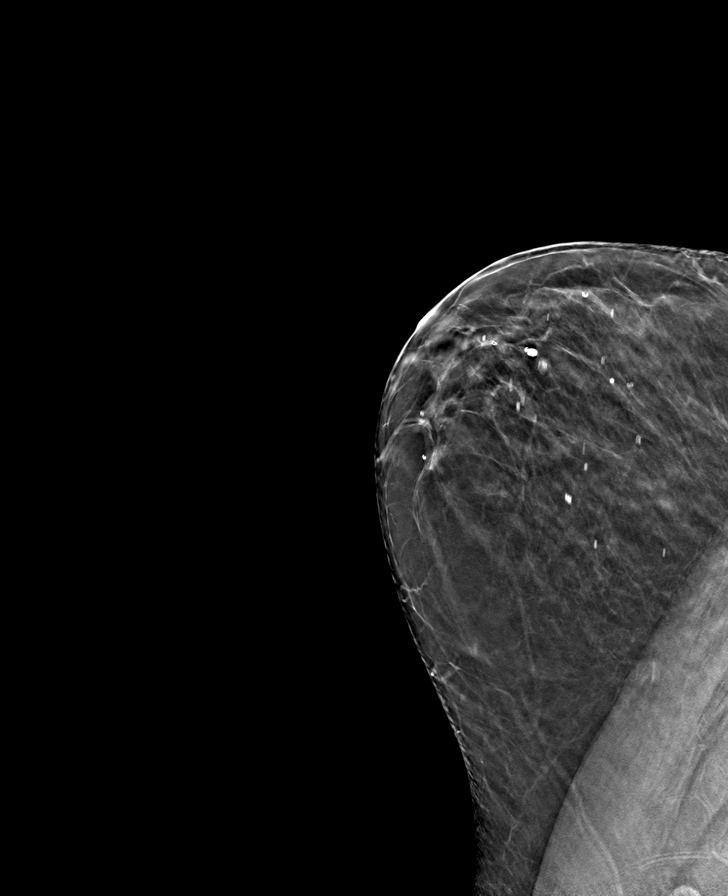

[R CC tomo · tomo slice 27/53.0]
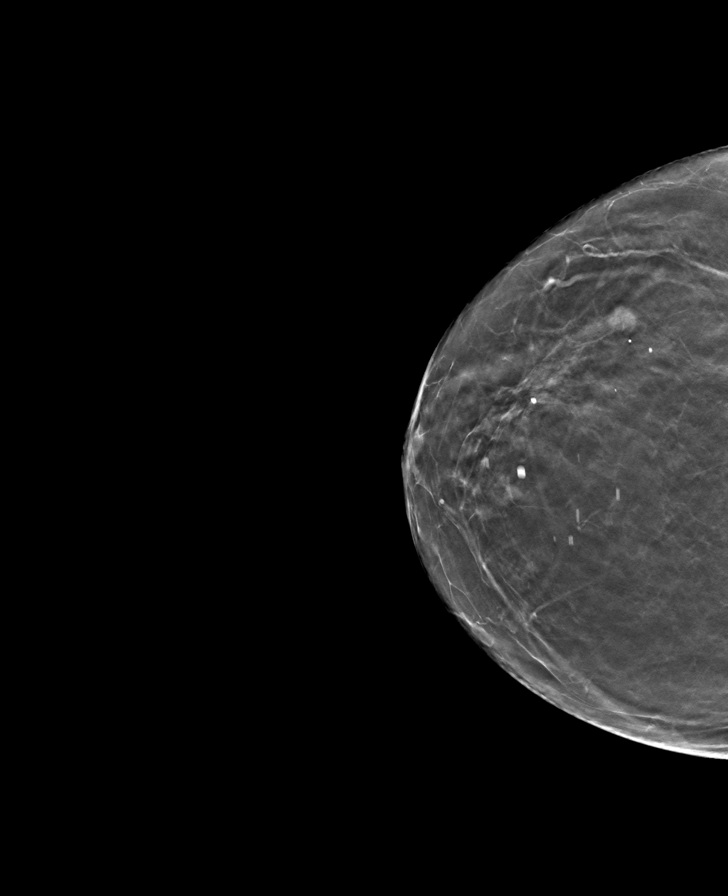

[8 of 24 positions shown; findings below may reference images not displayed]

ACR Breast Density Category b: There are scattered areas of
fibroglandular density.
FINDINGS: There are no findings suspicious for malignancy. Images were
processed with CAD.
IMPRESSION: No mammographic evidence of malignancy. A result letter of this
screening mammogram will be mailed directly to the patient.

RECOMMENDATION:
Screening mammogram in one year. (Code:[id number]-775)

BI-RADS CATEGORY  1: Negative.

## 2019-01-17 ENCOUNTER — Ambulatory Visit
Admission: RE | Admit: 2019-01-17 | Discharge: 2019-01-17 | Disposition: A | Discharge status: Home / Self Care | Payer: Medicare Other | Source: Ambulatory Visit | Attending: Specialist | Admitting: Specialist

## 2019-01-17 ENCOUNTER — Other Ambulatory Visit: Payer: Self-pay

## 2019-01-17 DIAGNOSIS — R911 Solitary pulmonary nodule: Secondary | ICD-10-CM | POA: Diagnosis present

## 2019-01-17 DIAGNOSIS — J449 Chronic obstructive pulmonary disease, unspecified: Secondary | ICD-10-CM

## 2019-04-13 ENCOUNTER — Ambulatory Visit: Payer: Medicare Other

## 2019-04-21 ENCOUNTER — Ambulatory Visit: Payer: Medicare Other | Attending: Internal Medicine

## 2019-04-21 DIAGNOSIS — Z23 Encounter for immunization: Secondary | ICD-10-CM | POA: Insufficient documentation

## 2019-04-21 NOTE — Progress Notes (Signed)
   Covid-19 Vaccination Clinic  Name:  Kristin [name]    MRN: [id number] DOB: Apr 04, 1945  04/21/2019  [name] was observed post Covid-19 immunization for 15 minutes without incidence. She was provided with Vaccine Information Sheet and instruction to access the V-Safe system.   [name] was instructed to call 911 with any severe reactions post vaccine: Marland Kitchen Difficulty breathing  . Swelling of your face and throat  . A fast heartbeat  . A bad rash all over your body  . Dizziness and weakness    Immunizations Administered    Name Date Dose VIS Date Route   Pfizer COVID-19 Vaccine 04/21/2019  8:38 AM 0.3 mL 02/25/2019 Intramuscular   Manufacturer: [place]   Lot: [id number]   [place]: [id number]

## 2019-05-16 ENCOUNTER — Ambulatory Visit: Payer: Medicare Other | Attending: Internal Medicine

## 2019-05-16 DIAGNOSIS — Z23 Encounter for immunization: Secondary | ICD-10-CM | POA: Insufficient documentation

## 2019-05-16 NOTE — Progress Notes (Signed)
   Covid-19 Vaccination Clinic  Name:  Kristin [name]    MRN: [id number] DOB: 01/29/1946  05/16/2019  Kristin [name] was observed post Covid-19 immunization for 15 minutes without incidence. She was provided with Vaccine Information Sheet and instruction to access the V-Safe system.   Kristin [name] was instructed to call 911 with any severe reactions post vaccine: Marland Kitchen Difficulty breathing  . Swelling of your face and throat  . A fast heartbeat  . A bad rash all over your body  . Dizziness and weakness    Immunizations Administered    Name Date Dose VIS Date Route   Pfizer COVID-19 Vaccine 05/16/2019 12:07 PM 0.3 mL 02/25/2019 Intramuscular   Manufacturer: [place]   Lot: [id number]   [place]: [id number]

## 2019-05-27 ENCOUNTER — Emergency Department: Payer: Medicare Other

## 2019-05-27 ENCOUNTER — Other Ambulatory Visit: Payer: Self-pay

## 2019-05-27 ENCOUNTER — Emergency Department
Admission: EM | Admit: 2019-05-27 | Discharge: 2019-05-27 | Disposition: A | Discharge status: Home / Self Care | Payer: Medicare Other | Attending: Emergency Medicine | Admitting: Emergency Medicine

## 2019-05-27 DIAGNOSIS — Z794 Long term (current) use of insulin: Secondary | ICD-10-CM | POA: Diagnosis not present

## 2019-05-27 DIAGNOSIS — I10 Essential (primary) hypertension: Secondary | ICD-10-CM | POA: Insufficient documentation

## 2019-05-27 DIAGNOSIS — Z85828 Personal history of other malignant neoplasm of skin: Secondary | ICD-10-CM | POA: Insufficient documentation

## 2019-05-27 DIAGNOSIS — Z7982 Long term (current) use of aspirin: Secondary | ICD-10-CM | POA: Diagnosis not present

## 2019-05-27 DIAGNOSIS — J441 Chronic obstructive pulmonary disease with (acute) exacerbation: Secondary | ICD-10-CM | POA: Diagnosis not present

## 2019-05-27 DIAGNOSIS — E119 Type 2 diabetes mellitus without complications: Secondary | ICD-10-CM | POA: Insufficient documentation

## 2019-05-27 DIAGNOSIS — Z9049 Acquired absence of other specified parts of digestive tract: Secondary | ICD-10-CM | POA: Diagnosis not present

## 2019-05-27 DIAGNOSIS — R0602 Shortness of breath: Secondary | ICD-10-CM | POA: Diagnosis present

## 2019-05-27 DIAGNOSIS — Z8673 Personal history of transient ischemic attack (TIA), and cerebral infarction without residual deficits: Secondary | ICD-10-CM | POA: Insufficient documentation

## 2019-05-27 DIAGNOSIS — U071 COVID-19: Secondary | ICD-10-CM | POA: Diagnosis not present

## 2019-05-27 DIAGNOSIS — Z87891 Personal history of nicotine dependence: Secondary | ICD-10-CM | POA: Diagnosis not present

## 2019-05-27 DIAGNOSIS — Z79899 Other long term (current) drug therapy: Secondary | ICD-10-CM | POA: Insufficient documentation

## 2019-05-27 HISTORY — DX: COVID-19: U07.1

## 2019-05-27 LAB — COMPREHENSIVE METABOLIC PANEL
ALT: 22 U/L (ref 0–44)
AST: 20 U/L (ref 15–41)
Albumin: 4.2 g/dL (ref 3.5–5.0)
Alkaline Phosphatase: 42 U/L (ref 38–126)
Anion gap: 9 (ref 5–15)
BUN: 17 mg/dL (ref 8–23)
CO2: 28 mmol/L (ref 22–32)
Calcium: 8.8 mg/dL — ABNORMAL LOW (ref 8.9–10.3)
Chloride: 102 mmol/L (ref 98–111)
Creatinine, Ser: 0.96 mg/dL (ref 0.44–1.00)
GFR calc Af Amer: >60 mL/min (ref >60)
GFR calc non Af Amer: 59 mL/min — ABNORMAL LOW (ref >60)
Glucose, Bld: 119 mg/dL — ABNORMAL HIGH (ref 70–99)
Potassium: 3.8 mmol/L (ref 3.5–5.1)
Sodium: 139 mmol/L (ref 135–145)
Total Bilirubin: 0.4 mg/dL (ref 0.3–1.2)
Total Protein: 6.5 g/dL (ref 6.5–8.1)

## 2019-05-27 LAB — CBC
HCT: 34.8 % — ABNORMAL LOW (ref 36.0–46.0)
Hemoglobin: 10.9 g/dL — ABNORMAL LOW (ref 12.0–15.0)
MCH: 30.4 pg (ref 26.0–34.0)
MCHC: 31.3 g/dL (ref 30.0–36.0)
MCV: 97.2 fL (ref 80.0–100.0)
Platelets: 175 10*3/uL (ref 150–400)
RBC: 3.58 MIL/uL — ABNORMAL LOW (ref 3.87–5.11)
RDW: 12.9 % (ref 11.5–15.5)
WBC: 4.5 10*3/uL (ref 4.0–10.5)
nRBC: 0 % (ref 0.0–0.2)

## 2019-05-27 LAB — BRAIN NATRIURETIC PEPTIDE: B Natriuretic Peptide: 74 pg/mL (ref 0.0–100.0)

## 2019-05-27 LAB — TROPONIN I (HIGH SENSITIVITY)
Troponin I (High Sensitivity): 6 ng/L (ref <18)
Troponin I (High Sensitivity): 5 ng/L (ref <18)

## 2019-05-27 LAB — GLUCOSE, CAPILLARY: Glucose-Capillary: 163 mg/dL — ABNORMAL HIGH (ref 70–99)

## 2019-05-27 LAB — RESPIRATORY PANEL BY RT PCR (FLU A&B, COVID)
Influenza A by PCR: NEGATIVE
Influenza B by PCR: NEGATIVE
SARS Coronavirus 2 by RT PCR: POSITIVE — AB

## 2019-05-27 MED ORDER — IPRATROPIUM-ALBUTEROL 0.5-2.5 (3) MG/3ML IN SOLN: 3.0000 mL | Freq: Once | RESPIRATORY_TRACT | Status: DC

## 2019-05-27 MED ORDER — IPRATROPIUM-ALBUTEROL 0.5-2.5 (3) MG/3ML IN SOLN
3.0000 mL | Freq: Once | RESPIRATORY_TRACT | Status: AC
  Administered 2019-05-27: 19:00:00 3 mL via RESPIRATORY_TRACT
  Filled 2019-05-27: qty 3

## 2019-05-27 MED ORDER — METHYLPREDNISOLONE SODIUM SUCC 125 MG IJ SOLR
125.0000 mg | Freq: Once | INTRAMUSCULAR | Status: AC
  Administered 2019-05-27: 125 mg via INTRAVENOUS
  Filled 2019-05-27: qty 2

## 2019-05-27 MED ORDER — PREDNISONE 20 MG PO TABS: 60.0000 mg | ORAL_TABLET | Freq: Every day | ORAL | 0 refills | Status: DC

## 2019-05-27 MED ORDER — IPRATROPIUM-ALBUTEROL 0.5-2.5 (3) MG/3ML IN SOLN
3.0000 mL | Freq: Once | RESPIRATORY_TRACT | Status: AC
  Administered 2019-05-27: 3 mL via RESPIRATORY_TRACT
  Filled 2019-05-27: qty 3

## 2019-05-27 MED ORDER — ALBUTEROL SULFATE (2.5 MG/3ML) 0.083% IN NEBU
5.0000 mg | INHALATION_SOLUTION | Freq: Once | RESPIRATORY_TRACT | Status: DC
  Filled 2019-05-27: qty 6

## 2019-05-27 NOTE — Discharge Instructions (Addendum)
Take the prednisone as prescribed and finish the full 5-day course.  Continue to use your albuterol either by inhaler or nebulizer every 4-6 hours.  Return to the ER for new, worsening, or persistent severe shortness of breath, chest pain, fever, weakness, or any other new or worsening symptoms that concern you.

## 2019-05-27 NOTE — ED Notes (Signed)
RT at bedside.

## 2019-05-27 NOTE — ED Notes (Signed)
Pt tolerating bipap well at this time. Denies any needs.

## 2019-05-27 NOTE — ED Notes (Signed)
Dr Cherylann Banas notified of positive covid result. Order to take pt off bipap and place back on 4L [place].  Pt SpO2 98-99% on 4L  at this time

## 2019-05-27 NOTE — ED Notes (Signed)
Pt tolerating 4L [place] well. 98% SpO2. RR even & unlabored

## 2019-05-27 NOTE — ED Notes (Signed)
See triage note, pt states she has had increased SHOB that started this week. Pt increased RR, labored breathing, speaking in short sentences.  Denies fevers, chest pain

## 2019-05-27 NOTE — ED Triage Notes (Addendum)
Pt arrives from home via pov. Pt states all week she has been feeling bad and getting worse, pt is labored to breathe, reports always on 4L but breathing is worse. But states she checked her pulse ox at home and it was good. Pt is breathing 36 times a min in triage, pt also was diagnosed with a uti on Wednesday and started an antibiotic. Pt received first covid vaccine 04-19-19 and 2nd 05-18-19

## 2019-05-27 NOTE — ED Provider Notes (Signed)
[facility] Emergency Department Provider Note ____________________________________________   First MD Initiated Contact with Patient 05/27/19 1855     (approximate)  I have reviewed the triage vital signs and the nursing notes.   HISTORY  Chief Complaint Shortness of Breath and Urinary Tract Infection    HPI Kristin [name] is a 74 y.o. female with PMH as noted below including COPD and asthma who presents with shortness of breath, gradual onset over the last week, and associated with some nonproductive cough.  Patient states that she is normally on 4 L by nasal cannula.  She reports some heaviness in her chest but denies pain.  She has no fever or chills.  She also states that she was recently diagnosed with a UTI and is taking an antibiotic, but has no dysuria or other symptoms with this.  Past Medical History:  Diagnosis Date  . Asthma   . 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 COPD (chronic obstructive pulmonary disease) ([place])   . Diabetes mellitus without complication ([place])   . Emphysema lung ([place])   . Hypertension   . Stroke [facility])     Patient Active Problem List   Diagnosis Date Noted  . Carotid stenosis 10/26/2017  . Diabetes ([place]) 10/26/2017  . Essential hypertension 10/26/2017  . COPD (chronic obstructive pulmonary disease) ([place]) 10/26/2017  . Pyelonephritis 09/02/2017  . UTI (urinary tract infection) 11/30/2016  . Encephalopathy acute 11/30/2016  . Chronic respiratory failure with hypoxia ([place]) 11/30/2016  . Weakness generalized 11/30/2016  . Acute encephalopathy 11/30/2016  . Acute respiratory failure ([place]) 05/23/2016  . Malnutrition of moderate degree 05/23/2016  . Syncope 10/23/2015    Past Surgical History:  Procedure Laterality Date  . ABDOMINAL HYSTERECTOMY    . BREAST BIOPSY Left 1990   neg  . CHOLECYSTECTOMY    . COLONOSCOPY WITH PROPOFOL N/A 12/01/2017   Procedure: COLONOSCOPY WITH PROPOFOL;  Surgeon: Toledo,  Benay Pike, MD;  Location: [facility] ENDOSCOPY;  Service: Gastroenterology;  Laterality: N/A;  . ESOPHAGOGASTRODUODENOSCOPY N/A 12/01/2017   Procedure: ESOPHAGOGASTRODUODENOSCOPY (EGD);  Surgeon: Toledo, Benay Pike, MD;  Location: [facility] ENDOSCOPY;  Service: Gastroenterology;  Laterality: N/A;  . NECK SURGERY      Prior to Admission medications   Medication Sig Start Date End Date Taking? Authorizing Provider  acetaminophen (TYLENOL) 325 MG tablet Take 2 tablets (650 mg total) by mouth every 6 (six) hours as needed for mild pain (or Fever >/= 101). 09/04/17   Gouru, Aruna, MD  Acetaminophen-Pamabrom (CRAMP TABS PO) Take 2 tablets by mouth at bedtime.    [provider]  aspirin EC 81 MG EC tablet Take 1 tablet (81 mg total) by mouth daily. Patient taking differently: Take 162 mg by mouth daily.  10/24/15   Loletha Grayer, MD  atorvastatin (LIPITOR) 40 MG tablet Take 40 mg by mouth daily.     [provider]  cephALEXin (KEFLEX) 500 MG capsule Take 1 capsule (500 mg total) by mouth every 12 (twelve) hours. Patient not taking: Reported on 10/26/2017 09/04/17   Nicholes Mango, MD  cholecalciferol (VITAMIN D) 1000 units tablet Take 1,000 Units by mouth daily.    [provider]  clonazePAM (KLONOPIN) 1 MG tablet Take 1 mg by mouth daily as needed for anxiety.     [provider]  CRANBERRY EXTRACT PO Take 4,200 mg by mouth daily.    [provider]  feeding supplement, GLUCERNA SHAKE, (GLUCERNA SHAKE) LIQD Take 237 mLs by  mouth 2 (two) times daily between meals. 09/04/17   Nicholes Mango, MD  fenofibrate 160 MG tablet Take 1 tablet by mouth daily. 10/06/15   [provider]  ferrous sulfate 325 (65 FE) MG tablet Take 325 mg by mouth daily.    [provider]  Fluticasone-Salmeterol (ADVAIR DISKUS) 250-50 MCG/DOSE AEPB Inhale 1 puff into the lungs 2 (two) times daily. 05/26/16   Gladstone Lighter, MD  furosemide (LASIX) 40 MG tablet Take 40 mg by mouth  daily. 08/03/15   [provider]  insulin glargine (LANTUS) 100 UNIT/ML injection Inject 5 Units into the skin at bedtime.    [provider]  ipratropium-albuterol (DUONEB) 0.5-2.5 (3) MG/3ML SOLN Take 3 mLs by nebulization every 4 (four) hours as needed. 05/26/16   Gladstone Lighter, MD  levothyroxine (SYNTHROID, LEVOTHROID) 112 MCG tablet Take 1 tablet by mouth daily. 09/15/15   [provider]  losartan (COZAAR) 100 MG tablet Take 100 mg by mouth daily.    [provider]  Magnesium 250 MG TABS Take 250 mg by mouth daily.    [provider]  metFORMIN (GLUCOPHAGE) 500 MG tablet Take 500 mg by mouth daily with breakfast.    [provider]  metoCLOPramide (REGLAN) 5 MG tablet Take 1 tablet (5 mg total) by mouth 4 (four) times daily -  before meals and at bedtime. Patient not taking: Reported on 10/26/2017 09/04/17   Nicholes Mango, MD  montelukast (SINGULAIR) 10 MG tablet Take 10 mg by mouth daily. 06/06/17   [provider]  Omega-3 Fatty Acids (FISH OIL) 1000 MG CAPS Take 1 capsule (1,000 mg total) by mouth daily. 09/04/17   Gouru, Illene Silver, MD  pantoprazole (PROTONIX) 40 MG tablet Take 1 tablet by mouth 2 (two) times daily with breakfast and lunch. 10/20/15   [provider]  Potassium 99 MG TABS Take 198 mg by mouth daily.    [provider]  pramipexole (MIRAPEX) 0.25 MG tablet Take 0.25 mg by mouth at bedtime. 05/04/16   [provider]  predniSONE (DELTASONE) 20 MG tablet Take 3 tablets (60 mg total) by mouth daily for 5 days. 05/27/19 06/01/19  Arta Silence, MD  promethazine (PHENERGAN) 25 MG tablet Take 25 mg by mouth every 6 (six) hours as needed.  09/14/15   [provider]  SPIRIVA HANDIHALER 18 MCG inhalation capsule Place 18 mcg into inhaler and inhale daily. 05/13/16   [provider]  sucralfate (CARAFATE) 1 g tablet Take 1 g by mouth 2 (two) times daily.    [provider]    tiZANidine (ZANAFLEX) 2 MG tablet Take 2 mg by mouth 3 (three) times daily as needed. for muscle spams 08/14/17   [provider]  traMADol-acetaminophen (ULTRACET) 37.5-325 MG tablet Take 1 tablet by mouth at bedtime.    [provider]  traZODone (DESYREL) 150 MG tablet Take 150 mg by mouth at bedtime. 08/14/17   [provider]  VENTOLIN HFA 108 (90 Base) MCG/ACT inhaler Inhale 2 puffs into the lungs every 6 (six) hours as needed.  10/11/15   [provider]  vitamin B-12 (CYANOCOBALAMIN) 1000 MCG tablet Take 1,000 mcg by mouth daily.    [provider]    Allergies Codeine and Morphine and related  Family History  Problem Relation Age of Onset  . Ovarian cancer Mother   . Lung cancer Father   . Breast cancer Neg Hx     Social History Social History   Tobacco  Use  . Smoking status: Former Smoker  . Smokeless tobacco: Never Used  Substance Use Topics  . Alcohol use: No  . Drug use: No    Review of Systems  Constitutional: No fever/chills. Eyes: No redness. ENT: No sore throat. Cardiovascular: Denies chest pain. Respiratory: Positive for shortness of breath. Gastrointestinal: No vomiting or diarrhea.  Genitourinary: Negative for dysuria.  Musculoskeletal: Negative for back pain. Skin: Negative for rash. Neurological: Negative for headache.   ____________________________________________   PHYSICAL EXAM:  VITAL SIGNS: ED Triage Vitals  Enc Vitals Group     BP 05/27/19 1837 (!) 161/83     Pulse Rate 05/27/19 1837 81     Resp 05/27/19 1837 (!) 36     Temp 05/27/19 1837 98.5 F (36.9 C)     Temp Source 05/27/19 1837 Oral     SpO2 05/27/19 1837 96 %     Weight 05/27/19 1838 124 lb (56.2 kg)     Height 05/27/19 1838 5\' 4"  (1.626 m)     Head Circumference --      Peak Flow --      Pain Score 05/27/19 1838 8     Pain Loc --      Pain Edu? --      Excl. in [place]? --     Constitutional: Alert and oriented.   Uncomfortable appearing, labored breathing, speaking short sentences. Eyes: Conjunctivae are normal.  Head: Atraumatic. Nose: No congestion/rhinnorhea. Mouth/Throat: Mucous membranes are moist.   Neck: Normal range of motion.  Cardiovascular: Normal rate, regular rhythm. Grossly normal heart sounds.  Good peripheral circulation. Respiratory: Increased respiratory effort with accessory muscle use.  Decreased breath sounds bilaterally with some scattered wheezes/rhonchi. Gastrointestinal: Soft and nontender. No distention.  Genitourinary: No flank tenderness. Musculoskeletal: No lower extremity edema.  Extremities warm and well perfused.  Neurologic:  Normal speech and language. No gross focal neurologic deficits are appreciated.  Skin:  Skin is warm and dry. No rash noted. Psychiatric: Mood and affect are normal. Speech and behavior are normal.  ____________________________________________   LABS (all labs ordered are listed, but only abnormal results are displayed)  Labs Reviewed  RESPIRATORY PANEL BY RT PCR (FLU A&B, COVID) - Abnormal; Notable for the following components:      Result Value   SARS Coronavirus 2 by RT PCR POSITIVE (*)    All other components within normal limits  CBC - Abnormal; Notable for the following components:   RBC 3.58 (*)    Hemoglobin 10.9 (*)    HCT 34.8 (*)    All other components within normal limits  COMPREHENSIVE METABOLIC PANEL - Abnormal; Notable for the following components:   Glucose, Bld 119 (*)    Calcium 8.8 (*)    GFR calc non Af Amer 59 (*)    All other components within normal limits  GLUCOSE, CAPILLARY - Abnormal; Notable for the following components:   Glucose-Capillary 163 (*)    All other components within normal limits  BRAIN NATRIURETIC PEPTIDE  URINALYSIS, COMPLETE (UACMP) WITH MICROSCOPIC  TROPONIN I (HIGH SENSITIVITY)  TROPONIN I (HIGH SENSITIVITY)   ____________________________________________  EKG  ED ECG REPORT I,  Arta Silence, the attending physician, personally viewed and interpreted this ECG.  Date: 05/27/2019 EKG Time: 1856 Rate: 82 Rhythm: normal sinus rhythm QRS Axis: Left axis Intervals: Incomplete RBBB ST/T Wave abnormalities: normal Narrative Interpretation: no evidence of acute ischemia  ____________________________________________  RADIOLOGY  CXR: No focal infiltrate or other acute abnormality  ____________________________________________  PROCEDURES  Procedure(s) performed: No  Procedures  Critical Care performed: Yes  CRITICAL CARE Performed by: Arta Silence   Total critical care time: 20 minutes  Critical care time was exclusive of separately billable procedures and treating other patients.  Critical care was necessary to treat or prevent imminent or life-threatening deterioration.  Critical care was time spent personally by me on the following activities: development of treatment plan with patient and/or surrogate as well as nursing, discussions with consultants, evaluation of patient's response to treatment, examination of patient, obtaining history from patient or surrogate, ordering and performing treatments and interventions, ordering and review of laboratory studies, ordering and review of radiographic studies, pulse oximetry and re-evaluation of patient's condition. ____________________________________________   INITIAL IMPRESSION / ASSESSMENT AND PLAN / ED COURSE  Pertinent labs & imaging results that were available during my care of the patient were reviewed by me and considered in my medical decision making (see chart for details).  74 year old female with PMH as noted above including a history of COPD presents with gradual worsening shortness of breath over the last week not associated with fever or chills.  The patient reports some heaviness in her chest but no pain.  She also reports recently being diagnosed with a UTI and is being treated  with an antibiotic but has no dysuria.  I reviewed the past medical records in [place].  Patient was most recently admitted in 2019 with pyelonephritis.  On exam, the patient is uncomfortable appearing, with labored breathing and tachypnea around the mid 30s.  She is speaking in short sentences.  O2 saturation is 100% on 4 L.  She is hypertensive with otherwise normal vital signs.  She has decreased breath sounds with some wheezing bilaterally.  EKG is nonischemic.  Overall presentation is most consistent with COPD exacerbation.  Differential includes acute pneumonia, bronchitis, COVID-19, or less likely new onset CHF or ACS.  We will obtain a chest x-ray, lab work-up, give Solu-Medrol and bronchodilators and reassess.  Given the patient's labored breathing and accessory muscle use, although she is oxygenating well I will put her on BiPAP.  ----------------------------------------- 9:46 PM on 05/27/2019 -----------------------------------------  After a relatively short time, we were able to switch the patient off of the BiPAP and back onto nasal cannula.  Now on reassessment, she appears comfortable and has no increased work of breathing or respiratory distress on her normal 4 L by nasal cannula.  The chest x-ray shows no focal infiltrate or edema.  The lab work-up is otherwise unremarkable except for a positive COVID-19 PCR.  The patient has already had both doses of the vaccine, the second being on 3/3.  Overall I suspect that the patient is experiencing a COPD exacerbation, and essentially having an asymptomatic COVID-19 infection status post vaccination, infection since the vaccine itself does not cause a false positive PCR.  The patient feels much better and wants to go home.  Given that her O2 saturation is 100% on her normal 4 L and she has no increased work of breathing at this time, I feel that this is reasonable.  I counseled the patient on the results of the work-up.  I recommended that  she use albuterol every 4-6 hours and do a prednisone burst course.  She agrees with this plan.  I gave her thorough return precautions and she expressed understanding.  _____________________________  Kristin [name] was evaluated in Emergency Department on 05/27/2019 for the symptoms described in the history of present illness. She was  evaluated in the context of the global COVID-19 pandemic, which necessitated consideration that the patient might be at risk for infection with the SARS-CoV-2 virus that causes COVID-19. Institutional protocols and algorithms that pertain to the evaluation of patients at risk for COVID-19 are in a state of rapid change based on information released by regulatory bodies including the CDC and federal and state organizations. These policies and algorithms were followed during the patient's care in the ED.  ____________________________________________   FINAL CLINICAL IMPRESSION(S) / ED DIAGNOSES  Final diagnoses:  COPD exacerbation ([place])      NEW MEDICATIONS STARTED DURING THIS VISIT:  New Prescriptions   PREDNISONE (DELTASONE) 20 MG TABLET    Take 3 tablets (60 mg total) by mouth daily for 5 days.     Note:  This document was prepared using Dragon voice recognition software and may include unintentional dictation errors.    Arta Silence, MD 05/27/19 2150

## 2019-05-27 NOTE — ED Notes (Signed)
Pt breathing even and unlabored on 4L [place], pt reports she wears 4L [place] at home continuously

## 2019-05-28 ENCOUNTER — Telehealth: Payer: Self-pay | Admitting: Unknown Physician Specialty

## 2019-05-28 NOTE — Telephone Encounter (Signed)
Called to discuss with patient about Covid symptoms and the use of bamlanivimab, a monoclonal antibody infusion for those with mild to moderate Covid symptoms and at a high risk of hospitalization.  Pt is qualified for this infusion at the [facility] infusion center due to Age > 79 and comorbid conditions

## 2019-05-28 NOTE — Telephone Encounter (Signed)
Called to discuss with [name] about Covid symptoms and the use of bamlanivimab, a monoclonal antibody infusion for those with mild to moderate Covid symptoms and at a high risk of hospitalization.     Pt does not qualify for infusion therapy as  symptoms first presented > 10 days prior to timing of infusion. Symptoms tier reviewed as well as criteria for ending isolation. Preventative practices reviewed. Patient verbalized understanding   Patient Active Problem List   Diagnosis Date Noted  . Carotid stenosis 10/26/2017  . Diabetes ([place]) 10/26/2017  . Essential hypertension 10/26/2017  . COPD (chronic obstructive pulmonary disease) ([place]) 10/26/2017  . Pyelonephritis 09/02/2017  . UTI (urinary tract infection) 11/30/2016  . Encephalopathy acute 11/30/2016  . Chronic respiratory failure with hypoxia ([place]) 11/30/2016  . Weakness generalized 11/30/2016  . Acute encephalopathy 11/30/2016  . Acute respiratory failure ([place]) 05/23/2016  . Malnutrition of moderate degree 05/23/2016  . Syncope 10/23/2015

## 2019-05-30 ENCOUNTER — Other Ambulatory Visit: Payer: Self-pay

## 2019-05-30 ENCOUNTER — Inpatient Hospital Stay (HOSPITAL_COMMUNITY)
Admission: EM | Admit: 2019-05-30 | Discharge: 2019-06-03 | DRG: 177 | Disposition: A | Discharge status: Home Health | Payer: Medicare Other | Attending: Internal Medicine | Admitting: Internal Medicine

## 2019-05-30 ENCOUNTER — Emergency Department (HOSPITAL_COMMUNITY): Payer: Medicare Other

## 2019-05-30 ENCOUNTER — Encounter (HOSPITAL_COMMUNITY): Payer: Self-pay

## 2019-05-30 DIAGNOSIS — Z79890 Hormone replacement therapy (postmenopausal): Secondary | ICD-10-CM

## 2019-05-30 DIAGNOSIS — J449 Chronic obstructive pulmonary disease, unspecified: Secondary | ICD-10-CM | POA: Diagnosis present

## 2019-05-30 DIAGNOSIS — Z7951 Long term (current) use of inhaled steroids: Secondary | ICD-10-CM

## 2019-05-30 DIAGNOSIS — Z7982 Long term (current) use of aspirin: Secondary | ICD-10-CM | POA: Diagnosis not present

## 2019-05-30 DIAGNOSIS — Z7984 Long term (current) use of oral hypoglycemic drugs: Secondary | ICD-10-CM

## 2019-05-30 DIAGNOSIS — E785 Hyperlipidemia, unspecified: Secondary | ICD-10-CM | POA: Diagnosis present

## 2019-05-30 DIAGNOSIS — U071 COVID-19: Principal | ICD-10-CM | POA: Diagnosis present

## 2019-05-30 DIAGNOSIS — J9600 Acute respiratory failure, unspecified whether with hypoxia or hypercapnia: Secondary | ICD-10-CM | POA: Diagnosis present

## 2019-05-30 DIAGNOSIS — E119 Type 2 diabetes mellitus without complications: Secondary | ICD-10-CM

## 2019-05-30 DIAGNOSIS — I10 Essential (primary) hypertension: Secondary | ICD-10-CM | POA: Diagnosis present

## 2019-05-30 DIAGNOSIS — Z90710 Acquired absence of both cervix and uterus: Secondary | ICD-10-CM | POA: Diagnosis not present

## 2019-05-30 DIAGNOSIS — Z9981 Dependence on supplemental oxygen: Secondary | ICD-10-CM | POA: Diagnosis not present

## 2019-05-30 DIAGNOSIS — Z888 Allergy status to other drugs, medicaments and biological substances status: Secondary | ICD-10-CM

## 2019-05-30 DIAGNOSIS — Z801 Family history of malignant neoplasm of trachea, bronchus and lung: Secondary | ICD-10-CM

## 2019-05-30 DIAGNOSIS — Z8673 Personal history of transient ischemic attack (TIA), and cerebral infarction without residual deficits: Secondary | ICD-10-CM | POA: Diagnosis not present

## 2019-05-30 DIAGNOSIS — Z8041 Family history of malignant neoplasm of ovary: Secondary | ICD-10-CM | POA: Diagnosis not present

## 2019-05-30 DIAGNOSIS — Z9049 Acquired absence of other specified parts of digestive tract: Secondary | ICD-10-CM | POA: Diagnosis not present

## 2019-05-30 DIAGNOSIS — N183 Chronic kidney disease, stage 3 unspecified: Secondary | ICD-10-CM | POA: Diagnosis not present

## 2019-05-30 DIAGNOSIS — F419 Anxiety disorder, unspecified: Secondary | ICD-10-CM | POA: Diagnosis present

## 2019-05-30 DIAGNOSIS — J439 Emphysema, unspecified: Secondary | ICD-10-CM | POA: Diagnosis present

## 2019-05-30 DIAGNOSIS — Z87891 Personal history of nicotine dependence: Secondary | ICD-10-CM

## 2019-05-30 DIAGNOSIS — E1165 Type 2 diabetes mellitus with hyperglycemia: Secondary | ICD-10-CM | POA: Diagnosis not present

## 2019-05-30 DIAGNOSIS — Z79899 Other long term (current) drug therapy: Secondary | ICD-10-CM

## 2019-05-30 DIAGNOSIS — T380X5A Adverse effect of glucocorticoids and synthetic analogues, initial encounter: Secondary | ICD-10-CM | POA: Diagnosis not present

## 2019-05-30 DIAGNOSIS — J9621 Acute and chronic respiratory failure with hypoxia: Secondary | ICD-10-CM | POA: Diagnosis present

## 2019-05-30 DIAGNOSIS — Z885 Allergy status to narcotic agent status: Secondary | ICD-10-CM

## 2019-05-30 DIAGNOSIS — Y92239 Unspecified place in hospital as the place of occurrence of the external cause: Secondary | ICD-10-CM | POA: Diagnosis not present

## 2019-05-30 DIAGNOSIS — J9601 Acute respiratory failure with hypoxia: Secondary | ICD-10-CM | POA: Diagnosis present

## 2019-05-30 DIAGNOSIS — J441 Chronic obstructive pulmonary disease with (acute) exacerbation: Secondary | ICD-10-CM | POA: Diagnosis not present

## 2019-05-30 DIAGNOSIS — E1122 Type 2 diabetes mellitus with diabetic chronic kidney disease: Secondary | ICD-10-CM | POA: Diagnosis not present

## 2019-05-30 DIAGNOSIS — R531 Weakness: Secondary | ICD-10-CM

## 2019-05-30 LAB — COMPREHENSIVE METABOLIC PANEL
ALT: 16 U/L (ref 0–44)
AST: 13 U/L — ABNORMAL LOW (ref 15–41)
Albumin: 3.7 g/dL (ref 3.5–5.0)
Alkaline Phosphatase: 37 U/L — ABNORMAL LOW (ref 38–126)
Anion gap: 12 (ref 5–15)
BUN: 13 mg/dL (ref 8–23)
CO2: 25 mmol/L (ref 22–32)
Calcium: 8.9 mg/dL (ref 8.9–10.3)
Chloride: 103 mmol/L (ref 98–111)
Creatinine, Ser: 1.04 mg/dL — ABNORMAL HIGH (ref 0.44–1.00)
GFR calc Af Amer: >60 mL/min (ref >60)
GFR calc non Af Amer: 53 mL/min — ABNORMAL LOW (ref >60)
Glucose, Bld: 185 mg/dL — ABNORMAL HIGH (ref 70–99)
Potassium: 3.9 mmol/L (ref 3.5–5.1)
Sodium: 140 mmol/L (ref 135–145)
Total Bilirubin: 0.7 mg/dL (ref 0.3–1.2)
Total Protein: 6.1 g/dL — ABNORMAL LOW (ref 6.5–8.1)

## 2019-05-30 LAB — CBC WITH DIFFERENTIAL/PLATELET
Abs Immature Granulocytes: 0.05 10*3/uL (ref 0.00–0.07)
Basophils Absolute: 0 10*3/uL (ref 0.0–0.1)
Basophils Relative: 0 %
Eosinophils Absolute: 0.1 10*3/uL (ref 0.0–0.5)
Eosinophils Relative: 1 %
HCT: 35 % — ABNORMAL LOW (ref 36.0–46.0)
Hemoglobin: 11.1 g/dL — ABNORMAL LOW (ref 12.0–15.0)
Immature Granulocytes: 1 %
Lymphocytes Relative: 24 %
Lymphs Abs: 1.9 10*3/uL (ref 0.7–4.0)
MCH: 30.8 pg (ref 26.0–34.0)
MCHC: 31.7 g/dL (ref 30.0–36.0)
MCV: 97.2 fL (ref 80.0–100.0)
Monocytes Absolute: 0.7 10*3/uL (ref 0.1–1.0)
Monocytes Relative: 8 %
Neutro Abs: 5.3 10*3/uL (ref 1.7–7.7)
Neutrophils Relative %: 66 %
Platelets: 180 10*3/uL (ref 150–400)
RBC: 3.6 MIL/uL — ABNORMAL LOW (ref 3.87–5.11)
RDW: 12.6 % (ref 11.5–15.5)
WBC: 8 10*3/uL (ref 4.0–10.5)
nRBC: 0 % (ref 0.0–0.2)

## 2019-05-30 LAB — LACTATE DEHYDROGENASE: LDH: 116 U/L (ref 98–192)

## 2019-05-30 LAB — LACTIC ACID, PLASMA: Lactic Acid, Venous: 1.1 mmol/L (ref 0.5–1.9)

## 2019-05-30 LAB — FIBRINOGEN: Fibrinogen: 378 mg/dL (ref 210–475)

## 2019-05-30 LAB — FERRITIN: Ferritin: 48 ng/mL (ref 11–307)

## 2019-05-30 LAB — TRIGLYCERIDES: Triglycerides: 162 mg/dL — ABNORMAL HIGH (ref <150)

## 2019-05-30 LAB — D-DIMER, QUANTITATIVE: D-Dimer, Quant: 0.79 ug/mL-FEU — ABNORMAL HIGH (ref 0.00–0.50)

## 2019-05-30 LAB — PROCALCITONIN: Procalcitonin: <0.1 ng/mL

## 2019-05-30 LAB — C-REACTIVE PROTEIN: CRP: 1.6 mg/dL — ABNORMAL HIGH (ref <1.0)

## 2019-05-30 MED ORDER — SODIUM CHLORIDE 0.9 % IV SOLN
100.0000 mg | INTRAVENOUS | Status: AC
  Administered 2019-05-30 (×2): 100 mg via INTRAVENOUS
  Filled 2019-05-30 (×2): qty 20

## 2019-05-30 MED ORDER — SODIUM CHLORIDE 0.9 % IV SOLN
1000.0000 mL | INTRAVENOUS | Status: DC
  Administered 2019-05-30: 1000 mL via INTRAVENOUS

## 2019-05-30 MED ORDER — SODIUM CHLORIDE 0.9 % IV SOLN
100.0000 mg | Freq: Every day | INTRAVENOUS | Status: AC
  Administered 2019-05-31 – 2019-06-03 (×4): 100 mg via INTRAVENOUS
  Filled 2019-05-30 (×8): qty 20

## 2019-05-30 NOTE — ED Notes (Addendum)
Report given to Mike, RN. All questions answered.

## 2019-05-30 NOTE — ED Provider Notes (Signed)
[facility] EMERGENCY DEPARTMENT Provider Note   CSN: [id number] Arrival date & time: 05/30/19  2045     History Chief Complaint  Patient presents with  . Respiratory Distress    Kristin [name] is a 74 y.o. female.  Pt presents to the ED today with sob.  Pt has been sob for the past few days.  She went to the ED at [facility] on 05/27/19 and tested positive for Covid.  The pt has a hx of COPD and is on 4L normally.  Sob has worsened.  EMS was called tonight and put pt on CPAP.  She was given 2 g Magnesium, 125 mg solumedrol, 0.6 mg Epi, and 2 duonebs.  Pt is still sob.        Past Medical History:  Diagnosis Date  . Asthma   . 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 COPD (chronic obstructive pulmonary disease) ([place])   . Diabetes mellitus without complication ([place])   . Emphysema lung ([place])   . Hypertension   . Stroke [facility])     Patient Active Problem List   Diagnosis Date Noted  . Acute respiratory failure due to COVID-19 ([place]) 05/30/2019  . Carotid stenosis 10/26/2017  . Diabetes ([place]) 10/26/2017  . Essential hypertension 10/26/2017  . COPD (chronic obstructive pulmonary disease) ([place]) 10/26/2017  . Pyelonephritis 09/02/2017  . UTI (urinary tract infection) 11/30/2016  . Encephalopathy acute 11/30/2016  . Chronic respiratory failure with hypoxia ([place]) 11/30/2016  . Weakness generalized 11/30/2016  . Acute encephalopathy 11/30/2016  . Acute respiratory failure ([place]) 05/23/2016  . Malnutrition of moderate degree 05/23/2016  . Syncope 10/23/2015    Past Surgical History:  Procedure Laterality Date  . ABDOMINAL HYSTERECTOMY    . BREAST BIOPSY Left 1990   neg  . CHOLECYSTECTOMY    . COLONOSCOPY WITH PROPOFOL N/A 12/01/2017   Procedure: COLONOSCOPY WITH PROPOFOL;  Surgeon: Toledo, Benay Pike, MD;  Location: [facility] ENDOSCOPY;  Service: Gastroenterology;  Laterality: N/A;  . ESOPHAGOGASTRODUODENOSCOPY N/A 12/01/2017   Procedure:  ESOPHAGOGASTRODUODENOSCOPY (EGD);  Surgeon: Toledo, Benay Pike, MD;  Location: [facility] ENDOSCOPY;  Service: Gastroenterology;  Laterality: N/A;  . NECK SURGERY       OB History   No obstetric history on file.     Family History  Problem Relation Age of Onset  . Ovarian cancer Mother   . Lung cancer Father   . Breast cancer Neg Hx     Social History   Tobacco Use  . Smoking status: Former Research scientist (life sciences)  . Smokeless tobacco: Never Used  Substance Use Topics  . Alcohol use: No  . Drug use: No    Home Medications Prior to Admission medications   Medication Sig Start Date End Date Taking? Authorizing Provider  acetaminophen (TYLENOL) 325 MG tablet Take 2 tablets (650 mg total) by mouth every 6 (six) hours as needed for mild pain (or Fever >/= 101). 09/04/17  Yes Gouru, Aruna, MD  albuterol (PROVENTIL) (2.5 MG/3ML) 0.083% nebulizer solution Inhale 1 mL into the lungs every 6 (six) hours as needed for after feeds. 12/24/18  Yes [provider]  amoxicillin (AMOXIL) 875 MG tablet Take 875 mg by mouth 2 (two) times daily. 05/26/19 06/02/19 Yes [provider]  aspirin EC 81 MG EC tablet Take 1 tablet (81 mg total) by mouth daily. Patient taking differently: Take 162 mg by mouth daily.  10/24/15  Yes Wieting, Richard, MD  atorvastatin (LIPITOR) 40 MG tablet Take 40 mg  by mouth daily.    Yes [provider]  bisacodyl (DULCOLAX) 10 MG suppository Place 10 mg rectally as needed for mild constipation or moderate constipation.   Yes [provider]  bisacodyl (FLEET) 10 MG/30ML ENEM Place 10 mg rectally once as needed (for constipation).   Yes [provider]  clonazePAM (KLONOPIN) 1 MG tablet Take 1 mg by mouth in the morning.    Yes [provider]  CRANBERRY EXTRACT PO Take 15,000 mg by mouth every morning.   Yes [provider]  Cyanocobalamin (VITAMIN B-12) 3000 MCG SUBL Place 3,000 mcg under the tongue daily with breakfast.   Yes [provider]  dimenhyDRINATE (DRAMAMINE) 50 MG tablet Take 50 mg by mouth every 8 (eight) hours as needed for nausea.   Yes [provider]  fenofibrate 160 MG tablet Take 160 mg by mouth daily.  10/06/15  Yes [provider]  fexofenadine (ALLEGRA) 180 MG tablet Take 180 mg by mouth daily.   Yes [provider]  fluticasone (FLONASE) 50 MCG/ACT nasal spray Place 1-2 sprays into both nostrils in the morning.   Yes [provider]  fluticasone-salmeterol (ADVAIR HFA) 230-21 MCG/ACT inhaler Inhale 2 puffs into the lungs 2 (two) times daily.   Yes [provider]  ipratropium-albuterol (DUONEB) 0.5-2.5 (3) MG/3ML SOLN Take 3 mLs by nebulization every 4 (four) hours as needed. Patient taking differently: Take 3 mLs by nebulization every 4 (four) hours as needed (for wheezing or shortness of breath).  05/26/16  Yes Gladstone Lighter, MD  levothyroxine (SYNTHROID, LEVOTHROID) 112 MCG tablet Take 112 mcg by mouth daily before breakfast.  09/15/15  Yes [provider]  losartan (COZAAR) 100 MG tablet Take 100 mg by mouth daily.   Yes [provider]  Magnesium 250 MG TABS Take 250 mg by mouth daily.   Yes [provider]  metFORMIN (GLUCOPHAGE) 500 MG tablet Take 500 mg by mouth 2 (two) times daily with a meal.    Yes [provider]  montelukast (SINGULAIR) 10 MG tablet Take 10 mg by mouth at bedtime.  06/06/17  Yes [provider]  omega-3 acid ethyl esters (LOVAZA) 1 g capsule Take 1 g by mouth daily with breakfast.   Yes [provider]  OXYGEN Inhale 4 L/min into the lungs continuous.    Yes [provider]  pantoprazole (PROTONIX) 40 MG tablet Take 1 tablet by mouth 2 (two) times daily with breakfast and lunch. 10/20/15  Yes [provider]  polyethylene glycol powder (GLYCOLAX/MIRALAX) 17 GM/SCOOP powder Take 17 g by mouth daily as needed. 4-8 10/27/17  Yes [provider]    Potassium 99 MG TABS Take 198 mg by mouth daily with breakfast.   Yes [provider]  pramipexole (MIRAPEX) 0.5 MG tablet Take 1 mg by mouth at bedtime.   Yes [provider]  SPIRIVA HANDIHALER 18 MCG inhalation capsule Place 18 mcg into inhaler and inhale daily. 05/13/16  Yes [provider]  traZODone (DESYREL) 150 MG tablet Take 150 mg by mouth at bedtime. 08/14/17  Yes [provider]  cephALEXin (KEFLEX) 500 MG capsule Take 1 capsule (500 mg total) by mouth every 12 (twelve) hours. Patient not taking: Reported on 05/30/2019 09/04/17   Nicholes Mango, MD  feeding supplement, GLUCERNA SHAKE, (GLUCERNA SHAKE) LIQD Take 237 mLs by mouth 2 (two) times daily between meals. Patient not taking: Reported on 05/30/2019 09/04/17   Nicholes Mango, MD  Fluticasone-Salmeterol (ADVAIR  DISKUS) 250-50 MCG/DOSE AEPB Inhale 1 puff into the lungs 2 (two) times daily. Patient not taking: Reported on 05/30/2019 05/26/16   Gladstone Lighter, MD  metoCLOPramide (REGLAN) 5 MG tablet Take 1 tablet (5 mg total) by mouth 4 (four) times daily -  before meals and at bedtime. 09/04/17   Nicholes Mango, MD  Omega-3 Fatty Acids (FISH OIL) 1000 MG CAPS Take 1 capsule (1,000 mg total) by mouth daily. 09/04/17   Gouru, Illene Silver, MD  predniSONE (DELTASONE) 20 MG tablet Take 3 tablets (60 mg total) by mouth daily for 5 days. Patient not taking: Reported on 05/30/2019 05/27/19 06/01/19  Arta Silence, MD  promethazine (PHENERGAN) 25 MG tablet Take 25 mg by mouth every 6 (six) hours as needed.  09/14/15   [provider]  traMADol-acetaminophen (ULTRACET) 37.5-325 MG tablet Take 1 tablet by mouth at bedtime.    [provider]  VENTOLIN HFA 108 (90 Base) MCG/ACT inhaler Inhale 2 puffs into the lungs every 6 (six) hours as needed.  10/11/15   [provider]  vitamin B-12 (CYANOCOBALAMIN) 1000 MCG tablet Take 1,000 mcg by mouth daily.    [provider]    Allergies     Codeine, Morphine and related, and Prednisone  Review of Systems   Review of Systems  Respiratory: Positive for shortness of breath.   All other systems reviewed and are negative.   Physical Exam Updated Vital Signs BP (!) 162/79   Pulse 84   Temp 98.9 F (37.2 C) (Oral)   Resp 20   SpO2 100%   Physical Exam Vitals and nursing note reviewed.  Constitutional:      General: She is in acute distress.     Appearance: Normal appearance. She is toxic-appearing.  HENT:     Head: Normocephalic and atraumatic.     Right Ear: External ear normal.     Left Ear: External ear normal.     Nose: Nose normal.     Mouth/Throat:     Mouth: Mucous membranes are moist.     Pharynx: Oropharynx is clear.  Eyes:     Extraocular Movements: Extraocular movements intact.     Conjunctiva/sclera: Conjunctivae normal.     Pupils: Pupils are equal, round, and reactive to light.  Cardiovascular:     Rate and Rhythm: Normal rate and regular rhythm.  Pulmonary:     Effort: Respiratory distress present.     Breath sounds: Wheezing present.  Abdominal:     General: Abdomen is flat. Bowel sounds are normal.     Palpations: Abdomen is soft.  Musculoskeletal:        General: Normal range of motion.     Cervical back: Normal range of motion and neck supple.  Skin:    General: Skin is warm.     Capillary Refill: Capillary refill takes less than 2 seconds.  Neurological:     General: No focal deficit present.     Mental Status: She is alert and oriented to person, place, and time.  Psychiatric:        Mood and Affect: Mood normal.        Behavior: Behavior normal.        Thought Content: Thought content normal.        Judgment: Judgment normal.     ED Results / Procedures / Treatments   Labs (all labs ordered are listed, but only abnormal results are displayed) Labs Reviewed  CBC WITH DIFFERENTIAL/PLATELET - Abnormal; Notable for the  following components:      Result Value   RBC 3.60 (*)     Hemoglobin 11.1 (*)    HCT 35.0 (*)    All other components within normal limits  COMPREHENSIVE METABOLIC PANEL - Abnormal; Notable for the following components:   Glucose, Bld 185 (*)    Creatinine, Ser 1.04 (*)    Total Protein 6.1 (*)    AST 13 (*)    Alkaline Phosphatase 37 (*)    GFR calc non Af Amer 53 (*)    All other components within normal limits  D-DIMER, QUANTITATIVE (NOT AT [facility]) - Abnormal; Notable for the following components:   D-Dimer, Quant 0.79 (*)    All other components within normal limits  TRIGLYCERIDES - Abnormal; Notable for the following components:   Triglycerides 162 (*)    All other components within normal limits  C-REACTIVE PROTEIN - Abnormal; Notable for the following components:   CRP 1.6 (*)    All other components within normal limits  CULTURE, BLOOD (ROUTINE X 2)  CULTURE, BLOOD (ROUTINE X 2)  LACTIC ACID, PLASMA  LACTATE DEHYDROGENASE  FERRITIN  FIBRINOGEN  LACTIC ACID, PLASMA  PROCALCITONIN    EKG None  Radiology DG Chest Port 1 View  Result Date: 05/30/2019 CLINICAL DATA:  74 year old female with respiratory distress. Positive COVID-19. EXAM: PORTABLE CHEST 1 VIEW COMPARISON:  Chest radiograph dated 05/27/2019. FINDINGS: Background of emphysema. No focal consolidation, pleural effusion, pneumothorax. The cardiac silhouette is within normal limits. Atherosclerotic calcification of the aorta. No acute osseous pathology. Cervical ACDF. IMPRESSION: 1. No acute cardiopulmonary process. 2. Emphysema. Electronically Signed   By: Anner Crete M.D.   On: 05/30/2019 21:04    Procedures Procedures (including critical care time)  Medications Ordered in ED Medications  0.9 %  sodium chloride infusion (1,000 mLs Intravenous New Bag/Given 05/30/19 2152)    ED Course  I have reviewed the triage vital signs and the nursing notes.  Pertinent labs & imaging results that were available during my care of the patient were reviewed by me and  considered in my medical decision making (see chart for details).    MDM Rules/Calculators/A&P                      Pt requests holding off on intubation for as long as possible.    She was placed on high flow O2 at 10 L.  She is still tachypneic, but is looking more comfortable.    Pt was d/w Dr. Jonelle Sidle (triad) for admission.  Kristin [name] was evaluated in Emergency Department on 05/30/2019 for the symptoms described in the history of present illness. She was evaluated in the context of the global COVID-19 pandemic, which necessitated consideration that the patient might be at risk for infection with the SARS-CoV-2 virus that causes COVID-19. Institutional protocols and algorithms that pertain to the evaluation of patients at risk for COVID-19 are in a state of rapid change based on information released by regulatory bodies including the CDC and federal and state organizations. These policies and algorithms were followed during the patient's care in the ED.   CRITICAL CARE Performed by: Isla Pence   Total critical care time: 30 minutes  Critical care time was exclusive of separately billable procedures and treating other patients.  Critical care was necessary to treat or prevent imminent or life-threatening deterioration.  Critical care was time spent personally by me on the following activities: development of treatment plan with  patient and/or surrogate as well as nursing, discussions with consultants, evaluation of patient's response to treatment, examination of patient, obtaining history from patient or surrogate, ordering and performing treatments and interventions, ordering and review of laboratory studies, ordering and review of radiographic studies, pulse oximetry and re-evaluation of patient's condition.  Final Clinical Impression(s) / ED Diagnoses Final diagnoses:  Acute respiratory failure with hypoxia ([place])  COVID-19 virus infection    Rx / DC Orders ED  Discharge Orders    None       Isla Pence, MD 05/30/19 2223

## 2019-05-30 NOTE — ED Notes (Signed)
email sent to ed leadership that husband called asking about guidelines for isolation and quarantine.  I called him, and explained isolation and quarantine guidelines.  He is very worried about his wife and says that she is high risk.  I advised that he call pcp.  He said that they canceled her appt today.  I told him to call them back and ask about an e visit.  I told him that if she becomes very ill or short of breath she can always return here to be see in person.

## 2019-05-30 NOTE — H&P (Signed)
History and Physical   Kristin [name] [id number] DOB: 07-17-1945 DOA: 05/30/2019  Referring MD/NP/PA: Dr. Gilford Raid  PCP: Glendon Axe, MD   Outpatient Specialists: None  Patient coming from: Home  Chief Complaint: Shortness of breath  HPI: Kristin [name] is a 74 y.o. female with medical history significant of COPD, diabetes, hyperlipidemia, hypertension, previous CVA who was diagnosed with COVID-19 on Friday 3 days ago after symptoms that started 5 days ago.  She is normally on 4 L of oxygen at home.  Patient came in tachypneic with oxygen sats dropping.  Her heart rate was in the 50s.  She was initially placed on CPAP of on arrival.  Also given Solu-Medrol epinephrine duo nebs and magnesium by EMS.  In the ER she continues to be significantly tachypneic.  Patient is currently on high flow oxygen of 30 L and looking comfortable.  She refuses intubation in the beginning but indicates that she is willing to be intubated if no other options available.  She is currently stabilizing.  We are admitting her to progressive care with acute respiratory failure.  Patient's chest x-ray showed no infiltrates.  She is appears to be having a flareup of her COPD at this point probably exacerbated by the COVID-19 infection.  No fever.  Denied any significant cough or hemoptysis.  ED Course: Temperature is 98.9 blood pressure 134/94 pulse 92 respiratory of 22 oxygen sat 88% on 4 L currently 98% on high flow oxygen.  White count 8.0 hemoglobin 11.1 platelets 180.  Creatinine 1.04 otherwise initial chemistries within normal.  Procalcitonin is less than 0.1.  Glucose 185 lactic acid 1.1.  CRP 1.6 ferritin 48.  Triglyceride 162.  Blood cultures obtained.  Chest x-ray showed chronic changes with no acute abnormalities.  Patient is being admitted to the hospital at this point with acute respiratory failure.  Review of Systems: As per HPI otherwise 10 point review of systems negative.    Past Medical  History:  Diagnosis Date  . Asthma   . Cancer [facility]) 2010   Left ear cancer  . COPD (chronic obstructive pulmonary disease) ([place])   . Diabetes mellitus without complication ([place])   . Emphysema lung ([place])   . Hypertension   . Stroke [facility])     Past Surgical History:  Procedure Laterality Date  . ABDOMINAL HYSTERECTOMY    . BREAST BIOPSY Left 1990   neg  . CHOLECYSTECTOMY    . COLONOSCOPY WITH PROPOFOL N/A 12/01/2017   Procedure: COLONOSCOPY WITH PROPOFOL;  Surgeon: Toledo, Benay Pike, MD;  Location: [facility] ENDOSCOPY;  Service: Gastroenterology;  Laterality: N/A;  . ESOPHAGOGASTRODUODENOSCOPY N/A 12/01/2017   Procedure: ESOPHAGOGASTRODUODENOSCOPY (EGD);  Surgeon: Toledo, Benay Pike, MD;  Location: [facility] ENDOSCOPY;  Service: Gastroenterology;  Laterality: N/A;  . NECK SURGERY       reports that she has quit smoking. She has never used smokeless tobacco. She reports that she does not drink alcohol or use drugs.  Allergies  Allergen Reactions  . Codeine Nausea Only  . Morphine And Related Nausea Only  . Prednisone Other (See Comments)    Elevated BGL into the 300's    Family History  Problem Relation Age of Onset  . Ovarian cancer Mother   . Lung cancer Father   . Breast cancer Neg Hx      Prior to Admission medications   Medication Sig Start Date End Date Taking? Authorizing Provider  acetaminophen (TYLENOL) 325 MG tablet Take 2 tablets (650 mg total) by mouth every  6 (six) hours as needed for mild pain (or Fever >/= 101). 09/04/17  Yes Gouru, Aruna, MD  albuterol (PROVENTIL) (2.5 MG/3ML) 0.083% nebulizer solution Inhale 1 mL into the lungs every 6 (six) hours as needed for after feeds. 12/24/18  Yes [provider]  amoxicillin (AMOXIL) 875 MG tablet Take 875 mg by mouth 2 (two) times daily. 05/26/19 06/02/19 Yes [provider]  aspirin EC 81 MG EC tablet Take 1 tablet (81 mg total) by mouth daily. Patient taking differently: Take 162 mg by mouth daily.  10/24/15   Yes Wieting, Richard, MD  atorvastatin (LIPITOR) 40 MG tablet Take 40 mg by mouth daily.    Yes [provider]  bisacodyl (DULCOLAX) 10 MG suppository Place 10 mg rectally as needed for mild constipation or moderate constipation.   Yes [provider]  bisacodyl (FLEET) 10 MG/30ML ENEM Place 10 mg rectally once as needed (for constipation).   Yes [provider]  clonazePAM (KLONOPIN) 1 MG tablet Take 1 mg by mouth in the morning.    Yes [provider]  CRANBERRY EXTRACT PO Take 15,000 mg by mouth every morning.   Yes [provider]  Cyanocobalamin (VITAMIN B-12) 3000 MCG SUBL Place 3,000 mcg under the tongue daily with breakfast.   Yes [provider]  dimenhyDRINATE (DRAMAMINE) 50 MG tablet Take 50 mg by mouth every 8 (eight) hours as needed for nausea.   Yes [provider]  fenofibrate 160 MG tablet Take 160 mg by mouth daily.  10/06/15  Yes [provider]  fexofenadine (ALLEGRA) 180 MG tablet Take 180 mg by mouth daily.   Yes [provider]  fluticasone (FLONASE) 50 MCG/ACT nasal spray Place 1-2 sprays into both nostrils in the morning.   Yes [provider]  fluticasone-salmeterol (ADVAIR HFA) 230-21 MCG/ACT inhaler Inhale 2 puffs into the lungs 2 (two) times daily.   Yes [provider]  ipratropium-albuterol (DUONEB) 0.5-2.5 (3) MG/3ML SOLN Take 3 mLs by nebulization every 4 (four) hours as needed. Patient taking differently: Take 3 mLs by nebulization every 4 (four) hours as needed (for wheezing or shortness of breath).  05/26/16  Yes Gladstone Lighter, MD  levothyroxine (SYNTHROID, LEVOTHROID) 112 MCG tablet Take 112 mcg by mouth daily before breakfast.  09/15/15  Yes [provider]  losartan (COZAAR) 100 MG tablet Take 100 mg by mouth daily.   Yes [provider]  Magnesium 250 MG TABS Take 250 mg by mouth daily.   Yes [provider]  metFORMIN (GLUCOPHAGE) 500  MG tablet Take 500 mg by mouth 2 (two) times daily with a meal.    Yes [provider]  montelukast (SINGULAIR) 10 MG tablet Take 10 mg by mouth at bedtime.  06/06/17  Yes [provider]  omega-3 acid ethyl esters (LOVAZA) 1 g capsule Take 1 g by mouth daily with breakfast.   Yes [provider]  OXYGEN Inhale 4 L/min into the lungs continuous.    Yes [provider]  pantoprazole (PROTONIX) 40 MG tablet Take 1 tablet by mouth 2 (two) times daily with breakfast and lunch. 10/20/15  Yes [provider]  polyethylene glycol powder (GLYCOLAX/MIRALAX) 17 GM/SCOOP powder Take 17 g by mouth daily as needed. 4-8 10/27/17  Yes [provider]  Potassium 99 MG TABS Take 198 mg by mouth daily with breakfast.   Yes [provider]  pramipexole (MIRAPEX) 0.5 MG tablet Take 1 mg by mouth at bedtime.  Yes [provider]  SPIRIVA HANDIHALER 18 MCG inhalation capsule Place 18 mcg into inhaler and inhale daily. 05/13/16  Yes [provider]  traZODone (DESYREL) 150 MG tablet Take 150 mg by mouth at bedtime. 08/14/17  Yes [provider]  cephALEXin (KEFLEX) 500 MG capsule Take 1 capsule (500 mg total) by mouth every 12 (twelve) hours. Patient not taking: Reported on 05/30/2019 09/04/17   Nicholes Mango, MD  feeding supplement, GLUCERNA SHAKE, (GLUCERNA SHAKE) LIQD Take 237 mLs by mouth 2 (two) times daily between meals. Patient not taking: Reported on 05/30/2019 09/04/17   Nicholes Mango, MD  Fluticasone-Salmeterol (ADVAIR DISKUS) 250-50 MCG/DOSE AEPB Inhale 1 puff into the lungs 2 (two) times daily. Patient not taking: Reported on 05/30/2019 05/26/16   Gladstone Lighter, MD  metoCLOPramide (REGLAN) 5 MG tablet Take 1 tablet (5 mg total) by mouth 4 (four) times daily -  before meals and at bedtime. 09/04/17   Nicholes Mango, MD  Omega-3 Fatty Acids (FISH OIL) 1000 MG CAPS Take 1 capsule (1,000 mg total) by mouth daily. 09/04/17   Gouru, Illene Silver,  MD  predniSONE (DELTASONE) 20 MG tablet Take 3 tablets (60 mg total) by mouth daily for 5 days. Patient not taking: Reported on 05/30/2019 05/27/19 06/01/19  Arta Silence, MD  promethazine (PHENERGAN) 25 MG tablet Take 25 mg by mouth every 6 (six) hours as needed.  09/14/15   [provider]  sucralfate (CARAFATE) 1 g tablet Take 1 g by mouth 2 (two) times daily.    [provider]  tiZANidine (ZANAFLEX) 2 MG tablet Take 2 mg by mouth 3 (three) times daily as needed. for muscle spams 08/14/17   [provider]  traMADol-acetaminophen (ULTRACET) 37.5-325 MG tablet Take 1 tablet by mouth at bedtime.    [provider]  VENTOLIN HFA 108 (90 Base) MCG/ACT inhaler Inhale 2 puffs into the lungs every 6 (six) hours as needed.  10/11/15   [provider]  vitamin B-12 (CYANOCOBALAMIN) 1000 MCG tablet Take 1,000 mcg by mouth daily.    [provider]    Physical Exam: Vitals:   05/30/19 2300 05/30/19 2315 05/30/19 2330 05/30/19 2345  BP: (!) 134/94 (!) 159/65 (!) 169/64 (!) 141/45  Pulse: 81 81 80 87  Resp: 15 18 14 20   Temp:      TempSrc:      SpO2: 99% 100% 98% 98%      Constitutional: Chronically ill looking in respiratory distress Vitals:   05/30/19 2300 05/30/19 2315 05/30/19 2330 05/30/19 2345  BP: (!) 134/94 (!) 159/65 (!) 169/64 (!) 141/45  Pulse: 81 81 80 87  Resp: 15 18 14 20   Temp:      TempSrc:      SpO2: 99% 100% 98% 98%   Eyes: PERRL, lids and conjunctivae normal ENMT: Mucous membranes are dry posterior pharynx clear of any exudate or lesions.Normal dentition.  Neck: normal, supple, no masses, no thyromegaly Respiratory: Patient is tachypneic with decreased air entry bilaterally, generalized expiratory wheezing rales and marked accessory muscle use.  Cardiovascular: Regular rate and rhythm, no murmurs / rubs / gallops. No extremity edema. 2+ pedal pulses. No carotid bruits.  Abdomen: no tenderness, no masses palpated.  No hepatosplenomegaly. Bowel sounds positive.  Musculoskeletal: no clubbing / cyanosis. No joint deformity upper and lower extremities. Good ROM, no contractures. Normal muscle tone.  Skin: no rashes, lesions, ulcers. No induration Neurologic: CN 2-12 grossly intact. Sensation intact, DTR normal. Strength 5/5 in all 4.  Psychiatric: Normal judgment and insight. Alert and oriented x 3.  Very anxious    Labs on Admission: I have personally reviewed following labs and imaging studies  CBC: Recent Labs  Lab 05/27/19 1858 05/30/19 2100  WBC 4.5 8.0  NEUTROABS  --  5.3  HGB 10.9* 11.1*  HCT 34.8* 35.0*  MCV 97.2 97.2  PLT 175 [id number]   Basic Metabolic Panel: Recent Labs  Lab 05/27/19 1858 05/30/19 2100  NA 139 140  K 3.8 3.9  CL 102 103  CO2 28 25  GLUCOSE 119* 185*  BUN 17 13  CREATININE 0.96 1.04*  CALCIUM 8.8* 8.9   GFR: Estimated Creatinine Clearance: 41.6 mL/min (A) (by C-G formula based on SCr of 1.04 mg/dL (H)). Liver Function Tests: Recent Labs  Lab 05/27/19 1858 05/30/19 2100  AST 20 13*  ALT 22 16  ALKPHOS 42 37*  BILITOT 0.4 0.7  PROT 6.5 6.1*  ALBUMIN 4.2 3.7   No results for input(s): LIPASE, AMYLASE in the last 168 hours. No results for input(s): AMMONIA in the last 168 hours. Coagulation Profile: No results for input(s): INR, PROTIME in the last 168 hours. Cardiac Enzymes: No results for input(s): CKTOTAL, CKMB, CKMBINDEX, TROPONINI in the last 168 hours. BNP (last 3 results) No results for input(s): PROBNP in the last 8760 hours. HbA1C: No results for input(s): HGBA1C in the last 72 hours. CBG: Recent Labs  Lab 05/27/19 2041  GLUCAP 163*   Lipid Profile: Recent Labs    05/30/19 2052  TRIG 162*   Thyroid Function Tests: No results for input(s): TSH, T4TOTAL, FREET4, T3FREE, THYROIDAB in the last 72 hours. Anemia Panel: Recent Labs    05/30/19 2100  FERRITIN 48   Urine analysis:    Component Value Date/Time   COLORURINE AMBER (A)  09/02/2017 0939   APPEARANCEUR CLOUDY (A) 09/02/2017 0939   APPEARANCEUR Clear 03/18/2013 1319   LABSPEC 1.014 09/02/2017 0939   LABSPEC 1.005 03/18/2013 1319   PHURINE 6.0 09/02/2017 0939   GLUCOSEU NEGATIVE 09/02/2017 0939   GLUCOSEU Negative 03/18/2013 1319   HGBUR SMALL (A) 09/02/2017 0939   BILIRUBINUR NEGATIVE 09/02/2017 0939   BILIRUBINUR Negative 03/18/2013 1319   KETONESUR NEGATIVE 09/02/2017 0939   PROTEINUR 100 (A) 09/02/2017 0939   UROBILINOGEN 1.0 06/24/2008 1545   NITRITE POSITIVE (A) 09/02/2017 0939   LEUKOCYTESUR MODERATE (A) 09/02/2017 0939   LEUKOCYTESUR 1+ 03/18/2013 1319   Sepsis Labs: @LABRCNTIP (procalcitonin:4,lacticidven:4) ) Recent Results (from the past 240 hour(s))  Respiratory Panel by RT PCR (Flu A&B, Covid) - Nasopharyngeal Swab     Status: Abnormal   Collection Time: 05/27/19  7:22 PM   Specimen: Nasopharyngeal Swab  Result Value Ref Range Status   SARS Coronavirus 2 by RT PCR POSITIVE (A) NEGATIVE Final    Comment: RESULT CALLED TO, READ BACK BY AND VERIFIED WITH: RIANA CHAPMAN 05/27/19 AT 2034 HS    Influenza A by PCR NEGATIVE NEGATIVE Final   Influenza B by PCR NEGATIVE NEGATIVE Final    Comment: (NOTE) The Xpert Xpress SARS-CoV-2/FLU/RSV assay is intended as an aid in  the diagnosis of influenza from Nasopharyngeal swab specimens and  should not be used as a sole basis for treatment. Nasal washings and  aspirates are unacceptable for Xpert Xpress SARS-CoV-2/FLU/RSV  testing. Fact Sheet for Patients: PinkCheek.be Fact Sheet for Healthcare Providers: GravelBags.it This test is not yet approved or cleared by the [place] FDA and  has been authorized for detection and/or diagnosis of SARS-CoV-2 by  FDA under an Emergency Use Authorization (EUA). This EUA will remain  in effect (meaning this test can be used) for the duration of the  Covid-19 declaration under Section 564(b)(1) of  the Act, 21  U.S.C. section 360bbb-3(b)(1), unless the authorization is  terminated or revoked. Performed at [facility], [address], [place], [place] [address]      Radiological Exams on Admission: DG Chest [place] 1 View  Result Date: 05/30/2019 CLINICAL DATA:  74 year old female with respiratory distress. Positive COVID-19. EXAM: PORTABLE CHEST 1 VIEW COMPARISON:  Chest radiograph dated 05/27/2019. FINDINGS: Background of emphysema. No focal consolidation, pleural effusion, pneumothorax. The cardiac silhouette is within normal limits. Atherosclerotic calcification of the aorta. No acute osseous pathology. Cervical ACDF. IMPRESSION: 1. No acute cardiopulmonary process. 2. Emphysema. Electronically Signed   By: Anner Crete M.D.   On: 05/30/2019 21:04    EKG: Independently reviewed.  Shows sinus rhythm with a rate of 96.  Incomplete left bundle branch block evidence of LVH by voltage criteria no significant ST changes  Assessment/Plan Principal Problem:   Acute respiratory failure due to COVID-19 [facility]) Active Problems:   Weakness generalized   Diabetes (HCC)   Essential hypertension   COPD (chronic obstructive pulmonary disease) (HCC)     #1 acute respiratory failure with hypoxia: Most likely COPD exacerbation triggered by COVID-19 infection.  Patient will be admitted and initiated on steroid antibiotics duo nebs as well as oxygen.  Will titrate oxygen down to her home regimen.  #2 COPD exacerbation: Again will initiate IV Solu-Medrol with antibiotics breathing treatment and oxygen titration.  Transition to oral steroids.  #3 COVID-19 infection: No obvious infiltrate yet.  Patient diagnosed only 3 days ago.  I will initiate remdesivir with the steroids.  Vitamins.  Supportive care.  #4 essential hypertension: Continue blood pressure control.  #5 diabetes: Initiate sliding scale insulin.  Patient is not insulin-dependent.  Hold Metformin.   DVT prophylaxis:  Lovenox Code Status: Full code Family Communication: 2 daughters over the phone Disposition Plan: To be determined Consults called: None Admission status: Inpatient  Severity of Illness: The appropriate patient status for this patient is INPATIENT. Inpatient status is judged to be reasonable and necessary in order to provide the required intensity of service to ensure the patient's safety. The patient's presenting symptoms, physical exam findings, and initial radiographic and laboratory data in the context of their chronic comorbidities is felt to place them at high risk for further clinical deterioration. Furthermore, it is not anticipated that the patient will be medically stable for discharge from the hospital within 2 midnights of admission. The following factors support the patient status of inpatient.   " The patient's presenting symptoms include shortness of breath and tachypnea. " The worrisome physical exam findings include tachypnea. " The initial radiographic and laboratory data are worrisome because of negative chest x-ray with infiltrates. " The chronic co-morbidities include 205 0298.   * I certify that at the point of admission it is my clinical judgment that the patient will require inpatient hospital care spanning beyond 2 midnights from the point of admission due to high intensity of service, high risk for further deterioration and high frequency of surveillance required.Barbette Merino MD [facility] Pager [phone]  If 7PM-7AM, please contact night-coverage [email or web address] Password [facility]  05/31/2019, 12:27 AM

## 2019-05-30 NOTE — ED Notes (Signed)
ED Provider at bedside. 

## 2019-05-30 NOTE — ED Notes (Addendum)
Late entry due to continuous patient care. Pt A&Ox4. Tachypneic, labored breathing, unable to speak in full sentences. Pt placed on Humidified Highflow [place] by RT.  Maintaining O2 sats at 100%. Seen by Dr. Gilford Raid at bedside All labs and both sets of cultures drawn, labeled with 2 pt identifiers, and sent to lab

## 2019-05-30 NOTE — ED Triage Notes (Signed)
Brought in by [facility] for Resp distress. Covid positive on Friday. Symptoms started last Wednesday. Hx COPD on 4L [place] home O2. Pt tachypneic in 50s and satting 98% on 4L [place] upon EMS arrival to patients home. Placed on CPAP. Pt has been intubated in the past Bilat. 18Gs to ACs 2g Magnesium  125 Solumedrol 2 doses 0.6 of epi IM 2 Duo Nebs

## 2019-05-31 ENCOUNTER — Other Ambulatory Visit: Payer: Self-pay

## 2019-05-31 DIAGNOSIS — J449 Chronic obstructive pulmonary disease, unspecified: Secondary | ICD-10-CM

## 2019-05-31 DIAGNOSIS — J9601 Acute respiratory failure with hypoxia: Secondary | ICD-10-CM

## 2019-05-31 LAB — D-DIMER, QUANTITATIVE: D-Dimer, Quant: 0.97 ug/mL-FEU — ABNORMAL HIGH (ref 0.00–0.50)

## 2019-05-31 LAB — CBC WITH DIFFERENTIAL/PLATELET
Abs Immature Granulocytes: 0.05 10*3/uL (ref 0.00–0.07)
Basophils Absolute: 0 10*3/uL (ref 0.0–0.1)
Basophils Relative: 0 %
Eosinophils Absolute: 0 10*3/uL (ref 0.0–0.5)
Eosinophils Relative: 0 %
HCT: 35.8 % — ABNORMAL LOW (ref 36.0–46.0)
Hemoglobin: 11.6 g/dL — ABNORMAL LOW (ref 12.0–15.0)
Immature Granulocytes: 1 %
Lymphocytes Relative: 7 %
Lymphs Abs: 0.5 10*3/uL — ABNORMAL LOW (ref 0.7–4.0)
MCH: 31.1 pg (ref 26.0–34.0)
MCHC: 32.4 g/dL (ref 30.0–36.0)
MCV: 96 fL (ref 80.0–100.0)
Monocytes Absolute: 0.2 10*3/uL (ref 0.1–1.0)
Monocytes Relative: 3 %
Neutro Abs: 6 10*3/uL (ref 1.7–7.7)
Neutrophils Relative %: 89 %
Platelets: 163 10*3/uL (ref 150–400)
RBC: 3.73 MIL/uL — ABNORMAL LOW (ref 3.87–5.11)
RDW: 12.8 % (ref 11.5–15.5)
WBC: 6.7 10*3/uL (ref 4.0–10.5)
nRBC: 0 % (ref 0.0–0.2)

## 2019-05-31 LAB — COMPREHENSIVE METABOLIC PANEL
ALT: 19 U/L (ref 0–44)
AST: 17 U/L (ref 15–41)
Albumin: 4.2 g/dL (ref 3.5–5.0)
Alkaline Phosphatase: 44 U/L (ref 38–126)
Anion gap: 11 (ref 5–15)
BUN: 15 mg/dL (ref 8–23)
CO2: 24 mmol/L (ref 22–32)
Calcium: 8.8 mg/dL — ABNORMAL LOW (ref 8.9–10.3)
Chloride: 103 mmol/L (ref 98–111)
Creatinine, Ser: 0.98 mg/dL (ref 0.44–1.00)
GFR calc Af Amer: >60 mL/min (ref >60)
GFR calc non Af Amer: 57 mL/min — ABNORMAL LOW (ref >60)
Glucose, Bld: 263 mg/dL — ABNORMAL HIGH (ref 70–99)
Potassium: 4.1 mmol/L (ref 3.5–5.1)
Sodium: 138 mmol/L (ref 135–145)
Total Bilirubin: 0.7 mg/dL (ref 0.3–1.2)
Total Protein: 6.6 g/dL (ref 6.5–8.1)

## 2019-05-31 LAB — MRSA PCR SCREENING: MRSA by PCR: NEGATIVE

## 2019-05-31 LAB — ABO/RH: ABO/RH(D): A POS

## 2019-05-31 LAB — GLUCOSE, CAPILLARY
Glucose-Capillary: 163 mg/dL — ABNORMAL HIGH (ref 70–99)
Glucose-Capillary: 172 mg/dL — ABNORMAL HIGH (ref 70–99)
Glucose-Capillary: 179 mg/dL — ABNORMAL HIGH (ref 70–99)
Glucose-Capillary: 191 mg/dL — ABNORMAL HIGH (ref 70–99)
Glucose-Capillary: 215 mg/dL — ABNORMAL HIGH (ref 70–99)
Glucose-Capillary: 261 mg/dL — ABNORMAL HIGH (ref 70–99)

## 2019-05-31 LAB — LACTIC ACID, PLASMA: Lactic Acid, Venous: 1.5 mmol/L (ref 0.5–1.9)

## 2019-05-31 LAB — FERRITIN: Ferritin: 54 ng/mL (ref 11–307)

## 2019-05-31 LAB — C-REACTIVE PROTEIN: CRP: 2.6 mg/dL — ABNORMAL HIGH (ref <1.0)

## 2019-05-31 MED ORDER — AMLODIPINE BESYLATE 10 MG PO TABS
10.0000 mg | ORAL_TABLET | Freq: Every day | ORAL | Status: DC
  Administered 2019-05-31 – 2019-06-03 (×4): 10 mg via ORAL
  Filled 2019-05-31 (×4): qty 1

## 2019-05-31 MED ORDER — TRAZODONE HCL 50 MG PO TABS
50.0000 mg | ORAL_TABLET | Freq: Every evening | ORAL | Status: DC | PRN
  Administered 2019-05-31: 50 mg via ORAL
  Filled 2019-05-31: qty 1

## 2019-05-31 MED ORDER — ONDANSETRON HCL 4 MG/2ML IJ SOLN
4.0000 mg | Freq: Four times a day (QID) | INTRAMUSCULAR | Status: DC | PRN
  Administered 2019-06-01: 4 mg via INTRAVENOUS
  Filled 2019-05-31: qty 2

## 2019-05-31 MED ORDER — FENOFIBRATE 160 MG PO TABS
160.0000 mg | ORAL_TABLET | Freq: Every day | ORAL | Status: DC
  Administered 2019-05-31 – 2019-06-03 (×4): 160 mg via ORAL
  Filled 2019-05-31 (×4): qty 1

## 2019-05-31 MED ORDER — HYDROCOD POLST-CPM POLST ER 10-8 MG/5ML PO SUER
5.0000 mL | Freq: Two times a day (BID) | ORAL | Status: DC | PRN
  Filled 2019-05-31: qty 5

## 2019-05-31 MED ORDER — GUAIFENESIN-DM 100-10 MG/5ML PO SYRP
10.0000 mL | ORAL_SOLUTION | ORAL | Status: DC | PRN
  Administered 2019-06-02: 10 mL via ORAL
  Filled 2019-05-31: qty 10

## 2019-05-31 MED ORDER — IPRATROPIUM-ALBUTEROL 20-100 MCG/ACT IN AERS
1.0000 | INHALATION_SPRAY | Freq: Four times a day (QID) | RESPIRATORY_TRACT | Status: DC
  Administered 2019-05-31 (×4): 1 via RESPIRATORY_TRACT
  Filled 2019-05-31: qty 4

## 2019-05-31 MED ORDER — ZINC SULFATE 220 (50 ZN) MG PO CAPS
220.0000 mg | ORAL_CAPSULE | Freq: Every day | ORAL | Status: DC
  Administered 2019-05-31 – 2019-06-03 (×4): 220 mg via ORAL
  Filled 2019-05-31 (×4): qty 1

## 2019-05-31 MED ORDER — ORAL CARE MOUTH RINSE: 15.0000 mL | Freq: Two times a day (BID) | OROMUCOSAL | Status: DC

## 2019-05-31 MED ORDER — VANCOMYCIN HCL 750 MG/150ML IV SOLN
750.0000 mg | INTRAVENOUS | Status: DC
  Administered 2019-05-31: 750 mg via INTRAVENOUS
  Filled 2019-05-31 (×2): qty 150

## 2019-05-31 MED ORDER — ORAL CARE MOUTH RINSE
15.0000 mL | Freq: Two times a day (BID) | OROMUCOSAL | Status: DC
  Administered 2019-05-31 – 2019-06-03 (×7): 15 mL via OROMUCOSAL

## 2019-05-31 MED ORDER — LOSARTAN POTASSIUM 50 MG PO TABS
100.0000 mg | ORAL_TABLET | Freq: Every day | ORAL | Status: DC
  Administered 2019-05-31 – 2019-06-03 (×4): 100 mg via ORAL
  Filled 2019-05-31 (×5): qty 2

## 2019-05-31 MED ORDER — SODIUM CHLORIDE 0.9 % IV SOLN: 200.0000 mg | Freq: Once | INTRAVENOUS | Status: DC

## 2019-05-31 MED ORDER — HYDRALAZINE HCL 20 MG/ML IJ SOLN
5.0000 mg | INTRAMUSCULAR | Status: DC | PRN
  Filled 2019-05-31: qty 1

## 2019-05-31 MED ORDER — ATORVASTATIN CALCIUM 40 MG PO TABS
40.0000 mg | ORAL_TABLET | Freq: Every day | ORAL | Status: DC
  Administered 2019-05-31 – 2019-06-03 (×4): 40 mg via ORAL
  Filled 2019-05-31 (×4): qty 1

## 2019-05-31 MED ORDER — UMECLIDINIUM BROMIDE 62.5 MCG/INH IN AEPB: 1.0000 | INHALATION_SPRAY | Freq: Every day | RESPIRATORY_TRACT | Status: DC

## 2019-05-31 MED ORDER — TIOTROPIUM BROMIDE MONOHYDRATE 18 MCG IN CAPS: 18.0000 ug | ORAL_CAPSULE | Freq: Every day | RESPIRATORY_TRACT | Status: DC

## 2019-05-31 MED ORDER — SODIUM CHLORIDE 0.9 % IV SOLN: 100.0000 mg | Freq: Every day | INTRAVENOUS | Status: DC

## 2019-05-31 MED ORDER — FLUTICASONE PROPIONATE 50 MCG/ACT NA SUSP
1.0000 | Freq: Every morning | NASAL | Status: DC
  Administered 2019-05-31 – 2019-06-03 (×4): 2 via NASAL
  Filled 2019-05-31: qty 16

## 2019-05-31 MED ORDER — SODIUM CHLORIDE 0.9 % IV SOLN
500.0000 mg | INTRAVENOUS | Status: DC
  Administered 2019-05-31 – 2019-06-03 (×4): 500 mg via INTRAVENOUS
  Filled 2019-05-31 (×5): qty 500

## 2019-05-31 MED ORDER — SODIUM CHLORIDE 0.9 % IV SOLN
INTRAVENOUS | Status: DC | PRN
  Administered 2019-05-31: 10 mL via INTRAVENOUS
  Administered 2019-06-02: 10 mL/h via INTRAVENOUS

## 2019-05-31 MED ORDER — METHYLPREDNISOLONE SODIUM SUCC 40 MG IJ SOLR
40.0000 mg | Freq: Four times a day (QID) | INTRAMUSCULAR | Status: DC
  Administered 2019-05-31 – 2019-06-02 (×9): 40 mg via INTRAVENOUS
  Filled 2019-05-31 (×9): qty 1

## 2019-05-31 MED ORDER — HYDRALAZINE HCL 20 MG/ML IJ SOLN
5.0000 mg | Freq: Four times a day (QID) | INTRAMUSCULAR | Status: DC | PRN
  Administered 2019-05-31 (×2): 5 mg via INTRAVENOUS
  Filled 2019-05-31: qty 1

## 2019-05-31 MED ORDER — MOMETASONE FURO-FORMOTEROL FUM 200-5 MCG/ACT IN AERO
2.0000 | INHALATION_SPRAY | Freq: Two times a day (BID) | RESPIRATORY_TRACT | Status: DC
  Administered 2019-05-31 – 2019-06-03 (×7): 2 via RESPIRATORY_TRACT
  Filled 2019-05-31: qty 8.8

## 2019-05-31 MED ORDER — LINAGLIPTIN 5 MG PO TABS
5.0000 mg | ORAL_TABLET | Freq: Every day | ORAL | Status: DC
  Administered 2019-05-31 – 2019-06-03 (×4): 5 mg via ORAL
  Filled 2019-05-31 (×5): qty 1

## 2019-05-31 MED ORDER — ASPIRIN EC 81 MG PO TBEC
81.0000 mg | DELAYED_RELEASE_TABLET | Freq: Every day | ORAL | Status: DC
  Administered 2019-05-31 – 2019-06-03 (×4): 81 mg via ORAL
  Filled 2019-05-31 (×5): qty 1

## 2019-05-31 MED ORDER — ONDANSETRON HCL 4 MG PO TABS
4.0000 mg | ORAL_TABLET | Freq: Four times a day (QID) | ORAL | Status: DC | PRN
  Administered 2019-06-03: 4 mg via ORAL
  Filled 2019-05-31: qty 1

## 2019-05-31 MED ORDER — HYDRALAZINE HCL 25 MG PO TABS
25.0000 mg | ORAL_TABLET | Freq: Four times a day (QID) | ORAL | Status: DC
  Administered 2019-05-31 – 2019-06-03 (×12): 25 mg via ORAL
  Filled 2019-05-31 (×12): qty 1

## 2019-05-31 MED ORDER — ENOXAPARIN SODIUM 40 MG/0.4ML ~~LOC~~ SOLN
40.0000 mg | SUBCUTANEOUS | Status: DC
  Administered 2019-05-31 – 2019-06-03 (×4): 40 mg via SUBCUTANEOUS
  Filled 2019-05-31 (×4): qty 0.4

## 2019-05-31 MED ORDER — ASCORBIC ACID 500 MG PO TABS
500.0000 mg | ORAL_TABLET | Freq: Every day | ORAL | Status: DC
  Administered 2019-05-31 – 2019-06-03 (×4): 500 mg via ORAL
  Filled 2019-05-31 (×4): qty 1

## 2019-05-31 MED ORDER — CLONAZEPAM 1 MG PO TABS: 1.0000 mg | ORAL_TABLET | Freq: Every morning | ORAL | Status: DC

## 2019-05-31 MED ORDER — HYDRALAZINE HCL 10 MG PO TABS
10.0000 mg | ORAL_TABLET | Freq: Once | ORAL | Status: AC
  Administered 2019-05-31: 10 mg via ORAL
  Filled 2019-05-31: qty 1

## 2019-05-31 MED ORDER — ACETAMINOPHEN 325 MG PO TABS
650.0000 mg | ORAL_TABLET | Freq: Four times a day (QID) | ORAL | Status: DC | PRN
  Administered 2019-05-31 – 2019-06-02 (×5): 650 mg via ORAL
  Filled 2019-05-31 (×5): qty 2

## 2019-05-31 MED ORDER — IPRATROPIUM-ALBUTEROL 20-100 MCG/ACT IN AERS
1.0000 | INHALATION_SPRAY | Freq: Three times a day (TID) | RESPIRATORY_TRACT | Status: DC
  Administered 2019-06-01 – 2019-06-03 (×8): 1 via RESPIRATORY_TRACT
  Filled 2019-05-31: qty 4

## 2019-05-31 MED ORDER — INSULIN ASPART 100 UNIT/ML ~~LOC~~ SOLN
0.0000 [IU] | Freq: Every day | SUBCUTANEOUS | Status: DC
  Administered 2019-05-31: 2 [IU] via SUBCUTANEOUS
  Administered 2019-05-31: 3 [IU] via SUBCUTANEOUS

## 2019-05-31 MED ORDER — INSULIN ASPART 100 UNIT/ML ~~LOC~~ SOLN
0.0000 [IU] | Freq: Three times a day (TID) | SUBCUTANEOUS | Status: DC
  Administered 2019-05-31 – 2019-06-01 (×4): 4 [IU] via SUBCUTANEOUS
  Administered 2019-06-01: 7 [IU] via SUBCUTANEOUS
  Administered 2019-06-01: 4 [IU] via SUBCUTANEOUS
  Administered 2019-06-02 (×3): 7 [IU] via SUBCUTANEOUS
  Administered 2019-06-03: 4 [IU] via SUBCUTANEOUS
  Administered 2019-06-03: 7 [IU] via SUBCUTANEOUS

## 2019-05-31 MED ORDER — METHYLPREDNISOLONE SODIUM SUCC 40 MG IJ SOLR
0.5000 mg/kg | Freq: Two times a day (BID) | INTRAMUSCULAR | Status: DC
  Administered 2019-05-31: 28 mg via INTRAVENOUS
  Filled 2019-05-31: qty 1

## 2019-05-31 NOTE — Plan of Care (Signed)
  Problem: Education: Goal: Knowledge of General Education information will improve Description: Including pain rating scale, medication(s)/side effects and non-pharmacologic comfort measures Outcome: Progressing   Problem: Health Behavior/Discharge Planning: Goal: Ability to manage health-related needs will improve Outcome: Progressing   Problem: Clinical Measurements: Goal: Ability to maintain clinical measurements within normal limits will improve Outcome: Progressing Goal: Will remain free from infection Outcome: Progressing Goal: Diagnostic test results will improve Outcome: Progressing Goal: Respiratory complications will improve Outcome: Progressing Goal: Cardiovascular complication will be avoided Outcome: Progressing   Problem: Activity: Goal: Risk for activity intolerance will decrease Outcome: Progressing   Problem: Nutrition: Goal: Adequate nutrition will be maintained Outcome: Progressing   Problem: Coping: Goal: Level of anxiety will decrease Outcome: Progressing   Problem: Elimination: Goal: Will not experience complications related to bowel motility Outcome: Progressing Goal: Will not experience complications related to urinary retention Outcome: Progressing   Problem: Pain Managment: Goal: General experience of comfort will improve Outcome: Progressing   Problem: Safety: Goal: Ability to remain free from injury will improve Outcome: Progressing   Problem: Skin Integrity: Goal: Risk for impaired skin integrity will decrease Outcome: Progressing   Problem: Education: Goal: Knowledge of risk factors and measures for prevention of condition will improve Outcome: Progressing   Problem: Coping: Goal: Psychosocial and spiritual needs will be supported Outcome: Progressing   Problem: Respiratory: Goal: Will maintain a patent airway Outcome: Progressing Goal: Complications related to the disease process, condition or treatment will be avoided or  minimized Outcome: Progressing   Problem: Education: Goal: Knowledge of disease or condition will improve Outcome: Progressing Goal: Knowledge of the prescribed therapeutic regimen will improve Outcome: Progressing Goal: Individualized Educational Video(s) Outcome: Progressing   Problem: Activity: Goal: Ability to tolerate increased activity will improve Outcome: Progressing Goal: Will verbalize the importance of balancing activity with adequate rest periods Outcome: Progressing   Problem: Respiratory: Goal: Ability to maintain a clear airway will improve Outcome: Progressing Goal: Levels of oxygenation will improve Outcome: Progressing Goal: Ability to maintain adequate ventilation will improve Outcome: Progressing

## 2019-05-31 NOTE — Progress Notes (Signed)
Kristin [name] was admitted to 5w24 via stretcher from the ED.  Heated high flow oxygen was set up at the beside by respiratory.  The patient is alert and oriented x4.  Admission booklet given.  Bed is in the lowest position, bed alarm activated.  Call bell and telephone are within reach.  Explained to patient how to use call bell and telephone, patient indicated understanding.  Patient denies any further needs at this time.    This nurse spoke with the patient's husband, [name], after the patient was transferred to the unit.  The patient spoke to her husband briefly over the phone as well.  All questions were answered to his satisfaction.

## 2019-05-31 NOTE — Progress Notes (Signed)
PROGRESS NOTE                                                                                                                                                                                                             Patient Demographics:    Kristin [name], is a 74 y.o. female, DOB - Aug 28, 1945, [id number]  Admit date - 05/30/2019   Admitting Physician Elwyn Reach, MD  Outpatient Primary MD for the patient is Glendon Axe, MD  LOS - 1   Chief Complaint  Patient presents with  . Respiratory Distress       Brief Narrative    74 y.o. female with medical history significant of COPD, diabetes, hyperlipidemia, hypertension, previous CVA who was diagnosed with COVID-19 on Friday 3 days ago after symptoms that started 5 days ago.  She is normally on 4 L of oxygen at home.    Patient did receive both doses of further vaccine(first dose 2/1, second dose 3/1), She presents to ED secondary to tachypnea, and hypoxia, while in ED she did require failure liters heated high flow nasal cannula, she looked comfortable, difficult for emphysema, but no evidence of multifocal opacities, patient was admitted for worsening hypoxia.    Subjective:    Kristin [name] today reports she is feeling better, dyspnea has improved, reports nonproductive cough, denies any fever or chills .   Assessment  & Plan :    Principal Problem:   Acute respiratory failure due to COVID-19 [facility]) Active Problems:   Weakness generalized   Diabetes ([place])   Essential hypertension   COPD (chronic obstructive pulmonary disease) (HCC)   Acute on chronic hypoxic respiratory failure due to COPD exacerbation -Nasal cannula at baseline, with significant increase of oxygen requirement on presentation this appears to be improving, this morning she was weaned to 15 L high flow, and currently she is back on baseline at 4 L nasal cannula. -I do not think COVID-19 infection is contributing  to any of her symptoms, especially she received her vaccine, and her inflammatory markers is not significantly elevated, appears most of her symptoms related to COPD exacerbation,  continue with remdesivir for next 24 hours, will discontinue if she remains stable(maturity markers trending up) -She was encouraged use incentive spirometry, flutter valve, out of bed to chair. -Continue to trend inflammatory markers. -Continue with IV  steroids. -Continue with IV azithromycin given her productive cough. -Continue with Dulera.  COVID-19 Labs  Recent Labs    05/30/19 2100 05/31/19 0036  DDIMER 0.79* 0.97*  FERRITIN 48 54  LDH 116  --   CRP 1.6* 2.6*    Lab Results  Component Value Date   SARSCOV2NAA POSITIVE (A) 05/27/2019   COVID-19 infection -Please see above discussion  Hypertension -Continue with home medications, on as needed hydralazine as well  Diabetes mellitus -Continue with insulin sliding scale, started on Tradjenta  Hyperlipidemia -Continue with home medications  Code Status : Full  Family Communication  : Left  her husband in voicemail  Disposition Plan  : home  Barriers For Discharge : Remains on IV steroids, still hypoxic  Consults  :  None  Procedures  : None  DVT Prophylaxis  :  [place] lovenox  Lab Results  Component Value Date   PLT 163 05/31/2019    Antibiotics  :    Anti-infectives (From admission, onward)   Start     Dose/Rate Route Frequency Ordered Stop   05/31/19 1000  remdesivir 100 mg in sodium chloride 0.9 % 100 mL IVPB  Status:  Discontinued     100 mg 200 mL/hr over 30 Minutes Intravenous Daily 05/31/19 0029 05/31/19 0038   05/31/19 1000  remdesivir 100 mg in sodium chloride 0.9 % 100 mL IVPB     100 mg 200 mL/hr over 30 Minutes Intravenous Daily 05/30/19 2224 06/04/19 0959   05/31/19 0030  remdesivir 200 mg in sodium chloride 0.9% 250 mL IVPB  Status:  Discontinued     200 mg 580 mL/hr over 30 Minutes Intravenous Once 05/31/19 0029  05/31/19 0038   05/31/19 0027  azithromycin (ZITHROMAX) 500 mg in sodium chloride 0.9 % 250 mL IVPB     500 mg 250 mL/hr over 60 Minutes Intravenous Every 24 hours 05/31/19 0029     05/30/19 2230  remdesivir 100 mg in sodium chloride 0.9 % 100 mL IVPB     100 mg 200 mL/hr over 30 Minutes Intravenous Every 30 min 05/30/19 2224 05/31/19 0007        Objective:   Vitals:   05/31/19 0800 05/31/19 0943 05/31/19 1022 05/31/19 1200  BP: (!) 174/61   (!) 159/64  Pulse:    87  Resp: 20   16  Temp: 97.7 F (36.5 C)   97.7 F (36.5 C)  TempSrc: Oral   Oral  SpO2: 97% 97% 97% 97%  Weight:      Height:        Wt Readings from Last 3 Encounters:  05/31/19 57.4 kg  05/27/19 56.2 kg  12/01/17 55.8 kg     Intake/Output Summary (Last 24 hours) at 05/31/2019 1542 Last data filed at 05/31/2019 1300 Gross per 24 hour  Intake 2217.89 ml  Output 1850 ml  Net 367.89 ml     Physical Exam  Awake Alert, Oriented X 3, No new F.N deficits, Normal affect Symmetrical Chest wall movement, Good air movement bilaterally, has wheezing bilaterally RRR,No Gallops,Rubs or new Murmurs, No Parasternal Heave +ve B.Sounds, Abd Soft, No tenderness,  No rebound - guarding or rigidity. No Cyanosis, Clubbing or edema, No new Rash or bruise     Data Review:    CBC Recent Labs  Lab 05/27/19 1858 05/30/19 2100 05/31/19 0036  WBC 4.5 8.0 6.7  HGB 10.9* 11.1* 11.6*  HCT 34.8* 35.0* 35.8*  PLT 175 180 163  MCV 97.2 97.2 96.0  MCH 30.4 30.8 31.1  MCHC 31.3 31.7 32.4  RDW 12.9 12.6 12.8  LYMPHSABS  --  1.9 0.5*  MONOABS  --  0.7 0.2  EOSABS  --  0.1 0.0  BASOSABS  --  0.0 0.0    Chemistries  Recent Labs  Lab 05/27/19 1858 05/30/19 2100 05/31/19 0036  NA 139 140 138  K 3.8 3.9 4.1  CL 102 103 103  CO2 28 25 24   GLUCOSE 119* 185* 263*  BUN 17 13 15   CREATININE 0.96 1.04* 0.98  CALCIUM 8.8* 8.9 8.8*  AST 20 13* 17  ALT 22 16 19   ALKPHOS 42 37* 44  BILITOT 0.4 0.7 0.7    ------------------------------------------------------------------------------------------------------------------ Recent Labs    05/30/19 2052  TRIG 162*    Lab Results  Component Value Date   HGBA1C 5.4 10/23/2015   ------------------------------------------------------------------------------------------------------------------ No results for input(s): TSH, T4TOTAL, T3FREE, THYROIDAB in the last 72 hours.  Invalid input(s): FREET3 ------------------------------------------------------------------------------------------------------------------ Recent Labs    05/30/19 2100 05/31/19 0036  FERRITIN 48 54    Coagulation profile No results for input(s): INR, PROTIME in the last 168 hours.  Recent Labs    05/30/19 2100 05/31/19 0036  DDIMER 0.79* 0.97*    Cardiac Enzymes No results for input(s): CKMB, TROPONINI, MYOGLOBIN in the last 168 hours.  Invalid input(s): CK ------------------------------------------------------------------------------------------------------------------    Component Value Date/Time   BNP 74.0 05/27/2019 1858    Inpatient Medications  Scheduled Meds: . vitamin C  500 mg Oral Daily  . aspirin EC  81 mg Oral Daily  . atorvastatin  40 mg Oral Daily  . enoxaparin (LOVENOX) injection  40 mg Subcutaneous Q24H  . fenofibrate  160 mg Oral Daily  . fluticasone  1-2 spray Each Nare q AM  . insulin aspart  0-20 Units Subcutaneous TID WC  . insulin aspart  0-5 Units Subcutaneous QHS  . Ipratropium-Albuterol  1 puff Inhalation Q6H  . linagliptin  5 mg Oral Daily  . losartan  100 mg Oral Daily  . mouth rinse  15 mL Mouth Rinse BID  . methylPREDNISolone (SOLU-MEDROL) injection  40 mg Intravenous Q6H  . mometasone-formoterol  2 puff Inhalation BID  . zinc sulfate  220 mg Oral Daily   Continuous Infusions: . azithromycin Stopped (05/31/19 1011)  . remdesivir 100 mg in NS 100 mL 100 mg (05/31/19 0910)   PRN Meds:.acetaminophen,  chlorpheniramine-HYDROcodone, guaiFENesin-dextromethorphan, hydrALAZINE, ondansetron **OR** ondansetron (ZOFRAN) IV  Micro Results Recent Results (from the past 240 hour(s))  Respiratory Panel by RT PCR (Flu A&B, Covid) - Nasopharyngeal Swab     Status: Abnormal   Collection Time: 05/27/19  7:22 PM   Specimen: Nasopharyngeal Swab  Result Value Ref Range Status   SARS Coronavirus 2 by RT PCR POSITIVE (A) NEGATIVE Final    Comment: RESULT CALLED TO, READ BACK BY AND VERIFIED WITH: RIANA CHAPMAN 05/27/19 AT 2034 HS    Influenza A by PCR NEGATIVE NEGATIVE Final   Influenza B by PCR NEGATIVE NEGATIVE Final    Comment: (NOTE) The Xpert Xpress SARS-CoV-2/FLU/RSV assay is intended as an aid in  the diagnosis of influenza from Nasopharyngeal swab specimens and  should not be used as a sole basis for treatment. Nasal washings and  aspirates are unacceptable for Xpert Xpress SARS-CoV-2/FLU/RSV  testing. Fact Sheet for Patients: PinkCheek.be Fact Sheet for Healthcare Providers: GravelBags.it This test is not yet approved or cleared by the [place] FDA and  has been authorized for detection and/or diagnosis of  SARS-CoV-2 by  FDA under an Emergency Use Authorization (EUA). This EUA will remain  in effect (meaning this test can be used) for the duration of the  Covid-19 declaration under Section 564(b)(1) of the Act, 21  U.S.C. section 360bbb-3(b)(1), unless the authorization is  terminated or revoked. Performed at [facility], [address] [place], [place] [address]   Blood Culture (routine x 2)     Status: None (Preliminary result)   Collection Time: 05/30/19  9:00 PM   Specimen: BLOOD LEFT ARM  Result Value Ref Range Status   Specimen Description BLOOD LEFT ARM  Final   Special Requests   Final    BOTTLES DRAWN AEROBIC AND ANAEROBIC Blood Culture adequate volume   Culture   Final    NO GROWTH < 12  HOURS Performed at [facility], [place] [address], [place], [place] [address]    Report Status PENDING  Incomplete  Blood Culture (routine x 2)     Status: None (Preliminary result)   Collection Time: 05/30/19  9:00 PM   Specimen: BLOOD RIGHT ARM  Result Value Ref Range Status   Specimen Description BLOOD RIGHT ARM  Final   Special Requests   Final    BOTTLES DRAWN AEROBIC AND ANAEROBIC Blood Culture adequate volume   Culture   Final    NO GROWTH < 12 HOURS Performed at [facility], [place] [address], [place],  [address]    Report Status PENDING  Incomplete  MRSA PCR Screening     Status: None   Collection Time: 05/31/19  1:28 AM   Specimen: Nasal Mucosa; Nasopharyngeal  Result Value Ref Range Status   MRSA by PCR NEGATIVE NEGATIVE Final    Comment:        The GeneXpert MRSA Assay (FDA approved for NASAL specimens only), is one component of a comprehensive MRSA colonization surveillance program. It is not intended to diagnose MRSA infection nor to guide or monitor treatment for MRSA infections. Performed at [facility], [place] [address] [place],  [address]     Radiology Reports DG Chest [place] 1 View  Result Date: 05/30/2019 CLINICAL DATA:  74 year old female with respiratory distress. Positive COVID-19. EXAM: PORTABLE CHEST 1 VIEW COMPARISON:  Chest radiograph dated 05/27/2019. FINDINGS: Background of emphysema. No focal consolidation, pleural effusion, pneumothorax. The cardiac silhouette is within normal limits. Atherosclerotic calcification of the aorta. No acute osseous pathology. Cervical ACDF. IMPRESSION: 1. No acute cardiopulmonary process. 2. Emphysema. Electronically Signed   By: Anner Crete M.D.   On: 05/30/2019 21:04   DG Chest Port 1 View  Result Date: 05/27/2019 CLINICAL DATA:  Difficulty breathing EXAM: PORTABLE CHEST 1 VIEW COMPARISON:  09/02/2017 FINDINGS: Cardiac shadows within normal limits. Aortic calcifications are noted.  Lungs are well aerated bilaterally. Mild scarring is noted in the bases bilaterally. No bony abnormality is seen. IMPRESSION: Chronic changes without acute abnormality. Electronically Signed   By: Inez Catalina M.D.   On: 05/27/2019 19:37    Phillips Climes M.D on 05/31/2019 at 3:42 PM  Between 7am to 7pm - Pager - [phone]  After 7pm go to [email or web address] - password [facility]  [facility] -  Office  [phone]

## 2019-05-31 NOTE — Progress Notes (Signed)
RN to transfer pt to [place] [facility] at 15L and will titrate as tolerated. RT will continue to monitor

## 2019-05-31 NOTE — ED Notes (Signed)
Pt incontinent of urine. Pt turned, cleaned, and repositioned. New linens provided. Skin kept clean and dry

## 2019-05-31 NOTE — ED Notes (Signed)
Remdesivir given per [facility]. Name/DOB verified with pt

## 2019-05-31 NOTE — Progress Notes (Signed)
PHARMACY - PHYSICIAN COMMUNICATION CRITICAL VALUE ALERT - BLOOD CULTURE IDENTIFICATION (BCID)  Kristin [name] is an 74 y.o. female who presented to [facility] on 05/30/2019 with a chief complaint of shortness of breath  Assessment:  1 out of 4 blood cultures positive for GPCs in clusters  Name of physician (or Provider) Contacted: Dr. Waldron Labs  Current antibiotics: Azithromycin  Changes to prescribed antibiotics:  Dr. Waldron Labs would like to start vancomycin until the cultures come back   Kristin [name] 05/31/2019  6:33 PM

## 2019-05-31 NOTE — Progress Notes (Signed)
   Vital Signs MEWS/VS Documentation      05/31/2019 0040 05/31/2019 0054 05/31/2019 0348 05/31/2019 0401   MEWS Score:  0  2  0  0   MEWS Score Color:  Green  Yellow  Green  Green   Resp:  --  (!) 28  17  17    Pulse:  --  98  77  74   BP:  --  (!) 189/70  (!) 135/58  (!) 134/59   Temp:  --  98.3 F (36.8 C)  98.2 F (36.8 C)  98.2 F (36.8 C)   O2 Device:  --  HFNC  --  HFNC   O2 Flow Rate (L/min):  --  20 L/min  --  20 L/min   Level of Consciousness:  --  Alert  --  Alert           Juanda Bond Krystianna Soth 05/31/2019,4:30 AM

## 2019-05-31 NOTE — ED Notes (Signed)
Admitting MD at bedside.

## 2019-05-31 NOTE — ED Notes (Signed)
Pt transported to 5W Rm 24 in NAD on continuous monitors with this RN. Equal rise and Pt speaking in full sentences. Equal rise and fall of chest noted. VSS. RR and SPO2 WNL.Pt alert, speaking in full sentences. Pt placed on 15L NRB for transport. RT called and aware of pt's transport.

## 2019-05-31 NOTE — Progress Notes (Signed)
   Vital Signs MEWS/VS Documentation      05/31/2019 1849 05/31/2019 1922 05/31/2019 1923 05/31/2019 1951   MEWS Score:  0  0  0  0   MEWS Score Color:  Green  Green  Green  Green   Resp:  --  14  --  18   Pulse:  --  90  --  87   BP:  (!) 197/69  (!) 173/67  --  (!) 164/117   Temp:  --  --  --  97.7 F (36.5 C)   O2 Device:  --  --  --  Nasal Cannula   O2 Flow Rate (L/min):  --  --  --  4 L/min   Level of Consciousness:  --  --  --  Anders Simmonds 05/31/2019,11:44 PM

## 2019-05-31 NOTE — Progress Notes (Signed)
Pharmacy Antibiotic Note  Kristin [name] is a 74 y.o. female admitted on 05/30/2019 with bacteremia.  Pharmacy has been consulted for Vancomycin dosing. Patient with 1 out of 4 blood cultures positive for GPCs in clusters.  Vancomycin 750 mg IV Q 24 hrs. Goal AUC 400-550. Expected AUC: 442 SCr used: 0.98  Plan: Vancomycin 750 mg IV q24hr Monitor renal function, C&S and vanc levels if needed  Height: 5\' 4"  (162.6 cm) Weight: 126 lb 8.7 oz (57.4 kg) IBW/kg (Calculated) : 54.7  Temp (24hrs), Avg:98.1 F (36.7 C), Min:97.7 F (36.5 C), Max:98.9 F (37.2 C)  Recent Labs  Lab 05/27/19 1858 05/30/19 2052 05/30/19 2100 05/31/19 0036  WBC 4.5  --  8.0 6.7  CREATININE 0.96  --  1.04* 0.98  LATICACIDVEN  --  1.1  --  1.5    Estimated Creatinine Clearance: 44.1 mL/min (by C-G formula based on SCr of 0.98 mg/dL).    Allergies  Allergen Reactions  . Codeine Nausea Only  . Morphine And Related Nausea Only  . Prednisone Other (See Comments)    Elevated BGL into the 300's    Antimicrobials this admission: Vanc 3/16 >>    Thank you for allowing pharmacy to be a part of this patient's care.  Alanda Slim, PharmD, [facility] Clinical Pharmacist Please see AMION for all Pharmacists' Contact Phone Numbers 05/31/2019, 6:46 PM

## 2019-06-01 DIAGNOSIS — I10 Essential (primary) hypertension: Secondary | ICD-10-CM

## 2019-06-01 DIAGNOSIS — E1122 Type 2 diabetes mellitus with diabetic chronic kidney disease: Secondary | ICD-10-CM

## 2019-06-01 DIAGNOSIS — N183 Chronic kidney disease, stage 3 unspecified: Secondary | ICD-10-CM

## 2019-06-01 DIAGNOSIS — J441 Chronic obstructive pulmonary disease with (acute) exacerbation: Secondary | ICD-10-CM

## 2019-06-01 LAB — GLUCOSE, CAPILLARY
Glucose-Capillary: 210 mg/dL — ABNORMAL HIGH (ref 70–99)
Glucose-Capillary: 160 mg/dL — ABNORMAL HIGH (ref 70–99)
Glucose-Capillary: 155 mg/dL — ABNORMAL HIGH (ref 70–99)
Glucose-Capillary: 189 mg/dL — ABNORMAL HIGH (ref 70–99)

## 2019-06-01 LAB — COMPREHENSIVE METABOLIC PANEL
ALT: 19 U/L (ref 0–44)
AST: 19 U/L (ref 15–41)
Albumin: 3.6 g/dL (ref 3.5–5.0)
Alkaline Phosphatase: 40 U/L (ref 38–126)
Anion gap: 14 (ref 5–15)
BUN: 17 mg/dL (ref 8–23)
CO2: 20 mmol/L — ABNORMAL LOW (ref 22–32)
Calcium: 9 mg/dL (ref 8.9–10.3)
Chloride: 107 mmol/L (ref 98–111)
Creatinine, Ser: 0.83 mg/dL (ref 0.44–1.00)
GFR calc Af Amer: >60 mL/min (ref >60)
GFR calc non Af Amer: >60 mL/min (ref >60)
Glucose, Bld: 244 mg/dL — ABNORMAL HIGH (ref 70–99)
Potassium: 3.9 mmol/L (ref 3.5–5.1)
Sodium: 141 mmol/L (ref 135–145)
Total Bilirubin: 0.9 mg/dL (ref 0.3–1.2)
Total Protein: 6 g/dL — ABNORMAL LOW (ref 6.5–8.1)

## 2019-06-01 LAB — CBC WITH DIFFERENTIAL/PLATELET
Abs Immature Granulocytes: 0.07 10*3/uL (ref 0.00–0.07)
Basophils Absolute: 0 10*3/uL (ref 0.0–0.1)
Basophils Relative: 0 %
Eosinophils Absolute: 0 10*3/uL (ref 0.0–0.5)
Eosinophils Relative: 0 %
HCT: 35.3 % — ABNORMAL LOW (ref 36.0–46.0)
Hemoglobin: 11.5 g/dL — ABNORMAL LOW (ref 12.0–15.0)
Immature Granulocytes: 1 %
Lymphocytes Relative: 12 %
Lymphs Abs: 0.9 10*3/uL (ref 0.7–4.0)
MCH: 30.9 pg (ref 26.0–34.0)
MCHC: 32.6 g/dL (ref 30.0–36.0)
MCV: 94.9 fL (ref 80.0–100.0)
Monocytes Absolute: 0.4 10*3/uL (ref 0.1–1.0)
Monocytes Relative: 5 %
Neutro Abs: 5.9 10*3/uL (ref 1.7–7.7)
Neutrophils Relative %: 82 %
Platelets: 199 10*3/uL (ref 150–400)
RBC: 3.72 MIL/uL — ABNORMAL LOW (ref 3.87–5.11)
RDW: 12.8 % (ref 11.5–15.5)
WBC: 7.2 10*3/uL (ref 4.0–10.5)
nRBC: 0 % (ref 0.0–0.2)

## 2019-06-01 LAB — D-DIMER, QUANTITATIVE: D-Dimer, Quant: 0.87 ug/mL-FEU — ABNORMAL HIGH (ref 0.00–0.50)

## 2019-06-01 LAB — C-REACTIVE PROTEIN: CRP: 1 mg/dL — ABNORMAL HIGH (ref <1.0)

## 2019-06-01 LAB — FERRITIN: Ferritin: 64 ng/mL (ref 11–307)

## 2019-06-01 MED ORDER — TRAZODONE HCL 50 MG PO TABS
150.0000 mg | ORAL_TABLET | Freq: Every evening | ORAL | Status: DC | PRN
  Administered 2019-06-01 – 2019-06-02 (×2): 150 mg via ORAL
  Filled 2019-06-01 (×2): qty 3

## 2019-06-01 MED ORDER — CLONAZEPAM 0.25 MG PO TBDP
0.2500 mg | ORAL_TABLET | Freq: Two times a day (BID) | ORAL | Status: DC | PRN
  Administered 2019-06-01 – 2019-06-02 (×4): 0.25 mg via ORAL
  Filled 2019-06-01 (×4): qty 1

## 2019-06-01 MED ORDER — PRAMIPEXOLE DIHYDROCHLORIDE 1 MG PO TABS
1.0000 mg | ORAL_TABLET | Freq: Every day | ORAL | Status: DC
  Administered 2019-06-02 (×2): 1 mg via ORAL
  Filled 2019-06-01 (×4): qty 1

## 2019-06-01 NOTE — Progress Notes (Signed)
Inpatient Diabetes Program Recommendations  AACE/ADA: New Consensus Statement on Inpatient Glycemic Control (2015)  Target Ranges:  Prepandial:   less than 140 mg/dL      Peak postprandial:   less than 180 mg/dL (1-2 hours)      Critically ill patients:  140 - 180 mg/dL   Lab Results  Component Value Date   GLUCAP 210 (H) 06/01/2019   HGBA1C 5.4 10/23/2015    Review of Glycemic Control Results for Kristin [name], Kristin [name] (MRN [id number]) as of 06/01/2019 12:27  Ref. Range 05/31/2019 07:50 05/31/2019 07:51 05/31/2019 11:53 05/31/2019 16:44 05/31/2019 20:13 06/01/2019 07:54 06/01/2019 12:25  Glucose-Capillary Latest Ref Range: 70 - 99 mg/dL 163 (H) 172 (H) 179 (H) 191 (H) 261 (H) 210 (H) 160 (H)   Diabetes history: DM 2 Outpatient Diabetes medications: Metformin 500 mg bid Current orders for Inpatient glycemic control:  Novolog 0-20 units tid + hs Tradjenta 5 mg Daily  Solumedrol 40 mg Q6 hours  Inpatient Diabetes Program Recommendations:    Glucose trends in lower 200 range. Poor PO intake at 25%.  Consider Levemir 5 units.  Thanks,  Tama Headings RN, MSN, BC-ADM Inpatient Diabetes Coordinator Team Pager [phone] (8a-5p)

## 2019-06-01 NOTE — Progress Notes (Addendum)
PROGRESS NOTE                                                                                                                                                                                                             Patient Demographics:    Kristin [name], is a 74 y.o. female, DOB - [birth year], [id number]  Outpatient Primary MD for the patient is Glendon Axe, MD   Admit date - 05/30/2019   LOS - 2  Chief Complaint  Patient presents with  . Respiratory Distress       Brief Narrative: Patient is a 74 y.o. female with PMHx of COPD with chronic hypoxic respiratory failure on 4 L of oxygen at home, HTN, DM, HLD, prior CVA who presented with shortness of breath and tachypnea-found to have acute on chronic hypoxic respiratory failure secondary to COPD exacerbation and admitted to the hospitalist service.  See below for further details.  Note-Per prior chart review-received second dose of Covid vaccine on 3/1.  Significant Events: 3/15>> admit to [facility]-for acute on chronic hypoxic respiratory failure requiring 15 L of high flow oxygen. 3/16>> weaned to 4 L.  Microbiology data: 3/17>> blood cultures: Pending 3/15>> blood cultures 1/2-coag negative staph (likely contaminant) 3/12>> COVID-19 positive  COVID-19 medications: None  DVT prophylaxis: Lovenox  Procedures: None  Consults: None   Subjective:    Kristin [name] today is very anxious-she has not stepped overnight-she is worried about being hospitalized and positive "blood culture results".  She is otherwise comfortable-not short of breath-easily speaking in full sentences.  She denies any chest pain, nausea or vomiting.   Assessment  & Plan :   Acute on chronic hypoxic respiratory failure secondary to COPD exacerbation: Improving-anxiety playing a big role in her symptoms.  Continue steroids and bronchodilators.  She has been weaned back to her usual  regimen of 4 L.  Starting low-dose Klonopin to manage anxiety.   COVID-19 infection: Appears asymptomatic-worsening hypoxia on admission was from COPD exacerbation.  Chest x-ray without pneumonia-inflammatory markers not significantly elevated.  Addendum-reviewed prior MD note-appears to have been started on remdesivir-which I think is reasonable to continue since it has been started on.  Fever: afebrile  O2 requirements:  SpO2: 98 % O2 Flow Rate (L/min): 4 L/min FiO2 (%): 40 %   COVID-19 Labs: Recent Labs    05/30/19  2100 05/31/19 0036 06/01/19 0301  DDIMER 0.79* 0.97* 0.87*  FERRITIN 48 54 64  LDH 116  --   --   CRP 1.6* 2.6* 1.0*       Component Value Date/Time   BNP 74.0 05/27/2019 1858    Recent Labs  Lab 05/30/19 2100  PROCALCITON <0.10    Lab Results  Component Value Date   SARSCOV2NAA POSITIVE (A) 05/27/2019     Prone/Incentive Spirometry: encouraged  incentive spirometry use 3-4/hour.  Coag negative staph bacteremia: Likely a contaminant-repeat blood cultures today.  Okay to stop IV vancomycin that was empirically started on 3/16.  HTN: Controlled-continue amlodipine, hydralazine, losartan  HLD: Continue Lipitor and fenofibrate  DM-2: CBGs stable-continue SSI and Tradjenta   Recent Labs    05/31/19 2013 06/01/19 0754 06/01/19 1225  GLUCAP 261* 210* 160*    ABG:    Component Value Date/Time   HCO3 25.8 09/02/2017 0939   TCO2 29 01/07/2010 2208   O2SAT 85.1 09/02/2017 0939    Vent Settings: N/A    Condition - Stable  Family Communication  :  Spouse updated over the phone on 3/17  Code Status :  Full Code  Diet :  Diet Order            Diet heart healthy/carb modified Room service appropriate? Yes; Fluid consistency: Thin  Diet effective now               Disposition Plan  :  Remain hospitalized-probably home  Barriers to discharge: Hypoxia requiring O2 supplementation/complete 5 days of IV Remdesivir  Antimicorbials  :      Anti-infectives (From admission, onward)   Start     Dose/Rate Route Frequency Ordered Stop   05/31/19 1900  vancomycin (VANCOREADY) IVPB 750 mg/150 mL  Status:  Discontinued     750 mg 150 mL/hr over 60 Minutes Intravenous Every 24 hours 05/31/19 1847 06/01/19 1341   05/31/19 1000  remdesivir 100 mg in sodium chloride 0.9 % 100 mL IVPB  Status:  Discontinued     100 mg 200 mL/hr over 30 Minutes Intravenous Daily 05/31/19 0029 05/31/19 0038   05/31/19 1000  remdesivir 100 mg in sodium chloride 0.9 % 100 mL IVPB     100 mg 200 mL/hr over 30 Minutes Intravenous Daily 05/30/19 2224 06/04/19 0959   05/31/19 0030  remdesivir 200 mg in sodium chloride 0.9% 250 mL IVPB  Status:  Discontinued     200 mg 580 mL/hr over 30 Minutes Intravenous Once 05/31/19 0029 05/31/19 0038   05/31/19 0027  azithromycin (ZITHROMAX) 500 mg in sodium chloride 0.9 % 250 mL IVPB     500 mg 250 mL/hr over 60 Minutes Intravenous Every 24 hours 05/31/19 0029     05/30/19 2230  remdesivir 100 mg in sodium chloride 0.9 % 100 mL IVPB     100 mg 200 mL/hr over 30 Minutes Intravenous Every 30 min 05/30/19 2224 05/31/19 0007      Inpatient Medications  Scheduled Meds: . amLODipine  10 mg Oral Daily  . vitamin C  500 mg Oral Daily  . aspirin EC  81 mg Oral Daily  . atorvastatin  40 mg Oral Daily  . enoxaparin (LOVENOX) injection  40 mg Subcutaneous Q24H  . fenofibrate  160 mg Oral Daily  . fluticasone  1-2 spray Each Nare q AM  . hydrALAZINE  25 mg Oral Q6H  . insulin aspart  0-20 Units Subcutaneous TID WC  . insulin aspart  0-5 Units Subcutaneous QHS  . Ipratropium-Albuterol  1 puff Inhalation TID  . linagliptin  5 mg Oral Daily  . losartan  100 mg Oral Daily  . mouth rinse  15 mL Mouth Rinse BID  . methylPREDNISolone (SOLU-MEDROL) injection  40 mg Intravenous Q6H  . mometasone-formoterol  2 puff Inhalation BID  . zinc sulfate  220 mg Oral Daily   Continuous Infusions: . sodium chloride 10 mL/hr at  06/01/19 0022  . azithromycin Stopped (06/01/19 0122)  . remdesivir 100 mg in NS 100 mL 100 mg (06/01/19 1300)   PRN Meds:.sodium chloride, acetaminophen, chlorpheniramine-HYDROcodone, clonazePAM, guaiFENesin-dextromethorphan, hydrALAZINE, ondansetron **OR** ondansetron (ZOFRAN) IV, traZODone   Time Spent in minutes  25  See all Orders from today for further details   Oren Binet M.D on 06/01/2019 at 3:40 PM  To page go to [email or web address] - use universal password  [facility] -  Office  [phone]    Objective:   Vitals:   06/01/19 0800 06/01/19 1200 06/01/19 1300 06/01/19 1448  BP: (!) 161/67 (!) 170/113 (!) 153/61 (!) 169/67  Pulse: 83 (!) 104  84  Resp: (!) 22 (!) 32  (!) 21  Temp:  (!) 97.4 F (36.3 C)  97.6 F (36.4 C)  TempSrc:  Oral  Oral  SpO2: 96% 97%  98%  Weight:      Height:        Wt Readings from Last 3 Encounters:  05/31/19 57.4 kg  05/27/19 56.2 kg  12/01/17 55.8 kg     Intake/Output Summary (Last 24 hours) at 06/01/2019 1540 Last data filed at 06/01/2019 1510 Gross per 24 hour  Intake 549.17 ml  Output 2850 ml  Net -2300.83 ml     Physical Exam Gen Exam:Alert awake-not in any distress HEENT:atraumatic, normocephalic Chest: B/L clear to auscultation anteriorly-some scattered rhonchi CVS:S1S2 regular Abdomen:soft non tender, non distended Extremities:no edema Neurology: Non focal Skin: no rash   Data Review:    CBC Recent Labs  Lab 05/27/19 1858 05/30/19 2100 05/31/19 0036 06/01/19 0301  WBC 4.5 8.0 6.7 7.2  HGB 10.9* 11.1* 11.6* 11.5*  HCT 34.8* 35.0* 35.8* 35.3*  PLT 175 180 163 199  MCV 97.2 97.2 96.0 94.9  MCH 30.4 30.8 31.1 30.9  MCHC 31.3 31.7 32.4 32.6  RDW 12.9 12.6 12.8 12.8  LYMPHSABS  --  1.9 0.5* 0.9  MONOABS  --  0.7 0.2 0.4  EOSABS  --  0.1 0.0 0.0  BASOSABS  --  0.0 0.0 0.0    Chemistries  Recent Labs  Lab 05/27/19 1858 05/30/19 2100 05/31/19 0036 06/01/19 0301  NA 139 140 138 141  K 3.8  3.9 4.1 3.9  CL 102 103 103 107  CO2 28 25 24  20*  GLUCOSE 119* 185* 263* 244*  BUN 17 13 15 17   CREATININE 0.96 1.04* 0.98 0.83  CALCIUM 8.8* 8.9 8.8* 9.0  AST 20 13* 17 19  ALT 22 16 19 19   ALKPHOS 42 37* 44 40  BILITOT 0.4 0.7 0.7 0.9   ------------------------------------------------------------------------------------------------------------------ Recent Labs    05/30/19 2052  TRIG 162*    Lab Results  Component Value Date   HGBA1C 5.4 10/23/2015   ------------------------------------------------------------------------------------------------------------------ No results for input(s): TSH, T4TOTAL, T3FREE, THYROIDAB in the last 72 hours.  Invalid input(s): FREET3 ------------------------------------------------------------------------------------------------------------------ Recent Labs    05/31/19 0036 06/01/19 0301  FERRITIN 54 64    Coagulation profile No results for input(s): INR, PROTIME in the last 168 hours.  Recent Labs  05/31/19 0036 06/01/19 0301  DDIMER 0.97* 0.87*    Cardiac Enzymes No results for input(s): CKMB, TROPONINI, MYOGLOBIN in the last 168 hours.  Invalid input(s): CK ------------------------------------------------------------------------------------------------------------------    Component Value Date/Time   BNP 74.0 05/27/2019 1858    Micro Results Recent Results (from the past 240 hour(s))  Respiratory Panel by RT PCR (Flu A&B, Covid) - Nasopharyngeal Swab     Status: Abnormal   Collection Time: 05/27/19  7:22 PM   Specimen: Nasopharyngeal Swab  Result Value Ref Range Status   SARS Coronavirus 2 by RT PCR POSITIVE (A) NEGATIVE Final    Comment: RESULT CALLED TO, READ BACK BY AND VERIFIED WITH: RIANA CHAPMAN 05/27/19 AT 2034 HS    Influenza A by PCR NEGATIVE NEGATIVE Final   Influenza B by PCR NEGATIVE NEGATIVE Final    Comment: (NOTE) The Xpert Xpress SARS-CoV-2/FLU/RSV assay is intended as an aid in  the  diagnosis of influenza from Nasopharyngeal swab specimens and  should not be used as a sole basis for treatment. Nasal washings and  aspirates are unacceptable for Xpert Xpress SARS-CoV-2/FLU/RSV  testing. Fact Sheet for Patients: PinkCheek.be Fact Sheet for Healthcare Providers: GravelBags.it This test is not yet approved or cleared by the [place] FDA and  has been authorized for detection and/or diagnosis of SARS-CoV-2 by  FDA under an Emergency Use Authorization (EUA). This EUA will remain  in effect (meaning this test can be used) for the duration of the  Covid-19 declaration under Section 564(b)(1) of the Act, 21  U.S.C. section 360bbb-3(b)(1), unless the authorization is  terminated or revoked. Performed at [facility], [address] [place], [place] [address]   Blood Culture (routine x 2)     Status: Abnormal (Preliminary result)   Collection Time: 05/30/19  9:00 PM   Specimen: BLOOD LEFT ARM  Result Value Ref Range Status   Specimen Description BLOOD LEFT ARM  Final   Special Requests   Final    BOTTLES DRAWN AEROBIC AND ANAEROBIC Blood Culture adequate volume   Culture  Setup Time   Final    ANAEROBIC BOTTLE ONLY GRAM POSITIVE COCCI IN CLUSTERS CRITICAL RESULT CALLED TO, READ BACK BY AND VERIFIED WITH: K PIERCE PHARMD 05/31/19 1807 JDW    Culture (A)  Final    STAPHYLOCOCCUS SPECIES (COAGULASE NEGATIVE) THE SIGNIFICANCE OF ISOLATING THIS ORGANISM FROM A SINGLE SET OF BLOOD CULTURES WHEN MULTIPLE SETS ARE DRAWN IS UNCERTAIN. PLEASE NOTIFY THE MICROBIOLOGY DEPARTMENT WITHIN ONE WEEK IF SPECIATION AND SENSITIVITIES ARE REQUIRED. Performed at [facility], [place] [address] [place], [place] [address]    Report Status PENDING  Incomplete  Blood Culture (routine x 2)     Status: None (Preliminary result)   Collection Time: 05/30/19  9:00 PM   Specimen: BLOOD RIGHT ARM  Result Value Ref Range Status    Specimen Description BLOOD RIGHT ARM  Final   Special Requests   Final    BOTTLES DRAWN AEROBIC AND ANAEROBIC Blood Culture adequate volume   Culture   Final    NO GROWTH 2 DAYS Performed at [facility], [place] [address] [place], [place] [address]    Report Status PENDING  Incomplete  MRSA PCR Screening     Status: None   Collection Time: 05/31/19  1:28 AM   Specimen: Nasal Mucosa; Nasopharyngeal  Result Value Ref Range Status   MRSA by PCR NEGATIVE NEGATIVE Final    Comment:  The GeneXpert MRSA Assay (FDA approved for NASAL specimens only), is one component of a comprehensive MRSA colonization surveillance program. It is not intended to diagnose MRSA infection nor to guide or monitor treatment for MRSA infections. Performed at [facility], [place] [address] [place],  [address]     Radiology Reports DG Chest [place] 1 View  Result Date: 05/30/2019 CLINICAL DATA:  73 year old female with respiratory distress. Positive COVID-19. EXAM: PORTABLE CHEST 1 VIEW COMPARISON:  Chest radiograph dated 05/27/2019. FINDINGS: Background of emphysema. No focal consolidation, pleural effusion, pneumothorax. The cardiac silhouette is within normal limits. Atherosclerotic calcification of the aorta. No acute osseous pathology. Cervical ACDF. IMPRESSION: 1. No acute cardiopulmonary process. 2. Emphysema. Electronically Signed   By: Anner Crete M.D.   On: 05/30/2019 21:04   DG Chest Port 1 View  Result Date: 05/27/2019 CLINICAL DATA:  Difficulty breathing EXAM: PORTABLE CHEST 1 VIEW COMPARISON:  09/02/2017 FINDINGS: Cardiac shadows within normal limits. Aortic calcifications are noted. Lungs are well aerated bilaterally. Mild scarring is noted in the bases bilaterally. No bony abnormality is seen. IMPRESSION: Chronic changes without acute abnormality. Electronically Signed   By: Inez Catalina M.D.   On: 05/27/2019 19:37

## 2019-06-02 LAB — CBC WITH DIFFERENTIAL/PLATELET
Abs Immature Granulocytes: 0.06 10*3/uL (ref 0.00–0.07)
Basophils Absolute: 0 10*3/uL (ref 0.0–0.1)
Basophils Relative: 0 %
Eosinophils Absolute: 0 10*3/uL (ref 0.0–0.5)
Eosinophils Relative: 0 %
HCT: 37.3 % (ref 36.0–46.0)
Hemoglobin: 11.8 g/dL — ABNORMAL LOW (ref 12.0–15.0)
Immature Granulocytes: 1 %
Lymphocytes Relative: 10 %
Lymphs Abs: 0.8 10*3/uL (ref 0.7–4.0)
MCH: 30.5 pg (ref 26.0–34.0)
MCHC: 31.6 g/dL (ref 30.0–36.0)
MCV: 96.4 fL (ref 80.0–100.0)
Monocytes Absolute: 0.2 10*3/uL (ref 0.1–1.0)
Monocytes Relative: 3 %
Neutro Abs: 6.4 10*3/uL (ref 1.7–7.7)
Neutrophils Relative %: 86 %
Platelets: 244 10*3/uL (ref 150–400)
RBC: 3.87 MIL/uL (ref 3.87–5.11)
RDW: 13 % (ref 11.5–15.5)
WBC: 7.5 10*3/uL (ref 4.0–10.5)
nRBC: 0 % (ref 0.0–0.2)

## 2019-06-02 LAB — COMPREHENSIVE METABOLIC PANEL
ALT: 21 U/L (ref 0–44)
AST: 18 U/L (ref 15–41)
Albumin: 3.6 g/dL (ref 3.5–5.0)
Alkaline Phosphatase: 40 U/L (ref 38–126)
Anion gap: 8 (ref 5–15)
BUN: 21 mg/dL (ref 8–23)
CO2: 24 mmol/L (ref 22–32)
Calcium: 8.9 mg/dL (ref 8.9–10.3)
Chloride: 108 mmol/L (ref 98–111)
Creatinine, Ser: 0.93 mg/dL (ref 0.44–1.00)
GFR calc Af Amer: >60 mL/min (ref >60)
GFR calc non Af Amer: >60 mL/min (ref >60)
Glucose, Bld: 224 mg/dL — ABNORMAL HIGH (ref 70–99)
Potassium: 4.2 mmol/L (ref 3.5–5.1)
Sodium: 140 mmol/L (ref 135–145)
Total Bilirubin: 0.7 mg/dL (ref 0.3–1.2)
Total Protein: 6.3 g/dL — ABNORMAL LOW (ref 6.5–8.1)

## 2019-06-02 LAB — C-REACTIVE PROTEIN: CRP: 0.5 mg/dL (ref <1.0)

## 2019-06-02 LAB — D-DIMER, QUANTITATIVE: D-Dimer, Quant: 0.71 ug/mL-FEU — ABNORMAL HIGH (ref 0.00–0.50)

## 2019-06-02 LAB — FERRITIN: Ferritin: 92 ng/mL (ref 11–307)

## 2019-06-02 LAB — GLUCOSE, CAPILLARY
Glucose-Capillary: 214 mg/dL — ABNORMAL HIGH (ref 70–99)
Glucose-Capillary: 221 mg/dL — ABNORMAL HIGH (ref 70–99)
Glucose-Capillary: 213 mg/dL — ABNORMAL HIGH (ref 70–99)
Glucose-Capillary: 115 mg/dL — ABNORMAL HIGH (ref 70–99)

## 2019-06-02 MED ORDER — METHYLPREDNISOLONE SODIUM SUCC 40 MG IJ SOLR
40.0000 mg | Freq: Two times a day (BID) | INTRAMUSCULAR | Status: DC
  Administered 2019-06-02 – 2019-06-03 (×2): 40 mg via INTRAVENOUS
  Filled 2019-06-02 (×2): qty 1

## 2019-06-02 MED ORDER — FAMOTIDINE 20 MG PO TABS
20.0000 mg | ORAL_TABLET | Freq: Every day | ORAL | Status: DC
  Administered 2019-06-02 – 2019-06-03 (×2): 20 mg via ORAL
  Filled 2019-06-02 (×2): qty 1

## 2019-06-02 MED ORDER — ALUM & MAG HYDROXIDE-SIMETH 200-200-20 MG/5ML PO SUSP
30.0000 mL | Freq: Four times a day (QID) | ORAL | Status: DC | PRN
  Administered 2019-06-02: 30 mL via ORAL
  Filled 2019-06-02: qty 30

## 2019-06-02 NOTE — Progress Notes (Signed)
Inpatient Diabetes Program Recommendations  AACE/ADA: New Consensus Statement on Inpatient Glycemic Control (2015)  Target Ranges:  Prepandial:   less than 140 mg/dL      Peak postprandial:   less than 180 mg/dL (1-2 hours)      Critically ill patients:  140 - 180 mg/dL   Lab Results  Component Value Date   GLUCAP 221 (H) 06/02/2019   HGBA1C 5.4 10/23/2015    Review of Glycemic Control Results for Kristin [name], Kristin [name] (MRN [id number]) as of 06/02/2019 12:45  Ref. Range 06/01/2019 12:25 06/01/2019 16:44 06/01/2019 21:53 06/02/2019 07:59 06/02/2019 11:58  Glucose-Capillary Latest Ref Range: 70 - 99 mg/dL 160 (H) 155 (H) 189 (H) 214 (H) 221 (H)  Diabetes history: DM 2 Outpatient Diabetes medications: Metformin 500 mg bid Current orders for Inpatient glycemic control:  Novolog 0-20 units tid + hs Tradjenta 5 mg Daily Solumedrol 40 mg Q6 hours  Inpatient Diabetes Program Recommendations:    Please consider adding basal insulin, Levemir 6 units daily.   Thanks  Adah Perl, RN, BC-ADM Inpatient Diabetes Coordinator Pager [phone] (8a-5p)

## 2019-06-02 NOTE — Progress Notes (Addendum)
PROGRESS NOTE                                                                                                                                                                                                             Patient Demographics:    Kristin [name], is a 74 y.o. female, DOB - [birth year], [id number]  Outpatient Primary MD for the patient is Glendon Axe, MD   Admit date - 05/30/2019   LOS - 3  Chief Complaint  Patient presents with  . Respiratory Distress       Brief Narrative: Patient is a 74 y.o. female with PMHx of COPD with chronic hypoxic respiratory failure on 4 L of oxygen at home, HTN, DM, HLD, prior CVA who presented with shortness of breath and tachypnea-found to have acute on chronic hypoxic respiratory failure secondary to COPD exacerbation and admitted to the hospitalist service.  See below for further details.  Note-Per prior chart review-received second dose of Covid vaccine on 3/1.  Significant Events: 3/15>> admit to [facility]-for acute on chronic hypoxic respiratory failure requiring 15 L of high flow oxygen. 3/16>> weaned to 4 L.  Microbiology data: 3/17>> blood cultures: neg so far 3/15>> blood cultures 1/2-coag negative staph (likely contaminant) 3/12>> COVID-19 positive  COVID-19 medications: 3/15>> remdesivir  DVT prophylaxis: Lovenox  Procedures: None  Consults: None   Subjective:   Much better today-anxiety is better controlled.  Claims she was finally able to sleep approximately 3 hours yesterday.   Assessment  & Plan :   Acute on chronic hypoxic respiratory failure secondary to COPD exacerbation: Much better-anxiety playing a significant part in her complaints yesterday-improved after starting some Klonopin.  Start tapering steroids-continue bronchodilators.  She is back on her usual regimen of 4 L of oxygen.  Continue with as needed Klonopin.  COVID-19 infection: Has  been started on remdesivir given COPD exacerbation-we will complete remdesivir on 3/19.   Fever: afebrile  O2 requirements:  SpO2: 95 % O2 Flow Rate (L/min): 4 L/min FiO2 (%): 40 %   COVID-19 Labs: Recent Labs    05/30/19 2100 05/30/19 2100 05/31/19 0036 06/01/19 0301 06/02/19 0656  DDIMER 0.79*   < > 0.97* 0.87* 0.71*  FERRITIN 48   < > 54 64 92  LDH 116  --   --   --   --  CRP 1.6*   < > 2.6* 1.0* 0.5   < > = values in this interval not displayed.       Component Value Date/Time   BNP 74.0 05/27/2019 1858    Recent Labs  Lab 05/30/19 2100  PROCALCITON <0.10    Lab Results  Component Value Date   SARSCOV2NAA POSITIVE (A) 05/27/2019     Prone/Incentive Spirometry: encouraged  incentive spirometry use 3-4/hour.  Coag negative staph bacteremia: Likely a contaminant-repeat blood cultures on 3/17 neg.  No longer on IV vancomycin.    HTN: Controlled-continue amlodipine, hydralazine, losartan  HLD: Continue Lipitor and fenofibrate  DM-2: CBGs stable likely on the higher side-steroid dosage being tapered-for now continue with SSI and Tradjenta.   Recent Labs    06/01/19 2153 06/02/19 0759 06/02/19 1158  GLUCAP 189* 214* 221*    ABG:    Component Value Date/Time   HCO3 25.8 09/02/2017 0939   TCO2 29 01/07/2010 2208   O2SAT 85.1 09/02/2017 0939    Vent Settings: N/A  Condition - Stable  Family Communication  :  Spouse updated over the phone on 3/18  Code Status :  Full Code  Diet :  Diet Order            Diet heart healthy/carb modified Room service appropriate? Yes; Fluid consistency: Thin  Diet effective now               Disposition Plan  :  Remain hospitalized-probably home-may require home health  Barriers to discharge: Slowly improving COPD exacerbation-remains on IV steroids  Antimicorbials  :    Anti-infectives (From admission, onward)   Start     Dose/Rate Route Frequency Ordered Stop   05/31/19 1900  vancomycin (VANCOREADY)  IVPB 750 mg/150 mL  Status:  Discontinued     750 mg 150 mL/hr over 60 Minutes Intravenous Every 24 hours 05/31/19 1847 06/01/19 1341   05/31/19 1000  remdesivir 100 mg in sodium chloride 0.9 % 100 mL IVPB  Status:  Discontinued     100 mg 200 mL/hr over 30 Minutes Intravenous Daily 05/31/19 0029 05/31/19 0038   05/31/19 1000  remdesivir 100 mg in sodium chloride 0.9 % 100 mL IVPB     100 mg 200 mL/hr over 30 Minutes Intravenous Daily 05/30/19 2224 06/04/19 0959   05/31/19 0030  remdesivir 200 mg in sodium chloride 0.9% 250 mL IVPB  Status:  Discontinued     200 mg 580 mL/hr over 30 Minutes Intravenous Once 05/31/19 0029 05/31/19 0038   05/31/19 0027  azithromycin (ZITHROMAX) 500 mg in sodium chloride 0.9 % 250 mL IVPB     500 mg 250 mL/hr over 60 Minutes Intravenous Every 24 hours 05/31/19 0029     05/30/19 2230  remdesivir 100 mg in sodium chloride 0.9 % 100 mL IVPB     100 mg 200 mL/hr over 30 Minutes Intravenous Every 30 min 05/30/19 2224 05/31/19 0007      Inpatient Medications  Scheduled Meds: . amLODipine  10 mg Oral Daily  . vitamin C  500 mg Oral Daily  . aspirin EC  81 mg Oral Daily  . atorvastatin  40 mg Oral Daily  . enoxaparin (LOVENOX) injection  40 mg Subcutaneous Q24H  . famotidine  20 mg Oral Daily  . fenofibrate  160 mg Oral Daily  . fluticasone  1-2 spray Each Nare q AM  . hydrALAZINE  25 mg Oral Q6H  . insulin aspart  0-20 Units Subcutaneous TID  WC  . insulin aspart  0-5 Units Subcutaneous QHS  . Ipratropium-Albuterol  1 puff Inhalation TID  . linagliptin  5 mg Oral Daily  . losartan  100 mg Oral Daily  . mouth rinse  15 mL Mouth Rinse BID  . methylPREDNISolone (SOLU-MEDROL) injection  40 mg Intravenous Q6H  . mometasone-formoterol  2 puff Inhalation BID  . pramipexole  1 mg Oral QHS  . zinc sulfate  220 mg Oral Daily   Continuous Infusions: . sodium chloride 10 mL/hr (06/02/19 0008)  . azithromycin 500 mg (06/02/19 0006)  . remdesivir 100 mg in NS  100 mL 100 mg (06/02/19 0842)   PRN Meds:.sodium chloride, acetaminophen, chlorpheniramine-HYDROcodone, clonazePAM, guaiFENesin-dextromethorphan, hydrALAZINE, ondansetron **OR** ondansetron (ZOFRAN) IV, traZODone   Time Spent in minutes  25  See all Orders from today for further details   Oren Binet M.D on 06/02/2019 at 3:05 PM  To page go to [email or web address] - use universal password  [facility] -  Office  [phone]    Objective:   Vitals:   06/02/19 0005 06/02/19 0500 06/02/19 0800 06/02/19 1203  BP: 137/81 133/87 (!) 156/66 (!) 157/59  Pulse: 76 77 73 84  Resp: (!) 21 (!) 24 (!) 23 18  Temp: 97.7 F (36.5 C)  98.8 F (37.1 C) 98.6 F (37 C)  TempSrc: Oral  Oral Oral  SpO2: 97% 98% 99% 95%  Weight:      Height:        Wt Readings from Last 3 Encounters:  05/31/19 57.4 kg  05/27/19 56.2 kg  12/01/17 55.8 kg     Intake/Output Summary (Last 24 hours) at 06/02/2019 1505 Last data filed at 06/01/2019 1510 Gross per 24 hour  Intake --  Output 700 ml  Net -700 ml     Physical Exam Gen Exam:Alert awake-not in any distress.  Less anxious. HEENT:atraumatic, normocephalic Chest: B/L clear to auscultation anteriorly-continues to have some scattered rhonchi. CVS:S1S2 regular Abdomen:soft non tender, non distended Extremities:no edema Neurology: Non focal Skin: no rash   Data Review:    CBC Recent Labs  Lab 05/27/19 1858 05/30/19 2100 05/31/19 0036 06/01/19 0301 06/02/19 0656  WBC 4.5 8.0 6.7 7.2 7.5  HGB 10.9* 11.1* 11.6* 11.5* 11.8*  HCT 34.8* 35.0* 35.8* 35.3* 37.3  PLT 175 180 163 199 244  MCV 97.2 97.2 96.0 94.9 96.4  MCH 30.4 30.8 31.1 30.9 30.5  MCHC 31.3 31.7 32.4 32.6 31.6  RDW 12.9 12.6 12.8 12.8 13.0  LYMPHSABS  --  1.9 0.5* 0.9 0.8  MONOABS  --  0.7 0.2 0.4 0.2  EOSABS  --  0.1 0.0 0.0 0.0  BASOSABS  --  0.0 0.0 0.0 0.0    Chemistries  Recent Labs  Lab 05/27/19 1858 05/30/19 2100 05/31/19 0036 06/01/19 0301  06/02/19 0656  NA 139 140 138 141 140  K 3.8 3.9 4.1 3.9 4.2  CL 102 103 103 107 108  CO2 28 25 24  20* 24  GLUCOSE 119* 185* 263* 244* 224*  BUN 17 13 15 17 21   CREATININE 0.96 1.04* 0.98 0.83 0.93  CALCIUM 8.8* 8.9 8.8* 9.0 8.9  AST 20 13* 17 19 18   ALT 22 16 19 19 21   ALKPHOS 42 37* 44 40 40  BILITOT 0.4 0.7 0.7 0.9 0.7   ------------------------------------------------------------------------------------------------------------------ Recent Labs    05/30/19 2052  TRIG 162*    Lab Results  Component Value Date   HGBA1C 5.4 10/23/2015   ------------------------------------------------------------------------------------------------------------------ No results for input(s):  TSH, T4TOTAL, T3FREE, THYROIDAB in the last 72 hours.  Invalid input(s): FREET3 ------------------------------------------------------------------------------------------------------------------ Recent Labs    06/01/19 0301 06/02/19 0656  FERRITIN 64 92    Coagulation profile No results for input(s): INR, PROTIME in the last 168 hours.  Recent Labs    06/01/19 0301 06/02/19 0656  DDIMER 0.87* 0.71*    Cardiac Enzymes No results for input(s): CKMB, TROPONINI, MYOGLOBIN in the last 168 hours.  Invalid input(s): CK ------------------------------------------------------------------------------------------------------------------    Component Value Date/Time   BNP 74.0 05/27/2019 1858    Micro Results Recent Results (from the past 240 hour(s))  Respiratory Panel by RT PCR (Flu A&B, Covid) - Nasopharyngeal Swab     Status: Abnormal   Collection Time: 05/27/19  7:22 PM   Specimen: Nasopharyngeal Swab  Result Value Ref Range Status   SARS Coronavirus 2 by RT PCR POSITIVE (A) NEGATIVE Final    Comment: RESULT CALLED TO, READ BACK BY AND VERIFIED WITH: RIANA CHAPMAN 05/27/19 AT 2034 HS    Influenza A by PCR NEGATIVE NEGATIVE Final   Influenza B by PCR NEGATIVE NEGATIVE Final    Comment:  (NOTE) The Xpert Xpress SARS-CoV-2/FLU/RSV assay is intended as an aid in  the diagnosis of influenza from Nasopharyngeal swab specimens and  should not be used as a sole basis for treatment. Nasal washings and  aspirates are unacceptable for Xpert Xpress SARS-CoV-2/FLU/RSV  testing. Fact Sheet for Patients: PinkCheek.be Fact Sheet for Healthcare Providers: GravelBags.it This test is not yet approved or cleared by the [place] FDA and  has been authorized for detection and/or diagnosis of SARS-CoV-2 by  FDA under an Emergency Use Authorization (EUA). This EUA will remain  in effect (meaning this test can be used) for the duration of the  Covid-19 declaration under Section 564(b)(1) of the Act, 21  U.S.C. section 360bbb-3(b)(1), unless the authorization is  terminated or revoked. Performed at [facility], [address] [place], [place] [address]   Blood Culture (routine x 2)     Status: Abnormal   Collection Time: 05/30/19  9:00 PM   Specimen: BLOOD LEFT ARM  Result Value Ref Range Status   Specimen Description BLOOD LEFT ARM  Final   Special Requests   Final    BOTTLES DRAWN AEROBIC AND ANAEROBIC Blood Culture adequate volume   Culture  Setup Time   Final    ANAEROBIC BOTTLE ONLY GRAM POSITIVE COCCI IN CLUSTERS CRITICAL RESULT CALLED TO, READ BACK BY AND VERIFIED WITH: K PIERCE PHARMD 05/31/19 1807 JDW    Culture (A)  Final    STAPHYLOCOCCUS SPECIES (COAGULASE NEGATIVE) THE SIGNIFICANCE OF ISOLATING THIS ORGANISM FROM A SINGLE SET OF BLOOD CULTURES WHEN MULTIPLE SETS ARE DRAWN IS UNCERTAIN. PLEASE NOTIFY THE MICROBIOLOGY DEPARTMENT WITHIN ONE WEEK IF SPECIATION AND SENSITIVITIES ARE REQUIRED. Performed at [facility], [place] [address], [place], [place] [address]    Report Status 06/02/2019 FINAL  Final  Blood Culture (routine x 2)     Status: None (Preliminary result)   Collection Time: 05/30/19  9:00  PM   Specimen: BLOOD RIGHT ARM  Result Value Ref Range Status   Specimen Description BLOOD RIGHT ARM  Final   Special Requests   Final    BOTTLES DRAWN AEROBIC AND ANAEROBIC Blood Culture adequate volume   Culture   Final    NO GROWTH 3 DAYS Performed at [facility], [place] [address] [place], [place] [address]    Report Status PENDING  Incomplete  MRSA PCR Screening     Status: None   Collection Time: 05/31/19  1:28 AM   Specimen: Nasal Mucosa; Nasopharyngeal  Result Value Ref Range Status   MRSA by PCR NEGATIVE NEGATIVE Final    Comment:        The GeneXpert MRSA Assay (FDA approved for NASAL specimens only), is one component of a comprehensive MRSA colonization surveillance program. It is not intended to diagnose MRSA infection nor to guide or monitor treatment for MRSA infections. Performed at [facility], [place] [address], [place], [place] [address]   Culture, blood (routine x 2)     Status: None (Preliminary result)   Collection Time: 06/01/19 11:05 AM   Specimen: BLOOD RIGHT HAND  Result Value Ref Range Status   Specimen Description BLOOD RIGHT HAND  Final   Special Requests   Final    BOTTLES DRAWN AEROBIC AND ANAEROBIC Blood Culture adequate volume   Culture   Final    NO GROWTH < 24 HOURS Performed at [facility], [place] [address] [place], [place] [address]    Report Status PENDING  Incomplete  Culture, blood (routine x 2)     Status: None (Preliminary result)   Collection Time: 06/01/19 11:13 AM   Specimen: BLOOD LEFT FOREARM  Result Value Ref Range Status   Specimen Description BLOOD LEFT FOREARM  Final   Special Requests   Final    BOTTLES DRAWN AEROBIC AND ANAEROBIC Blood Culture adequate volume   Culture   Final    NO GROWTH < 24 HOURS Performed at [facility], [place] [address], [place],  [address]    Report Status PENDING  Incomplete    Radiology Reports DG Chest Port 1 View  Result Date: 05/30/2019 CLINICAL DATA:   74 year old female with respiratory distress. Positive COVID-19. EXAM: PORTABLE CHEST 1 VIEW COMPARISON:  Chest radiograph dated 05/27/2019. FINDINGS: Background of emphysema. No focal consolidation, pleural effusion, pneumothorax. The cardiac silhouette is within normal limits. Atherosclerotic calcification of the aorta. No acute osseous pathology. Cervical ACDF. IMPRESSION: 1. No acute cardiopulmonary process. 2. Emphysema. Electronically Signed   By: Anner Crete M.D.   On: 05/30/2019 21:04   DG Chest Port 1 View  Result Date: 05/27/2019 CLINICAL DATA:  Difficulty breathing EXAM: PORTABLE CHEST 1 VIEW COMPARISON:  09/02/2017 FINDINGS: Cardiac shadows within normal limits. Aortic calcifications are noted. Lungs are well aerated bilaterally. Mild scarring is noted in the bases bilaterally. No bony abnormality is seen. IMPRESSION: Chronic changes without acute abnormality. Electronically Signed   By: Inez Catalina M.D.   On: 05/27/2019 19:37

## 2019-06-02 NOTE — TOC Initial Note (Signed)
Transition of Care [facility]) - Initial/Assessment Note    Patient Details  Name: Kristin [name] MRN: [id number] Date of Birth: Jul 23, 1945  Transition of Care [facility]) CM/SW Contact:    Maryclare Labrador, RN Phone Number: 06/02/2019, 4:03 PM  Clinical Narrative:   PTA independent from home with spouse.  Pt informed CM that she has PCP and denied barriers with paying for discharge medications.  Pt informed CM that if [facility] Is recommended she is interested in [facility].  PT ordered however has not yet evaluated pt                Expected Discharge Plan: [place] Barriers to Discharge: Continued Medical Work up   Patient Goals and CMS Choice        Expected Discharge Plan and Services Expected Discharge Plan: [place]                                              Prior Living Arrangements/Services   Lives with:: Spouse Patient language and need for interpreter reviewed:: Yes Do you feel safe going back to the place where you live?: Yes      Need for Family Participation in Patient Care: Yes (Comment) Care giver support system in place?: Yes (comment)   Criminal Activity/Legal Involvement Pertinent to Current Situation/Hospitalization: No - Comment as needed  Activities of Daily Living Home Assistive Devices/Equipment: Blood pressure cuff, Nebulizer, Oxygen, Other (Comment), CBG Meter(pulse ox) ADL Screening (condition at time of admission) Patient's cognitive ability adequate to safely complete daily activities?: Yes Is the patient deaf or have difficulty hearing?: No Does the patient have difficulty seeing, even when wearing glasses/contacts?: No Does the patient have difficulty concentrating, remembering, or making decisions?: No Patient able to express need for assistance with ADLs?: Yes Does the patient have difficulty dressing or bathing?: Yes Independently performs ADLs?: Yes (appropriate for developmental age) Does the  patient have difficulty walking or climbing stairs?: Yes Weakness of Legs: Both Weakness of Arms/Hands: None  Permission Sought/Granted   Permission granted to share information with : Yes, Verbal Permission Granted              Emotional Assessment   Attitude/Demeanor/Rapport: Engaged, Self-Confident Affect (typically observed): Accepting Orientation: : Oriented to Self, Oriented to Place, Oriented to  Time, Oriented to Situation   Psych Involvement: No (comment)  Admission diagnosis:  Acute respiratory failure with hypoxia (HCC) [J96.01] Acute respiratory failure due to COVID-19 ([place]) [U07.1, J96.00] COVID-19 virus infection [U07.1] Patient Active Problem List   Diagnosis Date Noted  . Acute respiratory failure due to COVID-19 ([place]) 05/30/2019  . Carotid stenosis 10/26/2017  . Diabetes ([place]) 10/26/2017  . Essential hypertension 10/26/2017  . COPD (chronic obstructive pulmonary disease) ([place]) 10/26/2017  . Pyelonephritis 09/02/2017  . UTI (urinary tract infection) 11/30/2016  . Encephalopathy acute 11/30/2016  . Chronic respiratory failure with hypoxia ([place]) 11/30/2016  . Weakness generalized 11/30/2016  . Acute encephalopathy 11/30/2016  . Acute respiratory failure ([place]) 05/23/2016  . Malnutrition of moderate degree 05/23/2016  . Syncope 10/23/2015   PCP:  Glendon Axe, MD Pharmacy:   [facility] [address], [place] - [place] [address] [place] [address] Phone: [phone] Fax: [phone]     Social Determinants of Health (SDOH) Interventions    Readmission Risk Interventions No flowsheet  data found.

## 2019-06-03 DIAGNOSIS — E119 Type 2 diabetes mellitus without complications: Secondary | ICD-10-CM

## 2019-06-03 LAB — CBC WITH DIFFERENTIAL/PLATELET
Abs Immature Granulocytes: 0.1 10*3/uL — ABNORMAL HIGH (ref 0.00–0.07)
Basophils Absolute: 0 10*3/uL (ref 0.0–0.1)
Basophils Relative: 0 %
Eosinophils Absolute: 0 10*3/uL (ref 0.0–0.5)
Eosinophils Relative: 0 %
HCT: 37.7 % (ref 36.0–46.0)
Hemoglobin: 11.8 g/dL — ABNORMAL LOW (ref 12.0–15.0)
Immature Granulocytes: 1 %
Lymphocytes Relative: 14 %
Lymphs Abs: 1.2 10*3/uL (ref 0.7–4.0)
MCH: 30.2 pg (ref 26.0–34.0)
MCHC: 31.3 g/dL (ref 30.0–36.0)
MCV: 96.4 fL (ref 80.0–100.0)
Monocytes Absolute: 0.3 10*3/uL (ref 0.1–1.0)
Monocytes Relative: 4 %
Neutro Abs: 7.5 10*3/uL (ref 1.7–7.7)
Neutrophils Relative %: 81 %
Platelets: 282 10*3/uL (ref 150–400)
RBC: 3.91 MIL/uL (ref 3.87–5.11)
RDW: 13 % (ref 11.5–15.5)
WBC: 9.1 10*3/uL (ref 4.0–10.5)
nRBC: 0 % (ref 0.0–0.2)

## 2019-06-03 LAB — COMPREHENSIVE METABOLIC PANEL
ALT: 19 U/L (ref 0–44)
AST: 16 U/L (ref 15–41)
Albumin: 3.6 g/dL (ref 3.5–5.0)
Alkaline Phosphatase: 38 U/L (ref 38–126)
Anion gap: 11 (ref 5–15)
BUN: 22 mg/dL (ref 8–23)
CO2: 24 mmol/L (ref 22–32)
Calcium: 8.8 mg/dL — ABNORMAL LOW (ref 8.9–10.3)
Chloride: 104 mmol/L (ref 98–111)
Creatinine, Ser: 0.97 mg/dL (ref 0.44–1.00)
GFR calc Af Amer: >60 mL/min (ref >60)
GFR calc non Af Amer: 58 mL/min — ABNORMAL LOW (ref >60)
Glucose, Bld: 273 mg/dL — ABNORMAL HIGH (ref 70–99)
Potassium: 4.2 mmol/L (ref 3.5–5.1)
Sodium: 139 mmol/L (ref 135–145)
Total Bilirubin: 0.7 mg/dL (ref 0.3–1.2)
Total Protein: 5.9 g/dL — ABNORMAL LOW (ref 6.5–8.1)

## 2019-06-03 LAB — GLUCOSE, CAPILLARY
Glucose-Capillary: 225 mg/dL — ABNORMAL HIGH (ref 70–99)
Glucose-Capillary: 167 mg/dL — ABNORMAL HIGH (ref 70–99)

## 2019-06-03 MED ORDER — HYDRALAZINE HCL 50 MG PO TABS: 50.0000 mg | ORAL_TABLET | Freq: Two times a day (BID) | ORAL | 0 refills | Status: DC

## 2019-06-03 MED ORDER — GUAIFENESIN-DM 100-10 MG/5ML PO SYRP: 10.0000 mL | ORAL_SOLUTION | ORAL | 0 refills | Status: DC | PRN

## 2019-06-03 MED ORDER — FAMOTIDINE 20 MG PO TABS: 20.0000 mg | ORAL_TABLET | Freq: Every day | ORAL | 0 refills | Status: DC

## 2019-06-03 MED ORDER — AMLODIPINE BESYLATE 10 MG PO TABS: 10.0000 mg | ORAL_TABLET | Freq: Every day | ORAL | 0 refills | Status: DC

## 2019-06-03 MED ORDER — PREDNISONE 10 MG PO TABS: ORAL_TABLET | ORAL | 0 refills | Status: DC

## 2019-06-03 NOTE — Discharge Summary (Signed)
PATIENT DETAILS Name: Kristin [name] Age: 74 y.o. Sex: female Date of Birth: 1946/03/09 MRN: [id number]. Admitting Physician: Elwyn Reach, MD [id number], Delana Meyer, MD  Admit Date: 05/30/2019 Discharge date: 06/03/2019  Recommendations for Outpatient Follow-up:  1. Follow up with PCP in 1-2 weeks 2. Please obtain CMP/CBC in one week 3. Follow blood cultures until negative  Admitted From:  Home  Disposition: Home with home health services   [place]:  Yes  Equipment/Devices: Resume oxygen 4L  Discharge Condition: Stable  CODE STATUS: FULL CODE  Diet recommendation:  Diet Order            Diet - low sodium heart healthy        Diet Carb Modified        Diet heart healthy/carb modified Room service appropriate? Yes; Fluid consistency: Thin  Diet effective now               Brief Narrative: Patient is a 74 y.o. female with PMHx of COPD with chronic hypoxic respiratory failure on 4 L of oxygen at home, HTN, DM, HLD, prior CVA who presented with shortness of breath and tachypnea-found to have acute on chronic hypoxic respiratory failure secondary to COPD exacerbation and admitted to the hospitalist service.  See below for further details.  Note-Per prior chart review-received second dose of Covid vaccine on 3/1.  Significant Events: 3/15>> admit to [facility]-for acute on chronic hypoxic respiratory failure requiring 15 L of high flow oxygen. 3/16>> weaned to 4 L.  Microbiology data: 3/17>> blood cultures: neg so far 3/15>> blood cultures 1/2-coag negative staph (likely contaminant) 3/12>> COVID-19 positive  COVID-19 medications: 3/15>>3/19 remdesivir  Antibiotics: 3/15>> 3/18: Azithromycin  Procedures: None  Consults: None  Brief Hospital Course: Acute on chronic hypoxic respiratory failure secondary to COPD exacerbation: Much better-anxiety playing a significant part in her complaints-much better after initiation of Klonopin.  Treated  with steroids which will be tapered down on discharge, continue with her usual bronchodilator regimen.  She is back on her usual regimen of 4 L of oxygen at home.  Continue to follow-up with primary care practitioner and primary pulmonologist on discharge.    COVID-19 infection: Has been started on remdesivir given COPD exacerbation-complete remdesivir on 3/19.   COVID-19 Labs:  Recent Labs    06/01/19 0301 06/02/19 0656  DDIMER 0.87* 0.71*  FERRITIN 64 92  CRP 1.0* 0.5    Lab Results  Component Value Date   SARSCOV2NAA POSITIVE (A) 05/27/2019     Coag negative staph bacteremia: Likely a contaminant-repeat blood cultures on 3/17 neg.  No longer on IV vancomycin.    HTN: Controlled-continue amlodipine, hydralazine, losartan.  PCP to optimize further.  HLD: Continue Lipitor and fenofibrate  DM-2: CBGs on the higher side due to steroids-suspect that CBGs will continue to improve as steroid dosage gets tapered down.  Follow with primary care practitioner for further optimization.  Continue Metformin on discharge.  Managed with SSI during this hospitalization.   Discharge Diagnoses:  Principal Problem:   Acute respiratory failure due to COVID-19 [facility]) Active Problems:   Weakness generalized   Diabetes ([place])   Essential hypertension   COPD (chronic obstructive pulmonary disease) [facility])   Discharge Instructions:    Person Under Monitoring Name: Kristin [name]  Location: [place] [address]   Infection Prevention Recommendations for Individuals Confirmed to have, or Being Evaluated for, 2019 Novel Coronavirus (COVID-19) Infection Who Receive Care at Home  Individuals who are  confirmed to have, or are being evaluated for, COVID-19 should follow the prevention steps below until a healthcare provider or local or state health department says they can return to normal activities.  Stay home except to get medical care You should restrict activities  outside your home, except for getting medical care. Do not go to work, school, or public areas, and do not use public transportation or taxis.  Call ahead before visiting your doctor Before your medical appointment, call the healthcare provider and tell them that you have, or are being evaluated for, COVID-19 infection. This will help the healthcare provider's office take steps to keep other people from getting infected. Ask your healthcare provider to call the local or state health department.  Monitor your symptoms Seek prompt medical attention if your illness is worsening (e.g., difficulty breathing). Before going to your medical appointment, call the healthcare provider and tell them that you have, or are being evaluated for, COVID-19 infection. Ask your healthcare provider to call the local or state health department.  Wear a facemask You should wear a facemask that covers your nose and mouth when you are in the same room with other people and when you visit a healthcare provider. People who live with or visit you should also wear a facemask while they are in the same room with you.  Separate yourself from other people in your home As much as possible, you should stay in a different room from other people in your home. Also, you should use a separate bathroom, if available.  Avoid sharing household items You should not share dishes, drinking glasses, cups, eating utensils, towels, bedding, or other items with other people in your home. After using these items, you should wash them thoroughly with soap and water.  Cover your coughs and sneezes Cover your mouth and nose with a tissue when you cough or sneeze, or you can cough or sneeze into your sleeve. Throw used tissues in a lined trash can, and immediately wash your hands with soap and water for at least 20 seconds or use an alcohol-based hand rub.  Wash your [facility] your hands often and thoroughly with soap and water for at  least 20 seconds. You can use an alcohol-based hand sanitizer if soap and water are not available and if your hands are not visibly dirty. Avoid touching your eyes, nose, and mouth with unwashed hands.   Prevention Steps for Caregivers and Household Members of Individuals Confirmed to have, or Being Evaluated for, COVID-19 Infection Being Cared for in the Home  If you live with, or provide care at home for, a person confirmed to have, or being evaluated for, COVID-19 infection please follow these guidelines to prevent infection:  Follow healthcare provider's instructions Make sure that you understand and can help the patient follow any healthcare provider instructions for all care.  Provide for the patient's basic needs You should help the patient with basic needs in the home and provide support for getting groceries, prescriptions, and other personal needs.  Monitor the patient's symptoms If they are getting sicker, call his or her medical provider and tell them that the patient has, or is being evaluated for, COVID-19 infection. This will help the healthcare provider's office take steps to keep other people from getting infected. Ask the healthcare provider to call the local or state health department.  Limit the number of people who have contact with the patient  If possible, have only one caregiver for the patient.  Other household members should stay in another home or place of residence. If this is not possible, they should stay  in another room, or be separated from the patient as much as possible. Use a separate bathroom, if available.  Restrict visitors who do not have an essential need to be in the home.  Keep older adults, very young children, and other sick people away from the patient Keep older adults, very young children, and those who have compromised immune systems or chronic health conditions away from the patient. This includes people with chronic heart, lung, or  kidney conditions, diabetes, and cancer.  Ensure good ventilation Make sure that shared spaces in the home have good air flow, such as from an air conditioner or an opened window, weather permitting.  Wash your hands often  Wash your hands often and thoroughly with soap and water for at least 20 seconds. You can use an alcohol based hand sanitizer if soap and water are not available and if your hands are not visibly dirty.  Avoid touching your eyes, nose, and mouth with unwashed hands.  Use disposable paper towels to dry your hands. If not available, use dedicated cloth towels and replace them when they become wet.  Wear a facemask and gloves  Wear a disposable facemask at all times in the room and gloves when you touch or have contact with the patient's blood, body fluids, and/or secretions or excretions, such as sweat, saliva, sputum, nasal mucus, vomit, urine, or feces.  Ensure the mask fits over your nose and mouth tightly, and do not touch it during use.  Throw out disposable facemasks and gloves after using them. Do not reuse.  Wash your hands immediately after removing your facemask and gloves.  If your personal clothing becomes contaminated, carefully remove clothing and launder. Wash your hands after handling contaminated clothing.  Place all used disposable facemasks, gloves, and other waste in a lined container before disposing them with other household waste.  Remove gloves and wash your hands immediately after handling these items.  Do not share dishes, glasses, or other household items with the patient  Avoid sharing household items. You should not share dishes, drinking glasses, cups, eating utensils, towels, bedding, or other items with a patient who is confirmed to have, or being evaluated for, COVID-19 infection.  After the person uses these items, you should wash them thoroughly with soap and water.  Wash laundry thoroughly  Immediately remove and wash clothes  or bedding that have blood, body fluids, and/or secretions or excretions, such as sweat, saliva, sputum, nasal mucus, vomit, urine, or feces, on them.  Wear gloves when handling laundry from the patient.  Read and follow directions on labels of laundry or clothing items and detergent. In general, wash and dry with the warmest temperatures recommended on the label.  Clean all areas the individual has used often  Clean all touchable surfaces, such as counters, tabletops, doorknobs, bathroom fixtures, toilets, phones, keyboards, tablets, and bedside tables, every day. Also, clean any surfaces that may have blood, body fluids, and/or secretions or excretions on them.  Wear gloves when cleaning surfaces the patient has come in contact with.  Use a diluted bleach solution (e.g., dilute bleach with 1 part bleach and 10 parts water) or a household disinfectant with a label that says EPA-registered for coronaviruses. To make a bleach solution at home, add 1 tablespoon of bleach to 1 quart (4 cups) of water. For a larger supply, add  cup of  bleach to 1 gallon (16 cups) of water.  Read labels of cleaning products and follow recommendations provided on product labels. Labels contain instructions for safe and effective use of the cleaning product including precautions you should take when applying the product, such as wearing gloves or eye protection and making sure you have good ventilation during use of the product.  Remove gloves and wash hands immediately after cleaning.  Monitor yourself for signs and symptoms of illness Caregivers and household members are considered close contacts, should monitor their health, and will be asked to limit movement outside of the home to the extent possible. Follow the monitoring steps for close contacts listed on the symptom monitoring form.   ? If you have additional questions, contact your local health department or call the epidemiologist on call at [phone]  (available 24/7). ? This guidance is subject to change. For the most up-to-date guidance from CDC, please refer to their website: YouBlogs.pl    Activity:  As tolerated with Full fall precautions use walker/cane & assistance as needed   Discharge Instructions    Diet - low sodium heart healthy   Complete by: As directed    Diet Carb Modified   Complete by: As directed    Discharge instructions   Complete by: As directed    Follow with Primary MD  Glendon Axe, MD in 1-2 weeks  Please get a complete blood count and chemistry panel checked by your Primary MD at your next visit, and again as instructed by your Primary MD.  Get Medicines reviewed and adjusted: Please take all your medications with you for your next visit with your Primary MD  Laboratory/radiological data: Please request your Primary MD to go over all hospital tests and procedure/radiological results at the follow up, please ask your Primary MD to get all Hospital records sent to his/her office.  In some cases, they will be blood work, cultures and biopsy results pending at the time of your discharge. Please request that your primary care M.D. follows up on these results.  Also Note the following: If you experience worsening of your admission symptoms, develop shortness of breath, life threatening emergency, suicidal or homicidal thoughts you must seek medical attention immediately by calling 911 or calling your MD immediately  if symptoms less severe.  You must read complete instructions/literature along with all the possible adverse reactions/side effects for all the Medicines you take and that have been prescribed to you. Take any new Medicines after you have completely understood and accpet all the possible adverse reactions/side effects.   Do not drive when taking Pain medications or sleeping medications (Benzodaizepines)  Do not take more than  prescribed Pain, Sleep and Anxiety Medications. It is not advisable to combine anxiety,sleep and pain medications without talking with your primary care practitioner  Special Instructions: If you have smoked or chewed Tobacco  in the last 2 yrs please stop smoking, stop any regular Alcohol  and or any Recreational drug use.  Wear Seat belts while driving.  Please note: You were cared for by a hospitalist during your hospital stay. Once you are discharged, your primary care physician will handle any further medical issues. Please note that NO REFILLS for any discharge medications will be authorized once you are discharged, as it is imperative that you return to your primary care physician (or establish a relationship with a primary care physician if you do not have one) for your post hospital discharge needs so that they can reassess your  need for medications and monitor your lab values.   1.)3 weeks of isolation from 05/27/19   Increase activity slowly   Complete by: As directed      Allergies as of 06/03/2019      Reactions   Codeine Nausea Only   Morphine And Related Nausea Only   Prednisone Other (See Comments)   Elevated BGL into the 300's      Medication List    STOP taking these medications   amoxicillin 875 MG tablet Commonly known as: AMOXIL   cephALEXin 500 MG capsule Commonly known as: KEFLEX   feeding supplement (GLUCERNA SHAKE) Liqd   Fish Oil 1000 MG Caps     TAKE these medications   acetaminophen 325 MG tablet Commonly known as: TYLENOL Take 2 tablets (650 mg total) by mouth every 6 (six) hours as needed for mild pain (or Fever >/= 101).   amLODipine 10 MG tablet Commonly known as: NORVASC Take 1 tablet (10 mg total) by mouth daily. Start taking on: June 04, 2019   aspirin 81 MG EC tablet Take 1 tablet (81 mg total) by mouth daily. What changed: how much to take   atorvastatin 40 MG tablet Commonly known as: LIPITOR Take 40 mg by mouth daily.    bisacodyl 10 MG suppository Commonly known as: DULCOLAX Place 10 mg rectally as needed for mild constipation or moderate constipation.   bisacodyl 10 MG/30ML Enem Commonly known as: FLEET Place 10 mg rectally as needed (for constipation).   clonazePAM 1 MG tablet Commonly known as: KLONOPIN Take 1 mg by mouth in the morning.   CRANBERRY EXTRACT PO Take 15,000 mg by mouth every morning.   dimenhyDRINATE 50 MG tablet Commonly known as: DRAMAMINE Take 50 mg by mouth every 8 (eight) hours as needed for nausea.   famotidine 20 MG tablet Commonly known as: PEPCID Take 1 tablet (20 mg total) by mouth daily. Start taking on: June 04, 2019   fenofibrate 160 MG tablet Take 160 mg by mouth daily.   fexofenadine 180 MG tablet Commonly known as: ALLEGRA Take 180 mg by mouth daily.   fluticasone 50 MCG/ACT nasal spray Commonly known as: FLONASE Place 1-2 sprays into both nostrils in the morning.   fluticasone-salmeterol 230-21 MCG/ACT inhaler Commonly known as: ADVAIR HFA Inhale 2 puffs into the lungs 2 (two) times daily. What changed: Another medication with the same name was removed. Continue taking this medication, and follow the directions you see here.   guaiFENesin-dextromethorphan 100-10 MG/5ML syrup Commonly known as: ROBITUSSIN DM Take 10 mLs by mouth every 4 (four) hours as needed for cough.   hydrALAZINE 50 MG tablet Commonly known as: APRESOLINE Take 1 tablet (50 mg total) by mouth 2 (two) times daily.   ipratropium-albuterol 0.5-2.5 (3) MG/3ML Soln Commonly known as: DUONEB Take 3 mLs by nebulization every 4 (four) hours as needed.   levothyroxine 112 MCG tablet Commonly known as: SYNTHROID Take 112 mcg by mouth daily before breakfast.   losartan 100 MG tablet Commonly known as: COZAAR Take 100 mg by mouth daily.   Magnesium 250 MG Tabs Take 250 mg by mouth daily.   metFORMIN 500 MG tablet Commonly known as: GLUCOPHAGE Take 500 mg by mouth See admin  instructions. Take 500 mg by mouth in the morning with breakfast and 500 mg at 3:30 PM daily   metoCLOPramide 5 MG tablet Commonly known as: Reglan Take 1 tablet (5 mg total) by mouth 4 (four) times daily -  before  meals and at bedtime.   montelukast 10 MG tablet Commonly known as: SINGULAIR Take 10 mg by mouth at bedtime.   omega-3 acid ethyl esters 1 g capsule Commonly known as: LOVAZA Take 1 g by mouth daily with breakfast.   OXYGEN Inhale 4 L/min into the lungs continuous.   pantoprazole 40 MG tablet Commonly known as: PROTONIX Take 1 tablet by mouth 2 (two) times daily with breakfast and lunch.   polyethylene glycol powder 17 GM/SCOOP powder Commonly known as: GLYCOLAX/MIRALAX Take 17 g by mouth daily as needed (for constipation- mixed into 4-8 ounces of desired beverage).   Potassium 99 MG Tabs Take 198 mg by mouth daily with breakfast.   pramipexole 0.5 MG tablet Commonly known as: MIRAPEX Take 1 mg by mouth at bedtime.   predniSONE 10 MG tablet Commonly known as: DELTASONE Take 40 mg daily for 1 day, 30 mg daily for 1 day, 20 mg daily for 1 days,10 mg daily for 1 day, then stop What changed:   medication strength  how much to take  how to take this  when to take this  additional instructions   promethazine 25 MG tablet Commonly known as: PHENERGAN Take 25 mg by mouth every 6 (six) hours as needed for nausea.   Spiriva HandiHaler 18 MCG inhalation capsule Generic drug: tiotropium Place 18 mcg into inhaler and inhale daily.   traZODone 150 MG tablet Commonly known as: DESYREL Take 150 mg by mouth at bedtime.   Ventolin HFA 108 (90 Base) MCG/ACT inhaler Generic drug: albuterol Inhale 2 puffs into the lungs every 3 (three) hours as needed for wheezing or shortness of breath.   albuterol (2.5 MG/3ML) 0.083% nebulizer solution Commonly known as: PROVENTIL Inhale 2.5 mg into the lungs every 6 (six) hours as needed for wheezing or shortness of breath.    Vitamin B-12 3000 MCG Subl Place 3,000 mcg under the tongue daily with breakfast.      Follow-up Information    Glendon Axe, MD. Schedule an appointment as soon as possible for a visit in 1 week(s).   Specialty: Internal Medicine Contact information: [place] [address] [phone]          Allergies  Allergen Reactions  . Codeine Nausea Only  . Morphine And Related Nausea Only  . Prednisone Other (See Comments)    Elevated BGL into the 300's     Other Procedures/Studies: DG Chest Port 1 View  Result Date: 05/30/2019 CLINICAL DATA:  74 year old female with respiratory distress. Positive COVID-19. EXAM: PORTABLE CHEST 1 VIEW COMPARISON:  Chest radiograph dated 05/27/2019. FINDINGS: Background of emphysema. No focal consolidation, pleural effusion, pneumothorax. The cardiac silhouette is within normal limits. Atherosclerotic calcification of the aorta. No acute osseous pathology. Cervical ACDF. IMPRESSION: 1. No acute cardiopulmonary process. 2. Emphysema. Electronically Signed   By: Anner Crete M.D.   On: 05/30/2019 21:04   DG Chest Port 1 View  Result Date: 05/27/2019 CLINICAL DATA:  Difficulty breathing EXAM: PORTABLE CHEST 1 VIEW COMPARISON:  09/02/2017 FINDINGS: Cardiac shadows within normal limits. Aortic calcifications are noted. Lungs are well aerated bilaterally. Mild scarring is noted in the bases bilaterally. No bony abnormality is seen. IMPRESSION: Chronic changes without acute abnormality. Electronically Signed   By: Inez Catalina M.D.   On: 05/27/2019 19:37     TODAY-DAY OF DISCHARGE:  Subjective:   Kristin [name] today has no headache,no chest abdominal pain,no new weakness tingling or numbness, feels much better  wants to go home today.   Objective:   Blood pressure (!) 145/58, pulse 69, temperature 97.8 F (36.6 C), temperature source Oral, resp. rate (!) 21, height 5\' 4"  (1.626 m), weight 57.4 kg, SpO2  97 %. No intake or output data in the 24 hours ending 06/03/19 1130 Filed Weights   05/31/19 0054  Weight: 57.4 kg    Exam: Awake Alert, Oriented *3, No new F.N deficits, Normal affect [place].AT,PERRAL Supple Neck,No JVD, No cervical lymphadenopathy appriciated.  Symmetrical Chest wall movement, Good air movement bilaterally, CTAB RRR,No Gallops,Rubs or new Murmurs, No Parasternal Heave +ve B.Sounds, Abd Soft, Non tender, No organomegaly appriciated, No rebound -guarding or rigidity. No Cyanosis, Clubbing or edema, No new Rash or bruise   PERTINENT RADIOLOGIC STUDIES: DG Chest Port 1 View  Result Date: 05/30/2019 CLINICAL DATA:  74 year old female with respiratory distress. Positive COVID-19. EXAM: PORTABLE CHEST 1 VIEW COMPARISON:  Chest radiograph dated 05/27/2019. FINDINGS: Background of emphysema. No focal consolidation, pleural effusion, pneumothorax. The cardiac silhouette is within normal limits. Atherosclerotic calcification of the aorta. No acute osseous pathology. Cervical ACDF. IMPRESSION: 1. No acute cardiopulmonary process. 2. Emphysema. Electronically Signed   By: Anner Crete M.D.   On: 05/30/2019 21:04   DG Chest Port 1 View  Result Date: 05/27/2019 CLINICAL DATA:  Difficulty breathing EXAM: PORTABLE CHEST 1 VIEW COMPARISON:  09/02/2017 FINDINGS: Cardiac shadows within normal limits. Aortic calcifications are noted. Lungs are well aerated bilaterally. Mild scarring is noted in the bases bilaterally. No bony abnormality is seen. IMPRESSION: Chronic changes without acute abnormality. Electronically Signed   By: Inez Catalina M.D.   On: 05/27/2019 19:37     PERTINENT LAB RESULTS: CBC: Recent Labs    06/02/19 0656 06/03/19 0341  WBC 7.5 9.1  HGB 11.8* 11.8*  HCT 37.3 37.7  PLT 244 282   CMET CMP     Component Value Date/Time   NA 139 06/03/2019 0341   NA 142 06/07/2013 0403   K 4.2 06/03/2019 0341   K 4.1 06/07/2013 0403   CL 104 06/03/2019 0341   CL 109 (H)  06/07/2013 0403   CO2 24 06/03/2019 0341   CO2 27 06/07/2013 0403   GLUCOSE 273 (H) 06/03/2019 0341   GLUCOSE 212 (H) 06/07/2013 0403   BUN 22 06/03/2019 0341   BUN 21 (H) 06/07/2013 0403   CREATININE 0.97 06/03/2019 0341   CREATININE 0.94 06/07/2013 0403   CALCIUM 8.8 (L) 06/03/2019 0341   CALCIUM 8.3 (L) 06/07/2013 0403   PROT 5.9 (L) 06/03/2019 0341   PROT 7.3 06/06/2013 2004   ALBUMIN 3.6 06/03/2019 0341   ALBUMIN 4.6 06/06/2013 2004   AST 16 06/03/2019 0341   AST 13 (L) 06/06/2013 2004   ALT 19 06/03/2019 0341   ALT 15 06/06/2013 2004   ALKPHOS 38 06/03/2019 0341   ALKPHOS 43 (L) 06/06/2013 2004   BILITOT 0.7 06/03/2019 0341   BILITOT 0.3 06/06/2013 2004   GFRNONAA 58 (L) 06/03/2019 0341   GFRNONAA >60 06/07/2013 0403   GFRAA >60 06/03/2019 0341   GFRAA >60 06/07/2013 0403    GFR Estimated Creatinine Clearance: 44.6 mL/min (by C-G formula based on SCr of 0.97 mg/dL). No results for input(s): LIPASE, AMYLASE in the last 72 hours. No results for input(s): CKTOTAL, CKMB, CKMBINDEX, TROPONINI in the last 72 hours. Invalid input(s): POCBNP Recent Labs    06/01/19 0301 06/02/19 0656  DDIMER 0.87* 0.71*   No results for input(s): HGBA1C in the last 72 hours.  No results for input(s): CHOL, HDL, LDLCALC, TRIG, CHOLHDL, LDLDIRECT in the last 72 hours. No results for input(s): TSH, T4TOTAL, T3FREE, THYROIDAB in the last 72 hours.  Invalid input(s): FREET3 Recent Labs    06/01/19 0301 06/02/19 0656  FERRITIN 64 92   Coags: No results for input(s): INR in the last 72 hours.  Invalid input(s): PT Microbiology: Recent Results (from the past 240 hour(s))  Respiratory Panel by RT PCR (Flu A&B, Covid) - Nasopharyngeal Swab     Status: Abnormal   Collection Time: 05/27/19  7:22 PM   Specimen: Nasopharyngeal Swab  Result Value Ref Range Status   SARS Coronavirus 2 by RT PCR POSITIVE (A) NEGATIVE Final    Comment: RESULT CALLED TO, READ BACK BY AND VERIFIED WITH: RIANA  CHAPMAN 05/27/19 AT 2034 HS    Influenza A by PCR NEGATIVE NEGATIVE Final   Influenza B by PCR NEGATIVE NEGATIVE Final    Comment: (NOTE) The Xpert Xpress SARS-CoV-2/FLU/RSV assay is intended as an aid in  the diagnosis of influenza from Nasopharyngeal swab specimens and  should not be used as a sole basis for treatment. Nasal washings and  aspirates are unacceptable for Xpert Xpress SARS-CoV-2/FLU/RSV  testing. Fact Sheet for Patients: PinkCheek.be Fact Sheet for Healthcare Providers: GravelBags.it This test is not yet approved or cleared by the [place] FDA and  has been authorized for detection and/or diagnosis of SARS-CoV-2 by  FDA under an Emergency Use Authorization (EUA). This EUA will remain  in effect (meaning this test can be used) for the duration of the  Covid-19 declaration under Section 564(b)(1) of the Act, 21  U.S.C. section 360bbb-3(b)(1), unless the authorization is  terminated or revoked. Performed at [facility], [address] [place], [place] [address]   Blood Culture (routine x 2)     Status: Abnormal   Collection Time: 05/30/19  9:00 PM   Specimen: BLOOD LEFT ARM  Result Value Ref Range Status   Specimen Description BLOOD LEFT ARM  Final   Special Requests   Final    BOTTLES DRAWN AEROBIC AND ANAEROBIC Blood Culture adequate volume   Culture  Setup Time   Final    ANAEROBIC BOTTLE ONLY GRAM POSITIVE COCCI IN CLUSTERS CRITICAL RESULT CALLED TO, READ BACK BY AND VERIFIED WITH: K PIERCE PHARMD 05/31/19 1807 JDW    Culture (A)  Final    STAPHYLOCOCCUS SPECIES (COAGULASE NEGATIVE) THE SIGNIFICANCE OF ISOLATING THIS ORGANISM FROM A SINGLE SET OF BLOOD CULTURES WHEN MULTIPLE SETS ARE DRAWN IS UNCERTAIN. PLEASE NOTIFY THE MICROBIOLOGY DEPARTMENT WITHIN ONE WEEK IF SPECIATION AND SENSITIVITIES ARE REQUIRED. Performed at [facility], [place] [address] [place], [place] [address]    Report  Status 06/02/2019 FINAL  Final  Blood Culture (routine x 2)     Status: None (Preliminary result)   Collection Time: 05/30/19  9:00 PM   Specimen: BLOOD RIGHT ARM  Result Value Ref Range Status   Specimen Description BLOOD RIGHT ARM  Final   Special Requests   Final    BOTTLES DRAWN AEROBIC AND ANAEROBIC Blood Culture adequate volume   Culture   Final    NO GROWTH 4 DAYS Performed at [facility], [place] [address], [place], [place] [address]    Report Status PENDING  Incomplete  MRSA PCR Screening     Status: None   Collection Time: 05/31/19  1:28 AM   Specimen: Nasal Mucosa; Nasopharyngeal  Result Value Ref Range Status  MRSA by PCR NEGATIVE NEGATIVE Final    Comment:        The GeneXpert MRSA Assay (FDA approved for NASAL specimens only), is one component of a comprehensive MRSA colonization surveillance program. It is not intended to diagnose MRSA infection nor to guide or monitor treatment for MRSA infections. Performed at [facility], [place] [address], [place], [place] [address]   Culture, blood (routine x 2)     Status: None (Preliminary result)   Collection Time: 06/01/19 11:05 AM   Specimen: BLOOD RIGHT HAND  Result Value Ref Range Status   Specimen Description BLOOD RIGHT HAND  Final   Special Requests   Final    BOTTLES DRAWN AEROBIC AND ANAEROBIC Blood Culture adequate volume   Culture   Final    NO GROWTH 2 DAYS Performed at [facility], [place] [address], [place], [place] [address]    Report Status PENDING  Incomplete  Culture, blood (routine x 2)     Status: None (Preliminary result)   Collection Time: 06/01/19 11:13 AM   Specimen: BLOOD LEFT FOREARM  Result Value Ref Range Status   Specimen Description BLOOD LEFT FOREARM  Final   Special Requests   Final    BOTTLES DRAWN AEROBIC AND ANAEROBIC Blood Culture adequate volume   Culture   Final    NO GROWTH 2 DAYS Performed at [facility], [place] [address] [place], [place] [address]     Report Status PENDING  Incomplete    FURTHER DISCHARGE INSTRUCTIONS:  Get Medicines reviewed and adjusted: Please take all your medications with you for your next visit with your Primary MD  Laboratory/radiological data: Please request your Primary MD to go over all hospital tests and procedure/radiological results at the follow up, please ask your Primary MD to get all Hospital records sent to his/her office.  In some cases, they will be blood work, cultures and biopsy results pending at the time of your discharge. Please request that your primary care M.D. goes through all the records of your hospital data and follows up on these results.  Also Note the following: If you experience worsening of your admission symptoms, develop shortness of breath, life threatening emergency, suicidal or homicidal thoughts you must seek medical attention immediately by calling 911 or calling your MD immediately  if symptoms less severe.  You must read complete instructions/literature along with all the possible adverse reactions/side effects for all the Medicines you take and that have been prescribed to you. Take any new Medicines after you have completely understood and accpet all the possible adverse reactions/side effects.   Do not drive when taking Pain medications or sleeping medications (Benzodaizepines)  Do not take more than prescribed Pain, Sleep and Anxiety Medications. It is not advisable to combine anxiety,sleep and pain medications without talking with your primary care practitioner  Special Instructions: If you have smoked or chewed Tobacco  in the last 2 yrs please stop smoking, stop any regular Alcohol  and or any Recreational drug use.  Wear Seat belts while driving.  Please note: You were cared for by a hospitalist during your hospital stay. Once you are discharged, your primary care physician will handle any further medical issues. Please note that NO REFILLS for any discharge  medications will be authorized once you are discharged, as it is imperative that you return to your primary care physician (or establish a relationship with a primary care physician if you do not have one)  for your post hospital discharge needs so that they can reassess your need for medications and monitor your lab values.  Total Time spent coordinating discharge including counseling, education and face to face time equals  45 minutes. Signed: Shanker Ghimire 06/03/2019 11:30 AM

## 2019-06-03 NOTE — Discharge Instructions (Signed)
Person Under Monitoring Name: Kristin [name]  Location: [address] [place]  [address]   Infection Prevention Recommendations for Individuals Confirmed to have, or Being Evaluated for, 2019 Novel Coronavirus (COVID-19) Infection Who Receive Care at Home  Individuals who are confirmed to have, or are being evaluated for, COVID-19 should follow the prevention steps below until a healthcare provider or local or state health department says they can return to normal activities.  Stay home except to get medical care You should restrict activities outside your home, except for getting medical care. Do not go to work, school, or public areas, and do not use public transportation or taxis.  Call ahead before visiting your doctor Before your medical appointment, call the healthcare provider and tell them that you have, or are being evaluated for, COVID-19 infection. This will help the healthcare provider's office take steps to keep other people from getting infected. Ask your healthcare provider to call the local or state health department.  Monitor your symptoms Seek prompt medical attention if your illness is worsening (e.g., difficulty breathing). Before going to your medical appointment, call the healthcare provider and tell them that you have, or are being evaluated for, COVID-19 infection. Ask your healthcare provider to call the local or state health department.  Wear a facemask You should wear a facemask that covers your nose and mouth when you are in the same room with other people and when you visit a healthcare provider. People who live with or visit you should also wear a facemask while they are in the same room with you.  Separate yourself from other people in your home As much as possible, you should stay in a different room from other people in your home. Also, you should use a separate bathroom, if available.  Avoid sharing household items You should not  share dishes, drinking glasses, cups, eating utensils, towels, bedding, or other items with other people in your home. After using these items, you should wash them thoroughly with soap and water.  Cover your coughs and sneezes Cover your mouth and nose with a tissue when you cough or sneeze, or you can cough or sneeze into your sleeve. Throw used tissues in a lined trash can, and immediately wash your hands with soap and water for at least 20 seconds or use an alcohol-based hand rub.  Wash your [facility] your hands often and thoroughly with soap and water for at least 20 seconds. You can use an alcohol-based hand sanitizer if soap and water are not available and if your hands are not visibly dirty. Avoid touching your eyes, nose, and mouth with unwashed hands.   Prevention Steps for Caregivers and Household Members of Individuals Confirmed to have, or Being Evaluated for, COVID-19 Infection Being Cared for in the Home  If you live with, or provide care at home for, a person confirmed to have, or being evaluated for, COVID-19 infection please follow these guidelines to prevent infection:  Follow healthcare provider's instructions Make sure that you understand and can help the patient follow any healthcare provider instructions for all care.  Provide for the patient's basic needs You should help the patient with basic needs in the home and provide support for getting groceries, prescriptions, and other personal needs.  Monitor the patient's symptoms If they are getting sicker, call his or her medical provider and tell them that the patient has, or is being evaluated for, COVID-19 infection. This will help the healthcare provider's  office take steps to keep other people from getting infected. Ask the healthcare provider to call the local or state health department.  Limit the number of people who have contact with the patient  If possible, have only one caregiver for the  patient.  Other household members should stay in another home or place of residence. If this is not possible, they should stay  in another room, or be separated from the patient as much as possible. Use a separate bathroom, if available.  Restrict visitors who do not have an essential need to be in the home.  Keep older adults, very young children, and other sick people away from the patient Keep older adults, very young children, and those who have compromised immune systems or chronic health conditions away from the patient. This includes people with chronic heart, lung, or kidney conditions, diabetes, and cancer.  Ensure good ventilation Make sure that shared spaces in the home have good air flow, such as from an air conditioner or an opened window, weather permitting.  Wash your hands often  Wash your hands often and thoroughly with soap and water for at least 20 seconds. You can use an alcohol based hand sanitizer if soap and water are not available and if your hands are not visibly dirty.  Avoid touching your eyes, nose, and mouth with unwashed hands.  Use disposable paper towels to dry your hands. If not available, use dedicated cloth towels and replace them when they become wet.  Wear a facemask and gloves  Wear a disposable facemask at all times in the room and gloves when you touch or have contact with the patient's blood, body fluids, and/or secretions or excretions, such as sweat, saliva, sputum, nasal mucus, vomit, urine, or feces.  Ensure the mask fits over your nose and mouth tightly, and do not touch it during use.  Throw out disposable facemasks and gloves after using them. Do not reuse.  Wash your hands immediately after removing your facemask and gloves.  If your personal clothing becomes contaminated, carefully remove clothing and launder. Wash your hands after handling contaminated clothing.  Place all used disposable facemasks, gloves, and other waste in a lined  container before disposing them with other household waste.  Remove gloves and wash your hands immediately after handling these items.  Do not share dishes, glasses, or other household items with the patient  Avoid sharing household items. You should not share dishes, drinking glasses, cups, eating utensils, towels, bedding, or other items with a patient who is confirmed to have, or being evaluated for, COVID-19 infection.  After the person uses these items, you should wash them thoroughly with soap and water.  Wash laundry thoroughly  Immediately remove and wash clothes or bedding that have blood, body fluids, and/or secretions or excretions, such as sweat, saliva, sputum, nasal mucus, vomit, urine, or feces, on them.  Wear gloves when handling laundry from the patient.  Read and follow directions on labels of laundry or clothing items and detergent. In general, wash and dry with the warmest temperatures recommended on the label.  Clean all areas the individual has used often  Clean all touchable surfaces, such as counters, tabletops, doorknobs, bathroom fixtures, toilets, phones, keyboards, tablets, and bedside tables, every day. Also, clean any surfaces that may have blood, body fluids, and/or secretions or excretions on them.  Wear gloves when cleaning surfaces the patient has come in contact with.  Use a diluted bleach solution (e.g., dilute bleach with 1  part bleach and 10 parts water) or a household disinfectant with a label that says EPA-registered for coronaviruses. To make a bleach solution at home, add 1 tablespoon of bleach to 1 quart (4 cups) of water. For a larger supply, add  cup of bleach to 1 gallon (16 cups) of water.  Read labels of cleaning products and follow recommendations provided on product labels. Labels contain instructions for safe and effective use of the cleaning product including precautions you should take when applying the product, such as wearing gloves or  eye protection and making sure you have good ventilation during use of the product.  Remove gloves and wash hands immediately after cleaning.  Monitor yourself for signs and symptoms of illness Caregivers and household members are considered close contacts, should monitor their health, and will be asked to limit movement outside of the home to the extent possible. Follow the monitoring steps for close contacts listed on the symptom monitoring form.   ? If you have additional questions, contact your local health department or call the epidemiologist on call at [phone] (available 24/7). ? This guidance is subject to change. For the most up-to-date guidance from [facility], please refer to their website: YouBlogs.pl

## 2019-06-03 NOTE — Progress Notes (Signed)
OT Evaluation  Clinical Impression: PTA, pt lives with husband in one level home, one step to enter. Pt was Independent with ADLs and mobility. Husband completes IADLs. Pt received in bed on 4 L O2 (wears 4 L at baseline) and agreeable to participate in therapy, motivated to return home today. Pt Independent for bed mobility to sit EOB. Min guard for sit to stand trials with and without AD. Pt appeared unsteady without AD, so remainder of evaluation completed with RW. Pt Supervision to doff/don socks sitting EOB with good sitting balance, setup for grooming tasks sitting EOB. During hallway distance mobility, pt desats to 85% briefly on 4 L O2. Pt sustained mainly low 90s during activity on 4 L O2, but did endorse feelings of fatigue and SOB. Recommend HHOT follow up at DC (may benefit from assessment of home setup, as pt reports difficulty transferring into tub). Recommend RW for functional mobility in the home.     06/03/19 0800  OT Visit Information  Last OT Received On 06/03/19  Assistance Needed +1  PT/OT/SLP Co-Evaluation/Treatment Yes  Reason for Co-Treatment Other (comment) (pending DC)  OT goals addressed during session ADL's and self-care  History of Present Illness Patient is a 74 y.o. female with PMHx of COPD with chronic hypoxic respiratory failure on 4 L of oxygen at home, HTN, DM, HLD, prior CVA who presented with shortness of breath and tachypnea-found to have acute on chronic hypoxic respiratory failure secondary to COPD exacerbation and admitted to the hospitalist service  Precautions  Precautions Fall;Other (comment) (airborne)  Restrictions  Weight Bearing Restrictions No  Home Living  Family/patient expects to be discharged to: Private residence  Living Arrangements Spouse/significant other  Available Help at Discharge Family  Type of [place] to enter  Entrance Stairs-Number of Steps 1  Entrance Stairs-Rails None (doorway)  Home Layout One level   Bathroom Biomedical scientist Yes  How Accessible Accessible via [place] - standard  Prior Function  Level of Independence Needs assistance  Gait / Transfers Assistance Needed Independent without AD  ADL's / Comunas with ADLs. Husband completes IADLs, assists into tub   Communication  Communication No difficulties  Pain Assessment  Pain Assessment No/denies pain  Cognition  Arousal/Alertness Awake/alert  Behavior During Therapy WFL for tasks assessed/performed  Overall Cognitive Status Within Functional Limits for tasks assessed  Upper Extremity Assessment  Upper Extremity Assessment Generalized weakness  Lower Extremity Assessment  Lower Extremity Assessment Defer to PT evaluation  ADL  Overall ADL's  Needs assistance/impaired  Eating/Feeding Set up;Sitting  Grooming Set up;Brushing hair;Sitting  Upper Body Bathing Set up;Sitting  Lower Body Bathing Min guard;Supervison/ safety;Sit to/from stand  Upper Body Dressing  Supervision/safety;Sitting  Lower Body Dressing Min guard;Supervision/safety;Sit to/from stand  Lower Body Dressing Details (indicate cue type and reason) Able to doff/don socks independently sitting EOB. Min guard for safety in standing due to intermittent unsteadiness  Toilet Transfer Min guard;Supervision/safety;Stand-pivot;RW  Toileting- Water quality scientist and Hygiene Supervision/safety;Sit to/from stand  Functional mobility during ADLs Min guard;Rolling walker  Bed Mobility  Overal bed mobility Independent  Transfers  Overall transfer level Needs assistance  Equipment used None;Rolling walker (2 wheeled)  Transfers Sit to/from [facility]  Sit to Stand Min guard  Stand pivot transfers Min assist  General transfer comment sit<>stand from EOB without AD with contact guard, sit<>stand from EOB with RW x 2 trials with  contact guard  progressing to supervision, cues for hand placement, stand-step transfer EOB>chair without AD and minA  Balance  Overall balance assessment Needs assistance  Sitting-balance support Feet supported  Sitting balance-Leahy Scale Good  Standing balance support No upper extremity supported  Standing balance-Leahy Scale Fair  Standing balance comment Unsteady without UE support  General Comments  General comments (skin integrity, edema, etc.) Patient on 4L [place], down to 85% with ambulation but not sustained, 92% post ambulation, at rest 94%, HR 93 bpm, BP 150/66 at end of session.  Exercises  Exercises Other exercises  Other Exercises  Other Exercises pursed lip breathing  OT - End of Session  Equipment Utilized During Treatment Gait belt;Rolling walker;Oxygen  Activity Tolerance Patient tolerated treatment well  Patient left in chair;with call bell/phone within reach  Nurse Communication Mobility status;Other (comment) (reports of nausea)  OT Assessment  OT Recommendation/Assessment Patient needs continued OT Services  OT Visit Diagnosis Unsteadiness on feet (R26.81);Other abnormalities of gait and mobility (R26.89);Muscle weakness (generalized) (M62.81)  OT Problem List Decreased strength;Decreased activity tolerance;Impaired balance (sitting and/or standing);Decreased knowledge of use of DME or AE;Cardiopulmonary status limiting activity  OT Plan  OT Frequency (ACUTE ONLY) Min 3X/week  OT Treatment/Interventions (ACUTE ONLY) Self-care/ADL training;Therapeutic exercise;Energy conservation;DME and/or AE instruction;Therapeutic activities;Patient/family education  AM-PAC OT "6 Clicks" Daily Activity Outcome Measure (Version 2)  Help from another person eating meals? 3  Help from another person taking care of personal grooming? 3  Help from another person toileting, which includes using toliet, bedpan, or urinal? 3  Help from another person bathing (including washing, rinsing, drying)? 3  Help  from another person to put on and taking off regular upper body clothing? 3  Help from another person to put on and taking off regular lower body clothing? 3  6 Click Score 18  OT Recommendation  Follow Up Recommendations Home health OT;Supervision/Assistance - 24 hour  OT Equipment Other (comment) (RW )  Individuals Consulted  Consulted and Agree with Results and Recommendations Patient  Acute Rehab OT Goals  Patient Stated Goal go home today  OT Goal Formulation With patient  Time For Goal Achievement 06/17/19  Potential to Achieve Goals Good  OT Time Calculation  OT Start Time (ACUTE ONLY) 0848  OT Stop Time (ACUTE ONLY) 0942  OT Time Calculation (min) 54 min  OT General Charges  $OT Visit 1 Visit  OT Evaluation  $OT Eval Moderate Complexity 1 Mod  OT Treatments  $Self Care/Home Management  8-22 mins  Written Expression  Dominant Hand Right

## 2019-06-03 NOTE — TOC Transition Note (Signed)
Transition of Care [facility]) - CM/SW Discharge Note   Patient Details  Name: Kristin [name] MRN: [id number] Date of Birth: 06-Jan-1946  [facility] CM/SW Contact:  Maryclare Labrador, RN Phone Number: 06/03/2019, 11:56 AM   Clinical Narrative:  PTA independent from home with spouse.  Pt confirms she has 24 hour supervision at home - husband will provide.  Pt still interested in [facility] with [facility] - agency accepts referral however unable to begin [facility] until Monday - pt in agreement.  Pt chose [facility] for RW - [facility] accepts referral.  No other CM needs determined - CM signing off     Final next level of care: Home w Home Health Services Barriers to Discharge: Barriers Resolved   Patient Goals and CMS Choice        Discharge Placement                       Discharge Plan and Services                                     Social Determinants of Health (SDOH) Interventions     Readmission Risk Interventions No flowsheet data found.

## 2019-06-03 NOTE — Progress Notes (Signed)
Physical Therapy Evaluation  Patient presents below her baseline mobility of independent. She is unsteady in standing, improved with use of RW. Recommend she discharge home with RW and home PT, husband there to supervise/assist as needed. Oxygen grossly stable on patient's baseline 4L [place].    06/03/19 0856  PT Visit Information  Last PT Received On 06/03/19  Assistance Needed +1  PT/OT/SLP Co-Evaluation/Treatment Yes (for part of evaluation)  Reason for Co-Treatment Other (comment) (discharge pending )  PT goals addressed during session Mobility/safety with mobility;Balance;Proper use of DME  History of Present Illness 74 year old female admitted 05/30/19 with SOB and tachypnea. Patient with COPD exacerbation and +COVID. She initially required CPAP  and then HFNC --> now weaned down to her baseline 4L [place]. Patient also with anxiety, Klonopin started. PMH: HTN, DM, HLD, prior CVA  Precautions  Precautions Fall;Other (comment)  Precaution Comments 4L oxygen at baseline  Restrictions  Weight Bearing Restrictions No  Home Living  Family/patient expects to be discharged to: Private residence  Living Arrangements Spouse/significant other  Available Help at Discharge Family  Type of [place] to enter  Entrance Stairs-Number of Steps 1  Entrance Stairs-Rails None  Home Layout One level;Other (Comment) (with step down to dining room)  Bathroom Shower/Tub Tub/shower unit  Bathroom Accessibility Yes  Home Equipment Walker - standard;Grab bars - tub/shower  Prior Function  Level of Independence Needs assistance  Gait / Lupus without AD (used standard walker sometimes)  ADL's / Homemaking Assistance Needed I ADLs, husband does IADLs, patient does not drive  Comments Patient ambulates usually without an AD, has her late mother-in-law's standard walker at home to use as needed. Husband assitss her with tub transfer. She uses 4L oxygen at  baseline but sometimes walks around the home without it and puts it back on at rest.   Communication  Communication No difficulties  Pain Assessment  Pain Assessment No/denies pain (No complaints of pain, no signs/symptoms)  Cognition  Arousal/Alertness Awake/alert  Behavior During Therapy Anxious;WFL for tasks assessed/performed  Overall Cognitive Status Within Functional Limits for tasks assessed  Lower Extremity Assessment  Lower Extremity Assessment LLE deficits/detail;RLE deficits/detail  RLE Deficits / Details bilat hip flexion 4-/5, bilat knee extension 4/5  LLE Deficits / Details bilat hip flexion 4-/5, bilat knee extension 4/5  Bed Mobility  Overal bed mobility Independent  General bed mobility comments supine>sit  Transfers  Overall transfer level Needs assistance  Equipment used None;Rolling walker (2 wheeled)  Transfers Sit to/from [employer] Transfers  Sit to Stand Min assist;Min guard;Supervision  Stand pivot transfers Min assist  General transfer comment sit<>stand from EOB without AD with contact guard, sit<>stand from EOB with RW x 2 trials with contact guard progressing to supervision, cues for hand placement, stand-step transfer EOB>chair without AD and minA  Ambulation/Gait  Ambulation/Gait assistance Min guard;Supervision  Gait Distance (Feet) 20 Feet (70)  Assistive device Rolling walker (2 wheeled)  Gait Pattern/deviations Step-through pattern;Decreased step length - left;Decreased step length - right  General Gait Details Cues to relax shoulders with use of RW. Education on use of RW as patient only has standard walker at home. Oxygen grossly stable on 4L [place]. No LOB with use of RW. Unsteady stepping in place without use of RW requiring minA.  Gait velocity decreased  Stairs Yes  Stairs assistance Min guard  Stair Management With walker  Number of Stairs 1  General stair comments Cues for technique for  curb negotiation using RW to access single step  into home and step down into dining room  Balance  Overall balance assessment Needs assistance  Sitting-balance support Feet supported;No upper extremity supported;Bilateral upper extremity supported;Single extremity supported  Sitting balance-Leahy Scale Good  Sitting balance - Comments able to don socks sitting EOB  Standing balance support No upper extremity supported  Standing balance-Leahy Scale Fair  Standing balance comment Unsteady without UE support  General Comments  General comments (skin integrity, edema, etc.) Patient on 4L [place], down to 85% with ambulation but not sustained, 92% post ambulation, at rest 94%, HR 93 bpm, BP 150/66 at end of session.  PT - End of Session  Equipment Utilized During Treatment Gait belt;Oxygen  Activity Tolerance Patient tolerated treatment well;Patient limited by fatigue  Patient left in chair;with call bell/phone within reach  Nurse Communication Mobility status  PT Assessment  PT Recommendation/Assessment Patient needs continued PT services  PT Visit Diagnosis Unsteadiness on feet (R26.81);Other abnormalities of gait and mobility (R26.89)  PT Problem List Decreased strength;Decreased activity tolerance;Decreased balance;Decreased mobility;Decreased knowledge of use of DME;Cardiopulmonary status limiting activity  Barriers to Discharge Comments none anticipated, patient reports her husband will be there with her  PT Plan  PT Frequency (ACUTE ONLY) Min 3X/week  PT Treatment/Interventions (ACUTE ONLY) DME instruction;Gait training;Stair training;Functional mobility training;Therapeutic activities;Therapeutic exercise;Balance training;Patient/family education  AM-PAC PT "6 Clicks" Mobility Outcome Measure (Version 2)  Help needed turning from your back to your side while in a flat bed without using bedrails? 4  Help needed moving from lying on your back to sitting on the side of a flat bed without using bedrails? 4  Help needed moving to and from a  bed to a chair (including a wheelchair)? 3  Help needed standing up from a chair using your arms (e.g., wheelchair or bedside chair)? 3  Help needed to walk in hospital room? 3  Help needed climbing 3-5 steps with a railing?  3  6 Click Score 20  Consider Recommendation of Discharge To: Home with no services  PT Recommendation  Follow Up Recommendations Home health PT;Supervision for mobility/OOB  PT equipment Rolling walker with 5" wheels  Individuals Consulted  Consulted and Agree with Results and Recommendations Patient  Acute Rehab PT Goals  Patient Stated Goal to go home  PT Goal Formulation With patient  Potential to Achieve Goals Good  PT Time Calculation  PT Start Time (ACUTE ONLY) 0856  PT Stop Time (ACUTE ONLY) 0946  PT Time Calculation (min) (ACUTE ONLY) 50 min  PT General Charges  $$ ACUTE PT VISIT 1 Visit  PT Evaluation  $PT Eval Moderate Complexity 1 Mod  PT Treatments  $Gait Training 8-22 mins   Birdie Hopes, PT, DPT Acute Rehab [phone] office

## 2019-06-03 NOTE — Progress Notes (Signed)
Inpatient Diabetes Program Recommendations  AACE/ADA: New Consensus Statement on Inpatient Glycemic Control (2015)  Target Ranges:  Prepandial:   less than 140 mg/dL      Peak postprandial:   less than 180 mg/dL (1-2 hours)      Critically ill patients:  140 - 180 mg/dL   Lab Results  Component Value Date   GLUCAP 225 (H) 06/03/2019   HGBA1C 5.4 10/23/2015   Results for CHANDLAR, ECKELBERRY (MRN [id number]) as of 06/03/2019 10:57  Ref. Range 06/02/2019 07:59 06/02/2019 11:58 06/02/2019 17:21 06/02/2019 22:28 06/03/2019 08:14  Glucose-Capillary Latest Ref Range: 70 - 99 mg/dL 214 (H) 221 (H) 213 (H) 115 (H) 225 (H)   Diabetes history:DM 2 Outpatient Diabetes medications:Metformin 500 mg bid Current orders for Inpatient glycemic control: Novolog 0-20 units tid + hs Tradjenta 5 mg Daily Solumedrol 40 mg Q6 hours  Inpatient Diabetes Program Recommendations:    Please consider adding basal insulin, Levemir 6 units daily.  May benefit from small dose of meal coverage as well 2-3 units Novolog tid with meals if eats at least 50%  Thank you, Reche Dixon, RN, BSN Diabetes Coordinator Inpatient Diabetes Program [phone] (team pager from 8a-5p)

## 2019-06-03 NOTE — Care Management Important Message (Signed)
Important Message  Patient Details  Name: Kristin [name] MRN: [id number] Date of Birth: 1945/08/24   Medicare Important Message Given:  Yes - Important Message mailed due to current National Emergency  Verbal consent obtained due to current National Emergency  Relationship to patient: Self Contact Name: Azusa Bodenheimer Call Date: 06/03/19  Time: 1152 Phone: [phone] Outcome: Spoke with contact Important Message mailed to: Patient address on file    Delorse Lek 06/03/2019, 11:54 AM

## 2019-06-03 NOTE — Progress Notes (Signed)
Kristin [name] to be D/C'd Home per MD order.  Discussed with the patient and all questions fully answered.  VSS, Skin clean, dry and intact without evidence of skin break down, no evidence of skin tears noted. IV catheter discontinued intact. Site without signs and symptoms of complications. Dressing and pressure applied.  An After Visit Summary was printed and given to the patient.   D/c education completed with patient/family including follow up instructions, medication list, d/c activities limitations if indicated, with other d/c instructions as indicated by MD - patient able to verbalize understanding, all questions fully answered.   Patient instructed to return to ED, call 911, or call MD for any changes in condition.   Patient escorted via [place], and D/C home via private auto.  Kristin [name] 06/03/2019 1:33 PM

## 2019-06-04 LAB — CULTURE, BLOOD (ROUTINE X 2): Special Requests: ADEQUATE

## 2019-06-05 LAB — CULTURE, BLOOD (ROUTINE X 2): Special Requests: ADEQUATE

## 2019-06-06 LAB — CULTURE, BLOOD (ROUTINE X 2)
Culture: NO GROWTH
Culture: NO GROWTH
Special Requests: ADEQUATE
Special Requests: ADEQUATE

## 2019-07-19 ENCOUNTER — Emergency Department: Payer: Medicare Other

## 2019-07-19 ENCOUNTER — Observation Stay
Admission: EM | Admit: 2019-07-19 | Discharge: 2019-07-19 | Disposition: A | Discharge status: Home / Self Care | Payer: Medicare Other | Attending: Hospitalist | Admitting: Hospitalist

## 2019-07-19 ENCOUNTER — Other Ambulatory Visit: Payer: Self-pay

## 2019-07-19 DIAGNOSIS — J9611 Chronic respiratory failure with hypoxia: Secondary | ICD-10-CM | POA: Diagnosis not present

## 2019-07-19 DIAGNOSIS — R911 Solitary pulmonary nodule: Secondary | ICD-10-CM | POA: Insufficient documentation

## 2019-07-19 DIAGNOSIS — Z87891 Personal history of nicotine dependence: Secondary | ICD-10-CM | POA: Insufficient documentation

## 2019-07-19 DIAGNOSIS — Z888 Allergy status to other drugs, medicaments and biological substances status: Secondary | ICD-10-CM | POA: Diagnosis not present

## 2019-07-19 DIAGNOSIS — R0602 Shortness of breath: Secondary | ICD-10-CM | POA: Diagnosis present

## 2019-07-19 DIAGNOSIS — Z79899 Other long term (current) drug therapy: Secondary | ICD-10-CM | POA: Diagnosis not present

## 2019-07-19 DIAGNOSIS — Z8616 Personal history of COVID-19: Secondary | ICD-10-CM | POA: Insufficient documentation

## 2019-07-19 DIAGNOSIS — Z7982 Long term (current) use of aspirin: Secondary | ICD-10-CM | POA: Diagnosis not present

## 2019-07-19 DIAGNOSIS — K5730 Diverticulosis of large intestine without perforation or abscess without bleeding: Secondary | ICD-10-CM | POA: Diagnosis not present

## 2019-07-19 DIAGNOSIS — Z7984 Long term (current) use of oral hypoglycemic drugs: Secondary | ICD-10-CM | POA: Diagnosis not present

## 2019-07-19 DIAGNOSIS — R1031 Right lower quadrant pain: Secondary | ICD-10-CM | POA: Diagnosis not present

## 2019-07-19 DIAGNOSIS — Z9981 Dependence on supplemental oxygen: Secondary | ICD-10-CM | POA: Insufficient documentation

## 2019-07-19 DIAGNOSIS — E119 Type 2 diabetes mellitus without complications: Secondary | ICD-10-CM | POA: Insufficient documentation

## 2019-07-19 DIAGNOSIS — Z7951 Long term (current) use of inhaled steroids: Secondary | ICD-10-CM | POA: Diagnosis not present

## 2019-07-19 DIAGNOSIS — I10 Essential (primary) hypertension: Secondary | ICD-10-CM | POA: Diagnosis not present

## 2019-07-19 DIAGNOSIS — Z79890 Hormone replacement therapy (postmenopausal): Secondary | ICD-10-CM | POA: Insufficient documentation

## 2019-07-19 DIAGNOSIS — E039 Hypothyroidism, unspecified: Secondary | ICD-10-CM | POA: Diagnosis not present

## 2019-07-19 DIAGNOSIS — Z885 Allergy status to narcotic agent status: Secondary | ICD-10-CM | POA: Insufficient documentation

## 2019-07-19 DIAGNOSIS — J441 Chronic obstructive pulmonary disease with (acute) exacerbation: Principal | ICD-10-CM | POA: Insufficient documentation

## 2019-07-19 DIAGNOSIS — Z8673 Personal history of transient ischemic attack (TIA), and cerebral infarction without residual deficits: Secondary | ICD-10-CM | POA: Insufficient documentation

## 2019-07-19 DIAGNOSIS — I700 Atherosclerosis of aorta: Secondary | ICD-10-CM | POA: Insufficient documentation

## 2019-07-19 LAB — CBC
HCT: 35.8 % — ABNORMAL LOW (ref 36.0–46.0)
Hemoglobin: 11.4 g/dL — ABNORMAL LOW (ref 12.0–15.0)
MCH: 31.3 pg (ref 26.0–34.0)
MCHC: 31.8 g/dL (ref 30.0–36.0)
MCV: 98.4 fL (ref 80.0–100.0)
Platelets: 206 10*3/uL (ref 150–400)
RBC: 3.64 MIL/uL — ABNORMAL LOW (ref 3.87–5.11)
RDW: 13.4 % (ref 11.5–15.5)
WBC: 5 10*3/uL (ref 4.0–10.5)
nRBC: 0 % (ref 0.0–0.2)

## 2019-07-19 LAB — URINALYSIS, COMPLETE (UACMP) WITH MICROSCOPIC
Bacteria, UA: NONE SEEN
Bilirubin Urine: NEGATIVE
Glucose, UA: >=500 mg/dL — AB
Hgb urine dipstick: NEGATIVE
Ketones, ur: NEGATIVE mg/dL
Leukocytes,Ua: NEGATIVE
Nitrite: NEGATIVE
Protein, ur: NEGATIVE mg/dL
Specific Gravity, Urine: 1.039 — ABNORMAL HIGH (ref 1.005–1.030)
pH: 7 (ref 5.0–8.0)

## 2019-07-19 LAB — COMPREHENSIVE METABOLIC PANEL
ALT: 12 U/L (ref 0–44)
AST: 15 U/L (ref 15–41)
Albumin: 4.6 g/dL (ref 3.5–5.0)
Alkaline Phosphatase: 35 U/L — ABNORMAL LOW (ref 38–126)
Anion gap: 6 (ref 5–15)
BUN: 13 mg/dL (ref 8–23)
CO2: 26 mmol/L (ref 22–32)
Calcium: 9.3 mg/dL (ref 8.9–10.3)
Chloride: 106 mmol/L (ref 98–111)
Creatinine, Ser: 0.78 mg/dL (ref 0.44–1.00)
GFR calc Af Amer: >60 mL/min (ref >60)
GFR calc non Af Amer: >60 mL/min (ref >60)
Glucose, Bld: 236 mg/dL — ABNORMAL HIGH (ref 70–99)
Potassium: 3.8 mmol/L (ref 3.5–5.1)
Sodium: 138 mmol/L (ref 135–145)
Total Bilirubin: 0.7 mg/dL (ref 0.3–1.2)
Total Protein: 6.9 g/dL (ref 6.5–8.1)

## 2019-07-19 LAB — TROPONIN I (HIGH SENSITIVITY)
Troponin I (High Sensitivity): 4 ng/L (ref <18)
Troponin I (High Sensitivity): 4 ng/L (ref <18)

## 2019-07-19 LAB — LIPASE, BLOOD: Lipase: 42 U/L (ref 11–51)

## 2019-07-19 MED ORDER — IPRATROPIUM-ALBUTEROL 0.5-2.5 (3) MG/3ML IN SOLN: 3.0000 mL | Freq: Once | RESPIRATORY_TRACT | Status: DC

## 2019-07-19 MED ORDER — TRAZODONE HCL 50 MG PO TABS: 150.0000 mg | ORAL_TABLET | Freq: Every evening | ORAL | Status: DC | PRN

## 2019-07-19 MED ORDER — AMLODIPINE BESYLATE 10 MG PO TABS: 10.0000 mg | ORAL_TABLET | Freq: Every day | ORAL | Status: DC

## 2019-07-19 MED ORDER — ASPIRIN 81 MG PO TBEC: 81.0000 mg | DELAYED_RELEASE_TABLET | Freq: Every day | ORAL | Status: DC

## 2019-07-19 MED ORDER — HYDRALAZINE HCL 50 MG PO TABS: 25.0000 mg | ORAL_TABLET | Freq: Two times a day (BID) | ORAL | Status: DC

## 2019-07-19 MED ORDER — IPRATROPIUM-ALBUTEROL 0.5-2.5 (3) MG/3ML IN SOLN: 3.0000 mL | Freq: Four times a day (QID) | RESPIRATORY_TRACT | Status: DC

## 2019-07-19 MED ORDER — IPRATROPIUM-ALBUTEROL 0.5-2.5 (3) MG/3ML IN SOLN
3.0000 mL | Freq: Once | RESPIRATORY_TRACT | Status: AC
  Administered 2019-07-19: 12:00:00 3 mL via RESPIRATORY_TRACT
  Filled 2019-07-19: qty 3

## 2019-07-19 MED ORDER — LEVOTHYROXINE SODIUM 112 MCG PO TABS
112.0000 ug | ORAL_TABLET | Freq: Every day | ORAL | Status: DC
  Filled 2019-07-19: qty 1

## 2019-07-19 MED ORDER — METHYLPREDNISOLONE SODIUM SUCC 125 MG IJ SOLR
60.0000 mg | Freq: Once | INTRAMUSCULAR | Status: AC
  Administered 2019-07-19: 60 mg via INTRAVENOUS
  Filled 2019-07-19: qty 2

## 2019-07-19 MED ORDER — IOHEXOL 350 MG/ML SOLN
75.0000 mL | Freq: Once | INTRAVENOUS | Status: AC | PRN
  Administered 2019-07-19: 75 mL via INTRAVENOUS

## 2019-07-19 MED ORDER — FLUTICASONE FUROATE-VILANTEROL 200-25 MCG/INH IN AEPB
1.0000 | INHALATION_SPRAY | Freq: Every day | RESPIRATORY_TRACT | Status: DC
  Filled 2019-07-19: qty 28

## 2019-07-19 MED ORDER — ATORVASTATIN CALCIUM 20 MG PO TABS: 40.0000 mg | ORAL_TABLET | Freq: Every day | ORAL | Status: DC

## 2019-07-19 MED ORDER — LOSARTAN POTASSIUM 50 MG PO TABS: 100.0000 mg | ORAL_TABLET | Freq: Every day | ORAL | Status: DC

## 2019-07-19 MED ORDER — ENOXAPARIN SODIUM 40 MG/0.4ML ~~LOC~~ SOLN: 40.0000 mg | SUBCUTANEOUS | Status: DC

## 2019-07-19 MED ORDER — HYDRALAZINE HCL 25 MG PO TABS: 25.0000 mg | ORAL_TABLET | Freq: Two times a day (BID) | ORAL | Status: DC

## 2019-07-19 MED ORDER — FENOFIBRATE 160 MG PO TABS: 160.0000 mg | ORAL_TABLET | Freq: Every day | ORAL | Status: DC

## 2019-07-19 MED ORDER — IPRATROPIUM-ALBUTEROL 0.5-2.5 (3) MG/3ML IN SOLN
3.0000 mL | Freq: Once | RESPIRATORY_TRACT | Status: AC
  Administered 2019-07-19: 3 mL via RESPIRATORY_TRACT
  Filled 2019-07-19: qty 3

## 2019-07-19 MED ORDER — PANTOPRAZOLE SODIUM 40 MG PO TBEC: 40.0000 mg | DELAYED_RELEASE_TABLET | Freq: Two times a day (BID) | ORAL | Status: DC

## 2019-07-19 NOTE — Progress Notes (Signed)
Kristin [name] A and O x4. VSS. Pt tolerating diet well. No complaints of nausea or vomiting. IV removed intact, prescriptions given. Pt voices understanding of discharge instructions with no further questions. Patient discharged via wheelchair with RN.  Allergies as of 07/19/2019      Reactions   Codeine Nausea Only   Morphine And Related Nausea Only   Prednisone Other (See Comments)   Elevated BGL into the 300's      Medication List    STOP taking these medications   guaiFENesin-dextromethorphan 100-10 MG/5ML syrup Commonly known as: ROBITUSSIN DM   metoCLOPramide 5 MG tablet Commonly known as: Reglan   predniSONE 10 MG tablet Commonly known as: DELTASONE     TAKE these medications   acetaminophen 325 MG tablet Commonly known as: TYLENOL Take 2 tablets (650 mg total) by mouth every 6 (six) hours as needed for mild pain (or Fever >/= 101).   amLODipine 10 MG tablet Commonly known as: NORVASC Take 1 tablet (10 mg total) by mouth daily.   aspirin 81 MG EC tablet Take 1 tablet (81 mg total) by mouth daily. What changed: how much to take   atorvastatin 40 MG tablet Commonly known as: LIPITOR Take 40 mg by mouth daily.   clonazePAM 1 MG tablet Commonly known as: KLONOPIN Take 1 mg by mouth in the morning.   CRANBERRY EXTRACT PO Take 15,000 mg by mouth every morning.   famotidine 20 MG tablet Commonly known as: PEPCID Take 1 tablet (20 mg total) by mouth daily.   fenofibrate 160 MG tablet Take 160 mg by mouth daily.   fexofenadine 180 MG tablet Commonly known as: ALLEGRA Take 180 mg by mouth daily.   fluticasone 50 MCG/ACT nasal spray Commonly known as: FLONASE Place 1-2 sprays into both nostrils in the morning.   fluticasone-salmeterol 230-21 MCG/ACT inhaler Commonly known as: ADVAIR HFA Inhale 2 puffs into the lungs 2 (two) times daily.   hydrALAZINE 50 MG tablet Commonly known as: APRESOLINE Take 0.5 tablets (25 mg total) by mouth 2 (two) times  daily.   ipratropium-albuterol 0.5-2.5 (3) MG/3ML Soln Commonly known as: DUONEB Take 3 mLs by nebulization every 4 (four) hours as needed.   levothyroxine 112 MCG tablet Commonly known as: SYNTHROID Take 112 mcg by mouth daily before breakfast.   losartan 100 MG tablet Commonly known as: COZAAR Take 100 mg by mouth daily.   Magnesium 250 MG Tabs Take 250 mg by mouth daily.   metFORMIN 500 MG tablet Commonly known as: GLUCOPHAGE Take 500 mg by mouth 2 (two) times daily with a meal.   montelukast 10 MG tablet Commonly known as: SINGULAIR Take 10 mg by mouth at bedtime.   OXYGEN Inhale 4 L/min into the lungs continuous.   pantoprazole 40 MG tablet Commonly known as: PROTONIX Take 1 tablet by mouth 2 (two) times daily with breakfast and lunch.   polyethylene glycol powder 17 GM/SCOOP powder Commonly known as: GLYCOLAX/MIRALAX Take 17 g by mouth daily as needed (for constipation- mixed into 4-8 ounces of desired beverage).   Potassium 99 MG Tabs Take 198 mg by mouth daily with breakfast.   pramipexole 0.5 MG tablet Commonly known as: MIRAPEX Take 1-1.5 mg by mouth at bedtime.   Spiriva HandiHaler 18 MCG inhalation capsule Generic drug: tiotropium Place 18 mcg into inhaler and inhale daily.   traZODone 150 MG tablet Commonly known as: DESYREL Take 150 mg by mouth at bedtime.   Ventolin HFA 108 (90 Base) MCG/ACT  inhaler Generic drug: albuterol Inhale 2 puffs into the lungs every 3 (three) hours as needed for wheezing or shortness of breath.   Vitamin B-12 5000 MCG Subl Place 5,000 mcg under the tongue daily with breakfast.       Vitals:   07/19/19 1600 07/19/19 1708  BP: (!) 121/52 (!) 159/61  Pulse: 82 91  Resp: 13 20  Temp:  98.1 F (36.7 C)  SpO2: 100% 99%    Kristin [name]

## 2019-07-19 NOTE — Discharge Summary (Signed)
Physician Discharge Summary   Kristin [name]  female DOB: Oct 04, 1945  [id number]  PCP: Tracie Harrier, MD  Admit date: 07/19/2019 Discharge date: 07/19/2019  Admitted From: home Disposition:  home Husband updated at bedside prior to discharge. CODE STATUS: Full code     Hospital Course:  For full details, please see H&P, progress notes, consult notes and ancillary notes.  Briefly,  Kristin [name] is a 74 y.o. female with medical history significant of COPD on chronic 4L, HTN, DM2, CVA who presented complaining of RLQ abdominal pain.   On presentation, initial vitals: afebrile, pulse 109, BP 184/91, RR 30, sating 100% on 4L.  Labs notable for normal WBC, normal LFT's, normal lipase, and neg trop.  UA neg for infection.  CTA chest and CT ab/pelvis showed no acute finding to explain the abdominal pain.  ED provider noted pt to be wheezing and ordered pt DuoNeb and Solumedrol 60 mg.  ED provider requested pt be admitted for observation due to respiratory concerns.    Soon after admission, pt reported that she presented for abdominal pain, and felt her breathing was at baseline and didn't need to stay in the hospital for respiratory issues if there was no further treatment for her abdominal pain.  Husband arrived, and I discussed with both pt and husband, who both felt ready to be discharged.    # RLQ abdominal pain likely 2/2 soft tissue strain Pain not acute, had been present for at least 2 weeks.  Seemed positional.  CT imaging showed no finding to explain the pain.  Pain most likely due to soft tissue, ligaments, etc.  Supportive care, and outpatient PCP followup.  # Chronic hypoxic respiratory failure on 4L  # COPD, stable Pt was recently discharged on 06/03/19 after being treated for COPD exacerbation.  Currently denied worsening dyspnea or cough.  Continued home bronchodilator regimen.  Hold further steroid.  # HTN continue home BP meds  # Hx of  CVA continue home ASA and statin  # Hypothyroidism continue home Synthroid   Discharge Diagnoses:  Active Problems:   COPD exacerbation [facility])    Discharge Instructions:  Allergies as of 07/19/2019      Reactions   Codeine Nausea Only   Morphine And Related Nausea Only   Prednisone Other (See Comments)   Elevated BGL into the 300's      Medication List    STOP taking these medications   guaiFENesin-dextromethorphan 100-10 MG/5ML syrup Commonly known as: ROBITUSSIN DM   metoCLOPramide 5 MG tablet Commonly known as: Reglan   predniSONE 10 MG tablet Commonly known as: DELTASONE     TAKE these medications   acetaminophen 325 MG tablet Commonly known as: TYLENOL Take 2 tablets (650 mg total) by mouth every 6 (six) hours as needed for mild pain (or Fever >/= 101).   amLODipine 10 MG tablet Commonly known as: NORVASC Take 1 tablet (10 mg total) by mouth daily.   aspirin 81 MG EC tablet Take 1 tablet (81 mg total) by mouth daily. What changed: how much to take   atorvastatin 40 MG tablet Commonly known as: LIPITOR Take 40 mg by mouth daily.   clonazePAM 1 MG tablet Commonly known as: KLONOPIN Take 1 mg by mouth in the morning.   CRANBERRY EXTRACT PO Take 15,000 mg by mouth every morning.   famotidine 20 MG tablet Commonly known as: PEPCID Take 1 tablet (20 mg total) by mouth daily.   fenofibrate 160 MG tablet Take  160 mg by mouth daily.   fexofenadine 180 MG tablet Commonly known as: ALLEGRA Take 180 mg by mouth daily.   fluticasone 50 MCG/ACT nasal spray Commonly known as: FLONASE Place 1-2 sprays into both nostrils in the morning.   fluticasone-salmeterol 230-21 MCG/ACT inhaler Commonly known as: ADVAIR HFA Inhale 2 puffs into the lungs 2 (two) times daily.   hydrALAZINE 50 MG tablet Commonly known as: APRESOLINE Take 0.5 tablets (25 mg total) by mouth 2 (two) times daily.   ipratropium-albuterol 0.5-2.5 (3) MG/3ML Soln Commonly known as:  DUONEB Take 3 mLs by nebulization every 4 (four) hours as needed.   levothyroxine 112 MCG tablet Commonly known as: SYNTHROID Take 112 mcg by mouth daily before breakfast.   losartan 100 MG tablet Commonly known as: COZAAR Take 100 mg by mouth daily.   Magnesium 250 MG Tabs Take 250 mg by mouth daily.   metFORMIN 500 MG tablet Commonly known as: GLUCOPHAGE Take 500 mg by mouth 2 (two) times daily with a meal.   montelukast 10 MG tablet Commonly known as: SINGULAIR Take 10 mg by mouth at bedtime.   OXYGEN Inhale 4 L/min into the lungs continuous.   pantoprazole 40 MG tablet Commonly known as: PROTONIX Take 1 tablet by mouth 2 (two) times daily with breakfast and lunch.   polyethylene glycol powder 17 GM/SCOOP powder Commonly known as: GLYCOLAX/MIRALAX Take 17 g by mouth daily as needed (for constipation- mixed into 4-8 ounces of desired beverage).   Potassium 99 MG Tabs Take 198 mg by mouth daily with breakfast.   pramipexole 0.5 MG tablet Commonly known as: MIRAPEX Take 1-1.5 mg by mouth at bedtime.   Spiriva HandiHaler 18 MCG inhalation capsule Generic drug: tiotropium Place 18 mcg into inhaler and inhale daily.   traZODone 150 MG tablet Commonly known as: DESYREL Take 150 mg by mouth at bedtime.   Ventolin HFA 108 (90 Base) MCG/ACT inhaler Generic drug: albuterol Inhale 2 puffs into the lungs every 3 (three) hours as needed for wheezing or shortness of breath.   Vitamin B-12 5000 MCG Subl Place 5,000 mcg under the tongue daily with breakfast.       Follow-up Information    Tracie Harrier, MD. Schedule an appointment as soon as possible for a visit in 1 week(s).   Specialty: Internal Medicine Contact information: [place] [address] [phone]           Allergies  Allergen Reactions  . Codeine Nausea Only  . Morphine And Related Nausea Only  . Prednisone Other (See Comments)    Elevated BGL  into the 300's     The results of significant diagnostics from this hospitalization (including imaging, microbiology, ancillary and laboratory) are listed below for reference.   Consultations:   Procedures/Studies: DG Chest 2 View  Result Date: 07/19/2019 CLINICAL DATA:  Show shortness of breath and chest pain EXAM: CHEST - 2 VIEW COMPARISON:  May 30, 2019 FINDINGS: There is atelectatic change in the lung bases. The lungs elsewhere are clear. Heart size and pulmonary vascularity are normal. No adenopathy. There is aortic atherosclerosis. There is postoperative change in the lower cervical region. IMPRESSION: Bibasilar atelectasis. Lungs elsewhere clear. Cardiac silhouette. No adenopathy evident. Aortic Atherosclerosis (ICD10-I70.0). Electronically Signed   By: Lowella Grip III M.D.   On: 07/19/2019 11:35   CT Angio Chest PE W and/or Wo Contrast  Result Date: 07/19/2019 CLINICAL DATA:  Shortness of breath, left rib pain, right lower  quadrant abdominal pain EXAM: CT ANGIOGRAPHY CHEST CT ABDOMEN AND PELVIS WITH CONTRAST TECHNIQUE: Multidetector CT imaging of the chest was performed using the standard protocol during bolus administration of intravenous contrast. Multiplanar CT image reconstructions and MIPs were obtained to evaluate the vascular anatomy. Multidetector CT imaging of the abdomen and pelvis was performed using the standard protocol during bolus administration of intravenous contrast. CONTRAST:  81mL OMNIPAQUE IOHEXOL 350 MG/ML SOLN COMPARISON:  09/02/2017, 01/17/2019 FINDINGS: CTA CHEST FINDINGS Cardiovascular: Satisfactory opacification of the pulmonary arteries to the segmental level. No evidence of pulmonary embolism. Normal heart size. No pericardial effusion. Thoracic aorta is nonaneurysmal. Atherosclerotic calcifications of the aorta and coronary arteries. Mediastinum/Nodes: No enlarged mediastinal, hilar, or axillary lymph nodes. Thyroid gland, trachea, and esophagus demonstrate  no significant findings. Lungs/Pleura: Chronic changes of emphysema with mild bronchial wall thickening. Mild bibasilar atelectasis, left greater than right. Stable benign 5 mm nodule within the inferior aspect of the right upper lobe (series 4, image 55). Stable 6 mm ground-glass attenuation nodule in the left upper lobe (series 4, image 45), unchanged from 08/04/2018. 3 mm medial right upper lobe nodule (series 4, image 20), new from prior. No focal airspace consolidation, pleural effusion, or pneumothorax. Musculoskeletal: Partially visualized lower cervical ACDF. No acute osseous findings. Review of the MIP images confirms the above findings. CT ABDOMEN and PELVIS FINDINGS Hepatobiliary: Mildly decreased attenuation of the hepatic parenchyma suggesting hepatic steatosis. No focal hepatic lesion. Prior cholecystectomy. No biliary dilatation. Pancreas: Unremarkable. No pancreatic ductal dilatation or surrounding inflammatory changes. Spleen: Normal in size without focal abnormality. Adrenals/Urinary Tract: Adrenal glands are unremarkable. Kidneys enhance symmetrically, without renal calculi, focal lesion, or hydronephrosis. Urinary bladder wall is mildly thickened. Stomach/Bowel: Stomach is within normal limits. Appendix appears normal. Scattered colonic diverticula. No evidence of bowel wall thickening, distention, or inflammatory changes. Vascular/Lymphatic: Aortoiliac atherosclerosis. No aneurysm. No abdominopelvic lymphadenopathy. Reproductive: Status post hysterectomy. No adnexal masses. Other: No abdominal wall hernia or abnormality. No abdominopelvic ascites. Musculoskeletal: No acute or significant osseous findings. Stable bone island on the right at S1. Review of the MIP images confirms the above findings. IMPRESSION: 1. Negative for pulmonary embolism. 2. Mild urinary bladder wall thickening, which may represent cystitis. Correlation with urinalysis is recommended. 3. Otherwise no acute abdominopelvic  findings.  Normal appendix. 4. New 3 mm right upper lobe pulmonary nodule. No follow-up needed if patient is low-risk. Non-contrast chest CT can be considered in 12 months if patient is high-risk. This recommendation follows the consensus statement: Guidelines for Management of Incidental Pulmonary Nodules Detected on CT Images: From the Fleischner Society 2017; Radiology 2017; 284:228-243. 5. Stable appearance of a left upper lobe 6 mm ground-glass nodule, unchanged from 08/04/2018. Repeat CT is recommended every 2 years until 5 years of stability has been established. This recommendation also follows the above consensus statement. 6. Colonic diverticulosis without evidence for diverticulitis. 7. Aortic Atherosclerosis (ICD10-I70.0) and Emphysema (ICD10-J43.9). Electronically Signed   By: Davina Poke D.O.   On: 07/19/2019 13:03   CT ABDOMEN PELVIS W CONTRAST  Result Date: 07/19/2019 CLINICAL DATA:  Shortness of breath, left rib pain, right lower quadrant abdominal pain EXAM: CT ANGIOGRAPHY CHEST CT ABDOMEN AND PELVIS WITH CONTRAST TECHNIQUE: Multidetector CT imaging of the chest was performed using the standard protocol during bolus administration of intravenous contrast. Multiplanar CT image reconstructions and MIPs were obtained to evaluate the vascular anatomy. Multidetector CT imaging of the abdomen and pelvis was performed using the standard protocol during bolus administration of intravenous  contrast. CONTRAST:  70mL OMNIPAQUE IOHEXOL 350 MG/ML SOLN COMPARISON:  09/02/2017, 01/17/2019 FINDINGS: CTA CHEST FINDINGS Cardiovascular: Satisfactory opacification of the pulmonary arteries to the segmental level. No evidence of pulmonary embolism. Normal heart size. No pericardial effusion. Thoracic aorta is nonaneurysmal. Atherosclerotic calcifications of the aorta and coronary arteries. Mediastinum/Nodes: No enlarged mediastinal, hilar, or axillary lymph nodes. Thyroid gland, trachea, and esophagus  demonstrate no significant findings. Lungs/Pleura: Chronic changes of emphysema with mild bronchial wall thickening. Mild bibasilar atelectasis, left greater than right. Stable benign 5 mm nodule within the inferior aspect of the right upper lobe (series 4, image 55). Stable 6 mm ground-glass attenuation nodule in the left upper lobe (series 4, image 45), unchanged from 08/04/2018. 3 mm medial right upper lobe nodule (series 4, image 20), new from prior. No focal airspace consolidation, pleural effusion, or pneumothorax. Musculoskeletal: Partially visualized lower cervical ACDF. No acute osseous findings. Review of the MIP images confirms the above findings. CT ABDOMEN and PELVIS FINDINGS Hepatobiliary: Mildly decreased attenuation of the hepatic parenchyma suggesting hepatic steatosis. No focal hepatic lesion. Prior cholecystectomy. No biliary dilatation. Pancreas: Unremarkable. No pancreatic ductal dilatation or surrounding inflammatory changes. Spleen: Normal in size without focal abnormality. Adrenals/Urinary Tract: Adrenal glands are unremarkable. Kidneys enhance symmetrically, without renal calculi, focal lesion, or hydronephrosis. Urinary bladder wall is mildly thickened. Stomach/Bowel: Stomach is within normal limits. Appendix appears normal. Scattered colonic diverticula. No evidence of bowel wall thickening, distention, or inflammatory changes. Vascular/Lymphatic: Aortoiliac atherosclerosis. No aneurysm. No abdominopelvic lymphadenopathy. Reproductive: Status post hysterectomy. No adnexal masses. Other: No abdominal wall hernia or abnormality. No abdominopelvic ascites. Musculoskeletal: No acute or significant osseous findings. Stable bone island on the right at S1. Review of the MIP images confirms the above findings. IMPRESSION: 1. Negative for pulmonary embolism. 2. Mild urinary bladder wall thickening, which may represent cystitis. Correlation with urinalysis is recommended. 3. Otherwise no acute  abdominopelvic findings.  Normal appendix. 4. New 3 mm right upper lobe pulmonary nodule. No follow-up needed if patient is low-risk. Non-contrast chest CT can be considered in 12 months if patient is high-risk. This recommendation follows the consensus statement: Guidelines for Management of Incidental Pulmonary Nodules Detected on CT Images: From the Fleischner Society 2017; Radiology 2017; 284:228-243. 5. Stable appearance of a left upper lobe 6 mm ground-glass nodule, unchanged from 08/04/2018. Repeat CT is recommended every 2 years until 5 years of stability has been established. This recommendation also follows the above consensus statement. 6. Colonic diverticulosis without evidence for diverticulitis. 7. Aortic Atherosclerosis (ICD10-I70.0) and Emphysema (ICD10-J43.9). Electronically Signed   By: Davina Poke D.O.   On: 07/19/2019 13:03      Labs: BNP (last 3 results) Recent Labs    05/27/19 1858  BNP [id number]   Basic Metabolic Panel: Recent Labs  Lab 07/19/19 1104  NA 138  K 3.8  CL 106  CO2 26  GLUCOSE 236*  BUN 13  CREATININE 0.78  CALCIUM 9.3   Liver Function Tests: Recent Labs  Lab 07/19/19 1104  AST 15  ALT 12  ALKPHOS 35*  BILITOT 0.7  PROT 6.9  ALBUMIN 4.6   Recent Labs  Lab 07/19/19 1104  LIPASE 42   No results for input(s): AMMONIA in the last 168 hours. CBC: Recent Labs  Lab 07/19/19 1104  WBC 5.0  HGB 11.4*  HCT 35.8*  MCV 98.4  PLT 206   Cardiac Enzymes: No results for input(s): CKTOTAL, CKMB, CKMBINDEX, TROPONINI in the last 168 hours. BNP: Invalid input(s): POCBNP  CBG: No results for input(s): GLUCAP in the last 168 hours. D-Dimer No results for input(s): DDIMER in the last 72 hours. Hgb A1c No results for input(s): HGBA1C in the last 72 hours. Lipid Profile No results for input(s): CHOL, HDL, LDLCALC, TRIG, CHOLHDL, LDLDIRECT in the last 72 hours. Thyroid function studies No results for input(s): TSH, T4TOTAL, T3FREE, THYROIDAB  in the last 72 hours.  Invalid input(s): FREET3 Anemia work up No results for input(s): VITAMINB12, FOLATE, FERRITIN, TIBC, IRON, RETICCTPCT in the last 72 hours. Urinalysis    Component Value Date/Time   COLORURINE STRAW (A) 07/19/2019 1404   APPEARANCEUR CLEAR (A) 07/19/2019 1404   APPEARANCEUR Clear 03/18/2013 1319   LABSPEC 1.039 (H) 07/19/2019 1404   LABSPEC 1.005 03/18/2013 1319   PHURINE 7.0 07/19/2019 1404   GLUCOSEU >=500 (A) 07/19/2019 1404   GLUCOSEU Negative 03/18/2013 1319   HGBUR NEGATIVE 07/19/2019 1404   BILIRUBINUR NEGATIVE 07/19/2019 1404   BILIRUBINUR Negative 03/18/2013 1319   KETONESUR NEGATIVE 07/19/2019 1404   PROTEINUR NEGATIVE 07/19/2019 1404   UROBILINOGEN 1.0 06/24/2008 1545   NITRITE NEGATIVE 07/19/2019 1404   LEUKOCYTESUR NEGATIVE 07/19/2019 1404   LEUKOCYTESUR 1+ 03/18/2013 1319   Sepsis Labs Invalid input(s): PROCALCITONIN,  WBC,  LACTICIDVEN Microbiology No results found for this or any previous visit (from the past 240 hour(s)).   Total time spend on discharging this patient, including the last patient exam, discussing the hospital stay, instructions for ongoing care as it relates to all pertinent caregivers, as well as preparing the medical discharge records, prescriptions, and/or referrals as applicable, is 20 minutes.    Enzo Bi, MD  [facility] 07/19/2019, 5:15 PM  If 7PM-7AM, please contact night-coverage

## 2019-07-19 NOTE — ED Provider Notes (Signed)
[facility] Emergency Department Provider Note    First MD Initiated Contact with Patient 07/19/19 1130     (approximate)  I have reviewed the triage vital signs and the nursing notes.   HISTORY  Chief Complaint Abdominal Pain and Shortness of Breath    HPI Kristin [name] is a 74 y.o. female below listed past medical history on 3 to 4 L nasal cannula chronically presents to the ER for evaluation of 2 days of right-sided abdominal pain associate with some nausea as well as worsening shortness of breath.  Is never had pain quite like this before.  Denies any measured fevers.  Was recently on antibiotics for UTI.  Denies any flank pain.  Denies any chest pain but does have some pressure.  Reported did have Covid earlier in March has had vaccinations.  Feels like her respiratory status is declining over the past few days.  Not coughing up any phlegm.    Past Medical History:  Diagnosis Date  . Asthma   . 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 COPD (chronic obstructive pulmonary disease) ([place])   . Diabetes mellitus without complication ([place])   . Emphysema lung ([place])   . Hypertension   . Stroke [facility])    Family History  Problem Relation Age of Onset  . Ovarian cancer Mother   . Lung cancer Father   . Breast cancer Neg Hx    Past Surgical History:  Procedure Laterality Date  . ABDOMINAL HYSTERECTOMY    . BREAST BIOPSY Left 1990   neg  . CHOLECYSTECTOMY    . COLONOSCOPY WITH PROPOFOL N/A 12/01/2017   Procedure: COLONOSCOPY WITH PROPOFOL;  Surgeon: Toledo, Benay Pike, MD;  Location: [facility] ENDOSCOPY;  Service: Gastroenterology;  Laterality: N/A;  . ESOPHAGOGASTRODUODENOSCOPY N/A 12/01/2017   Procedure: ESOPHAGOGASTRODUODENOSCOPY (EGD);  Surgeon: Toledo, Benay Pike, MD;  Location: [facility] ENDOSCOPY;  Service: Gastroenterology;  Laterality: N/A;  . NECK SURGERY     Patient Active Problem List   Diagnosis Date Noted  . COPD exacerbation ([place]) 07/19/2019    . Acute respiratory failure due to COVID-19 ([place]) 05/30/2019  . Carotid stenosis 10/26/2017  . Diabetes ([place]) 10/26/2017  . Essential hypertension 10/26/2017  . COPD (chronic obstructive pulmonary disease) ([place]) 10/26/2017  . Pyelonephritis 09/02/2017  . UTI (urinary tract infection) 11/30/2016  . Encephalopathy acute 11/30/2016  . Chronic respiratory failure with hypoxia ([place]) 11/30/2016  . Weakness generalized 11/30/2016  . Acute encephalopathy 11/30/2016  . Acute respiratory failure ([place]) 05/23/2016  . Malnutrition of moderate degree 05/23/2016  . Syncope 10/23/2015      Prior to Admission medications   Medication Sig Start Date End Date Taking? Authorizing Provider  acetaminophen (TYLENOL) 325 MG tablet Take 2 tablets (650 mg total) by mouth every 6 (six) hours as needed for mild pain (or Fever >/= 101). 09/04/17   Gouru, Aruna, MD  albuterol (PROVENTIL) (2.5 MG/3ML) 0.083% nebulizer solution Inhale 2.5 mg into the lungs every 6 (six) hours as needed for wheezing or shortness of breath.  12/24/18   [provider]  amLODipine (NORVASC) 10 MG tablet Take 1 tablet (10 mg total) by mouth daily. 06/04/19   Ghimire, Henreitta Leber, MD  aspirin EC 81 MG EC tablet Take 1 tablet (81 mg total) by mouth daily. Patient taking differently: Take 162 mg by mouth daily.  10/24/15   Loletha Grayer, MD  atorvastatin (LIPITOR) 40 MG tablet Take 40 mg by mouth daily.  [provider]  bisacodyl (DULCOLAX) 10 MG suppository Place 10 mg rectally as needed for mild constipation or moderate constipation.    [provider]  bisacodyl (FLEET) 10 MG/30ML ENEM Place 10 mg rectally as needed (for constipation).     [provider]  clonazePAM (KLONOPIN) 1 MG tablet Take 1 mg by mouth in the morning.     [provider]  CRANBERRY EXTRACT PO Take 15,000 mg by mouth every morning.    [provider]  Cyanocobalamin (VITAMIN B-12) 3000 MCG SUBL Place 3,000 mcg  under the tongue daily with breakfast.    [provider]  dimenhyDRINATE (DRAMAMINE) 50 MG tablet Take 50 mg by mouth every 8 (eight) hours as needed for nausea.    [provider]  famotidine (PEPCID) 20 MG tablet Take 1 tablet (20 mg total) by mouth daily. 06/04/19   Ghimire, Henreitta Leber, MD  fenofibrate 160 MG tablet Take 160 mg by mouth daily.  10/06/15   [provider]  fexofenadine (ALLEGRA) 180 MG tablet Take 180 mg by mouth daily.    [provider]  fluticasone (FLONASE) 50 MCG/ACT nasal spray Place 1-2 sprays into both nostrils in the morning.    [provider]  fluticasone-salmeterol (ADVAIR HFA) 230-21 MCG/ACT inhaler Inhale 2 puffs into the lungs 2 (two) times daily.    [provider]  guaiFENesin-dextromethorphan (ROBITUSSIN DM) 100-10 MG/5ML syrup Take 10 mLs by mouth every 4 (four) hours as needed for cough. 06/03/19   Ghimire, Henreitta Leber, MD  hydrALAZINE (APRESOLINE) 50 MG tablet Take 1 tablet (50 mg total) by mouth 2 (two) times daily. 06/03/19   Ghimire, Henreitta Leber, MD  ipratropium-albuterol (DUONEB) 0.5-2.5 (3) MG/3ML SOLN Take 3 mLs by nebulization every 4 (four) hours as needed. Patient not taking: Reported on 05/30/2019 05/26/16   Gladstone Lighter, MD  levothyroxine (SYNTHROID, LEVOTHROID) 112 MCG tablet Take 112 mcg by mouth daily before breakfast.  09/15/15   [provider]  losartan (COZAAR) 100 MG tablet Take 100 mg by mouth daily.    [provider]  Magnesium 250 MG TABS Take 250 mg by mouth daily.    [provider]  metFORMIN (GLUCOPHAGE) 500 MG tablet Take 500 mg by mouth See admin instructions. Take 500 mg by mouth in the morning with breakfast and 500 mg at 3:30 PM daily    [provider]  metoCLOPramide (REGLAN) 5 MG tablet Take 1 tablet (5 mg total) by mouth 4 (four) times daily -  before meals and at bedtime. Patient not taking: Reported on 05/30/2019 09/04/17   Nicholes Mango, MD    montelukast (SINGULAIR) 10 MG tablet Take 10 mg by mouth at bedtime.  06/06/17   [provider]  omega-3 acid ethyl esters (LOVAZA) 1 g capsule Take 1 g by mouth daily with breakfast.    [provider]  OXYGEN Inhale 4 L/min into the lungs continuous.     [provider]  pantoprazole (PROTONIX) 40 MG tablet Take 1 tablet by mouth 2 (two) times daily with breakfast and lunch. 10/20/15   [provider]  polyethylene glycol powder (GLYCOLAX/MIRALAX) 17 GM/SCOOP powder Take 17 g by mouth daily as needed (for constipation- mixed into 4-8 ounces of desired beverage).  10/27/17   [provider]  Potassium 99 MG TABS Take 198 mg by mouth daily with breakfast.    [provider]  pramipexole (MIRAPEX) 0.5 MG tablet Take 1 mg by mouth at bedtime.  [provider]  predniSONE (DELTASONE) 10 MG tablet Take 40 mg daily for 1 day, 30 mg daily for 1 day, 20 mg daily for 1 days,10 mg daily for 1 day, then stop 06/03/19   Ghimire, Henreitta Leber, MD  promethazine (PHENERGAN) 25 MG tablet Take 25 mg by mouth every 6 (six) hours as needed for nausea.  09/14/15   [provider]  SPIRIVA HANDIHALER 18 MCG inhalation capsule Place 18 mcg into inhaler and inhale daily. 05/13/16   [provider]  traZODone (DESYREL) 150 MG tablet Take 150 mg by mouth at bedtime. 08/14/17   [provider]  VENTOLIN HFA 108 (90 Base) MCG/ACT inhaler Inhale 2 puffs into the lungs every 3 (three) hours as needed for wheezing or shortness of breath.  10/11/15   [provider]    Allergies Codeine, Morphine and related, and Prednisone    Social History Social History   Tobacco Use  . Smoking status: Former Research scientist (life sciences)  . Smokeless tobacco: Never Used  Substance Use Topics  . Alcohol use: No  . Drug use: No    Review of Systems Patient denies headaches, rhinorrhea, blurry vision, numbness, shortness of breath, chest pain, edema, cough,  abdominal pain, nausea, vomiting, diarrhea, dysuria, fevers, rashes or hallucinations unless otherwise stated above in HPI. ____________________________________________   PHYSICAL EXAM:  VITAL SIGNS: Vitals:   07/19/19 1415 07/19/19 1430  BP: (!) 141/59 (!) 152/62  Pulse: 83 83  Resp: 19 14  Temp:    SpO2: 100% 98%    Constitutional: Alert and oriented ill appearing .  Eyes: Conjunctivae are normal.  Head: Atraumatic. Nose: No congestion/rhinnorhea. Mouth/Throat: Mucous membranes are moist.   Neck: No stridor. Painless ROM.  Cardiovascular: Normal rate, regular rhythm. Grossly normal heart sounds.  Good peripheral circulation. Respiratory: tachypnea, retractions, speaking in short phrases, diminished BS throughout,  Gastrointestinal: Soft and nontender. No distention. No abdominal bruits. No CVA tenderness. Genitourinary:  Musculoskeletal: No lower extremity tenderness nor edema.  No joint effusions. Neurologic:  Normal speech and language. No gross focal neurologic deficits are appreciated. No facial droop Skin:  Skin is warm, dry and intact. No rash noted. Psychiatric: Mood and affect are normal. Speech and behavior are normal.  ____________________________________________   LABS (all labs ordered are listed, but only abnormal results are displayed)  Results for orders placed or performed during the hospital encounter of 07/19/19 (from the past 24 hour(s))  Lipase, blood     Status: None   Collection Time: 07/19/19 11:04 AM  Result Value Ref Range   Lipase 42 11 - 51 U/L  Comprehensive metabolic panel     Status: Abnormal   Collection Time: 07/19/19 11:04 AM  Result Value Ref Range   Sodium 138 135 - 145 mmol/L   Potassium 3.8 3.5 - 5.1 mmol/L   Chloride 106 98 - 111 mmol/L   CO2 26 22 - 32 mmol/L   Glucose, Bld 236 (H) 70 - 99 mg/dL   BUN 13 8 - 23 mg/dL   Creatinine, Ser 0.78 0.44 - 1.00 mg/dL   Calcium 9.3 8.9 - 10.3 mg/dL   Total Protein 6.9 6.5 - 8.1 g/dL    Albumin 4.6 3.5 - 5.0 g/dL   AST 15 15 - 41 U/L   ALT 12 0 - 44 U/L   Alkaline Phosphatase 35 (L) 38 - 126 U/L   Total Bilirubin 0.7 0.3 - 1.2 mg/dL   GFR calc non Af Amer >60 >60 mL/min  GFR calc Af Amer >60 >60 mL/min   Anion gap 6 5 - 15  CBC     Status: Abnormal   Collection Time: 07/19/19 11:04 AM  Result Value Ref Range   WBC 5.0 4.0 - 10.5 K/uL   RBC 3.64 (L) 3.87 - 5.11 MIL/uL   Hemoglobin 11.4 (L) 12.0 - 15.0 g/dL   HCT 35.8 (L) 36.0 - 46.0 %   MCV 98.4 80.0 - 100.0 fL   MCH 31.3 26.0 - 34.0 pg   MCHC 31.8 30.0 - 36.0 g/dL   RDW 13.4 11.5 - 15.5 %   Platelets 206 150 - 400 K/uL   nRBC 0.0 0.0 - 0.2 %  Troponin I (High Sensitivity)     Status: None   Collection Time: 07/19/19 11:04 AM  Result Value Ref Range   Troponin I (High Sensitivity) 4 <18 ng/L  Urinalysis, Complete w Microscopic     Status: Abnormal   Collection Time: 07/19/19  2:04 PM  Result Value Ref Range   Color, Urine STRAW (A) YELLOW   APPearance CLEAR (A) CLEAR   Specific Gravity, Urine 1.039 (H) 1.005 - 1.030   pH 7.0 5.0 - 8.0   Glucose, UA >=500 (A) NEGATIVE mg/dL   Hgb urine dipstick NEGATIVE NEGATIVE   Bilirubin Urine NEGATIVE NEGATIVE   Ketones, ur NEGATIVE NEGATIVE mg/dL   Protein, ur NEGATIVE NEGATIVE mg/dL   Nitrite NEGATIVE NEGATIVE   Leukocytes,Ua NEGATIVE NEGATIVE   RBC / HPF 0-5 0 - 5 RBC/hpf   WBC, UA 0-5 0 - 5 WBC/hpf   Bacteria, UA NONE SEEN NONE SEEN   Squamous Epithelial / LPF 0-5 0 - 5  Troponin I (High Sensitivity)     Status: None   Collection Time: 07/19/19  2:04 PM  Result Value Ref Range   Troponin I (High Sensitivity) 4 <18 ng/L   ____________________________________________  EKG My review and personal interpretation at Time: 10:58   Indication: sob  Rate: 105  Rhythm: sinus Axis: left Other: nonspecific st abn, no stemi criteria, abn ekg ____________________________________________  RADIOLOGY  I personally reviewed all radiographic images ordered to evaluate  for the above acute complaints and reviewed radiology reports and findings.  These findings were personally discussed with the patient.  Please see medical record for radiology report.  ____________________________________________   PROCEDURES  Procedure(s) performed:  Procedures    Critical Care performed: no ____________________________________________   INITIAL IMPRESSION / ASSESSMENT AND PLAN / ED COURSE  Pertinent labs & imaging results that were available during my care of the patient were reviewed by me and considered in my medical decision making (see chart for details).   DDX: Asthma, copd, CHF, pna, ptx, malignancy, Pe, anemia   Kristin [name] is a 74 y.o. who presents to the ED with symptoms as described above.  Patient is ill-appearing protecting her airway but appears very short of breath speaking short phrases.  Will give nebs and steroids.  Given her abdominal pain order CT imaging as well as CT chest to exclude PE other intrathoracic process.  Will reassess.  Have lower suspicion for CHF or ACS.  The patient will be placed on continuous pulse oximetry and telemetry for monitoring.  Laboratory evaluation will be sent to evaluate for the above complaints.     Clinical Course as of Jul 18 1500  Tue Jul 19, 2019  1444 Repeat abdominal exam is benign.  No signs of infection.  Patient still feeling very short of breath on repeat  exam her wheezing is worsening.  Will give additional nebulizers.  I do feel patient will require observation in the hospital for respiratory treatments and reassessment.   [PR]    Clinical Course User Index [PR] Merlyn Lot, MD    The patient was evaluated in Emergency Department today for the symptoms described in the history of present illness. He/she was evaluated in the context of the global COVID-19 pandemic, which necessitated consideration that the patient might be at risk for infection with the SARS-CoV-2 virus that causes  COVID-19. Institutional protocols and algorithms that pertain to the evaluation of patients at risk for COVID-19 are in a state of rapid change based on information released by regulatory bodies including the CDC and federal and state organizations. These policies and algorithms were followed during the patient's care in the ED.  As part of my medical decision making, I reviewed the following data within the [place] notes reviewed and incorporated, Labs reviewed, notes from prior ED visits and [place] Controlled Substance Database   ____________________________________________   FINAL CLINICAL IMPRESSION(S) / ED DIAGNOSES  Final diagnoses:  COPD exacerbation ([place])  Right lower quadrant abdominal pain      NEW MEDICATIONS STARTED DURING THIS VISIT:  New Prescriptions   No medications on file     Note:  This document was prepared using Dragon voice recognition software and may include unintentional dictation errors.    Merlyn Lot, MD 07/19/19 [phone]

## 2019-07-19 NOTE — ED Triage Notes (Addendum)
Pt arrive to ER c/o RLQ/pelvic pain and L rib cage pain. Pt is SOB, states "a little bit" when asked if shes been coughing. 93% on RA. Pt taking very shallow quick breaths. Placed on nasal cannula and came up to 95%. Hx COPD. States abd swelling.

## 2019-07-22 DIAGNOSIS — R1031 Right lower quadrant pain: Secondary | ICD-10-CM | POA: Diagnosis present

## 2019-07-22 NOTE — H&P (Addendum)
History and Physical    Kristin [name] [id number] DOB: [birth year] DOA: 07/19/2019  PCP: Tracie Harrier, MD  Patient coming from: home  I have personally briefly reviewed patient's old medical records in [place]  Chief Complaint: RLQ abdominal pain  HPI: Kristin [name] is a 74 y.o. female with medical history significant of COPD on chronic 4L, HTN, DM2, CVA who presented complaining of RLQ abdominal pain.   Pt reported her main complaint was abdominal pain in her RLQ, which has been present for at least 2 weeks.  Pain seemed to be positional, worse with being upright, and not when supine.  Pt denied constipation, diarrhea, fever.  This pain was not present during my interview.  Pt was worried about something wrong with her appendix or ovary.    In regards to pt's respiratory, pt said she always has dyspnea, and was at her baseline at the time of my interview.  No increased cough, dyspnea.  O2 sat stable on her baseline 4L O2.    ED Course: initial vitals: afebrile, pulse 109, BP 184/91, RR 30, sating 100% on 4L.  Labs notable for normal WBC, normal LFT's, normal lipase, and neg trop.  UA neg for infection.  CTA chest and CT ab/pelvis showed no acute finding to explain the abdominal pain.  ED provider noted pt to be wheezing and ordered pt DuoNeb and Solumedrol 60 mg.  ED provider requested pt be admitted for observation due to respiratory concerns.    Assessment/Plan Principal Problem:   RLQ abdominal pain  # RLQ abdominal pain --No acute, had been present for at least 2 weeks.  Seemed positional.  CT imaging showed no finding to explain the pain.  Pain most likely due to soft tissue, ligaments, etc. PLAN: --Supportive care, and outpatient PCP followup.  # Chronic hypoxic respiratory failure on 4L  # COPD --Pt was recently discharged on 06/03/19 after being treated for COPD exacerbation.  Currently denied worsening dyspnea or cough. PLAN: --Continue home  bronchodilator regimen --Hold further steroid   # HTN --continue home BP meds  # Hx of CVA --continue home ASA and statin  # Hypothyroidism --continue home Synthroid   DVT prophylaxis: Lovenox SQ Code Status: Full code  Family Communication: husband updated at bedside  Disposition Plan: home  Consults called: none Admission status: Observation   Review of Systems: As per HPI otherwise 10 point review of systems negative.   Past Medical History:  Diagnosis Date  . Asthma   . Cancer [facility]) 2010   Left ear cancer  . COPD (chronic obstructive pulmonary disease) ([place])   . Diabetes mellitus without complication ([place])   . Emphysema lung ([place])   . Hypertension   . Stroke [facility])     Past Surgical History:  Procedure Laterality Date  . ABDOMINAL HYSTERECTOMY    . BREAST BIOPSY Left 1990   neg  . CHOLECYSTECTOMY    . COLONOSCOPY WITH PROPOFOL N/A 12/01/2017   Procedure: COLONOSCOPY WITH PROPOFOL;  Surgeon: Toledo, Benay Pike, MD;  Location: [facility] ENDOSCOPY;  Service: Gastroenterology;  Laterality: N/A;  . ESOPHAGOGASTRODUODENOSCOPY N/A 12/01/2017   Procedure: ESOPHAGOGASTRODUODENOSCOPY (EGD);  Surgeon: Toledo, Benay Pike, MD;  Location: [facility] ENDOSCOPY;  Service: Gastroenterology;  Laterality: N/A;  . NECK SURGERY       reports that she has quit smoking. She has never used smokeless tobacco. She reports that she does not drink alcohol or use drugs.  Allergies  Allergen Reactions  . Codeine Nausea Only  .  Morphine And Related Nausea Only  . Prednisone Other (See Comments)    Elevated BGL into the 300's    Family History  Problem Relation Age of Onset  . Ovarian cancer Mother   . Lung cancer Father   . Breast cancer Neg Hx      Prior to Admission medications   Medication Sig Start Date End Date Taking? Authorizing Provider  amLODipine (NORVASC) 10 MG tablet Take 1 tablet (10 mg total) by mouth daily. 06/04/19  Yes Ghimire, Henreitta Leber, MD  aspirin EC 81 MG EC tablet Take  1 tablet (81 mg total) by mouth daily. Patient taking differently: Take 162 mg by mouth daily.  10/24/15  Yes Wieting, Richard, MD  atorvastatin (LIPITOR) 40 MG tablet Take 40 mg by mouth daily.    Yes [provider]  clonazePAM (KLONOPIN) 1 MG tablet Take 1 mg by mouth in the morning.    Yes [provider]  CRANBERRY EXTRACT PO Take 15,000 mg by mouth every morning.   Yes [provider]  Cyanocobalamin (VITAMIN B-12) 5000 MCG SUBL Place 5,000 mcg under the tongue daily with breakfast.    Yes [provider]  famotidine (PEPCID) 20 MG tablet Take 1 tablet (20 mg total) by mouth daily. 06/04/19  Yes Ghimire, Henreitta Leber, MD  fenofibrate 160 MG tablet Take 160 mg by mouth daily.  10/06/15  Yes [provider]  fexofenadine (ALLEGRA) 180 MG tablet Take 180 mg by mouth daily.   Yes [provider]  fluticasone (FLONASE) 50 MCG/ACT nasal spray Place 1-2 sprays into both nostrils in the morning.   Yes [provider]  fluticasone-salmeterol (ADVAIR HFA) 230-21 MCG/ACT inhaler Inhale 2 puffs into the lungs 2 (two) times daily.   Yes [provider]  levothyroxine (SYNTHROID, LEVOTHROID) 112 MCG tablet Take 112 mcg by mouth daily before breakfast.  09/15/15  Yes [provider]  losartan (COZAAR) 100 MG tablet Take 100 mg by mouth daily.   Yes [provider]  Magnesium 250 MG TABS Take 250 mg by mouth daily.   Yes [provider]  metFORMIN (GLUCOPHAGE) 500 MG tablet Take 500 mg by mouth 2 (two) times daily with a meal.    Yes [provider]  montelukast (SINGULAIR) 10 MG tablet Take 10 mg by mouth at bedtime.  06/06/17  Yes [provider]  OXYGEN Inhale 4 L/min into the lungs continuous.    Yes [provider]  pantoprazole (PROTONIX) 40 MG tablet Take 1 tablet by mouth 2 (two) times daily with breakfast and lunch. 10/20/15  Yes [provider]  Potassium 99 MG TABS Take 198 mg  by mouth daily with breakfast.   Yes [provider]  pramipexole (MIRAPEX) 0.5 MG tablet Take 1-1.5 mg by mouth at bedtime.    Yes [provider]  SPIRIVA HANDIHALER 18 MCG inhalation capsule Place 18 mcg into inhaler and inhale daily. 05/13/16  Yes [provider]  traZODone (DESYREL) 150 MG tablet Take 150 mg by mouth at bedtime. 08/14/17  Yes [provider]  acetaminophen (TYLENOL) 325 MG tablet Take 2 tablets (650 mg total) by mouth every 6 (six) hours as needed for mild pain (or Fever >/= 101). 09/04/17   Gouru, Aruna, MD  hydrALAZINE (APRESOLINE) 50 MG tablet Take 0.5 tablets (25 mg total) by mouth 2 (two) times daily. 07/19/19   Enzo Bi, MD  ipratropium-albuterol (DUONEB) 0.5-2.5 (3) MG/3ML SOLN Take 3 mLs by nebulization every 4 (  four) hours as needed. Patient not taking: Reported on 05/30/2019 05/26/16   Gladstone Lighter, MD  polyethylene glycol powder (GLYCOLAX/MIRALAX) 17 GM/SCOOP powder Take 17 g by mouth daily as needed (for constipation- mixed into 4-8 ounces of desired beverage).  10/27/17   [provider]  VENTOLIN HFA 108 (90 Base) MCG/ACT inhaler Inhale 2 puffs into the lungs every 3 (three) hours as needed for wheezing or shortness of breath.  10/11/15   [provider]    Physical Exam: Vitals:   07/19/19 1500 07/19/19 1530 07/19/19 1600 07/19/19 1708  BP: (!) 112/58 (!) 128/96 (!) 121/52 (!) 159/61  Pulse: 83 92 82 91  Resp: (!) 26 (!) 24 13 20   Temp:    98.1 F (36.7 C)  TempSrc:    Oral  SpO2: 99% 98% 100% 99%  Weight:      Height:        Constitutional: NAD, AAOx3 HEENT: conjunctivae and lids normal, EOMI CV: RRR no M,R,G. Distal pulses +2.  No cyanosis.   RESP: reduced lung sounds, on 4L GI: +BS, NTND, soft Extremities: No effusions, edema, or tenderness in BLE SKIN: warm, dry and intact Neuro: II - XII grossly intact.  Sensation intact Psych: Normal mood and affect.  Appropriate judgement and  reason   Labs on Admission: I have personally reviewed following labs and imaging studies  CBC: Recent Labs  Lab 07/19/19 1104  WBC 5.0  HGB 11.4*  HCT 35.8*  MCV 98.4  PLT 99991111   Basic Metabolic Panel: Recent Labs  Lab 07/19/19 1104  NA 138  K 3.8  CL 106  CO2 26  GLUCOSE 236*  BUN 13  CREATININE 0.78  CALCIUM 9.3   GFR: Estimated Creatinine Clearance: 54.1 mL/min (by C-G formula based on SCr of 0.78 mg/dL). Liver Function Tests: Recent Labs  Lab 07/19/19 1104  AST 15  ALT 12  ALKPHOS 35*  BILITOT 0.7  PROT 6.9  ALBUMIN 4.6   Recent Labs  Lab 07/19/19 1104  LIPASE 42   No results for input(s): AMMONIA in the last 168 hours. Coagulation Profile: No results for input(s): INR, PROTIME in the last 168 hours. Cardiac Enzymes: No results for input(s): CKTOTAL, CKMB, CKMBINDEX, TROPONINI in the last 168 hours. BNP (last 3 results) No results for input(s): PROBNP in the last 8760 hours. HbA1C: No results for input(s): HGBA1C in the last 72 hours. CBG: No results for input(s): GLUCAP in the last 168 hours. Lipid Profile: No results for input(s): CHOL, HDL, LDLCALC, TRIG, CHOLHDL, LDLDIRECT in the last 72 hours. Thyroid Function Tests: No results for input(s): TSH, T4TOTAL, FREET4, T3FREE, THYROIDAB in the last 72 hours. Anemia Panel: No results for input(s): VITAMINB12, FOLATE, FERRITIN, TIBC, IRON, RETICCTPCT in the last 72 hours. Urine analysis:    Component Value Date/Time   COLORURINE STRAW (A) 07/19/2019 1404   APPEARANCEUR CLEAR (A) 07/19/2019 1404   APPEARANCEUR Clear 03/18/2013 1319   LABSPEC 1.039 (H) 07/19/2019 1404   LABSPEC 1.005 03/18/2013 1319   PHURINE 7.0 07/19/2019 1404   GLUCOSEU >=500 (A) 07/19/2019 1404   GLUCOSEU Negative 03/18/2013 1319   HGBUR NEGATIVE 07/19/2019 1404   BILIRUBINUR NEGATIVE 07/19/2019 1404   BILIRUBINUR Negative 03/18/2013 1319   KETONESUR NEGATIVE 07/19/2019 1404   PROTEINUR NEGATIVE 07/19/2019 1404    UROBILINOGEN 1.0 06/24/2008 1545   NITRITE NEGATIVE 07/19/2019 1404   LEUKOCYTESUR NEGATIVE 07/19/2019 1404   LEUKOCYTESUR 1+ 03/18/2013 1319    Radiological Exams on Admission: No results found.  Enzo Bi MD [facility]  If 7PM-7AM, please contact night-coverage 07/22/2019, 2:58 AM

## 2019-09-12 ENCOUNTER — Ambulatory Visit: Payer: Medicare Other

## 2019-09-12 ENCOUNTER — Other Ambulatory Visit (HOSPITAL_COMMUNITY): Payer: Self-pay | Admitting: Internal Medicine

## 2019-09-12 ENCOUNTER — Other Ambulatory Visit: Payer: Self-pay | Admitting: Internal Medicine

## 2019-09-12 DIAGNOSIS — M25473 Effusion, unspecified ankle: Secondary | ICD-10-CM

## 2019-09-13 ENCOUNTER — Ambulatory Visit
Admission: RE | Admit: 2019-09-13 | Discharge: 2019-09-13 | Disposition: A | Discharge status: Home / Self Care | Payer: Medicare Other | Source: Ambulatory Visit | Attending: Internal Medicine | Admitting: Internal Medicine

## 2019-09-13 ENCOUNTER — Other Ambulatory Visit: Payer: Self-pay

## 2019-09-13 ENCOUNTER — Other Ambulatory Visit: Payer: Self-pay | Admitting: Internal Medicine

## 2019-09-13 DIAGNOSIS — M25473 Effusion, unspecified ankle: Secondary | ICD-10-CM

## 2019-09-13 DIAGNOSIS — M79672 Pain in left foot: Secondary | ICD-10-CM

## 2019-09-13 DIAGNOSIS — R2242 Localized swelling, mass and lump, left lower limb: Secondary | ICD-10-CM

## 2019-09-29 ENCOUNTER — Other Ambulatory Visit: Payer: Self-pay

## 2019-09-29 ENCOUNTER — Ambulatory Visit
Admission: RE | Admit: 2019-09-29 | Discharge: 2019-09-29 | Disposition: A | Discharge status: Home / Self Care | Payer: Medicare Other | Source: Ambulatory Visit | Attending: Internal Medicine | Admitting: Internal Medicine

## 2019-09-29 DIAGNOSIS — M79672 Pain in left foot: Secondary | ICD-10-CM | POA: Insufficient documentation

## 2019-09-29 DIAGNOSIS — R2242 Localized swelling, mass and lump, left lower limb: Secondary | ICD-10-CM | POA: Diagnosis present

## 2019-09-29 DIAGNOSIS — M25473 Effusion, unspecified ankle: Secondary | ICD-10-CM | POA: Insufficient documentation

## 2019-09-29 MED ORDER — GADOBUTROL 1 MMOL/ML IV SOLN
5.0000 mL | Freq: Once | INTRAVENOUS | Status: AC | PRN
  Administered 2019-09-29: 5 mL via INTRAVENOUS

## 2019-11-07 ENCOUNTER — Ambulatory Visit: Payer: Medicare Other

## 2019-11-29 ENCOUNTER — Other Ambulatory Visit: Payer: Self-pay | Admitting: Internal Medicine

## 2019-11-29 DIAGNOSIS — Z1231 Encounter for screening mammogram for malignant neoplasm of breast: Secondary | ICD-10-CM

## 2020-02-08 ENCOUNTER — Other Ambulatory Visit: Payer: Self-pay

## 2020-02-08 ENCOUNTER — Ambulatory Visit
Admission: RE | Admit: 2020-02-08 | Discharge: 2020-02-08 | Disposition: A | Discharge status: Home / Self Care | Payer: Medicare Other | Source: Ambulatory Visit | Attending: Internal Medicine | Admitting: Internal Medicine

## 2020-02-08 DIAGNOSIS — Z1231 Encounter for screening mammogram for malignant neoplasm of breast: Secondary | ICD-10-CM | POA: Diagnosis not present

## 2020-02-19 ENCOUNTER — Other Ambulatory Visit: Payer: Self-pay

## 2020-02-19 ENCOUNTER — Encounter: Payer: Self-pay | Admitting: Emergency Medicine

## 2020-02-19 ENCOUNTER — Emergency Department: Payer: Medicare Other

## 2020-02-19 ENCOUNTER — Emergency Department
Admission: EM | Admit: 2020-02-19 | Discharge: 2020-02-19 | Disposition: A | Discharge status: Home / Self Care | Payer: Medicare Other | Source: Home / Self Care | Attending: Emergency Medicine | Admitting: Emergency Medicine

## 2020-02-19 DIAGNOSIS — J441 Chronic obstructive pulmonary disease with (acute) exacerbation: Secondary | ICD-10-CM

## 2020-02-19 DIAGNOSIS — I10 Essential (primary) hypertension: Secondary | ICD-10-CM | POA: Insufficient documentation

## 2020-02-19 DIAGNOSIS — Z20822 Contact with and (suspected) exposure to covid-19: Secondary | ICD-10-CM | POA: Insufficient documentation

## 2020-02-19 DIAGNOSIS — R0602 Shortness of breath: Secondary | ICD-10-CM | POA: Diagnosis not present

## 2020-02-19 DIAGNOSIS — Z8673 Personal history of transient ischemic attack (TIA), and cerebral infarction without residual deficits: Secondary | ICD-10-CM | POA: Insufficient documentation

## 2020-02-19 DIAGNOSIS — Z87891 Personal history of nicotine dependence: Secondary | ICD-10-CM | POA: Insufficient documentation

## 2020-02-19 DIAGNOSIS — Z8522 Personal history of malignant neoplasm of nasal cavities, middle ear, and accessory sinuses: Secondary | ICD-10-CM | POA: Insufficient documentation

## 2020-02-19 DIAGNOSIS — R0789 Other chest pain: Secondary | ICD-10-CM | POA: Insufficient documentation

## 2020-02-19 DIAGNOSIS — Z8616 Personal history of COVID-19: Secondary | ICD-10-CM | POA: Insufficient documentation

## 2020-02-19 DIAGNOSIS — N3000 Acute cystitis without hematuria: Secondary | ICD-10-CM

## 2020-02-19 DIAGNOSIS — Z7984 Long term (current) use of oral hypoglycemic drugs: Secondary | ICD-10-CM | POA: Insufficient documentation

## 2020-02-19 DIAGNOSIS — Z7982 Long term (current) use of aspirin: Secondary | ICD-10-CM | POA: Insufficient documentation

## 2020-02-19 DIAGNOSIS — E119 Type 2 diabetes mellitus without complications: Secondary | ICD-10-CM | POA: Insufficient documentation

## 2020-02-19 DIAGNOSIS — Z79899 Other long term (current) drug therapy: Secondary | ICD-10-CM | POA: Insufficient documentation

## 2020-02-19 LAB — URINALYSIS, COMPLETE (UACMP) WITH MICROSCOPIC
Bilirubin Urine: NEGATIVE
Glucose, UA: NEGATIVE mg/dL
Hgb urine dipstick: NEGATIVE
Ketones, ur: NEGATIVE mg/dL
Nitrite: NEGATIVE
Protein, ur: NEGATIVE mg/dL
Specific Gravity, Urine: 1.016 (ref 1.005–1.030)
Squamous Epithelial / HPF: NONE SEEN (ref 0–5)
WBC, UA: >50 WBC/hpf — ABNORMAL HIGH (ref 0–5)
pH: 6 (ref 5.0–8.0)

## 2020-02-19 LAB — BASIC METABOLIC PANEL
Anion gap: 8 (ref 5–15)
BUN: 24 mg/dL — ABNORMAL HIGH (ref 8–23)
CO2: 27 mmol/L (ref 22–32)
Calcium: 10 mg/dL (ref 8.9–10.3)
Chloride: 102 mmol/L (ref 98–111)
Creatinine, Ser: 1.06 mg/dL — ABNORMAL HIGH (ref 0.44–1.00)
GFR, Estimated: 55 mL/min — ABNORMAL LOW (ref >60)
Glucose, Bld: 139 mg/dL — ABNORMAL HIGH (ref 70–99)
Potassium: 4.5 mmol/L (ref 3.5–5.1)
Sodium: 137 mmol/L (ref 135–145)

## 2020-02-19 LAB — TROPONIN I (HIGH SENSITIVITY)
Troponin I (High Sensitivity): 7 ng/L (ref <18)
Troponin I (High Sensitivity): 7 ng/L (ref <18)

## 2020-02-19 LAB — CBC
HCT: 35.4 % — ABNORMAL LOW (ref 36.0–46.0)
Hemoglobin: 11.7 g/dL — ABNORMAL LOW (ref 12.0–15.0)
MCH: 31.5 pg (ref 26.0–34.0)
MCHC: 33.1 g/dL (ref 30.0–36.0)
MCV: 95.4 fL (ref 80.0–100.0)
Platelets: 205 10*3/uL (ref 150–400)
RBC: 3.71 MIL/uL — ABNORMAL LOW (ref 3.87–5.11)
RDW: 13.4 % (ref 11.5–15.5)
WBC: 7.3 10*3/uL (ref 4.0–10.5)
nRBC: 0 % (ref 0.0–0.2)

## 2020-02-19 LAB — RESP PANEL BY RT-PCR (FLU A&B, COVID) ARPGX2
Influenza A by PCR: NEGATIVE
Influenza B by PCR: NEGATIVE
SARS Coronavirus 2 by RT PCR: NEGATIVE

## 2020-02-19 MED ORDER — METHYLPREDNISOLONE SODIUM SUCC 125 MG IJ SOLR
80.0000 mg | Freq: Once | INTRAMUSCULAR | Status: AC
  Administered 2020-02-19: 80 mg via INTRAVENOUS
  Filled 2020-02-19: qty 2

## 2020-02-19 MED ORDER — IPRATROPIUM-ALBUTEROL 0.5-2.5 (3) MG/3ML IN SOLN: 3.0000 mL | Freq: Two times a day (BID) | RESPIRATORY_TRACT | 0 refills | Status: DC

## 2020-02-19 MED ORDER — IPRATROPIUM-ALBUTEROL 0.5-2.5 (3) MG/3ML IN SOLN
3.0000 mL | Freq: Once | RESPIRATORY_TRACT | Status: AC
  Administered 2020-02-19: 3 mL via RESPIRATORY_TRACT
  Filled 2020-02-19: qty 3

## 2020-02-19 MED ORDER — ALBUTEROL SULFATE (2.5 MG/3ML) 0.083% IN NEBU
2.5000 mg | INHALATION_SOLUTION | Freq: Once | RESPIRATORY_TRACT | Status: AC
  Administered 2020-02-19: 2.5 mg via RESPIRATORY_TRACT
  Filled 2020-02-19: qty 3

## 2020-02-19 MED ORDER — CEPHALEXIN 500 MG PO CAPS: 500.0000 mg | ORAL_CAPSULE | Freq: Two times a day (BID) | ORAL | 0 refills | Status: DC

## 2020-02-19 NOTE — ED Notes (Signed)
Report given to April, RN

## 2020-02-19 NOTE — ED Triage Notes (Signed)
Pt arrived via POV with reports of shortness of breath and chest tightness x 1 week, pt has hx of COPD, pt states she thought she would get over it  Pt wears 4L [place] continuously.  Pt short of breath speaking in short sentences, pursed lip breathing noted.

## 2020-02-19 NOTE — ED Provider Notes (Signed)
[facility] Emergency Department Provider Note ____________________________________________   First MD Initiated Contact with Patient 02/19/20 1746     (approximate)  I have reviewed the triage vital signs and the nursing notes.   HISTORY  Chief Complaint Shortness of Breath  HPI Kristin [name] is a 74 y.o. female with history of COPD, DM, HTN, CVA presents to the emergency department for treatment and evaluation of shortness of breath and chest tightness for the past week. She was exposed to a grandchild that had RSV. She is on 4 liters of oxygen at home. Breathing is worse with any exertion. Bilateral lower extremities have been swollen over the past 3 days, but are better since she has been elevating them on pillows while in the recliner.      Past Medical History:  Diagnosis Date  . Asthma   . 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 COPD (chronic obstructive pulmonary disease) ([place])   . COVID-19 05/27/2019  . Diabetes mellitus without complication ([place])   . Emphysema lung ([place])   . Hypertension   . Stroke [facility])     Patient Active Problem List   Diagnosis Date Noted  . RLQ abdominal pain 07/22/2019  . COPD exacerbation ([place]) 07/19/2019  . Acute respiratory failure due to COVID-19 ([place]) 05/30/2019  . Carotid stenosis 10/26/2017  . Diabetes ([place]) 10/26/2017  . Essential hypertension 10/26/2017  . COPD (chronic obstructive pulmonary disease) ([place]) 10/26/2017  . Pyelonephritis 09/02/2017  . UTI (urinary tract infection) 11/30/2016  . Encephalopathy acute 11/30/2016  . Chronic respiratory failure with hypoxia ([place]) 11/30/2016  . Weakness generalized 11/30/2016  . Acute encephalopathy 11/30/2016  . Acute respiratory failure ([place]) 05/23/2016  . Malnutrition of moderate degree 05/23/2016  . Syncope 10/23/2015    Past Surgical History:  Procedure Laterality Date  . ABDOMINAL HYSTERECTOMY    . BREAST BIOPSY Left 1990   neg  .  CHOLECYSTECTOMY    . COLONOSCOPY WITH PROPOFOL N/A 12/01/2017   Procedure: COLONOSCOPY WITH PROPOFOL;  Surgeon: Toledo, Benay Pike, MD;  Location: [facility] ENDOSCOPY;  Service: Gastroenterology;  Laterality: N/A;  . ESOPHAGOGASTRODUODENOSCOPY N/A 12/01/2017   Procedure: ESOPHAGOGASTRODUODENOSCOPY (EGD);  Surgeon: Toledo, Benay Pike, MD;  Location: [facility] ENDOSCOPY;  Service: Gastroenterology;  Laterality: N/A;  . NECK SURGERY      Prior to Admission medications   Medication Sig Start Date End Date Taking? Authorizing Provider  acetaminophen (TYLENOL) 325 MG tablet Take 2 tablets (650 mg total) by mouth every 6 (six) hours as needed for mild pain (or Fever >/= 101). 09/04/17   Gouru, Aruna, MD  amLODipine (NORVASC) 10 MG tablet Take 1 tablet (10 mg total) by mouth daily. 06/04/19   Ghimire, Henreitta Leber, MD  aspirin EC 81 MG EC tablet Take 1 tablet (81 mg total) by mouth daily. Patient taking differently: Take 162 mg by mouth daily.  10/24/15   Loletha Grayer, MD  atorvastatin (LIPITOR) 40 MG tablet Take 40 mg by mouth daily.     [provider]  cephALEXin (KEFLEX) 500 MG capsule Take 1 capsule (500 mg total) by mouth 2 (two) times daily for 7 days. 02/19/20 02/26/20  Willie Loy, Dessa Phi, FNP  clonazePAM (KLONOPIN) 1 MG tablet Take 1 mg by mouth in the morning.     [provider]  CRANBERRY EXTRACT PO Take 15,000 mg by mouth every morning.    [provider]  Cyanocobalamin (VITAMIN B-12) 5000 MCG SUBL Place 5,000 mcg under the tongue  daily with breakfast.     [provider]  famotidine (PEPCID) 20 MG tablet Take 1 tablet (20 mg total) by mouth daily. 06/04/19   Ghimire, Henreitta Leber, MD  fenofibrate 160 MG tablet Take 160 mg by mouth daily.  10/06/15   [provider]  fexofenadine (ALLEGRA) 180 MG tablet Take 180 mg by mouth daily.    [provider]  fluticasone (FLONASE) 50 MCG/ACT nasal spray Place 1-2 sprays into both nostrils in the morning.    [provider]  fluticasone-salmeterol (ADVAIR HFA) 230-21 MCG/ACT inhaler Inhale 2 puffs into the lungs 2 (two) times daily.    [provider]  hydrALAZINE (APRESOLINE) 50 MG tablet Take 0.5 tablets (25 mg total) by mouth 2 (two) times daily. 07/19/19   Enzo Bi, MD  ipratropium-albuterol (DUONEB) 0.5-2.5 (3) MG/3ML SOLN Take 3 mLs by nebulization in the morning and at bedtime. 02/19/20   Caylan Chenard, Johnette Abraham B, FNP  levothyroxine (SYNTHROID, LEVOTHROID) 112 MCG tablet Take 112 mcg by mouth daily before breakfast.  09/15/15   [provider]  losartan (COZAAR) 100 MG tablet Take 100 mg by mouth daily.    [provider]  Magnesium 250 MG TABS Take 250 mg by mouth daily.    [provider]  metFORMIN (GLUCOPHAGE) 500 MG tablet Take 500 mg by mouth 2 (two) times daily with a meal.     [provider]  montelukast (SINGULAIR) 10 MG tablet Take 10 mg by mouth at bedtime.  06/06/17   [provider]  OXYGEN Inhale 4 L/min into the lungs continuous.     [provider]  pantoprazole (PROTONIX) 40 MG tablet Take 1 tablet by mouth 2 (two) times daily with breakfast and lunch. 10/20/15   [provider]  polyethylene glycol powder (GLYCOLAX/MIRALAX) 17 GM/SCOOP powder Take 17 g by mouth daily as needed (for constipation- mixed into 4-8 ounces of desired beverage).  10/27/17   [provider]  Potassium 99 MG TABS Take 198 mg by mouth daily with breakfast.    [provider]  pramipexole (MIRAPEX) 0.5 MG tablet Take 1-1.5 mg by mouth at bedtime.     [provider]  SPIRIVA HANDIHALER 18 MCG inhalation capsule Place 18 mcg into inhaler and inhale daily. 05/13/16   [provider]  traZODone (DESYREL) 150 MG tablet Take 150 mg by mouth at bedtime. 08/14/17   [provider]  VENTOLIN HFA 108 (90 Base) MCG/ACT inhaler Inhale 2 puffs into the lungs every 3 (three) hours as needed for wheezing or shortness of  breath.  10/11/15   [provider]    Allergies Codeine, Morphine and related, and Prednisone  Family History  Problem Relation Age of Onset  . Ovarian cancer Mother   . Lung cancer Father   . Breast cancer Paternal Aunt     Social History Social History   Tobacco Use  . Smoking status: Former Research scientist (life sciences)  . Smokeless tobacco: Never Used  Vaping Use  . Vaping Use: Never used  Substance Use Topics  . Alcohol use: No  . Drug use: No    Review of Systems  Constitutional: No fever/chills Eyes: No visual changes. ENT: No sore throat. Cardiovascular: Positive for chest tightness. Respiratory: Positive for shortness of breath. Gastrointestinal: No abdominal pain.  No nausea, no vomiting.  No diarrhea.  No constipation. Genitourinary: Negative for dysuria. Musculoskeletal: Negative for back pain. Skin: Negative for rash. Neurological: Negative for headaches, focal weakness or numbness.  ____________________________________________   PHYSICAL EXAM:  VITAL SIGNS: ED Triage Vitals  Enc Vitals Group     BP 02/19/20 1711 (!) 149/76     Pulse Rate 02/19/20 1711 90     Resp 02/19/20 1711 (!) 28     Temp 02/19/20 1711 98.2 F (36.8 C)     Temp Source 02/19/20 1711 Oral     SpO2 02/19/20 1711 100 %     Weight 02/19/20 1714 130 lb (59 kg)     Height 02/19/20 1714 5\' 4"  (1.626 m)     Head Circumference --      Peak Flow --      Pain Score 02/19/20 1713 10     Pain Loc --      Pain Edu? --      Excl. in [place]? --     Constitutional: Alert and oriented. Chronically ill appearing and in no acute distress. Eyes: Conjunctivae are normal.  Head: Atraumatic. Nose: No congestion/rhinnorhea. Mouth/Throat: Mucous membranes are moist. Oropharynx non-erythematous. Neck: No stridor.   Hematological/Lymphatic/Immunilogical: No cervical lymphadenopathy. Cardiovascular: Normal rate, regular rhythm. Grossly normal heart sounds.  Good peripheral circulation. Respiratory: Increased  respiratory effort.  No retractions. Breath sounds diminished throughout with expiratory wheeze in left upper and lower lobes. Gastrointestinal: Soft and nontender. No distention. No abdominal bruits. Genitourinary:  Musculoskeletal: No lower extremity tenderness nor edema.  No joint effusions. Neurologic:  Normal speech and language. No gross focal neurologic deficits are appreciated. No gait instability. Skin:  Skin is warm, dry and intact. No rash noted. Psychiatric: Mood and affect are normal. Speech and behavior are normal.  ____________________________________________   LABS (all labs ordered are listed, but only abnormal results are displayed)  Labs Reviewed  BASIC METABOLIC PANEL - Abnormal; Notable for the following components:      Result Value   Glucose, Bld 139 (*)    BUN 24 (*)    Creatinine, Ser 1.06 (*)    GFR, Estimated 55 (*)    All other components within normal limits  CBC - Abnormal; Notable for the following components:   RBC 3.71 (*)    Hemoglobin 11.7 (*)    HCT 35.4 (*)    All other components within normal limits  URINALYSIS, COMPLETE (UACMP) WITH MICROSCOPIC - Abnormal; Notable for the following components:   Color, Urine YELLOW (*)    APPearance HAZY (*)    Leukocytes,Ua SMALL (*)    WBC, UA >50 (*)    Bacteria, UA RARE (*)    All other components within normal limits  RESP PANEL BY RT-PCR (FLU A&B, COVID) ARPGX2  TROPONIN I (HIGH SENSITIVITY)  TROPONIN I (HIGH SENSITIVITY)   ____________________________________________  EKG  ED ECG REPORT I, Michale Weikel, FNP-BC personally viewed and interpreted this ECG.   Date: 02/19/2020  EKG Time: 1714  Rate: 91  Rhythm: normal sinus rhythm  Axis: left  Intervals:none  ST&T Change: no ST elevation  ____________________________________________  RADIOLOGY  ED MD interpretation:    Chest x-ray negative for acute concerns.  I, Sherrie George, personally viewed and evaluated these images (plain  radiographs) as part of my medical decision making, as well as reviewing the written report by the radiologist.  Official radiology report(s): DG Chest 2 View  Result Date: 02/19/2020 CLINICAL DATA:  Short of breath.  COPD. EXAM: CHEST - 2 VIEW COMPARISON:  07/19/2019 FINDINGS: Cardiac silhouette is normal in size. No mediastinal or hilar masses or evidence of adenopathy. Lungs are hyperexpanded. Linear stable  scarring noted at the lung bases. Lungs otherwise clear. No pleural effusion or pneumothorax. Skeletal structures are demineralized, but intact. Stable changes from a previous anterior cervical spine fusion. IMPRESSION: 1. No acute cardiopulmonary disease. 2. COPD.  Chronic lung base scarring. Electronically Signed   By: Lajean Manes M.D.   On: 02/19/2020 18:09    ____________________________________________   PROCEDURES  Procedure(s) performed (including Critical Care):  Procedures  ____________________________________________   INITIAL IMPRESSION / ASSESSMENT AND PLAN    74 year old female presenting to the emergency department for treatment and evaluation of shortness of breath.  See HPI for further details.  Plan will be to review results of protocol started in triage. DIFFERENTIAL DIAGNOSIS  COPD exacerbation, Covid 2, pneumonia, influenza, URI  ED COURSE  Exam reveals wheezing in left upper and lower lobes and overall diminished breath sounds. Will treat with SoluMedrol and DuoNeb. Allergy to prednisone listed with reaction being hyperglycemia. Benefit outweighs risk at this time. Chest x-ray is reassuring. Patient is on her 4 liters of oxygen and 100% while at rest on stretcher. Will reassess after breathing treatment. Will also get COVID and influenza test.    Clinical Course as of Feb 19 1721  Nancy Fetter Feb 19, 2020  1944 Less work of breathing after [employer]. She states that the tightness in her chest is gone as well. Plan will be to await results of the  second troponin and COVID test.    [CT]    Clinical Course User Index [CT] Artavious Trebilcock B, FNP    Much improved after albuterol. Patient states that she only uses her albuterol inhaler once daily because she doesn't like to "sit that long." She was advised that she needs to use it every 4-6 hours if she feels short of breath or has persistent wheezing or cough. While here, DuoNeb provided significant relief and she will receive a prescription upon discharge. She tells me her breathing is back to baseline and remains 100% on her home O2 at 4L. She declines prescription for steroid as it causes hyperglycemia and "tears up" her nerves. She feels comfortable going home. She was advised to follow up with pulmonology/PCP. ___________________________________________   FINAL CLINICAL IMPRESSION(S) / ED DIAGNOSES  Final diagnoses:  COPD with acute exacerbation ([place])  Acute cystitis without hematuria     ED Discharge Orders         Ordered    ipratropium-albuterol (DUONEB) 0.5-2.5 (3) MG/3ML SOLN  2 times daily        02/19/20 2044    cephALEXin (KEFLEX) 500 MG capsule  2 times daily        02/19/20 2045           Kristin [name] was evaluated in Emergency Department on 02/20/2020 for the symptoms described in the history of present illness. She was evaluated in the context of the global COVID-19 pandemic, which necessitated consideration that the patient might be at risk for infection with the SARS-CoV-2 virus that causes COVID-19. Institutional protocols and algorithms that pertain to the evaluation of patients at risk for COVID-19 are in a state of rapid change based on information released by regulatory bodies including the CDC and federal and state organizations. These policies and algorithms were followed during the patient's care in the ED.   Note:  This document was prepared using Dragon voice recognition software and may include unintentional dictation errors.   Victorino Dike, FNP 02/20/20 1722    Nance Pear, MD 02/27/20 [phone]

## 2020-02-21 ENCOUNTER — Other Ambulatory Visit: Payer: Self-pay

## 2020-02-21 ENCOUNTER — Inpatient Hospital Stay
Admission: EM | Admit: 2020-02-21 | Discharge: 2020-02-26 | DRG: 190 | Disposition: A | Discharge status: Home / Self Care | Payer: Medicare Other | Attending: Internal Medicine | Admitting: Internal Medicine

## 2020-02-21 ENCOUNTER — Emergency Department: Payer: Medicare Other

## 2020-02-21 DIAGNOSIS — E039 Hypothyroidism, unspecified: Secondary | ICD-10-CM | POA: Diagnosis present

## 2020-02-21 DIAGNOSIS — R519 Headache, unspecified: Secondary | ICD-10-CM | POA: Diagnosis present

## 2020-02-21 DIAGNOSIS — Z7951 Long term (current) use of inhaled steroids: Secondary | ICD-10-CM

## 2020-02-21 DIAGNOSIS — Z79899 Other long term (current) drug therapy: Secondary | ICD-10-CM

## 2020-02-21 DIAGNOSIS — J441 Chronic obstructive pulmonary disease with (acute) exacerbation: Principal | ICD-10-CM | POA: Diagnosis present

## 2020-02-21 DIAGNOSIS — Z87891 Personal history of nicotine dependence: Secondary | ICD-10-CM

## 2020-02-21 DIAGNOSIS — R531 Weakness: Secondary | ICD-10-CM

## 2020-02-21 DIAGNOSIS — Z888 Allergy status to other drugs, medicaments and biological substances status: Secondary | ICD-10-CM

## 2020-02-21 DIAGNOSIS — G20 Parkinson's disease: Secondary | ICD-10-CM | POA: Diagnosis present

## 2020-02-21 DIAGNOSIS — Z9049 Acquired absence of other specified parts of digestive tract: Secondary | ICD-10-CM

## 2020-02-21 DIAGNOSIS — Z8673 Personal history of transient ischemic attack (TIA), and cerebral infarction without residual deficits: Secondary | ICD-10-CM

## 2020-02-21 DIAGNOSIS — Z20822 Contact with and (suspected) exposure to covid-19: Secondary | ICD-10-CM | POA: Diagnosis present

## 2020-02-21 DIAGNOSIS — Z801 Family history of malignant neoplasm of trachea, bronchus and lung: Secondary | ICD-10-CM

## 2020-02-21 DIAGNOSIS — E119 Type 2 diabetes mellitus without complications: Secondary | ICD-10-CM

## 2020-02-21 DIAGNOSIS — K529 Noninfective gastroenteritis and colitis, unspecified: Secondary | ICD-10-CM | POA: Diagnosis present

## 2020-02-21 DIAGNOSIS — Z90710 Acquired absence of both cervix and uterus: Secondary | ICD-10-CM

## 2020-02-21 DIAGNOSIS — Z7982 Long term (current) use of aspirin: Secondary | ICD-10-CM

## 2020-02-21 DIAGNOSIS — R0902 Hypoxemia: Secondary | ICD-10-CM

## 2020-02-21 DIAGNOSIS — Z803 Family history of malignant neoplasm of breast: Secondary | ICD-10-CM

## 2020-02-21 DIAGNOSIS — Z885 Allergy status to narcotic agent status: Secondary | ICD-10-CM

## 2020-02-21 DIAGNOSIS — J9600 Acute respiratory failure, unspecified whether with hypoxia or hypercapnia: Secondary | ICD-10-CM | POA: Diagnosis present

## 2020-02-21 DIAGNOSIS — Z79890 Hormone replacement therapy (postmenopausal): Secondary | ICD-10-CM

## 2020-02-21 DIAGNOSIS — J9611 Chronic respiratory failure with hypoxia: Secondary | ICD-10-CM | POA: Diagnosis present

## 2020-02-21 DIAGNOSIS — N390 Urinary tract infection, site not specified: Secondary | ICD-10-CM | POA: Diagnosis present

## 2020-02-21 DIAGNOSIS — R0602 Shortness of breath: Secondary | ICD-10-CM

## 2020-02-21 DIAGNOSIS — I10 Essential (primary) hypertension: Secondary | ICD-10-CM | POA: Diagnosis present

## 2020-02-21 DIAGNOSIS — K219 Gastro-esophageal reflux disease without esophagitis: Secondary | ICD-10-CM | POA: Diagnosis present

## 2020-02-21 DIAGNOSIS — J9621 Acute and chronic respiratory failure with hypoxia: Secondary | ICD-10-CM | POA: Diagnosis present

## 2020-02-21 DIAGNOSIS — Z7984 Long term (current) use of oral hypoglycemic drugs: Secondary | ICD-10-CM

## 2020-02-21 LAB — RESP PANEL BY RT-PCR (FLU A&B, COVID) ARPGX2
Influenza A by PCR: NEGATIVE
Influenza B by PCR: NEGATIVE
SARS Coronavirus 2 by RT PCR: NEGATIVE

## 2020-02-21 LAB — TSH: TSH: 4.716 u[IU]/mL — ABNORMAL HIGH (ref 0.350–4.500)

## 2020-02-21 LAB — CBC WITH DIFFERENTIAL/PLATELET
Abs Immature Granulocytes: 0.17 10*3/uL — ABNORMAL HIGH (ref 0.00–0.07)
Basophils Absolute: 0.1 10*3/uL (ref 0.0–0.1)
Basophils Relative: 1 %
Eosinophils Absolute: 0.2 10*3/uL (ref 0.0–0.5)
Eosinophils Relative: 1 %
HCT: 34.5 % — ABNORMAL LOW (ref 36.0–46.0)
Hemoglobin: 11 g/dL — ABNORMAL LOW (ref 12.0–15.0)
Immature Granulocytes: 2 %
Lymphocytes Relative: 30 %
Lymphs Abs: 3.4 10*3/uL (ref 0.7–4.0)
MCH: 30.6 pg (ref 26.0–34.0)
MCHC: 31.9 g/dL (ref 30.0–36.0)
MCV: 95.8 fL (ref 80.0–100.0)
Monocytes Absolute: 1.1 10*3/uL — ABNORMAL HIGH (ref 0.1–1.0)
Monocytes Relative: 9 %
Neutro Abs: 6.6 10*3/uL (ref 1.7–7.7)
Neutrophils Relative %: 57 %
Platelets: 202 10*3/uL (ref 150–400)
RBC: 3.6 MIL/uL — ABNORMAL LOW (ref 3.87–5.11)
RDW: 13.6 % (ref 11.5–15.5)
WBC: 11.4 10*3/uL — ABNORMAL HIGH (ref 4.0–10.5)
nRBC: 0 % (ref 0.0–0.2)

## 2020-02-21 LAB — COMPREHENSIVE METABOLIC PANEL
ALT: 16 U/L (ref 0–44)
AST: 16 U/L (ref 15–41)
Albumin: 4.3 g/dL (ref 3.5–5.0)
Alkaline Phosphatase: 39 U/L (ref 38–126)
Anion gap: 11 (ref 5–15)
BUN: 22 mg/dL (ref 8–23)
CO2: 23 mmol/L (ref 22–32)
Calcium: 9.2 mg/dL (ref 8.9–10.3)
Chloride: 103 mmol/L (ref 98–111)
Creatinine, Ser: 1.03 mg/dL — ABNORMAL HIGH (ref 0.44–1.00)
GFR, Estimated: 57 mL/min — ABNORMAL LOW (ref >60)
Glucose, Bld: 186 mg/dL — ABNORMAL HIGH (ref 70–99)
Potassium: 3.9 mmol/L (ref 3.5–5.1)
Sodium: 137 mmol/L (ref 135–145)
Total Bilirubin: 0.8 mg/dL (ref 0.3–1.2)
Total Protein: 6.7 g/dL (ref 6.5–8.1)

## 2020-02-21 LAB — MAGNESIUM: Magnesium: 3.5 mg/dL — ABNORMAL HIGH (ref 1.7–2.4)

## 2020-02-21 LAB — BLOOD GAS, ARTERIAL
Acid-base deficit: 1.2 mmol/L (ref 0.0–2.0)
Bicarbonate: 23.6 mmol/L (ref 20.0–28.0)
Delivery systems: POSITIVE
Expiratory PAP: 5
FIO2: 0.3
Inspiratory PAP: 10
O2 Saturation: 95.1 %
Patient temperature: 37
pCO2 arterial: 39 mmHg (ref 32.0–48.0)
pH, Arterial: 7.39 (ref 7.350–7.450)
pO2, Arterial: 77 mmHg — ABNORMAL LOW (ref 83.0–108.0)

## 2020-02-21 LAB — CBG MONITORING, ED
Glucose-Capillary: 282 mg/dL — ABNORMAL HIGH (ref 70–99)
Glucose-Capillary: 241 mg/dL — ABNORMAL HIGH (ref 70–99)

## 2020-02-21 LAB — HEMOGLOBIN A1C
Hgb A1c MFr Bld: 6.8 % — ABNORMAL HIGH (ref 4.8–5.6)
Mean Plasma Glucose: 148.46 mg/dL

## 2020-02-21 LAB — LACTIC ACID, PLASMA: Lactic Acid, Venous: 1.4 mmol/L (ref 0.5–1.9)

## 2020-02-21 LAB — TROPONIN I (HIGH SENSITIVITY)
Troponin I (High Sensitivity): 7 ng/L (ref <18)
Troponin I (High Sensitivity): 5 ng/L (ref <18)

## 2020-02-21 LAB — BRAIN NATRIURETIC PEPTIDE: B Natriuretic Peptide: 44.1 pg/mL (ref 0.0–100.0)

## 2020-02-21 MED ORDER — METHYLPREDNISOLONE SODIUM SUCC 125 MG IJ SOLR
80.0000 mg | Freq: Every day | INTRAMUSCULAR | Status: DC
  Administered 2020-02-22 – 2020-02-23 (×2): 80 mg via INTRAVENOUS
  Filled 2020-02-21 (×2): qty 2

## 2020-02-21 MED ORDER — LOSARTAN POTASSIUM 25 MG PO TABS
100.0000 mg | ORAL_TABLET | Freq: Every day | ORAL | Status: DC
  Administered 2020-02-22 – 2020-02-26 (×5): 100 mg via ORAL
  Filled 2020-02-21: qty 4
  Filled 2020-02-21: qty 2
  Filled 2020-02-21 (×2): qty 4
  Filled 2020-02-21 (×2): qty 2
  Filled 2020-02-21: qty 4
  Filled 2020-02-21 (×2): qty 2

## 2020-02-21 MED ORDER — ACETAMINOPHEN 650 MG RE SUPP: 650.0000 mg | Freq: Four times a day (QID) | RECTAL | Status: DC | PRN

## 2020-02-21 MED ORDER — FAMOTIDINE 20 MG PO TABS
20.0000 mg | ORAL_TABLET | Freq: Every day | ORAL | Status: DC
  Administered 2020-02-22 – 2020-02-26 (×5): 20 mg via ORAL
  Filled 2020-02-21 (×5): qty 1

## 2020-02-21 MED ORDER — PRAMIPEXOLE DIHYDROCHLORIDE 1 MG PO TABS
1.0000 mg | ORAL_TABLET | Freq: Every day | ORAL | Status: DC
  Administered 2020-02-21: 1.5 mg via ORAL
  Administered 2020-02-22 – 2020-02-23 (×2): 1 mg via ORAL
  Filled 2020-02-21 (×4): qty 2

## 2020-02-21 MED ORDER — MONTELUKAST SODIUM 10 MG PO TABS
10.0000 mg | ORAL_TABLET | Freq: Every day | ORAL | Status: DC
  Administered 2020-02-21 – 2020-02-25 (×5): 10 mg via ORAL
  Filled 2020-02-21 (×5): qty 1

## 2020-02-21 MED ORDER — INSULIN ASPART 100 UNIT/ML ~~LOC~~ SOLN
0.0000 [IU] | Freq: Three times a day (TID) | SUBCUTANEOUS | Status: DC
  Administered 2020-02-21: 8 [IU] via SUBCUTANEOUS
  Administered 2020-02-22: 5 [IU] via SUBCUTANEOUS
  Administered 2020-02-22: 2 [IU] via SUBCUTANEOUS
  Administered 2020-02-22: 8 [IU] via SUBCUTANEOUS
  Administered 2020-02-23: 5 [IU] via SUBCUTANEOUS
  Administered 2020-02-23: 2 [IU] via SUBCUTANEOUS
  Administered 2020-02-23: 3 [IU] via SUBCUTANEOUS
  Administered 2020-02-24: 2 [IU] via SUBCUTANEOUS
  Administered 2020-02-24 – 2020-02-25 (×2): 5 [IU] via SUBCUTANEOUS
  Administered 2020-02-25 – 2020-02-26 (×2): 2 [IU] via SUBCUTANEOUS
  Filled 2020-02-21 (×12): qty 1

## 2020-02-21 MED ORDER — LEVOTHYROXINE SODIUM 112 MCG PO TABS
112.0000 ug | ORAL_TABLET | Freq: Every day | ORAL | Status: DC
  Administered 2020-02-22 – 2020-02-25 (×4): 112 ug via ORAL
  Filled 2020-02-21 (×5): qty 1

## 2020-02-21 MED ORDER — ORAL CARE MOUTH RINSE
15.0000 mL | Freq: Two times a day (BID) | OROMUCOSAL | Status: DC
  Administered 2020-02-21 – 2020-02-26 (×9): 15 mL via OROMUCOSAL

## 2020-02-21 MED ORDER — SODIUM CHLORIDE 0.9 % IV SOLN
1.0000 g | INTRAVENOUS | Status: DC
  Administered 2020-02-21 – 2020-02-22 (×2): 1 g via INTRAVENOUS
  Filled 2020-02-21 (×2): qty 10
  Filled 2020-02-21: qty 1

## 2020-02-21 MED ORDER — PANTOPRAZOLE SODIUM 40 MG PO TBEC
40.0000 mg | DELAYED_RELEASE_TABLET | Freq: Two times a day (BID) | ORAL | Status: DC
  Administered 2020-02-22 – 2020-02-26 (×9): 40 mg via ORAL
  Filled 2020-02-21 (×9): qty 1

## 2020-02-21 MED ORDER — METFORMIN HCL 500 MG PO TABS
500.0000 mg | ORAL_TABLET | Freq: Two times a day (BID) | ORAL | Status: DC
  Administered 2020-02-21 – 2020-02-26 (×10): 500 mg via ORAL
  Filled 2020-02-21 (×11): qty 1

## 2020-02-21 MED ORDER — ACETAMINOPHEN 325 MG PO TABS
325.0000 mg | ORAL_TABLET | Freq: Four times a day (QID) | ORAL | Status: DC | PRN
  Administered 2020-02-22 – 2020-02-24 (×3): 325 mg via ORAL
  Filled 2020-02-21 (×3): qty 1

## 2020-02-21 MED ORDER — INSULIN ASPART 100 UNIT/ML ~~LOC~~ SOLN
0.0000 [IU] | Freq: Every day | SUBCUTANEOUS | Status: DC
  Administered 2020-02-21: 5 [IU] via SUBCUTANEOUS
  Filled 2020-02-21: qty 1

## 2020-02-21 MED ORDER — ENOXAPARIN SODIUM 40 MG/0.4ML ~~LOC~~ SOLN
40.0000 mg | SUBCUTANEOUS | Status: DC
  Administered 2020-02-21 – 2020-02-25 (×5): 40 mg via SUBCUTANEOUS
  Filled 2020-02-21 (×5): qty 0.4

## 2020-02-21 MED ORDER — SODIUM CHLORIDE 0.9 % IV BOLUS
1000.0000 mL | Freq: Once | INTRAVENOUS | Status: AC
  Administered 2020-02-21: 1000 mL via INTRAVENOUS

## 2020-02-21 MED ORDER — ATORVASTATIN CALCIUM 20 MG PO TABS
40.0000 mg | ORAL_TABLET | Freq: Every day | ORAL | Status: DC
  Administered 2020-02-21 – 2020-02-26 (×6): 40 mg via ORAL
  Filled 2020-02-21 (×6): qty 2

## 2020-02-21 MED ORDER — ALBUTEROL SULFATE (2.5 MG/3ML) 0.083% IN NEBU
10.0000 mg | INHALATION_SOLUTION | Freq: Once | RESPIRATORY_TRACT | Status: AC
  Administered 2020-02-21: 10 mg via RESPIRATORY_TRACT
  Filled 2020-02-21: qty 12

## 2020-02-21 MED ORDER — HYDRALAZINE HCL 25 MG PO TABS
25.0000 mg | ORAL_TABLET | Freq: Two times a day (BID) | ORAL | Status: DC
  Administered 2020-02-22 – 2020-02-26 (×9): 25 mg via ORAL
  Filled 2020-02-21 (×9): qty 1

## 2020-02-21 MED ORDER — ONDANSETRON HCL 4 MG/2ML IJ SOLN: 4.0000 mg | Freq: Four times a day (QID) | INTRAMUSCULAR | Status: DC | PRN

## 2020-02-21 MED ORDER — TIOTROPIUM BROMIDE MONOHYDRATE 18 MCG IN CAPS
18.0000 ug | ORAL_CAPSULE | Freq: Every day | RESPIRATORY_TRACT | Status: DC
  Filled 2020-02-21: qty 5

## 2020-02-21 MED ORDER — ALBUTEROL SULFATE HFA 108 (90 BASE) MCG/ACT IN AERS
2.0000 | INHALATION_SPRAY | RESPIRATORY_TRACT | Status: DC | PRN
  Administered 2020-02-22 (×2): 2 via RESPIRATORY_TRACT
  Filled 2020-02-21 (×2): qty 6.7

## 2020-02-21 MED ORDER — TRAZODONE HCL 50 MG PO TABS
150.0000 mg | ORAL_TABLET | Freq: Every day | ORAL | Status: DC
  Administered 2020-02-21 – 2020-02-25 (×5): 150 mg via ORAL
  Filled 2020-02-21 (×5): qty 3

## 2020-02-21 MED ORDER — GUAIFENESIN ER 600 MG PO TB12
600.0000 mg | ORAL_TABLET | Freq: Two times a day (BID) | ORAL | Status: DC
  Administered 2020-02-21 – 2020-02-23 (×5): 600 mg via ORAL
  Filled 2020-02-21 (×5): qty 1

## 2020-02-21 MED ORDER — ONDANSETRON HCL 4 MG PO TABS: 4.0000 mg | ORAL_TABLET | Freq: Four times a day (QID) | ORAL | Status: DC | PRN

## 2020-02-21 MED ORDER — AMLODIPINE BESYLATE 10 MG PO TABS
10.0000 mg | ORAL_TABLET | Freq: Every day | ORAL | Status: DC
  Administered 2020-02-21 – 2020-02-26 (×6): 10 mg via ORAL
  Filled 2020-02-21: qty 1
  Filled 2020-02-21: qty 2
  Filled 2020-02-21 (×4): qty 1

## 2020-02-21 MED ORDER — CLONAZEPAM 1 MG PO TABS
1.0000 mg | ORAL_TABLET | Freq: Every morning | ORAL | Status: DC
  Administered 2020-02-22 – 2020-02-26 (×5): 1 mg via ORAL
  Filled 2020-02-21 (×5): qty 1

## 2020-02-21 MED ORDER — METHYLPREDNISOLONE SODIUM SUCC 125 MG IJ SOLR: 60.0000 mg | Freq: Every day | INTRAMUSCULAR | Status: DC

## 2020-02-21 MED ORDER — CITALOPRAM HYDROBROMIDE 20 MG PO TABS
20.0000 mg | ORAL_TABLET | Freq: Every day | ORAL | Status: DC
  Administered 2020-02-22 – 2020-02-26 (×5): 20 mg via ORAL
  Filled 2020-02-21 (×5): qty 1

## 2020-02-21 MED ORDER — IPRATROPIUM-ALBUTEROL 0.5-2.5 (3) MG/3ML IN SOLN
3.0000 mL | Freq: Three times a day (TID) | RESPIRATORY_TRACT | Status: DC
  Administered 2020-02-21 – 2020-02-26 (×14): 3 mL via RESPIRATORY_TRACT
  Filled 2020-02-21 (×15): qty 3

## 2020-02-21 MED ORDER — FENOFIBRATE 160 MG PO TABS
160.0000 mg | ORAL_TABLET | Freq: Every day | ORAL | Status: DC
  Administered 2020-02-22 – 2020-02-26 (×5): 160 mg via ORAL
  Filled 2020-02-21 (×5): qty 1

## 2020-02-21 NOTE — ED Notes (Signed)
Pt taken off of Bipap

## 2020-02-21 NOTE — ED Notes (Signed)
Hospitalist at bedside 

## 2020-02-21 NOTE — ED Provider Notes (Signed)
[facility] Emergency Department Provider Note ____________________________________________   First MD Initiated Contact with Patient 02/21/20 1248     (approximate)  I have reviewed the triage vital signs and the nursing notes.  HISTORY  Chief Complaint Shortness of Breath (Pt from home, Ongoing sob since sunday ' was seen in our facilty sunday for the same . )   HPI Kristin [name] is a 74 y.o. femalewho presents to the ED for evaluation of shortness of breath.   Chart review indicates history of COPD, DM, HTN and CVA.  Seen in our ED about 36 hours ago for the same.  Apparently her grandchild was recently diagnosed with RSV.  Patient remains on 4 L of home O2.  While she was here 36 hours ago, she improved with breathing treatments and was discharged home with a prescription for DuoNeb and Keflex to treat a UTI.  Patient returns to the ED in obvious respiratory distress from home.  She has difficulty providing significant history due to her respiratory status and confusion.  She denies any falls or trauma.  She denies fevers, but indicates her breathing is bad.  EMS reports finding patient with sats in the 80s.  They provided IV magnesium, 2 breathing treatments, 125 mg of IV Solu-Medrol.   Past Medical History:  Diagnosis Date  . Asthma   . Cancer [facility]) 2010   Left ear cancer  . COPD (chronic obstructive pulmonary disease) ([place])   . COVID-19 05/27/2019  . Diabetes mellitus without complication ([place])   . Emphysema lung ([place])   . Hypertension   . Stroke [facility])     Patient Active Problem List   Diagnosis Date Noted  . RLQ abdominal pain 07/22/2019  . COPD exacerbation ([place]) 07/19/2019  . Acute respiratory failure due to COVID-19 ([place]) 05/30/2019  . Carotid stenosis 10/26/2017  . Diabetes ([place]) 10/26/2017  . Essential hypertension 10/26/2017  . COPD (chronic obstructive pulmonary disease) ([place]) 10/26/2017  . Pyelonephritis 09/02/2017  . UTI  (urinary tract infection) 11/30/2016  . Encephalopathy acute 11/30/2016  . Chronic respiratory failure with hypoxia ([place]) 11/30/2016  . Weakness generalized 11/30/2016  . Acute encephalopathy 11/30/2016  . Acute respiratory failure ([place]) 05/23/2016  . Malnutrition of moderate degree 05/23/2016  . Syncope 10/23/2015    Past Surgical History:  Procedure Laterality Date  . ABDOMINAL HYSTERECTOMY    . BREAST BIOPSY Left 1990   neg  . CHOLECYSTECTOMY    . COLONOSCOPY WITH PROPOFOL N/A 12/01/2017   Procedure: COLONOSCOPY WITH PROPOFOL;  Surgeon: Toledo, Benay Pike, MD;  Location: [facility] ENDOSCOPY;  Service: Gastroenterology;  Laterality: N/A;  . ESOPHAGOGASTRODUODENOSCOPY N/A 12/01/2017   Procedure: ESOPHAGOGASTRODUODENOSCOPY (EGD);  Surgeon: Toledo, Benay Pike, MD;  Location: [facility] ENDOSCOPY;  Service: Gastroenterology;  Laterality: N/A;  . NECK SURGERY      Prior to Admission medications   Medication Sig Start Date End Date Taking? Authorizing Provider  acetaminophen (TYLENOL) 325 MG tablet Take 2 tablets (650 mg total) by mouth every 6 (six) hours as needed for mild pain (or Fever >/= 101). 09/04/17   Gouru, Aruna, MD  amLODipine (NORVASC) 10 MG tablet Take 1 tablet (10 mg total) by mouth daily. 06/04/19   Ghimire, Henreitta Leber, MD  aspirin EC 81 MG EC tablet Take 1 tablet (81 mg total) by mouth daily. Patient taking differently: Take 162 mg by mouth daily.  10/24/15   Loletha Grayer, MD  atorvastatin (LIPITOR) 40 MG tablet Take 40 mg by mouth daily.  [provider]  cephALEXin (KEFLEX) 500 MG capsule Take 1 capsule (500 mg total) by mouth 2 (two) times daily for 7 days. 02/19/20 02/26/20  Triplett, Dessa Phi, FNP  clonazePAM (KLONOPIN) 1 MG tablet Take 1 mg by mouth in the morning.     [provider]  CRANBERRY EXTRACT PO Take 15,000 mg by mouth every morning.    [provider]  Cyanocobalamin (VITAMIN B-12) 5000 MCG SUBL Place 5,000 mcg under the tongue daily with  breakfast.     [provider]  famotidine (PEPCID) 20 MG tablet Take 1 tablet (20 mg total) by mouth daily. 06/04/19   Ghimire, Henreitta Leber, MD  fenofibrate 160 MG tablet Take 160 mg by mouth daily.  10/06/15   [provider]  fexofenadine (ALLEGRA) 180 MG tablet Take 180 mg by mouth daily.    [provider]  fluticasone (FLONASE) 50 MCG/ACT nasal spray Place 1-2 sprays into both nostrils in the morning.    [provider]  fluticasone-salmeterol (ADVAIR HFA) 230-21 MCG/ACT inhaler Inhale 2 puffs into the lungs 2 (two) times daily.    [provider]  hydrALAZINE (APRESOLINE) 50 MG tablet Take 0.5 tablets (25 mg total) by mouth 2 (two) times daily. 07/19/19   Enzo Bi, MD  ipratropium-albuterol (DUONEB) 0.5-2.5 (3) MG/3ML SOLN Take 3 mLs by nebulization in the morning and at bedtime. 02/19/20   Triplett, Johnette Abraham B, FNP  levothyroxine (SYNTHROID, LEVOTHROID) 112 MCG tablet Take 112 mcg by mouth daily before breakfast.  09/15/15   [provider]  losartan (COZAAR) 100 MG tablet Take 100 mg by mouth daily.    [provider]  Magnesium 250 MG TABS Take 250 mg by mouth daily.    [provider]  metFORMIN (GLUCOPHAGE) 500 MG tablet Take 500 mg by mouth 2 (two) times daily with a meal.     [provider]  montelukast (SINGULAIR) 10 MG tablet Take 10 mg by mouth at bedtime.  06/06/17   [provider]  OXYGEN Inhale 4 L/min into the lungs continuous.     [provider]  pantoprazole (PROTONIX) 40 MG tablet Take 1 tablet by mouth 2 (two) times daily with breakfast and lunch. 10/20/15   [provider]  polyethylene glycol powder (GLYCOLAX/MIRALAX) 17 GM/SCOOP powder Take 17 g by mouth daily as needed (for constipation- mixed into 4-8 ounces of desired beverage).  10/27/17   [provider]  Potassium 99 MG TABS Take 198 mg by mouth daily with breakfast.    [provider]  pramipexole  (MIRAPEX) 0.5 MG tablet Take 1-1.5 mg by mouth at bedtime.     [provider]  SPIRIVA HANDIHALER 18 MCG inhalation capsule Place 18 mcg into inhaler and inhale daily. 05/13/16   [provider]  traZODone (DESYREL) 150 MG tablet Take 150 mg by mouth at bedtime. 08/14/17   [provider]  VENTOLIN HFA 108 (90 Base) MCG/ACT inhaler Inhale 2 puffs into the lungs every 3 (three) hours as needed for wheezing or shortness of breath.  10/11/15   [provider]    Allergies Codeine, Morphine and related, and Prednisone  Family History  Problem Relation Age of Onset  . Ovarian cancer Mother   . Lung cancer Father   . Breast cancer Paternal Aunt     Social History Social History   Tobacco Use  . Smoking status: Former Research scientist (life sciences)  . Smokeless tobacco: Never Used  Vaping Use  . Vaping  Use: Never used  Substance Use Topics  . Alcohol use: No  . Drug use: No    Review of Systems  Unable to be accurately assessed due to patient's respiratory status and confusion  ____________________________________________   PHYSICAL EXAM:  VITAL SIGNS: Vitals:   02/21/20 1330 02/21/20 1345  BP: (!) 139/46   Pulse: 97 99  Resp: (!) 26 (!) 21  Temp:    SpO2: 95% 97%      Constitutional: Alert .  Sitting upright in bed and grunting with respirations.  Obviously tachypneic. Eyes: Conjunctivae are normal. PERRL. EOMI. Head: Atraumatic. Nose: No congestion/rhinnorhea. Mouth/Throat: Mucous membranes are moist.  Oropharynx non-erythematous. Neck: No stridor. No cervical spine tenderness to palpation. Cardiovascular: Tachycardic rate, regular rhythm. Grossly normal heart sounds.  Good peripheral circulation. Respiratory: Tachypneic to about 30, grunting with respirations.  Poor air movement throughout with diffuse expiratory wheezes.  Tripoding and obviously in distress Gastrointestinal: Soft , nondistended, nontender to palpation. No CVA  tenderness. Musculoskeletal: No lower extremity tenderness nor edema.  No joint effusions. No signs of acute trauma. Neurologic: No gross focal neurologic deficits are appreciated. No gait instability noted. Skin:  Skin is warm, dry and intact. No rash noted. Psychiatric: Mood and affect are normal. Speech and behavior are normal.  ____________________________________________   LABS (all labs ordered are listed, but only abnormal results are displayed)  Labs Reviewed  CBC WITH DIFFERENTIAL/PLATELET - Abnormal; Notable for the following components:      Result Value   WBC 11.4 (*)    RBC 3.60 (*)    Hemoglobin 11.0 (*)    HCT 34.5 (*)    Monocytes Absolute 1.1 (*)    Abs Immature Granulocytes 0.17 (*)    All other components within normal limits  COMPREHENSIVE METABOLIC PANEL - Abnormal; Notable for the following components:   Glucose, Bld 186 (*)    Creatinine, Ser 1.03 (*)    GFR, Estimated 57 (*)    All other components within normal limits  MAGNESIUM - Abnormal; Notable for the following components:   Magnesium 3.5 (*)    All other components within normal limits  BLOOD GAS, ARTERIAL - Abnormal; Notable for the following components:   pO2, Arterial 77 (*)    All other components within normal limits  RESP PANEL BY RT-PCR (FLU A&B, COVID) ARPGX2  BRAIN NATRIURETIC PEPTIDE  LACTIC ACID, PLASMA  TROPONIN I (HIGH SENSITIVITY)   ____________________________________________  12 Lead EKG  Sinus rhythm, rate of 104 bpm.  Normal axis.  Borderline prolonged QRS of 117 ms with evidence of an incomplete right bundle.  No evidence of acute ischemia. ____________________________________________  RADIOLOGY  ED MD interpretation: 1 view CXR reviewed by me without evidence of acute cardiopulmonary pathology.  Official radiology report(s): DG Chest Portable 1 View  Result Date: 02/21/2020 CLINICAL DATA:  COPD exacerbation on BiPAP. EXAM: PORTABLE CHEST 1 VIEW COMPARISON:   02/19/2020 FINDINGS: The cardiomediastinal silhouette is within normal limits. Aortic atherosclerosis is noted. Emphysema and mild bibasilar lung scarring are again noted. No acute airspace consolidation, edema, pleural effusion, or pneumothorax is identified. Prior anterior cervical fusion is noted. IMPRESSION: No active disease. Electronically Signed   By: Logan Bores M.D.   On: 02/21/2020 13:38    ____________________________________________   PROCEDURES and INTERVENTIONS  Procedure(s) performed (including Critical Care):  .1-3 Lead EKG Interpretation Performed by: Vladimir Crofts, MD Authorized by: Vladimir Crofts, MD     Interpretation: abnormal     ECG rate:  104  ECG rate assessment: tachycardic     Rhythm: sinus tachycardia     Ectopy: none     Conduction: normal   .Critical Care Performed by: Vladimir Crofts, MD Authorized by: Vladimir Crofts, MD   Critical care provider statement:    Critical care time (minutes):  30   Critical care was necessary to treat or prevent imminent or life-threatening deterioration of the following conditions:  Respiratory failure   Critical care was time spent personally by me on the following activities:  Discussions with consultants, evaluation of patient's response to treatment, examination of patient, ordering and performing treatments and interventions, ordering and review of laboratory studies, ordering and review of radiographic studies, pulse oximetry, re-evaluation of patient's condition, obtaining history from patient or surrogate and review of old charts    Medications  albuterol (PROVENTIL) (2.5 MG/3ML) 0.083% nebulizer solution 10 mg (10 mg Nebulization Given 02/21/20 1306)  sodium chloride 0.9 % bolus 1,000 mL (1,000 mLs Intravenous New Bag/Given 02/21/20 1400)    ____________________________________________   MDM / ED COURSE   61 old woman with history of COPD presents to the ED in respiratory distress requiring BiPAP and medical  admission.  Patient hypoxic to the 80s on room air on arrival, otherwise hemodynamically stable.  She is sitting up in bed, tripoding and grunting with very poor air movement throughout with diffuse expiratory wheezes.  Patient placed on BiPAP with continuous albuterol, and she turned around quite quickly with improved work of breathing and decreasing wheezes.  She was transitioned off of BiPAP to 5 L nasal cannula while in the ED.  She received steroids with EMS, and we provided additional albuterol as well as IV fluids due to her tachycardia.  She is still wheezing and tachypneic in the 20s, I think she would benefit from further treatments.  We will admit to hospitalist medicine for further work-up and management.  Clinical Course as of Feb 20 1413  Tue Feb 21, 2020  1313 Reassessed.  Being placed on BiPAP right now and RT is working on ABG   [DS]  1339 Reassessed and obtained additional history.  Patient reports that she felt nauseous last night and threw up one time.  Denies abdominal pain associated with this, and she denies any nausea or emesis today.  She reports she woke up this morning and "I just could not breathe."   [DS]  1409 At my request, RT removes BiPAP and places her on 5 L nasal cannula.   [DS]    Clinical Course User Index [DS] Vladimir Crofts, MD    ____________________________________________   FINAL CLINICAL IMPRESSION(S) / ED DIAGNOSES  Final diagnoses:  COPD exacerbation ([place])  Shortness of breath  Hypoxia     ED Discharge Orders    None       Rowland Ericsson Tamala Julian   Note:  This document was prepared using Dragon voice recognition software and may include unintentional dictation errors.   Vladimir Crofts, MD 02/21/20 [phone]

## 2020-02-21 NOTE — Progress Notes (Signed)
Pt taken off bipap and placed on 5lpm [place], sats 95%. Tolerating well at this time.

## 2020-02-21 NOTE — H&P (Signed)
History and Physical   Kristin [name] DGL:[id number] DOB: [birth year] DOA: 02/21/2020  PCP: Tracie Harrier, MD  Outpatient Specialists: [facility] - endocrinology, Dr. Lucilla Lame Patient coming from: home via EMS  I have personally briefly reviewed patient's old medical records in [place].  Chief Concern: shortness of breath  HPI: Kristin [name] is a 74 y.o. 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  She reports the shortness of breath since 02/17/20 and has worsened.  She presented to the emergency department and was given conservative treatments on 02/19/2020.  She was given duo nebs and Keflex 500 mg twice daily.  She was not given steroids due to history of diabetes mellitus.  She states she uses her nebulizer twice per day and tried her rescue inhaler without symptom improvement.  She denies sick contact. She feels she needs to cough up plegm but is unable to cough it up.  She endorsed a brief headache that is improved since being in the ED. She denies fever, chills, chest pain, abdominal pain, dysuria, hematuria.  She endorsed dysuria and urgency 3 days ago and was prescribed antibiotics on 02/19/2020.  She endorses diarrhea with loose stool twice.  She endorsed nausea and vomiting x 1 on 02/20/20.  She denies blood in stool.  She endorses some swelling in her lower extremities.   ED Course: Discussed with ED provider, requesting admission for COPD exacerbation failed outpatient conservative therapy.  Review of Systems: As per HPI otherwise 10 point review of systems negative.  Assessment/Plan  Principal Problem:   COPD exacerbation (HCC) Active Problems:   Acute respiratory failure (HCC)   UTI  (urinary tract infection)   Chronic respiratory failure with hypoxia (HCC)   Weakness generalized   Diabetes (HCC)   Essential hypertension   COPD - excabertaion - Improved after bipap and inhaler treatment per Ed provider - On 5 L [place] (baseline is 4 L [place]) - Duo neb tid while awalke - Chest physiotherapy every 8 hours - IS, Flutter valve every 1-2 hours during awake time - Guaifenesin 600 mg p.o. twice daily - CPAP qhs - solu-medrol 60 mg IV daily - SSI   Possible obstructive sleep apnea-CPAP nightly  Gastroenteritis -symptomatic management  Parkinson's with tremors-resumed home pramipexole nightly and clonazepam 1 mg every morning  Non-insulin-dependent diabetes mellitus-resumed home Metformin 500 mg twice daily -Insulin SSI in anticipation of hyperglycemia in setting of steroids for COPD exacerbation  Hypertension-controlled resumed home amlodipine 10 mg daily, hydralazine 25 mg twice daily, losartan 100 mg daily  Urinary tract infection-holding Keflex, ceftriaxone 1 g daily IV  Depression-resumed Celexa 20 mg daily  Insomnia-resume trazodone 150 mg nightly  acquired hypothyroid-resumed home levothyroxine 112 mcg daily  GERD-resumed home medications  Chart reviewed.   DVT prophylaxis: Enoxaparin Code Status: Full code Diet: Heart healthy diet Family Communication: Spouse is aware patient is here Disposition Plan: Pending clinical course Consults called: not indicated Admission status: observation   Past Medical History:  Diagnosis Date  . Asthma   . 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 COPD (chronic obstructive pulmonary disease) ([place])   . COVID-19 05/27/2019  . Diabetes mellitus without complication ([place])   . Emphysema lung ([place])   . Hypertension   . Stroke [facility])    Past Surgical  History:  Procedure Laterality Date  . ABDOMINAL HYSTERECTOMY    . BREAST BIOPSY Left 1990   neg  . CHOLECYSTECTOMY    . COLONOSCOPY WITH PROPOFOL N/A 12/01/2017    Procedure: COLONOSCOPY WITH PROPOFOL;  Surgeon: Toledo, Benay Pike, MD;  Location: [facility] ENDOSCOPY;  Service: Gastroenterology;  Laterality: N/A;  . ESOPHAGOGASTRODUODENOSCOPY N/A 12/01/2017   Procedure: ESOPHAGOGASTRODUODENOSCOPY (EGD);  Surgeon: Toledo, Benay Pike, MD;  Location: [facility] ENDOSCOPY;  Service: Gastroenterology;  Laterality: N/A;  . NECK SURGERY     Social History:  reports that she has quit smoking. She has never used smokeless tobacco. She reports that she does not drink alcohol and does not use drugs.  Allergies  Allergen Reactions  . Codeine Nausea Only    Can take along with medication for nausea  . Morphine And Related Nausea Only    Can take along with medication for nausea  . Prednisone Other (See Comments)    Elevated BGL into the 300's   Family History  Problem Relation Age of Onset  . Ovarian cancer Mother   . Lung cancer Father   . Breast cancer Paternal Aunt    Family history: Family history reviewed and not pertinent  Prior to Admission medications   Medication Sig Start Date End Date Taking? Authorizing Provider  acetaminophen (TYLENOL) 325 MG tablet Take 2 tablets (650 mg total) by mouth every 6 (six) hours as needed for mild pain (or Fever >/= 101). 09/04/17   Gouru, Aruna, MD  amLODipine (NORVASC) 10 MG tablet Take 1 tablet (10 mg total) by mouth daily. 06/04/19   Ghimire, Henreitta Leber, MD  aspirin EC 81 MG EC tablet Take 1 tablet (81 mg total) by mouth daily. Patient taking differently: Take 162 mg by mouth daily.  10/24/15   Loletha Grayer, MD  atorvastatin (LIPITOR) 40 MG tablet Take 40 mg by mouth daily.     [provider]  cephALEXin (KEFLEX) 500 MG capsule Take 1 capsule (500 mg total) by mouth 2 (two) times daily for 7 days. 02/19/20 02/26/20  Triplett, Dessa Phi, FNP  clonazePAM (KLONOPIN) 1 MG tablet Take 1 mg by mouth in the morning.     [provider]  CRANBERRY EXTRACT PO Take 15,000 mg by mouth every morning.    [provider]  Cyanocobalamin (VITAMIN B-12) 5000 MCG SUBL Place 5,000 mcg under the tongue daily with breakfast.     [provider]  famotidine (PEPCID) 20 MG tablet Take 1 tablet (20 mg total) by mouth daily. 06/04/19   Ghimire, Henreitta Leber, MD  fenofibrate 160 MG tablet Take 160 mg by mouth daily.  10/06/15   [provider]  fexofenadine (ALLEGRA) 180 MG tablet Take 180 mg by mouth daily.    [provider]  fluticasone (FLONASE) 50 MCG/ACT nasal spray Place 1-2 sprays into both nostrils in the morning.    [provider]  fluticasone-salmeterol (ADVAIR HFA) 230-21 MCG/ACT inhaler Inhale 2 puffs into the lungs 2 (two) times daily.    [provider]  hydrALAZINE (APRESOLINE) 50 MG tablet Take 0.5 tablets (25 mg total) by mouth 2 (two) times daily. 07/19/19   Enzo Bi, MD  ipratropium-albuterol (DUONEB) 0.5-2.5 (3) MG/3ML SOLN Take 3 mLs by nebulization in the morning and at bedtime. 02/19/20   Triplett, Johnette Abraham B, FNP  levothyroxine (SYNTHROID, LEVOTHROID) 112 MCG tablet Take 112 mcg by mouth daily before breakfast.  09/15/15   [provider]  losartan (COZAAR) 100 MG tablet Take  100 mg by mouth daily.    [provider]  Magnesium 250 MG TABS Take 250 mg by mouth daily.    [provider]  metFORMIN (GLUCOPHAGE) 500 MG tablet Take 500 mg by mouth 2 (two) times daily with a meal.     [provider]  montelukast (SINGULAIR) 10 MG tablet Take 10 mg by mouth at bedtime.  06/06/17   [provider]  OXYGEN Inhale 4 L/min into the lungs continuous.     [provider]  pantoprazole (PROTONIX) 40 MG tablet Take 1 tablet by mouth 2 (two) times daily with breakfast and lunch. 10/20/15   [provider]  polyethylene glycol powder (GLYCOLAX/MIRALAX) 17 GM/SCOOP powder Take 17 g by mouth daily as needed (for constipation- mixed into 4-8 ounces of desired beverage).  10/27/17   [provider]  Potassium 99 MG  TABS Take 198 mg by mouth daily with breakfast.    [provider]  pramipexole (MIRAPEX) 0.5 MG tablet Take 1-1.5 mg by mouth at bedtime.     [provider]  SPIRIVA HANDIHALER 18 MCG inhalation capsule Place 18 mcg into inhaler and inhale daily. 05/13/16   [provider]  traZODone (DESYREL) 150 MG tablet Take 150 mg by mouth at bedtime. 08/14/17   [provider]  VENTOLIN HFA 108 (90 Base) MCG/ACT inhaler Inhale 2 puffs into the lungs every 3 (three) hours as needed for wheezing or shortness of breath.  10/11/15   [provider]   Physical Exam: Vitals:   02/21/20 1539 02/21/20 1600 02/21/20 1630 02/21/20 1700  BP: (!) 127/48 (!) 149/54 (!) 133/53 (!) 154/44  Pulse: 93 91 95 97  Resp: (!) 26 (!) 27 20 (!) 29  Temp:      TempSrc:      SpO2: 98% 99% 96% 98%  Weight:      Height:       Constitutional: appears older than chronological age, NAD, calm, comfortable Eyes: PERRL, lids and conjunctivae normal ENMT: Mucous membranes are moist. Posterior pharynx clear of any exudate or lesions. Age-appropriate dentition. Hearing appropriate Neck: normal, supple, no masses, no thyromegaly Respiratory: clear to auscultation bilaterally, no wheezing, no crackles. Normal respiratory effort. No accessory muscle use.  Cardiovascular: Regular rate and rhythm, no murmurs / rubs / gallops. No extremity edema. 2+ pedal pulses. No carotid bruits.  Abdomen: no tenderness, no masses palpated, no hepatosplenomegaly. Bowel sounds positive.  Musculoskeletal: no clubbing / cyanosis. No joint deformity upper and lower extremities. Good ROM, no contractures, no atrophy. Normal muscle tone.  Baseline tremors Skin: no rashes, lesions, ulcers. No induration Neurologic: Sensation intact. Strength 5/5 in all 4.  Psychiatric: Normal judgment and insight. Alert and oriented x 3. Normal mood.   EKG: Independently reviewed, showing sinus tachycardia, with rate of 104, QTc 483  generally unchanged from previous EKG on 02/19/2020.  Chest x-ray on Admission: Personally reviewed and I agree with radiologist reading as below.  DG Chest 2 View  Result Date: 02/19/2020 CLINICAL DATA:  Short of breath.  COPD. EXAM: CHEST - 2 VIEW COMPARISON:  07/19/2019 FINDINGS: Cardiac silhouette is normal in size. No mediastinal or hilar masses or evidence of adenopathy. Lungs are hyperexpanded. Linear stable scarring noted at the lung bases. Lungs otherwise clear. No pleural effusion or pneumothorax. Skeletal structures are demineralized, but intact. Stable changes from a previous anterior cervical spine fusion. IMPRESSION: 1. No acute cardiopulmonary disease. 2. COPD.  Chronic lung base scarring. Electronically Signed  By: Lajean Manes M.D.   On: 02/19/2020 18:09   DG Chest Portable 1 View  Result Date: 02/21/2020 CLINICAL DATA:  COPD exacerbation on BiPAP. EXAM: PORTABLE CHEST 1 VIEW COMPARISON:  02/19/2020 FINDINGS: The cardiomediastinal silhouette is within normal limits. Aortic atherosclerosis is noted. Emphysema and mild bibasilar lung scarring are again noted. No acute airspace consolidation, edema, pleural effusion, or pneumothorax is identified. Prior anterior cervical fusion is noted. IMPRESSION: No active disease. Electronically Signed   By: Logan Bores M.D.   On: 02/21/2020 13:38   Labs on Admission: I have personally reviewed following labs  CBC: Recent Labs  Lab 02/19/20 1718 02/21/20 1257  WBC 7.3 11.4*  NEUTROABS  --  6.6  HGB 11.7* 11.0*  HCT 35.4* 34.5*  MCV 95.4 95.8  PLT 205 471   Basic Metabolic Panel: Recent Labs  Lab 02/19/20 1718 02/21/20 1257  NA 137 137  K 4.5 3.9  CL 102 103  CO2 27 23  GLUCOSE 139* 186*  BUN 24* 22  CREATININE 1.06* 1.03*  CALCIUM 10.0 9.2  MG  --  3.5*   GFR: Estimated Creatinine Clearance: 41.4 mL/min (A) (by C-G formula based on SCr of 1.03 mg/dL (H)). Liver Function Tests: Recent Labs  Lab 02/21/20 1257  AST 16   ALT 16  ALKPHOS 39  BILITOT 0.8  PROT 6.7  ALBUMIN 4.3   Urine analysis:    Component Value Date/Time   COLORURINE YELLOW (A) 02/19/2020 2000   APPEARANCEUR HAZY (A) 02/19/2020 2000   APPEARANCEUR Clear 03/18/2013 1319   LABSPEC 1.016 02/19/2020 2000   LABSPEC 1.005 03/18/2013 1319   PHURINE 6.0 02/19/2020 2000   GLUCOSEU NEGATIVE 02/19/2020 2000   GLUCOSEU Negative 03/18/2013 1319   HGBUR NEGATIVE 02/19/2020 2000   BILIRUBINUR NEGATIVE 02/19/2020 2000   BILIRUBINUR Negative 03/18/2013 1319   KETONESUR NEGATIVE 02/19/2020 2000   PROTEINUR NEGATIVE 02/19/2020 2000   UROBILINOGEN 1.0 06/24/2008 1545   NITRITE NEGATIVE 02/19/2020 2000   LEUKOCYTESUR SMALL (A) 02/19/2020 2000   LEUKOCYTESUR 1+ 03/18/2013 1319   Markeda Narvaez N Angelyse Heslin D.O. [facility]  If 12AM-7AM, please contact overnight-coverage provider If 7AM-7PM, please contact day coverage provider [email or web address]  02/21/2020, 5:33 PM

## 2020-02-21 NOTE — ED Notes (Signed)
Patient cleaned and placed back on the purewick.

## 2020-02-21 NOTE — ED Notes (Signed)
Pt from home , C/C Sob. Ongoing since Sunday. Pt was seen here for the same and placed on Bipap.   EMS gave  1 Alb, 3 x Doul Nebs ,  125 solumedrol , 2mg  Mag

## 2020-02-21 NOTE — ED Triage Notes (Signed)
Pt from home. C/C SOB since Sunday . Was seen in our facility for the same, pt was placed on Bipap when she was seen.

## 2020-02-21 NOTE — ED Notes (Signed)
Patient eating dinner tray at this time. 

## 2020-02-21 NOTE — ED Notes (Signed)
Respiratory at bedside.

## 2020-02-21 NOTE — ED Notes (Signed)
Reparatory called for abg and bipap

## 2020-02-21 NOTE — ED Notes (Signed)
Called respiratory to have bipap removed per MD

## 2020-02-22 ENCOUNTER — Encounter: Payer: Self-pay | Admitting: Internal Medicine

## 2020-02-22 DIAGNOSIS — K219 Gastro-esophageal reflux disease without esophagitis: Secondary | ICD-10-CM | POA: Diagnosis present

## 2020-02-22 DIAGNOSIS — R519 Headache, unspecified: Secondary | ICD-10-CM | POA: Diagnosis present

## 2020-02-22 DIAGNOSIS — K529 Noninfective gastroenteritis and colitis, unspecified: Secondary | ICD-10-CM | POA: Diagnosis present

## 2020-02-22 DIAGNOSIS — Z7984 Long term (current) use of oral hypoglycemic drugs: Secondary | ICD-10-CM | POA: Diagnosis not present

## 2020-02-22 DIAGNOSIS — J441 Chronic obstructive pulmonary disease with (acute) exacerbation: Secondary | ICD-10-CM | POA: Diagnosis present

## 2020-02-22 DIAGNOSIS — Z7951 Long term (current) use of inhaled steroids: Secondary | ICD-10-CM | POA: Diagnosis not present

## 2020-02-22 DIAGNOSIS — Z9049 Acquired absence of other specified parts of digestive tract: Secondary | ICD-10-CM | POA: Diagnosis not present

## 2020-02-22 DIAGNOSIS — R0602 Shortness of breath: Secondary | ICD-10-CM | POA: Diagnosis present

## 2020-02-22 DIAGNOSIS — Z79899 Other long term (current) drug therapy: Secondary | ICD-10-CM | POA: Diagnosis not present

## 2020-02-22 DIAGNOSIS — Z801 Family history of malignant neoplasm of trachea, bronchus and lung: Secondary | ICD-10-CM | POA: Diagnosis not present

## 2020-02-22 DIAGNOSIS — Z803 Family history of malignant neoplasm of breast: Secondary | ICD-10-CM | POA: Diagnosis not present

## 2020-02-22 DIAGNOSIS — Z87891 Personal history of nicotine dependence: Secondary | ICD-10-CM | POA: Diagnosis not present

## 2020-02-22 DIAGNOSIS — J9621 Acute and chronic respiratory failure with hypoxia: Secondary | ICD-10-CM | POA: Diagnosis present

## 2020-02-22 DIAGNOSIS — Z8673 Personal history of transient ischemic attack (TIA), and cerebral infarction without residual deficits: Secondary | ICD-10-CM | POA: Diagnosis not present

## 2020-02-22 DIAGNOSIS — N390 Urinary tract infection, site not specified: Secondary | ICD-10-CM | POA: Diagnosis present

## 2020-02-22 DIAGNOSIS — Z90710 Acquired absence of both cervix and uterus: Secondary | ICD-10-CM | POA: Diagnosis not present

## 2020-02-22 DIAGNOSIS — Z79890 Hormone replacement therapy (postmenopausal): Secondary | ICD-10-CM | POA: Diagnosis not present

## 2020-02-22 DIAGNOSIS — I10 Essential (primary) hypertension: Secondary | ICD-10-CM | POA: Diagnosis present

## 2020-02-22 DIAGNOSIS — Z20822 Contact with and (suspected) exposure to covid-19: Secondary | ICD-10-CM | POA: Diagnosis present

## 2020-02-22 DIAGNOSIS — E039 Hypothyroidism, unspecified: Secondary | ICD-10-CM | POA: Diagnosis present

## 2020-02-22 DIAGNOSIS — Z7982 Long term (current) use of aspirin: Secondary | ICD-10-CM | POA: Diagnosis not present

## 2020-02-22 DIAGNOSIS — G20 Parkinson's disease: Secondary | ICD-10-CM | POA: Diagnosis present

## 2020-02-22 DIAGNOSIS — Z888 Allergy status to other drugs, medicaments and biological substances status: Secondary | ICD-10-CM | POA: Diagnosis not present

## 2020-02-22 DIAGNOSIS — E119 Type 2 diabetes mellitus without complications: Secondary | ICD-10-CM | POA: Diagnosis present

## 2020-02-22 DIAGNOSIS — Z885 Allergy status to narcotic agent status: Secondary | ICD-10-CM | POA: Diagnosis not present

## 2020-02-22 LAB — BASIC METABOLIC PANEL
Anion gap: 7 (ref 5–15)
BUN: 21 mg/dL (ref 8–23)
CO2: 27 mmol/L (ref 22–32)
Calcium: 8.7 mg/dL — ABNORMAL LOW (ref 8.9–10.3)
Chloride: 106 mmol/L (ref 98–111)
Creatinine, Ser: 0.85 mg/dL (ref 0.44–1.00)
GFR, Estimated: >60 mL/min (ref >60)
Glucose, Bld: 132 mg/dL — ABNORMAL HIGH (ref 70–99)
Potassium: 4.2 mmol/L (ref 3.5–5.1)
Sodium: 140 mmol/L (ref 135–145)

## 2020-02-22 LAB — CBC
HCT: 31.4 % — ABNORMAL LOW (ref 36.0–46.0)
Hemoglobin: 10.2 g/dL — ABNORMAL LOW (ref 12.0–15.0)
MCH: 31.3 pg (ref 26.0–34.0)
MCHC: 32.5 g/dL (ref 30.0–36.0)
MCV: 96.3 fL (ref 80.0–100.0)
Platelets: 204 10*3/uL (ref 150–400)
RBC: 3.26 MIL/uL — ABNORMAL LOW (ref 3.87–5.11)
RDW: 13.7 % (ref 11.5–15.5)
WBC: 7.5 10*3/uL (ref 4.0–10.5)
nRBC: 0 % (ref 0.0–0.2)

## 2020-02-22 LAB — GLUCOSE, CAPILLARY
Glucose-Capillary: 122 mg/dL — ABNORMAL HIGH (ref 70–99)
Glucose-Capillary: 257 mg/dL — ABNORMAL HIGH (ref 70–99)
Glucose-Capillary: 215 mg/dL — ABNORMAL HIGH (ref 70–99)
Glucose-Capillary: 161 mg/dL — ABNORMAL HIGH (ref 70–99)

## 2020-02-22 MED ORDER — TIOTROPIUM BROMIDE MONOHYDRATE 18 MCG IN CAPS
18.0000 ug | ORAL_CAPSULE | Freq: Every day | RESPIRATORY_TRACT | Status: DC
  Filled 2020-02-22: qty 5

## 2020-02-22 MED ORDER — SODIUM CHLORIDE 0.9 % IV SOLN: INTRAVENOUS | Status: DC | PRN

## 2020-02-22 MED ORDER — LOPERAMIDE HCL 2 MG PO CAPS
2.0000 mg | ORAL_CAPSULE | ORAL | Status: DC | PRN
  Administered 2020-02-23 – 2020-02-26 (×2): 2 mg via ORAL
  Filled 2020-02-22 (×2): qty 1

## 2020-02-22 NOTE — Progress Notes (Signed)
PROGRESS NOTE  Kristin [name] NID:[id number] DOB: 11-Aug-1945 DOA: 02/21/2020 PCP: Tracie Harrier, MD  HPI/Recap of past 24 hours: Kristin [name] is a 74 y.o. 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  She reports the shortness of breath since 02/17/20 and has worsened.  She presented to the emergency department and was given conservative treatments on 02/19/2020.  She was given duo nebs and Keflex 500 mg twice daily.  She was not given steroids due to history of diabetes mellitus.  She states she uses her nebulizer twice per day and tried her rescue inhaler without symptom improvement.  She denies sick contact. She feels she needs to cough up plegm but is unable to cough it up.  She endorsed a brief headache that is improved since being in the ED. She denies fever, chills, chest pain, abdominal pain, dysuria, hematuria.  She endorsed dysuria and urgency 3 days ago and was prescribed antibiotics on 02/19/2020.  She endorses diarrhea with loose stool twice.  She endorsed nausea and vomiting x 1 on 02/20/20.  She denies blood in stool.  She endorses some swelling in her lower extremities.   ED Course: Discussed with ED provider, requesting admission for COPD exacerbation failed outpatient conservative therapy.  02/22/20: Reports a productive cough and a runny nose.  No chest pain.  Assessment/Plan: Principal Problem:   COPD exacerbation (HCC) Active Problems:   Acute respiratory failure (HCC)   UTI (urinary tract infection)   Chronic respiratory failure with hypoxia (HCC)   Weakness generalized   Diabetes (HCC)   Essential hypertension   Acute exacerbation of chronic obstructive pulmonary disease (COPD)  (HCC)  Acute COPD exacerbation Presented with worsening dyspnea No evidence of active disease on chest x-ray Admitted for acute COPD exacerbation, started on IV Solu-Medrol, continued Continue bronchodilators Maintain O2 saturation greater than 90%  Acute on chronic hypoxic respiratory failure She is on 4 L nasal cannula at baseline continuously Currently back to her baseline, was previously on 5 L nasal cannula, on BiPAP at presentation Continue to monitor  Presumed UTI UA positive for pyuria on 02/19/2020 No urine culture or blood cultures collected Treated empirically with Rocephin, continue Afebrile with no leukocytosis  Intermittent loose stools Chronic per the patient Afebrile with no leukocytosis Imodium as needed  Chronic anxiety/depression Resume home regimen.  Type 2 diabetes with hyperglycemia Obtain hemoglobin A1c Continue insulin sliding scale  Chronic normocytic anemia Baseline hemoglobin appears to be 11 Hemoglobin 10.2 No overt bleeding Continue to monitor H&H  Chronic diastolic CHF Last 2D echo done on 05/24/2016 showed normal LVEF Euvolemic on exam Continue current medication Strict I's and O's and daily weight  GERD Resume home regimen she is on PPI twice daily  Hyperlipidemia Resume home regimen   Code Status: Full code  Family Communication: None at bedside  Disposition Plan: Likely discharge to home in 02/23/2020   Consultants: None Procedures:  None  Antimicrobials:  Rocephin  DVT prophylaxis: Subcu Lovenox daily  Status is: Inpatient    Dispo:  Patient From: Home  Planned Disposition: Home  Expected discharge date: 02/23/20  Medically stable for discharge: No, ongoing management of COPD exacerbation, presumed UTI.         Objective: Vitals:   02/22/20 0435 02/22/20 0742 02/22/20 1107 02/22/20 1558  BP: Marland Kitchen)  124/46 115/87 (!) 137/54 (!) 141/58  Pulse: 65 68 83 79  Resp: 20 14 16 16   Temp: 98.5 F (36.9  C) 98.2 F (36.8 C) 98.1 F (36.7 C) 98.8 F (37.1 C)  TempSrc: Oral Oral Oral   SpO2: 100% 100% 96% 97%  Weight:      Height:        Intake/Output Summary (Last 24 hours) at 02/22/2020 1652 Last data filed at 02/22/2020 1143 Gross per 24 hour  Intake 335.95 ml  Output 1201 ml  Net -865.05 ml   Filed Weights   02/21/20 1312  Weight: 59 kg    Exam:  . General: 74 y.o. year-old female well developed well nourished in no acute distress.  Alert and oriented x3. . Cardiovascular: Regular rate and rhythm with no rubs or gallops.  No thyromegaly or JVD noted.   Marland Kitchen Respiratory: Mild rales at bases bilaterally.  No wheezing noted.  Poor respiratory effort.   . Abdomen: Soft nontender nondistended with normal bowel sounds x4 quadrants. . Musculoskeletal: No lower extremity edema bilaterally.   Marland Kitchen Psychiatry: Mood is appropriate for condition and setting   Data Reviewed: CBC: Recent Labs  Lab 02/19/20 1718 02/21/20 1257 02/22/20 0359  WBC 7.3 11.4* 7.5  NEUTROABS  --  6.6  --   HGB 11.7* 11.0* 10.2*  HCT 35.4* 34.5* 31.4*  MCV 95.4 95.8 96.3  PLT 205 202 672   Basic Metabolic Panel: Recent Labs  Lab 02/19/20 1718 02/21/20 1257 02/22/20 0359  NA 137 137 140  K 4.5 3.9 4.2  CL 102 103 106  CO2 27 23 27   GLUCOSE 139* 186* 132*  BUN 24* 22 21  CREATININE 1.06* 1.03* 0.85  CALCIUM 10.0 9.2 8.7*  MG  --  3.5*  --    GFR: Estimated Creatinine Clearance: 50.1 mL/min (by C-G formula based on SCr of 0.85 mg/dL). Liver Function Tests: Recent Labs  Lab 02/21/20 1257  AST 16  ALT 16  ALKPHOS 39  BILITOT 0.8  PROT 6.7  ALBUMIN 4.3   No results for input(s): LIPASE, AMYLASE in the last 168 hours. No results for input(s): AMMONIA in the last 168 hours. Coagulation Profile: No results for input(s): INR, PROTIME in the last 168 hours. Cardiac Enzymes: No results for input(s): CKTOTAL, CKMB, CKMBINDEX, TROPONINI in the last 168 hours. BNP (last 3 results) No results  for input(s): PROBNP in the last 8760 hours. HbA1C: Recent Labs    02/21/20 1451  HGBA1C 6.8*   CBG: Recent Labs  Lab 02/21/20 1710 02/21/20 2201 02/22/20 0735 02/22/20 1105  GLUCAP 282* 241* 122* 257*   Lipid Profile: No results for input(s): CHOL, HDL, LDLCALC, TRIG, CHOLHDL, LDLDIRECT in the last 72 hours. Thyroid Function Tests: Recent Labs    02/21/20 1451  TSH 4.716*   Anemia Panel: No results for input(s): VITAMINB12, FOLATE, FERRITIN, TIBC, IRON, RETICCTPCT in the last 72 hours. Urine analysis:    Component Value Date/Time   COLORURINE YELLOW (A) 02/19/2020 2000   APPEARANCEUR HAZY (A) 02/19/2020 2000   APPEARANCEUR Clear 03/18/2013 1319   LABSPEC 1.016 02/19/2020 2000   LABSPEC 1.005 03/18/2013 1319   PHURINE 6.0 02/19/2020 2000   GLUCOSEU NEGATIVE 02/19/2020 2000   GLUCOSEU Negative 03/18/2013 1319   HGBUR NEGATIVE 02/19/2020 2000   BILIRUBINUR NEGATIVE 02/19/2020 2000   BILIRUBINUR Negative 03/18/2013 1319   KETONESUR NEGATIVE 02/19/2020 2000   PROTEINUR NEGATIVE 02/19/2020 2000   UROBILINOGEN 1.0 06/24/2008 1545   NITRITE NEGATIVE 02/19/2020  [place] (A) 02/19/2020 2000   LEUKOCYTESUR 1+ 03/18/2013 1319   Sepsis Labs: @LABRCNTIP (procalcitonin:4,lacticidven:4)  ) Recent Results (from the past 240 hour(s))  Resp Panel by RT-PCR (Flu A&B, Covid) Nasopharyngeal Swab     Status: None   Collection Time: 02/19/20  7:18 PM   Specimen: Nasopharyngeal Swab; Nasopharyngeal(NP) swabs in vial transport medium  Result Value Ref Range Status   SARS Coronavirus 2 by RT PCR NEGATIVE NEGATIVE Final    Comment: (NOTE) SARS-CoV-2 target nucleic acids are NOT DETECTED.  The SARS-CoV-2 RNA is generally detectable in upper respiratory specimens during the acute phase of infection. The lowest concentration of SARS-CoV-2 viral copies this assay can detect is 138 copies/mL. A negative result does not preclude SARS-Cov-2 infection and should not be  used as the sole basis for treatment or other patient management decisions. A negative result may occur with  improper specimen collection/handling, submission of specimen other than nasopharyngeal swab, presence of viral mutation(s) within the areas targeted by this assay, and inadequate number of viral copies(<138 copies/mL). A negative result must be combined with clinical observations, patient history, and epidemiological information. The expected result is Negative.  Fact Sheet for Patients:  EntrepreneurPulse.com.au  Fact Sheet for Healthcare Providers:  IncredibleEmployment.be  This test is no t yet approved or cleared by the [place] FDA and  has been authorized for detection and/or diagnosis of SARS-CoV-2 by FDA under an Emergency Use Authorization (EUA). This EUA will remain  in effect (meaning this test can be used) for the duration of the COVID-19 declaration under Section 564(b)(1) of the Act, 21 U.S.C.section 360bbb-3(b)(1), unless the authorization is terminated  or revoked sooner.       Influenza A by PCR NEGATIVE NEGATIVE Final   Influenza B by PCR NEGATIVE NEGATIVE Final    Comment: (NOTE) The Xpert Xpress SARS-CoV-2/FLU/RSV plus assay is intended as an aid in the diagnosis of influenza from Nasopharyngeal swab specimens and should not be used as a sole basis for treatment. Nasal washings and aspirates are unacceptable for Xpert Xpress SARS-CoV-2/FLU/RSV testing.  Fact Sheet for Patients: EntrepreneurPulse.com.au  Fact Sheet for Healthcare Providers: IncredibleEmployment.be  This test is not yet approved or cleared by the [place] FDA and has been authorized for detection and/or diagnosis of SARS-CoV-2 by FDA under an Emergency Use Authorization (EUA). This EUA will remain in effect (meaning this test can be used) for the duration of the COVID-19 declaration under Section  564(b)(1) of the Act, 21 U.S.C. section 360bbb-3(b)(1), unless the authorization is terminated or revoked.  Performed at [facility], [address] [place], [place] [address]   Resp Panel by RT-PCR (Flu A&B, Covid) Nasopharyngeal Swab     Status: None   Collection Time: 02/21/20 12:56 PM   Specimen: Nasopharyngeal Swab; Nasopharyngeal(NP) swabs in vial transport medium  Result Value Ref Range Status   SARS Coronavirus 2 by RT PCR NEGATIVE NEGATIVE Final    Comment: (NOTE) SARS-CoV-2 target nucleic acids are NOT DETECTED.  The SARS-CoV-2 RNA is generally detectable in upper respiratory specimens during the acute phase of infection. The lowest concentration of SARS-CoV-2 viral copies this assay can detect is 138 copies/mL. A negative result does not preclude SARS-Cov-2 infection and should not be used as the sole basis for treatment or other patient management decisions. A negative result may occur with  improper specimen collection/handling, submission of specimen other than nasopharyngeal swab, presence of viral mutation(s) within the areas targeted by this assay,  and inadequate number of viral copies(<138 copies/mL). A negative result must be combined with clinical observations, patient history, and epidemiological information. The expected result is Negative.  Fact Sheet for Patients:  EntrepreneurPulse.com.au  Fact Sheet for Healthcare Providers:  IncredibleEmployment.be  This test is no t yet approved or cleared by the [place] FDA and  has been authorized for detection and/or diagnosis of SARS-CoV-2 by FDA under an Emergency Use Authorization (EUA). This EUA will remain  in effect (meaning this test can be used) for the duration of the COVID-19 declaration under Section 564(b)(1) of the Act, 21 U.S.C.section 360bbb-3(b)(1), unless the authorization is terminated  or revoked sooner.       Influenza A by PCR NEGATIVE  NEGATIVE Final   Influenza B by PCR NEGATIVE NEGATIVE Final    Comment: (NOTE) The Xpert Xpress SARS-CoV-2/FLU/RSV plus assay is intended as an aid in the diagnosis of influenza from Nasopharyngeal swab specimens and should not be used as a sole basis for treatment. Nasal washings and aspirates are unacceptable for Xpert Xpress SARS-CoV-2/FLU/RSV testing.  Fact Sheet for Patients: EntrepreneurPulse.com.au  Fact Sheet for Healthcare Providers: IncredibleEmployment.be  This test is not yet approved or cleared by the [place] FDA and has been authorized for detection and/or diagnosis of SARS-CoV-2 by FDA under an Emergency Use Authorization (EUA). This EUA will remain in effect (meaning this test can be used) for the duration of the COVID-19 declaration under Section 564(b)(1) of the Act, 21 U.S.C. section 360bbb-3(b)(1), unless the authorization is terminated or revoked.  Performed at [facility], [address] [place], [place] [address]       Studies: No results found.  Scheduled Meds: . amLODipine  10 mg Oral Daily  . atorvastatin  40 mg Oral Daily  . citalopram  20 mg Oral Daily  . clonazePAM  1 mg Oral q AM  . enoxaparin (LOVENOX) injection  40 mg Subcutaneous Q24H  . famotidine  20 mg Oral Daily  . fenofibrate  160 mg Oral Daily  . guaiFENesin  600 mg Oral BID  . hydrALAZINE  25 mg Oral BID  . insulin aspart  0-15 Units Subcutaneous TID WC  . insulin aspart  0-5 Units Subcutaneous QHS  . ipratropium-albuterol  3 mL Nebulization TID PC  . levothyroxine  112 mcg Oral QAC breakfast  . losartan  100 mg Oral Daily  . mouth rinse  15 mL Mouth Rinse BID  . metFORMIN  500 mg Oral BID WC  . methylPREDNISolone (SOLU-MEDROL) injection  80 mg Intravenous Daily  . montelukast  10 mg Oral QHS  . pantoprazole  40 mg Oral BID WC  . pramipexole  1-1.5 mg Oral QHS  . [START ON 02/27/2020] tiotropium  18 mcg Inhalation Daily   . traZODone  150 mg Oral QHS    Continuous Infusions: . sodium chloride    . cefTRIAXone (ROCEPHIN)  IV 1 g (02/22/20 1642)     LOS: 0 days     Kayleen Memos, MD [facility] Pager [phone]  If 7PM-7AM, please contact night-coverage [email or web address] Password [facility] 02/22/2020, 4:52 PM

## 2020-02-22 NOTE — Progress Notes (Signed)
Inpatient Diabetes Program Recommendations  AACE/ADA: New Consensus Statement on Inpatient Glycemic Control (2015)  Target Ranges:  Prepandial:   less than 140 mg/dL      Peak postprandial:   less than 180 mg/dL (1-2 hours)      Critically ill patients:  140 - 180 mg/dL   Lab Results  Component Value Date   GLUCAP 122 (H) 02/22/2020   HGBA1C 6.8 (H) 02/21/2020    Review of Glycemic Control Results for Kristin [name], Kristin [name] (MRN [id number]) as of 02/22/2020 10:52  Ref. Range 02/21/2020 17:10 02/21/2020 22:01 02/22/2020 07:35  Glucose-Capillary Latest Ref Range: 70 - 99 mg/dL 282 (H) 241 (H) 122 (H)   Diabetes history: DM 2 Outpatient Diabetes medications: Metformin 500 mg bid Current orders for Inpatient glycemic control:  Novolog moderate tid with meals and HS Metformin 500 mg bid Solumedrol 80 mg daily  Inpatient Diabetes Program Recommendations:    Anticipate that blood sugars will increase with addition of steroids.  Consider adding Novolog 3 units tid with meals (hold if patient eats less than 50%).   Thanks,  Adah Perl, RN, BC-ADM Inpatient Diabetes Coordinator Pager [phone] (8a-5p)

## 2020-02-22 NOTE — Progress Notes (Signed)
patient just had several loose BM's back to back. She said this  happens to her maybe 1-2 times a week and she uses imodium at home. Dr Archie Patten notified and order received for imodium

## 2020-02-23 LAB — GLUCOSE, CAPILLARY
Glucose-Capillary: 123 mg/dL — ABNORMAL HIGH (ref 70–99)
Glucose-Capillary: 206 mg/dL — ABNORMAL HIGH (ref 70–99)
Glucose-Capillary: 197 mg/dL — ABNORMAL HIGH (ref 70–99)
Glucose-Capillary: 146 mg/dL — ABNORMAL HIGH (ref 70–99)
Glucose-Capillary: 109 mg/dL — ABNORMAL HIGH (ref 70–99)

## 2020-02-23 MED ORDER — INSULIN ASPART 100 UNIT/ML ~~LOC~~ SOLN
3.0000 [IU] | Freq: Three times a day (TID) | SUBCUTANEOUS | Status: DC
  Administered 2020-02-23 – 2020-02-25 (×6): 3 [IU] via SUBCUTANEOUS
  Filled 2020-02-23 (×6): qty 1

## 2020-02-23 MED ORDER — SODIUM CHLORIDE 3 % IN NEBU
4.0000 mL | INHALATION_SOLUTION | Freq: Two times a day (BID) | RESPIRATORY_TRACT | Status: DC
  Administered 2020-02-23 (×2): 4 mL via RESPIRATORY_TRACT
  Filled 2020-02-23 (×4): qty 4

## 2020-02-23 MED ORDER — SODIUM CHLORIDE 0.9 % IV SOLN
1.0000 g | INTRAVENOUS | Status: DC
  Administered 2020-02-23: 1 g via INTRAVENOUS
  Filled 2020-02-23 (×2): qty 10

## 2020-02-23 MED ORDER — DM-GUAIFENESIN ER 30-600 MG PO TB12
2.0000 | ORAL_TABLET | Freq: Two times a day (BID) | ORAL | Status: AC
  Administered 2020-02-23 – 2020-02-25 (×5): 2 via ORAL
  Filled 2020-02-23 (×3): qty 2
  Filled 2020-02-23: qty 1
  Filled 2020-02-23: qty 2

## 2020-02-23 MED ORDER — METHYLPREDNISOLONE SODIUM SUCC 40 MG IJ SOLR
40.0000 mg | Freq: Two times a day (BID) | INTRAMUSCULAR | Status: DC
  Administered 2020-02-23 – 2020-02-24 (×2): 40 mg via INTRAVENOUS
  Filled 2020-02-23 (×2): qty 1

## 2020-02-23 MED ORDER — AZITHROMYCIN 250 MG PO TABS
250.0000 mg | ORAL_TABLET | Freq: Every day | ORAL | Status: DC
  Administered 2020-02-24: 250 mg via ORAL
  Filled 2020-02-23: qty 1

## 2020-02-23 MED ORDER — AZITHROMYCIN 250 MG PO TABS
500.0000 mg | ORAL_TABLET | Freq: Every day | ORAL | Status: AC
  Administered 2020-02-23: 500 mg via ORAL
  Filled 2020-02-23: qty 2

## 2020-02-23 NOTE — Progress Notes (Addendum)
PROGRESS NOTE  Kristin [name] FGH:[id number] DOB: 08-Sep-1945 DOA: 02/21/2020 PCP: Tracie Harrier, MD  HPI/Recap of past 24 hours: Kristin [name] is a 74 y.o. 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  She reports the shortness of breath since 02/17/20 and has worsened.  She presented to the emergency department and was given conservative treatments on 02/19/2020.  She was given duo nebs and Keflex 500 mg twice daily.  She was not given steroids due to history of diabetes mellitus.  She states she uses her nebulizer twice per day and tried her rescue inhaler without symptom improvement.  She denies sick contact. She feels she needs to cough up plegm but is unable to cough it up.  She endorsed a brief headache that is improved since being in the ED. She denies fever, chills, chest pain, abdominal pain, dysuria, hematuria.  She endorsed dysuria and urgency 3 days ago and was prescribed antibiotics on 02/19/2020.  She endorses diarrhea with loose stool twice.  She endorsed nausea and vomiting x 1 on 02/20/20.  She denies blood in stool.  She endorses some swelling in her lower extremities.   ED Course: Discussed with ED provider, requesting admission for COPD exacerbation failed outpatient conservative therapy.  02/23/20: Seen and examined with her husband at her bedside.  She is very weak appearing and has a persistent productive cough.  Ordered pulmonary toilet.  Reports chest discomfort along with her persistent cough ordered twelve-lead EKG to further assess, reviewed in normal sinus rhythm, no specific ST-T changes.  Assessment/Plan: Principal Problem:   COPD exacerbation (HCC) Active Problems:   Acute respiratory  failure (HCC)   UTI (urinary tract infection)   Chronic respiratory failure with hypoxia (HCC)   Weakness generalized   Diabetes (HCC)   Essential hypertension   Acute exacerbation of chronic obstructive pulmonary disease (COPD) (HCC)  Acute COPD exacerbation Presented with worsening dyspnea No evidence of active disease on chest x-ray Admitted for acute COPD exacerbation, started on IV Solu-Medrol,  Continue IV Solu-Medrol 40 mg twice daily Added pulmonary toilet, Mucinex 1200 mg twice daily, hypersaline nebs twice daily, chest PT twice daily x3 days. Added Z-Pak for its anti-inflammatory properties. Continue bronchodilators, incentive spirometer and flutter valve Continue to maintain O2 saturation greater than 90%  Worsening productive cough in the setting of acute COPD exacerbation Added pulmonary toilet Mucinex 1200 mg twice daily, hypersaline nebs twice daily, chest PT twice daily x3 days. Added Z-Pak for its anti-inflammatory properties.  Acute on chronic hypoxic respiratory failure She is on 4 L nasal cannula at baseline continuously Currently back to her baseline, was previously on 5 L nasal cannula, on BiPAP at presentation  Presumed UTI UA positive for pyuria on 02/19/2020 No urine culture or blood cultures data. Treated empirically with Rocephin, continue  Chronic intermittent loose stools Chronic per the patient, she states she takes Imodium as needed at home Afebrile with no leukocytosis Continue Imodium as needed  Chronic anxiety/depression Continue home regimen.  Type 2 diabetes with hyperglycemia Hemoglobin A1c 6.8 on 02/21/2020 Continue insulin sliding scale  Chronic normocytic anemia Baseline hemoglobin appears to be 11 Hemoglobin 10.2 No overt bleeding Continue to monitor H&H  Chronic diastolic CHF Last 2D echo done on 05/24/2016 showed normal LVEF Euvolemic on exam Continue strict I's and O's  and daily weight  GERD Continue home regimen she is  on PPI twice daily  Hyperlipidemia Continue home regimen  History of COVID-19 pneumonia 05/27/19 Continue current management    Code Status: Full code  Family Communication: Husband at bedside.  Disposition Plan: Likely discharge to home in 02/25/2020   Consultants: None Procedures:  None  Antimicrobials:  Rocephin 02/19/20>>  Z-Pak added on 02/23/2020  DVT prophylaxis: Subcu Lovenox daily  Status is: Inpatient    Dispo:  Patient From: Home  Planned Disposition: Home  Expected discharge date: 02/25/20  Medically stable for discharge: No, ongoing management of COPD exacerbation, presumed UTI.         Objective: Vitals:   02/23/20 0427 02/23/20 0743 02/23/20 0848 02/23/20 1136  BP: (!) 120/57 123/64  (!) 139/50  Pulse: 70 70  84  Resp: 20 20    Temp: 98.1 F (36.7 C) 97.8 F (36.6 C)  (!) 97.5 F (36.4 C)  TempSrc: Oral Oral  Oral  SpO2: 98% 97% 96% 96%  Weight:      Height:        Intake/Output Summary (Last 24 hours) at 02/23/2020 1341 Last data filed at 02/23/2020 1100 Gross per 24 hour  Intake 480 ml  Output 700 ml  Net -220 ml   Filed Weights   02/21/20 1312  Weight: 59 kg    Exam:  . General: 74 y.o. year-old female chronically ill-appearing no acute distress.  Weak appearing.  Alert and oriented x3.   . Cardiovascular: Regular rate and rhythm no rubs or gallops.  Marland Kitchen Respiratory: Mild rales diffusely, mild wheezing diffusely.  Poor inspiratory effort.  Persistent productive cough on exam.  . Abdomen: Soft nontender normal bowel sounds present. \ . Musculoskeletal: No lower extremity edema bilaterally.   Marland Kitchen Psychiatry: Mood is appropriate for condition and setting.   Data Reviewed: CBC: Recent Labs  Lab 02/19/20 1718 02/21/20 1257 02/22/20 0359  WBC 7.3 11.4* 7.5  NEUTROABS  --  6.6  --   HGB 11.7* 11.0* 10.2*  HCT 35.4* 34.5* 31.4*  MCV 95.4 95.8 96.3  PLT 205 202 782   Basic Metabolic Panel: Recent Labs  Lab  02/19/20 1718 02/21/20 1257 02/22/20 0359  NA 137 137 140  K 4.5 3.9 4.2  CL 102 103 106  CO2 27 23 27   GLUCOSE 139* 186* 132*  BUN 24* 22 21  CREATININE 1.06* 1.03* 0.85  CALCIUM 10.0 9.2 8.7*  MG  --  3.5*  --    GFR: Estimated Creatinine Clearance: 50.1 mL/min (by C-G formula based on SCr of 0.85 mg/dL). Liver Function Tests: Recent Labs  Lab 02/21/20 1257  AST 16  ALT 16  ALKPHOS 39  BILITOT 0.8  PROT 6.7  ALBUMIN 4.3   No results for input(s): LIPASE, AMYLASE in the last 168 hours. No results for input(s): AMMONIA in the last 168 hours. Coagulation Profile: No results for input(s): INR, PROTIME in the last 168 hours. Cardiac Enzymes: No results for input(s): CKTOTAL, CKMB, CKMBINDEX, TROPONINI in the last 168 hours. BNP (last 3 results) No results for input(s): PROBNP in the last 8760 hours. HbA1C: Recent Labs    02/21/20 1451  HGBA1C 6.8*   CBG: Recent Labs  Lab 02/22/20 1105 02/22/20 1659 02/22/20 2059 02/23/20 0752 02/23/20 1133  GLUCAP 257* 215* 161* 123* 206*   Lipid Profile: No results for input(s): CHOL, HDL, LDLCALC, TRIG, CHOLHDL, LDLDIRECT in the last 72 hours. Thyroid Function Tests: Recent Labs  02/21/20 1451  TSH 4.716*   Anemia Panel: No results for input(s): VITAMINB12, FOLATE, FERRITIN, TIBC, IRON, RETICCTPCT in the last 72 hours. Urine analysis:    Component Value Date/Time   COLORURINE YELLOW (A) 02/19/2020 2000   APPEARANCEUR HAZY (A) 02/19/2020 2000   APPEARANCEUR Clear 03/18/2013 1319   LABSPEC 1.016 02/19/2020 2000   LABSPEC 1.005 03/18/2013 1319   PHURINE 6.0 02/19/2020 2000   GLUCOSEU NEGATIVE 02/19/2020 2000   GLUCOSEU Negative 03/18/2013 1319   HGBUR NEGATIVE 02/19/2020 2000   BILIRUBINUR NEGATIVE 02/19/2020 2000   BILIRUBINUR Negative 03/18/2013 1319   KETONESUR NEGATIVE 02/19/2020 2000   PROTEINUR NEGATIVE 02/19/2020 2000   UROBILINOGEN 1.0 06/24/2008 1545   NITRITE NEGATIVE 02/19/2020 2000    LEUKOCYTESUR SMALL (A) 02/19/2020 2000   LEUKOCYTESUR 1+ 03/18/2013 1319   Sepsis Labs: @LABRCNTIP (procalcitonin:4,lacticidven:4)  ) Recent Results (from the past 240 hour(s))  Resp Panel by RT-PCR (Flu A&B, Covid) Nasopharyngeal Swab     Status: None   Collection Time: 02/19/20  7:18 PM   Specimen: Nasopharyngeal Swab; Nasopharyngeal(NP) swabs in vial transport medium  Result Value Ref Range Status   SARS Coronavirus 2 by RT PCR NEGATIVE NEGATIVE Final    Comment: (NOTE) SARS-CoV-2 target nucleic acids are NOT DETECTED.  The SARS-CoV-2 RNA is generally detectable in upper respiratory specimens during the acute phase of infection. The lowest concentration of SARS-CoV-2 viral copies this assay can detect is 138 copies/mL. A negative result does not preclude SARS-Cov-2 infection and should not be used as the sole basis for treatment or other patient management decisions. A negative result may occur with  improper specimen collection/handling, submission of specimen other than nasopharyngeal swab, presence of viral mutation(s) within the areas targeted by this assay, and inadequate number of viral copies(<138 copies/mL). A negative result must be combined with clinical observations, patient history, and epidemiological information. The expected result is Negative.  Fact Sheet for Patients:  EntrepreneurPulse.com.au  Fact Sheet for Healthcare Providers:  IncredibleEmployment.be  This test is no t yet approved or cleared by the [place] FDA and  has been authorized for detection and/or diagnosis of SARS-CoV-2 by FDA under an Emergency Use Authorization (EUA). This EUA will remain  in effect (meaning this test can be used) for the duration of the COVID-19 declaration under Section 564(b)(1) of the Act, 21 U.S.C.section 360bbb-3(b)(1), unless the authorization is terminated  or revoked sooner.       Influenza A by PCR NEGATIVE NEGATIVE  Final   Influenza B by PCR NEGATIVE NEGATIVE Final    Comment: (NOTE) The Xpert Xpress SARS-CoV-2/FLU/RSV plus assay is intended as an aid in the diagnosis of influenza from Nasopharyngeal swab specimens and should not be used as a sole basis for treatment. Nasal washings and aspirates are unacceptable for Xpert Xpress SARS-CoV-2/FLU/RSV testing.  Fact Sheet for Patients: EntrepreneurPulse.com.au  Fact Sheet for Healthcare Providers: IncredibleEmployment.be  This test is not yet approved or cleared by the [place] FDA and has been authorized for detection and/or diagnosis of SARS-CoV-2 by FDA under an Emergency Use Authorization (EUA). This EUA will remain in effect (meaning this test can be used) for the duration of the COVID-19 declaration under Section 564(b)(1) of the Act, 21 U.S.C. section 360bbb-3(b)(1), unless the authorization is terminated or revoked.  Performed at [facility], [address] [place], [place] [address]   Resp Panel by RT-PCR (Flu A&B, Covid) Nasopharyngeal Swab     Status: None   Collection Time: 02/21/20 12:56 PM  Specimen: Nasopharyngeal Swab; Nasopharyngeal(NP) swabs in vial transport medium  Result Value Ref Range Status   SARS Coronavirus 2 by RT PCR NEGATIVE NEGATIVE Final    Comment: (NOTE) SARS-CoV-2 target nucleic acids are NOT DETECTED.  The SARS-CoV-2 RNA is generally detectable in upper respiratory specimens during the acute phase of infection. The lowest concentration of SARS-CoV-2 viral copies this assay can detect is 138 copies/mL. A negative result does not preclude SARS-Cov-2 infection and should not be used as the sole basis for treatment or other patient management decisions. A negative result may occur with  improper specimen collection/handling, submission of specimen other than nasopharyngeal swab, presence of viral mutation(s) within the areas targeted by this assay, and  inadequate number of viral copies(<138 copies/mL). A negative result must be combined with clinical observations, patient history, and epidemiological information. The expected result is Negative.  Fact Sheet for Patients:  EntrepreneurPulse.com.au  Fact Sheet for Healthcare Providers:  IncredibleEmployment.be  This test is no t yet approved or cleared by the [place] FDA and  has been authorized for detection and/or diagnosis of SARS-CoV-2 by FDA under an Emergency Use Authorization (EUA). This EUA will remain  in effect (meaning this test can be used) for the duration of the COVID-19 declaration under Section 564(b)(1) of the Act, 21 U.S.C.section 360bbb-3(b)(1), unless the authorization is terminated  or revoked sooner.       Influenza A by PCR NEGATIVE NEGATIVE Final   Influenza B by PCR NEGATIVE NEGATIVE Final    Comment: (NOTE) The Xpert Xpress SARS-CoV-2/FLU/RSV plus assay is intended as an aid in the diagnosis of influenza from Nasopharyngeal swab specimens and should not be used as a sole basis for treatment. Nasal washings and aspirates are unacceptable for Xpert Xpress SARS-CoV-2/FLU/RSV testing.  Fact Sheet for Patients: EntrepreneurPulse.com.au  Fact Sheet for Healthcare Providers: IncredibleEmployment.be  This test is not yet approved or cleared by the [place] FDA and has been authorized for detection and/or diagnosis of SARS-CoV-2 by FDA under an Emergency Use Authorization (EUA). This EUA will remain in effect (meaning this test can be used) for the duration of the COVID-19 declaration under Section 564(b)(1) of the Act, 21 U.S.C. section 360bbb-3(b)(1), unless the authorization is terminated or revoked.  Performed at [facility], [address], [place], [place] [address]       Studies: No results found.  Scheduled Meds: . amLODipine  10 mg Oral Daily  .  atorvastatin  40 mg Oral Daily  . [START ON 02/24/2020] azithromycin  250 mg Oral Daily  . citalopram  20 mg Oral Daily  . clonazePAM  1 mg Oral q AM  . dextromethorphan-guaiFENesin  2 tablet Oral BID  . enoxaparin (LOVENOX) injection  40 mg Subcutaneous Q24H  . famotidine  20 mg Oral Daily  . fenofibrate  160 mg Oral Daily  . hydrALAZINE  25 mg Oral BID  . insulin aspart  0-15 Units Subcutaneous TID WC  . insulin aspart  0-5 Units Subcutaneous QHS  . insulin aspart  3 Units Subcutaneous TID WC  . ipratropium-albuterol  3 mL Nebulization TID PC  . levothyroxine  112 mcg Oral QAC breakfast  . losartan  100 mg Oral Daily  . mouth rinse  15 mL Mouth Rinse BID  . metFORMIN  500 mg Oral BID WC  . methylPREDNISolone (SOLU-MEDROL) injection  40 mg Intravenous Q12H  . montelukast  10 mg Oral QHS  . pantoprazole  40 mg Oral BID WC  .  pramipexole  1-1.5 mg Oral QHS  . sodium chloride HYPERTONIC  4 mL Nebulization BID  . [START ON 02/27/2020] tiotropium  18 mcg Inhalation Daily  . traZODone  150 mg Oral QHS    Continuous Infusions: . sodium chloride    . cefTRIAXone (ROCEPHIN)  IV       LOS: 1 day     Kayleen Memos, MD [facility] Pager [phone]  If 7PM-7AM, please contact night-coverage [email or web address] Password [email or web address] 02/23/2020, 1:41 PM

## 2020-02-24 LAB — GLUCOSE, CAPILLARY
Glucose-Capillary: 136 mg/dL — ABNORMAL HIGH (ref 70–99)
Glucose-Capillary: 113 mg/dL — ABNORMAL HIGH (ref 70–99)
Glucose-Capillary: 212 mg/dL — ABNORMAL HIGH (ref 70–99)
Glucose-Capillary: 123 mg/dL — ABNORMAL HIGH (ref 70–99)

## 2020-02-24 LAB — BASIC METABOLIC PANEL
Anion gap: 9 (ref 5–15)
BUN: 21 mg/dL (ref 8–23)
CO2: 26 mmol/L (ref 22–32)
Calcium: 8.9 mg/dL (ref 8.9–10.3)
Chloride: 101 mmol/L (ref 98–111)
Creatinine, Ser: 0.89 mg/dL (ref 0.44–1.00)
GFR, Estimated: >60 mL/min (ref >60)
Glucose, Bld: 166 mg/dL — ABNORMAL HIGH (ref 70–99)
Potassium: 4.2 mmol/L (ref 3.5–5.1)
Sodium: 136 mmol/L (ref 135–145)

## 2020-02-24 LAB — MAGNESIUM: Magnesium: 2.1 mg/dL (ref 1.7–2.4)

## 2020-02-24 LAB — PROCALCITONIN: Procalcitonin: <0.1 ng/mL

## 2020-02-24 MED ORDER — METHYLPREDNISOLONE SODIUM SUCC 40 MG IJ SOLR
40.0000 mg | INTRAMUSCULAR | Status: DC
  Administered 2020-02-25: 09:00:00 40 mg via INTRAVENOUS
  Filled 2020-02-24: qty 1

## 2020-02-24 MED ORDER — PRAMIPEXOLE DIHYDROCHLORIDE 0.25 MG PO TABS
0.5000 mg | ORAL_TABLET | Freq: Two times a day (BID) | ORAL | Status: DC
  Administered 2020-02-24 – 2020-02-26 (×4): 0.5 mg via ORAL
  Filled 2020-02-24 (×4): qty 2

## 2020-02-24 MED ORDER — SODIUM CHLORIDE 0.9 % IV SOLN
3.0000 g | Freq: Four times a day (QID) | INTRAVENOUS | Status: DC
  Administered 2020-02-24 – 2020-02-25 (×2): 3 g via INTRAVENOUS
  Filled 2020-02-24: qty 8
  Filled 2020-02-24: qty 3
  Filled 2020-02-24 (×4): qty 8

## 2020-02-24 MED ORDER — FUROSEMIDE 10 MG/ML IJ SOLN
20.0000 mg | Freq: Every day | INTRAMUSCULAR | Status: DC
  Administered 2020-02-24 – 2020-02-26 (×3): 20 mg via INTRAVENOUS
  Filled 2020-02-24 (×3): qty 4

## 2020-02-24 NOTE — Consult Note (Signed)
Pulmonary Medicine          Date: 02/24/2020,   MRN# [id number] Devanshi [facility]iff [facility] [birth year]     AdmissionWeight: 59 kg                 CurrentWeight: 59 kg   Referring physician: Dr. [place] Crane   CHIEF COMPLAINT:   Increased O2 requirement with failure to wean   HISTORY OF PRESENT ILLNESS   Pleasant 74 year old female with history of advanced COPD and chronic hypoxemia using 4 L nasal cannula supplemental oxygen at baseline.  Also has a history of CVA and diabetes essential hypertension came into the hospital with worsening dyspnea increased phlegm production and discoloration from suspicious for acute exacerbation of COPD.  Despite using rescue inhaler numerous times as well as her nebulizer she continued to get worse which required ED evaluation.  Additionally she complained of dysuria upon presentation.  Pulmonary consultation was placed due to failure to wean oxygen therapy.  Most recent chest x-ray on 12 5 evaluated by me showing emphysematous lungs with eventration of the right hemidiaphragm however absence of pulmonary edema, infiltrate to suggest pneumonia, pneumothorax.   PAST MEDICAL HISTORY   Past Medical History:  Diagnosis Date  . Asthma   . Cancer [facility]) 2010   Left ear cancer  . COPD (chronic obstructive pulmonary disease) ([place])   . COVID-19 05/27/2019  . Diabetes mellitus without complication ([place])   . Emphysema lung ([place])   . Hypertension   . Stroke [facility])      SURGICAL HISTORY   Past Surgical History:  Procedure Laterality Date  . ABDOMINAL HYSTERECTOMY    . BREAST BIOPSY Left 1990   neg  . CHOLECYSTECTOMY    . COLONOSCOPY WITH PROPOFOL N/A 12/01/2017   Procedure: COLONOSCOPY WITH PROPOFOL;  Surgeon: Toledo, Benay Pike, MD;  Location: [facility] ENDOSCOPY;  Service: Gastroenterology;  Laterality: N/A;  . ESOPHAGOGASTRODUODENOSCOPY N/A 12/01/2017   Procedure: ESOPHAGOGASTRODUODENOSCOPY (EGD);  Surgeon: Toledo, Benay Pike, MD;  Location: [facility]  ENDOSCOPY;  Service: Gastroenterology;  Laterality: N/A;  . NECK SURGERY       FAMILY HISTORY   Family History  Problem Relation Age of Onset  . Ovarian cancer Mother   . Lung cancer Father   . Breast cancer Paternal Aunt      SOCIAL HISTORY   Social History   Tobacco Use  . Smoking status: Former Research scientist (life sciences)  . Smokeless tobacco: Never Used  Vaping Use  . Vaping Use: Never used  Substance Use Topics  . Alcohol use: No  . Drug use: No     MEDICATIONS    Home Medication:    Current Medication:  Current Facility-Administered Medications:  .  0.9 %  sodium chloride infusion, , Intravenous, PRN, Kayleen Memos, DO .  acetaminophen (TYLENOL) tablet 325 mg, 325 mg, Oral, Q6H PRN, 325 mg at 02/24/20 0615 **OR** acetaminophen (TYLENOL) suppository 650 mg, 650 mg, Rectal, Q6H PRN, Cox, Amy N, DO .  albuterol (VENTOLIN HFA) 108 (90 Base) MCG/ACT inhaler 2 puff, 2 puff, Inhalation, Q4H PRN, Cox, Amy N, DO, 2 puff at 02/22/20 2035 .  amLODipine (NORVASC) tablet 10 mg, 10 mg, Oral, Daily, Cox, Amy N, DO, 10 mg at 02/24/20 0906 .  atorvastatin (LIPITOR) tablet 40 mg, 40 mg, Oral, Daily, Lu Duffel, RPH, 40 mg at 02/24/20 9622 .  [COMPLETED] azithromycin (ZITHROMAX) tablet 500 mg, 500 mg, Oral, Daily, 500 mg at 02/23/20 1144 **FOLLOWED BY** azithromycin (ZITHROMAX) tablet 250 mg, 250  mg, Oral, Daily, Irene Pap N, DO, 250 mg at 02/24/20 2297 .  cefTRIAXone (ROCEPHIN) 1 g in sodium chloride 0.9 % 100 mL IVPB, 1 g, Intravenous, Q24H, Hall, [place], DO, Stopped at 02/23/20 1804 .  citalopram (CELEXA) tablet 20 mg, 20 mg, Oral, Daily, Cox, Amy N, DO, 20 mg at 02/24/20 0906 .  clonazePAM (KLONOPIN) tablet 1 mg, 1 mg, Oral, q AM, Cox, Amy N, DO, 1 mg at 02/24/20 9892 .  dextromethorphan-guaiFENesin (MUCINEX DM) 30-600 MG per 12 hr tablet 2 tablet, 2 tablet, Oral, BID, Kayleen Memos, DO, 2 tablet at 02/24/20 0906 .  enoxaparin (LOVENOX) injection 40 mg, 40 mg, Subcutaneous, Q24H,  Cox, Amy N, DO, 40 mg at 02/23/20 2029 .  famotidine (PEPCID) tablet 20 mg, 20 mg, Oral, Daily, Cox, Amy N, DO, 20 mg at 02/24/20 0907 .  fenofibrate tablet 160 mg, 160 mg, Oral, Daily, Cox, Amy N, DO, 160 mg at 02/24/20 0906 .  hydrALAZINE (APRESOLINE) tablet 25 mg, 25 mg, Oral, BID, Cox, Amy N, DO, 25 mg at 02/24/20 0906 .  insulin aspart (novoLOG) injection 0-15 Units, 0-15 Units, Subcutaneous, TID WC, Cox, Amy N, DO, 2 Units at 02/24/20 0907 .  insulin aspart (novoLOG) injection 0-5 Units, 0-5 Units, Subcutaneous, QHS, Cox, Amy N, DO, 5 Units at 02/21/20 2211 .  insulin aspart (novoLOG) injection 3 Units, 3 Units, Subcutaneous, TID WC, Kayleen Memos, DO, 3 Units at 02/24/20 1194 .  ipratropium-albuterol (DUONEB) 0.5-2.5 (3) MG/3ML nebulizer solution 3 mL, 3 mL, Nebulization, TID PC, Cox, Amy N, DO, 3 mL at 02/24/20 0858 .  levothyroxine (SYNTHROID) tablet 112 mcg, 112 mcg, Oral, QAC breakfast, Cox, Amy N, DO, 112 mcg at 02/24/20 0915 .  loperamide (IMODIUM) capsule 2 mg, 2 mg, Oral, PRN, Kayleen Memos, DO, 2 mg at 02/23/20 0902 .  losartan (COZAAR) tablet 100 mg, 100 mg, Oral, Daily, Cox, Amy N, DO, 100 mg at 02/23/20 0902 .  MEDLINE mouth rinse, 15 mL, Mouth Rinse, BID, Cox, Amy N, DO, 15 mL at 02/24/20 0909 .  metFORMIN (GLUCOPHAGE) tablet 500 mg, 500 mg, Oral, BID WC, Cox, Amy N, DO, 500 mg at 02/24/20 0906 .  methylPREDNISolone sodium succinate (SOLU-MEDROL) 40 mg/mL injection 40 mg, 40 mg, Intravenous, Q12H, Hall, Carole N, DO, 40 mg at 02/24/20 0907 .  montelukast (SINGULAIR) tablet 10 mg, 10 mg, Oral, QHS, Cox, Amy N, DO, 10 mg at 02/23/20 2028 .  ondansetron (ZOFRAN) tablet 4 mg, 4 mg, Oral, Q6H PRN **OR** ondansetron (ZOFRAN) injection 4 mg, 4 mg, Intravenous, Q6H PRN, Cox, Amy N, DO .  pantoprazole (PROTONIX) EC tablet 40 mg, 40 mg, Oral, BID WC, Cox, Amy N, DO, 40 mg at 02/24/20 0907 .  pramipexole (MIRAPEX) tablet 0.5 mg, 0.5 mg, Oral, BID, Dallie Piles, RPH .  sodium chloride  HYPERTONIC 3 % nebulizer solution 4 mL, 4 mL, Nebulization, BID, Hall, Carole N, DO, 4 mL at 02/23/20 2052 .  [START ON 02/27/2020] tiotropium (SPIRIVA) inhalation capsule ([facility] use ONLY) 18 mcg, 18 mcg, Inhalation, Daily, Dallie Piles, RPH .  traZODone (DESYREL) tablet 150 mg, 150 mg, Oral, QHS, Cox, Amy N, DO, 150 mg at 02/23/20 2029    ALLERGIES   Codeine, Morphine and related, and Prednisone     REVIEW OF SYSTEMS    Review of Systems:  Gen:  Denies  fever, sweats, chills weigh loss  HEENT: Denies blurred vision, double vision, ear pain, eye pain, hearing loss, nose  bleeds, sore throat Cardiac:  No dizziness, chest pain or heaviness, chest tightness,edema Resp:   Denies cough or sputum porduction, shortness of breath,wheezing, hemoptysis,  Gi: Denies swallowing difficulty, stomach pain, nausea or vomiting, diarrhea, constipation, bowel incontinence Gu:  Denies bladder incontinence, burning urine Ext:   Denies Joint pain, stiffness or swelling Skin: Denies  skin rash, easy bruising or bleeding or hives Endoc:  Denies polyuria, polydipsia , polyphagia or weight change Psych:   Denies depression, insomnia or hallucinations   Other:  All other systems negative   VS: BP 136/62 (BP Location: Left Arm)   Pulse 75   Temp 97.6 F (36.4 C) (Oral)   Resp 18   Ht 5\' 4"  (1.626 m)   Wt 59 kg   SpO2 99%   BMI 22.31 kg/m      PHYSICAL EXAM    GENERAL:NAD, no fevers, chills, no weakness no fatigue HEAD: Normocephalic, atraumatic.  EYES: Pupils equal, round, reactive to light. Extraocular muscles intact. No scleral icterus.  MOUTH: Moist mucosal membrane. Dentition intact. No abscess noted.  EAR, NOSE, THROAT: Clear without exudates. No external lesions.  NECK: Supple. No thyromegaly. No nodules. No JVD.  PULMONARY: Diffuse coarse rhonchi right sided +wheezes CARDIOVASCULAR: S1 and S2. Regular rate and rhythm. No murmurs, rubs, or gallops. No edema. Pedal pulses 2+  bilaterally.  GASTROINTESTINAL: Soft, nontender, nondistended. No masses. Positive bowel sounds. No hepatosplenomegaly.  MUSCULOSKELETAL: No swelling, clubbing, or edema. Range of motion full in all extremities.  NEUROLOGIC: Cranial nerves II through XII are intact. No gross focal neurological deficits. Sensation intact. Reflexes intact.  SKIN: No ulceration, lesions, rashes, or cyanosis. Skin warm and dry. Turgor intact.  PSYCHIATRIC: Mood, affect within normal limits. The patient is awake, alert and oriented x 3. Insight, judgment intact.       IMAGING    DG Chest 2 View  Result Date: 02/19/2020 CLINICAL DATA:  Short of breath.  COPD. EXAM: CHEST - 2 VIEW COMPARISON:  07/19/2019 FINDINGS: Cardiac silhouette is normal in size. No mediastinal or hilar masses or evidence of adenopathy. Lungs are hyperexpanded. Linear stable scarring noted at the lung bases. Lungs otherwise clear. No pleural effusion or pneumothorax. Skeletal structures are demineralized, but intact. Stable changes from a previous anterior cervical spine fusion. IMPRESSION: 1. No acute cardiopulmonary disease. 2. COPD.  Chronic lung base scarring. Electronically Signed   By: Lajean Manes M.D.   On: 02/19/2020 18:09   DG Chest Portable 1 View  Result Date: 02/21/2020 CLINICAL DATA:  COPD exacerbation on BiPAP. EXAM: PORTABLE CHEST 1 VIEW COMPARISON:  02/19/2020 FINDINGS: The cardiomediastinal silhouette is within normal limits. Aortic atherosclerosis is noted. Emphysema and mild bibasilar lung scarring are again noted. No acute airspace consolidation, edema, pleural effusion, or pneumothorax is identified. Prior anterior cervical fusion is noted. IMPRESSION: No active disease. Electronically Signed   By: Logan Bores M.D.   On: 02/21/2020 13:38   MM 3D SCREEN BREAST BILATERAL  Result Date: 02/12/2020 CLINICAL DATA:  Screening. EXAM: DIGITAL SCREENING BILATERAL MAMMOGRAM WITH TOMO AND CAD COMPARISON:  Previous exam(s). ACR  Breast Density Category b: There are scattered areas of fibroglandular density. FINDINGS: There are no findings suspicious for malignancy. Images were processed with CAD. IMPRESSION: No mammographic evidence of malignancy. A result letter of this screening mammogram will be mailed directly to the patient. RECOMMENDATION: Screening mammogram in one year. (Code:SM-B-01Y) BI-RADS CATEGORY  1: Negative. Electronically Signed   By: Lajean Manes M.D.   On: 02/12/2020 13:55  ASSESSMENT/PLAN   Acute on chronic hypoxemic respiratory failure -Due to acute exacerbation of COPD -patient reports aspiration of food on her birthday last week- "choked to death" she states after having esophagus dilated x3 she has regurgitation.  We discussed follow up with GI who she is established with.   -Aspiration is likely cause of her exacerbation of COPD  -I will order barium swallow for now - She is close to baseline on her O2 requirement - currently on 4L which is what she uses at home -will need to start MetaNEB x 1 day for recruitment maneuvers.  -can d/c hypertonic saline -decreased solumedrol to once daily IV 40 -will order legionella ab due to diarreah with resp distress    Thank you for allowing me to participate in the care of this patient.   Patient/Family are satisfied with care plan and all questions have been answered.   This document was prepared using Dragon voice recognition software and may include unintentional dictation errors.     Ottie Glazier, M.D.  Division of [place]

## 2020-02-24 NOTE — Evaluation (Signed)
Physical Therapy Evaluation Patient Details Name: Kristin [name] MRN: [id number] DOB: 1946-02-02 Today's Date: 02/24/2020   History of Present Illness  Pt is a 75 y.o. 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 Admitted for COPD exacerbation.    Clinical Impression  Pt alert, agreeable to PT, denied pain. At baseline pt reported she in modI/I for mobility (standard walker makes using the walker difficulty), and modI for ADLs but accepts helps/supervision from husband as needed. Denied falls.  The patient was able to perform bed mobility modI, and sit for several minutes with good balance. Sit <> stand from EOB with RW and supervision, cued for hand placement to maximize safety. She ambulated ~3ft total in room with RW and CGA. She did experience 1 LOB (minA to correct), but able to continue ambulating after assist. She endorsed fatigue especially of UE and LEs while walking, but normally only ambulates a household distance.  Overall the patient demonstrated deficits (see "PT Problem List") that impede the patient's functional abilities, safety, and mobility and would benefit from skilled PT intervention. Recommendation is HHPT to maximize mobility, safety, and independence with supervision for mobility/OOB.     Follow Up Recommendations Home health PT;Supervision for mobility/OOB    Equipment Recommendations  Rolling walker with 5" wheels;Other (comment) (PT/RN/aide)    Recommendations for Other Services       Precautions / Restrictions Precautions Precautions: Fall Restrictions Weight Bearing Restrictions: No      Mobility  Bed Mobility Overal bed mobility: Modified Independent                  Transfers Overall  transfer level: Needs assistance Equipment used: Rolling walker (2 wheeled) Transfers: Sit to/from Stand Sit to Stand: Supervision         General transfer comment: cued for hand placement  Ambulation/Gait Ambulation/Gait assistance: Min guard Gait Distance (Feet): 25 Feet Assistive device: Rolling walker (2 wheeled)   Gait velocity: decreased   General Gait Details: Pt able to ambulate with CGA, did experience one LOB with minA to correct; pt reported fatigue with her arms and her legs.  Stairs            Wheelchair Mobility    Modified Rankin (Stroke Patients Only)       Balance Overall balance assessment: Needs assistance Sitting-balance support: Feet supported Sitting balance-Leahy Scale: Good       Standing balance-Leahy Scale: Fair Standing balance comment: more comfortable/improved safety with RW support                             Pertinent Vitals/Pain Pain Assessment: No/denies pain    Home Living Family/patient expects to be discharged to:: Private residence Living Arrangements: Spouse/significant other Available Help at Discharge: Family Type of Home: House Home Access: Stairs to enter Entrance Stairs-Rails: None Entrance Stairs-Number of Steps: 1 (threshold) Home Layout: One level;Other (Comment) (with step down into dining room) Home Equipment: Walker - standard;Grab bars - tub/shower;Cane - quad;Tub bench;Bedside commode Additional Comments: no falls    Prior Function Level of Independence: Needs assistance   Gait / Transfers Assistance Needed: pt reported it varies;some days I, sometimes modI with standard walker or quad cane  ADL's / Homemaking Assistance Needed: I ADLs (majority of time  per pt, but husband has assisted in the past as needed), husband does IADLs, patient does not drive        Hand Dominance   Dominant Hand: Right    Extremity/Trunk Assessment   Upper Extremity Assessment Upper Extremity Assessment:  Generalized weakness    Lower Extremity Assessment Lower Extremity Assessment: Generalized weakness    Cervical / Trunk Assessment Cervical / Trunk Assessment: Kyphotic  Communication   Communication: No difficulties  Cognition Arousal/Alertness: Awake/alert Behavior During Therapy: WFL for tasks assessed/performed Overall Cognitive Status: Within Functional Limits for tasks assessed                                        General Comments      Exercises     Assessment/Plan    PT Assessment Patient needs continued PT services  PT Problem List Decreased strength;Decreased range of motion;Decreased activity tolerance;Decreased balance;Decreased knowledge of use of DME;Decreased mobility;Cardiopulmonary status limiting activity       PT Treatment Interventions DME instruction;Therapeutic exercise;Gait training;Balance training;Stair training;Neuromuscular re-education;Functional mobility training;Therapeutic activities;Patient/family education    PT Goals (Current goals can be found in the Care Plan section)  Acute Rehab PT Goals Patient Stated Goal: to feel better PT Goal Formulation: With patient Time For Goal Achievement: 03/09/20 Potential to Achieve Goals: Good    Frequency Min 2X/week   Barriers to discharge        Co-evaluation               AM-PAC PT "6 Clicks" Mobility  Outcome Measure Help needed turning from your back to your side while in a flat bed without using bedrails?: None Help needed moving from lying on your back to sitting on the side of a flat bed without using bedrails?: None Help needed moving to and from a bed to a chair (including a wheelchair)?: None Help needed standing up from a chair using your arms (e.g., wheelchair or bedside chair)?: None Help needed to walk in hospital room?: A Little Help needed climbing 3-5 steps with a railing? : A Little 6 Click Score: 22    End of Session Equipment Utilized During  Treatment: Gait belt Activity Tolerance: Patient tolerated treatment well Patient left: in bed;with family/visitor present;with call bell/phone within reach;with bed alarm set (MD at bedside) Nurse Communication: Mobility status PT Visit Diagnosis: Other abnormalities of gait and mobility (R26.89);Muscle weakness (generalized) (M62.81);Difficulty in walking, not elsewhere classified (R26.2)    Time: 1660-6301 PT Time Calculation (min) (ACUTE ONLY): 32 min   Charges:   PT Evaluation $PT Eval Low Complexity: 1 Low PT Treatments $Therapeutic Exercise: 23-37 mins       Lieutenant Diego PT, DPT 11:50 AM,02/24/20

## 2020-02-24 NOTE — Progress Notes (Addendum)
PROGRESS NOTE  Kristin [name] XMI:[id number] DOB: 09-Nov-1945 DOA: 02/21/2020 PCP: Tracie Harrier, MD  HPI/Recap of past 24 hours: Kristin [name] is a 74 y.o. 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  She reports the shortness of breath since 02/17/20 and has worsened.  She presented to the emergency department and was given conservative treatments on 02/19/2020.  She was given duo nebs and Keflex 500 mg twice daily.  She was not given steroids due to history of diabetes mellitus.  She states she uses her nebulizer twice per day and tried her rescue inhaler without symptom improvement.  She denies sick contact. She feels she needs to cough up plegm but is unable to cough it up.  She endorsed a brief headache that is improved since being in the ED. She denies fever, chills, chest pain, abdominal pain, dysuria, hematuria.  She endorsed dysuria and urgency 3 days ago and was prescribed antibiotics on 02/19/2020.  She endorses diarrhea with loose stool twice.  She endorsed nausea and vomiting x 1 on 02/20/20.  She denies blood in stool.  She endorses some swelling in her lower extremities.   ED Course: Discussed with ED provider, requesting admission for COPD exacerbation failed outpatient conservative therapy.  02/24/20: Seen and examined at her bedside.  Her husband is present.  She reports persistent dyspnea with minimal movement.  Pulmonary consulted to further assess.  She had reported aspiration of food on her birthday last week.  Barium swallow ordered by pulmonary to further assess.    Assessment/Plan: Principal Problem:   COPD exacerbation (HCC) Active Problems:   Acute respiratory failure (HCC)   UTI (urinary  tract infection)   Chronic respiratory failure with hypoxia (HCC)   Weakness generalized   Diabetes (HCC)   Essential hypertension   Acute exacerbation of chronic obstructive pulmonary disease (COPD) ([place])  Acute COPD exacerbation with suspected aspiration with dysphagia Presented with worsening dyspnea No evidence of active disease on chest x-ray Admitted for acute COPD exacerbation, started on IV Solu-Medrol Wean off IV Solu-Medrol as tolerated, currently on IV Solu-Medrol 40 mg daily. Continue pulmonary toilet, Mucinex 1200 mg twice daily, and Z-Pak. Hypersaline nebs discontinued per pulmonary.  Added Z-Pak for its anti-inflammatory properties. Continue bronchodilators, incentive spirometer and flutter valve Continue to maintain O2 saturation greater than 90%  Suspected aspiration pneumonia Patient has reported choking on food directability. Barium swallow ordered by pulmonary to further assess, follow results Aspiration precautions Started on Unasyn on 02/24/2020.  Acute on chronic hypoxic respiratory failure She is on 4 L nasal cannula at baseline continuously Currently back to her baseline, was previously on 5 L nasal cannula, on BiPAP at presentation Pulmonary consulted and following, appreciate accommodations.  Intermittent loose stools, chronically Legionella ab ordered by pulmonary in the setting of diarrhea with respiratory distress, follow results. Currently on home Imodium as needed She is afebrile  Presumptive UTI UA positive for pyuria on 02/19/2020 No urine culture or blood cultures data. Received 3 days of IV Rocephin  Chronic intermittent loose stools Chronic per the patient, she states she takes Imodium as needed at home Afebrile with no leukocytosis Continue Imodium as needed  Chronic anxiety/depression Continue home regimen.  Type 2 diabetes with hyperglycemia Hemoglobin A1c 6.8 on 02/21/2020 Continue insulin sliding scale  Chronic normocytic  anemia Baseline hemoglobin appears to be 11 Hemoglobin 10.2 No overt bleeding Continue to monitor H&H  Chronic diastolic CHF Last 2D echo done on 05/24/2016 showed normal LVEF Euvolemic on exam Continue strict I's and O's and daily weight  GERD Continue home regimen she is on PPI twice daily  Hyperlipidemia Continue home regimen  History of COVID-19 pneumonia 05/27/19 Continue current management  Ambulatory dysfunction PT assessment recommended home health PT OT DME rolling walker Continue PT with assistance and fall precautions.   Code Status: Full code  Family Communication: Updated husband at bedside.  Disposition Plan: Likely discharge to home in 02/25/2020   Consultants: None Procedures:  None  Antimicrobials:  Rocephin 02/19/20>> 02/24/2020  Z-Pak added on 02/23/2020  Unasyn 02/24/2020>>>  DVT prophylaxis: Subcu Lovenox daily  Status is: Inpatient    Dispo:  Patient From: Home  Planned Disposition: Home  Expected discharge date: 02/25/20  Medically stable for discharge: No, ongoing management of COPD exacerbation, presumed UTI, and suspected aspiration pneumonia..         Objective: Vitals:   02/24/20 0044 02/24/20 0438 02/24/20 0900 02/24/20 1223  BP: (!) 124/59 (!) 142/59 136/62 (!) 128/54  Pulse: 72 66 75 79  Resp: 20 16 18 18   Temp: 98.4 F (36.9 C) 97.9 F (36.6 C) 97.6 F (36.4 C) 98.2 F (36.8 C)  TempSrc: Oral Oral Oral   SpO2: 99% 96% 99% 93%  Weight:      Height:        Intake/Output Summary (Last 24 hours) at 02/24/2020 1651 Last data filed at 02/24/2020 1419 Gross per 24 hour  Intake 820 ml  Output 950 ml  Net -130 ml   Filed Weights   02/21/20 1312  Weight: 59 kg    Exam:  . General: 74 y.o. year-old female chronically ill-appearing no acute stress.  Alert and oriented x3. . Cardiovascular: Regular rate and rhythm no rubs or gallops. Marland Kitchen Respiratory: Mild rales at bases.  No wheezing noted.  Poor  inspiratory effort. . Abdomen: Soft nontender normal bowel sounds present. . Musculoskeletal: No lower extremity edema bilaterally.   Marland Kitchen Psychiatry: Mood is appropriate for condition and setting.  Data Reviewed: CBC: Recent Labs  Lab 02/19/20 1718 02/21/20 1257 02/22/20 0359  WBC 7.3 11.4* 7.5  NEUTROABS  --  6.6  --   HGB 11.7* 11.0* 10.2*  HCT 35.4* 34.5* 31.4*  MCV 95.4 95.8 96.3  PLT 205 202 924   Basic Metabolic Panel: Recent Labs  Lab 02/19/20 1718 02/21/20 1257 02/22/20 0359 02/24/20 0634  NA 137 137 140 136  K 4.5 3.9 4.2 4.2  CL 102 103 106 101  CO2 27 23 27 26   GLUCOSE 139* 186* 132* 166*  BUN 24* 22 21 21   CREATININE 1.06* 1.03* 0.85 0.89  CALCIUM 10.0 9.2 8.7* 8.9  MG  --  3.5*  --  2.1   GFR: Estimated Creatinine Clearance: 47.9 mL/min (by C-G formula based on SCr of 0.89 mg/dL). Liver Function Tests: Recent Labs  Lab 02/21/20 1257  AST 16  ALT 16  ALKPHOS 39  BILITOT 0.8  PROT 6.7  ALBUMIN 4.3   No results for input(s): LIPASE, AMYLASE in the last 168 hours. No results for input(s): AMMONIA in the last 168 hours. Coagulation Profile: No results for input(s): INR, PROTIME in the last 168 hours. Cardiac Enzymes: No results for input(s): CKTOTAL, CKMB, CKMBINDEX, TROPONINI in the last 168 hours. BNP (last 3 results) No results for input(s): PROBNP in  the last 8760 hours. HbA1C: No results for input(s): HGBA1C in the last 72 hours. CBG: Recent Labs  Lab 02/23/20 1657 02/23/20 2027 02/23/20 2122 02/24/20 0758 02/24/20 1215  GLUCAP 197* 146* 109* 136* 113*   Lipid Profile: No results for input(s): CHOL, HDL, LDLCALC, TRIG, CHOLHDL, LDLDIRECT in the last 72 hours. Thyroid Function Tests: No results for input(s): TSH, T4TOTAL, FREET4, T3FREE, THYROIDAB in the last 72 hours. Anemia Panel: No results for input(s): VITAMINB12, FOLATE, FERRITIN, TIBC, IRON, RETICCTPCT in the last 72 hours. Urine analysis:    Component Value Date/Time    COLORURINE YELLOW (A) 02/19/2020 2000   APPEARANCEUR HAZY (A) 02/19/2020 2000   APPEARANCEUR Clear 03/18/2013 1319   LABSPEC 1.016 02/19/2020 2000   LABSPEC 1.005 03/18/2013 1319   PHURINE 6.0 02/19/2020 2000   GLUCOSEU NEGATIVE 02/19/2020 2000   GLUCOSEU Negative 03/18/2013 1319   HGBUR NEGATIVE 02/19/2020 2000   BILIRUBINUR NEGATIVE 02/19/2020 2000   BILIRUBINUR Negative 03/18/2013 1319   KETONESUR NEGATIVE 02/19/2020 2000   PROTEINUR NEGATIVE 02/19/2020 2000   UROBILINOGEN 1.0 06/24/2008 1545   NITRITE NEGATIVE 02/19/2020 2000   LEUKOCYTESUR SMALL (A) 02/19/2020 2000   LEUKOCYTESUR 1+ 03/18/2013 1319   Sepsis Labs: @LABRCNTIP (procalcitonin:4,lacticidven:4)  ) Recent Results (from the past 240 hour(s))  Resp Panel by RT-PCR (Flu A&B, Covid) Nasopharyngeal Swab     Status: None   Collection Time: 02/19/20  7:18 PM   Specimen: Nasopharyngeal Swab; Nasopharyngeal(NP) swabs in vial transport medium  Result Value Ref Range Status   SARS Coronavirus 2 by RT PCR NEGATIVE NEGATIVE Final    Comment: (NOTE) SARS-CoV-2 target nucleic acids are NOT DETECTED.  The SARS-CoV-2 RNA is generally detectable in upper respiratory specimens during the acute phase of infection. The lowest concentration of SARS-CoV-2 viral copies this assay can detect is 138 copies/mL. A negative result does not preclude SARS-Cov-2 infection and should not be used as the sole basis for treatment or other patient management decisions. A negative result may occur with  improper specimen collection/handling, submission of specimen other than nasopharyngeal swab, presence of viral mutation(s) within the areas targeted by this assay, and inadequate number of viral copies(<138 copies/mL). A negative result must be combined with clinical observations, patient history, and epidemiological information. The expected result is Negative.  Fact Sheet for Patients:  EntrepreneurPulse.com.au  Fact Sheet  for Healthcare Providers:  IncredibleEmployment.be  This test is no t yet approved or cleared by the [place] FDA and  has been authorized for detection and/or diagnosis of SARS-CoV-2 by FDA under an Emergency Use Authorization (EUA). This EUA will remain  in effect (meaning this test can be used) for the duration of the COVID-19 declaration under Section 564(b)(1) of the Act, 21 U.S.C.section 360bbb-3(b)(1), unless the authorization is terminated  or revoked sooner.       Influenza A by PCR NEGATIVE NEGATIVE Final   Influenza B by PCR NEGATIVE NEGATIVE Final    Comment: (NOTE) The Xpert Xpress SARS-CoV-2/FLU/RSV plus assay is intended as an aid in the diagnosis of influenza from Nasopharyngeal swab specimens and should not be used as a sole basis for treatment. Nasal washings and aspirates are unacceptable for Xpert Xpress SARS-CoV-2/FLU/RSV testing.  Fact Sheet for Patients: EntrepreneurPulse.com.au  Fact Sheet for Healthcare Providers: IncredibleEmployment.be  This test is not yet approved or cleared by the [place] FDA and has been authorized for detection and/or diagnosis of SARS-CoV-2 by FDA under an Emergency Use Authorization (EUA). This EUA will remain in effect (meaning  this test can be used) for the duration of the COVID-19 declaration under Section 564(b)(1) of the Act, 21 U.S.C. section 360bbb-3(b)(1), unless the authorization is terminated or revoked.  Performed at [facility], [address] [place], [place] [address]   Resp Panel by RT-PCR (Flu A&B, Covid) Nasopharyngeal Swab     Status: None   Collection Time: 02/21/20 12:56 PM   Specimen: Nasopharyngeal Swab; Nasopharyngeal(NP) swabs in vial transport medium  Result Value Ref Range Status   SARS Coronavirus 2 by RT PCR NEGATIVE NEGATIVE Final    Comment: (NOTE) SARS-CoV-2 target nucleic acids are NOT DETECTED.  The SARS-CoV-2  RNA is generally detectable in upper respiratory specimens during the acute phase of infection. The lowest concentration of SARS-CoV-2 viral copies this assay can detect is 138 copies/mL. A negative result does not preclude SARS-Cov-2 infection and should not be used as the sole basis for treatment or other patient management decisions. A negative result may occur with  improper specimen collection/handling, submission of specimen other than nasopharyngeal swab, presence of viral mutation(s) within the areas targeted by this assay, and inadequate number of viral copies(<138 copies/mL). A negative result must be combined with clinical observations, patient history, and epidemiological information. The expected result is Negative.  Fact Sheet for Patients:  EntrepreneurPulse.com.au  Fact Sheet for Healthcare Providers:  IncredibleEmployment.be  This test is no t yet approved or cleared by the [place] FDA and  has been authorized for detection and/or diagnosis of SARS-CoV-2 by FDA under an Emergency Use Authorization (EUA). This EUA will remain  in effect (meaning this test can be used) for the duration of the COVID-19 declaration under Section 564(b)(1) of the Act, 21 U.S.C.section 360bbb-3(b)(1), unless the authorization is terminated  or revoked sooner.       Influenza A by PCR NEGATIVE NEGATIVE Final   Influenza B by PCR NEGATIVE NEGATIVE Final    Comment: (NOTE) The Xpert Xpress SARS-CoV-2/FLU/RSV plus assay is intended as an aid in the diagnosis of influenza from Nasopharyngeal swab specimens and should not be used as a sole basis for treatment. Nasal washings and aspirates are unacceptable for Xpert Xpress SARS-CoV-2/FLU/RSV testing.  Fact Sheet for Patients: EntrepreneurPulse.com.au  Fact Sheet for Healthcare Providers: IncredibleEmployment.be  This test is not yet approved or cleared by the  [place] FDA and has been authorized for detection and/or diagnosis of SARS-CoV-2 by FDA under an Emergency Use Authorization (EUA). This EUA will remain in effect (meaning this test can be used) for the duration of the COVID-19 declaration under Section 564(b)(1) of the Act, 21 U.S.C. section 360bbb-3(b)(1), unless the authorization is terminated or revoked.  Performed at [facility], [address], [place],  [address]       Studies: No results found.  Scheduled Meds: . amLODipine  10 mg Oral Daily  . atorvastatin  40 mg Oral Daily  . citalopram  20 mg Oral Daily  . clonazePAM  1 mg Oral q AM  . dextromethorphan-guaiFENesin  2 tablet Oral BID  . enoxaparin (LOVENOX) injection  40 mg Subcutaneous Q24H  . famotidine  20 mg Oral Daily  . fenofibrate  160 mg Oral Daily  . furosemide  20 mg Intravenous Daily  . hydrALAZINE  25 mg Oral BID  . insulin aspart  0-15 Units Subcutaneous TID WC  . insulin aspart  0-5 Units Subcutaneous QHS  . insulin aspart  3 Units Subcutaneous TID WC  . ipratropium-albuterol  3 mL Nebulization TID PC  .  levothyroxine  112 mcg Oral QAC breakfast  . losartan  100 mg Oral Daily  . mouth rinse  15 mL Mouth Rinse BID  . metFORMIN  500 mg Oral BID WC  . [START ON 02/25/2020] methylPREDNISolone (SOLU-MEDROL) injection  40 mg Intravenous Q24H  . montelukast  10 mg Oral QHS  . pantoprazole  40 mg Oral BID WC  . pramipexole  0.5 mg Oral BID  . [START ON 02/27/2020] tiotropium  18 mcg Inhalation Daily  . traZODone  150 mg Oral QHS    Continuous Infusions: . sodium chloride    . ampicillin-sulbactam (UNASYN) IV       LOS: 2 days     Kayleen Memos, MD [facility] Pager [phone]  If 7PM-7AM, please contact night-coverage [email or web address] Password [facility] 02/24/2020, 4:51 PM

## 2020-02-24 NOTE — TOC Initial Note (Signed)
Transition of Care [facility]) - Initial/Assessment Note    Patient Details  Name: Kristin [name] MRN: [id number] Date of Birth: 1945/04/13  [facility]) CM/SW Contact:    Candie Chroman, LCSW Phone Number: 02/24/2020, 1:10 PM  Clinical Narrative:   CSW met with patient. Husband at bedside. CSW introduced role and explained that PT recommendations would be discussed. Patient and her husband are agreeable to home health. [facility]. Patient worked with them a few months ago. Referral made to their representative. Patient has a walker (no wheels), cane, shower chair, and bedside commode. Will be unable to order a rolling walker since patient recently got her walker. Husband looking into buying one. Patient on oxygen at home through [place]. No further concerns. CSW encouraged patient and her husband to contact CSW as needed. CSW will continue to follow patient and her husband for support and facilitate return home when stable. Per MD, likely discharge tomorrow.                Expected Discharge Plan: [place] Barriers to Discharge: Continued Medical Work up   Patient Goals and CMS Choice     Choice offered to / list presented to : Patient  Expected Discharge Plan and Services Expected Discharge Plan: [place] Acute Care Choice: Long arrangements for the past 2 months: [place]: RN,PT,OT,Nurse's Aide [place] Agency: [place] ([place]) Date Newborn: 02/24/20   Representative spoke with at [place]: Floydene Flock  Prior Living Arrangements/Services Living arrangements for the past 2 months: Single Family Home Lives with:: Spouse Patient language and need for interpreter reviewed:: Yes Do you feel safe going back to the place where you live?: Yes      Need for Family Participation in Patient Care: Yes (Comment) Care  giver support system in place?: Yes (comment) Current home services: DME Criminal Activity/Legal Involvement Pertinent to Current Situation/Hospitalization: No - Comment as needed  Activities of Daily Living Home Assistive Devices/Equipment: Gilford Rile (specify type) ADL Screening (condition at time of admission) Patient's cognitive ability adequate to safely complete daily activities?: Yes Is the patient deaf or have difficulty hearing?: No Does the patient have difficulty seeing, even when wearing glasses/contacts?: No Does the patient have difficulty concentrating, remembering, or making decisions?: No Patient able to express need for assistance with ADLs?: Yes Does the patient have difficulty dressing or bathing?: No Independently performs ADLs?: Yes (appropriate for developmental age) Does the patient have difficulty walking or climbing stairs?: Yes Weakness of Legs: Both Weakness of Arms/Hands: None  Permission Sought/Granted Permission sought to share information with : Facility Contact Representative,Family Supports Permission granted to share information with : Yes, Verbal Permission Granted  Share Information with NAME: Ruta Capece  Permission granted to share info w AGENCY: [place] granted to share info w Relationship: Spouse  Permission granted to share info w Contact Information: [phone]  Emotional Assessment Appearance:: Appears stated age Attitude/Demeanor/Rapport: Engaged,Gracious Affect (typically observed): Accepting,Appropriate,Calm,Pleasant Orientation: : Oriented to Self,Oriented to Place,Oriented to  Time,Oriented to Situation Alcohol / Substance Use: Not Applicable Psych Involvement: No (comment)  Admission diagnosis:  Shortness of breath [R06.02] Hypoxia [R09.02] COPD exacerbation (HCC) [J44.1] Acute exacerbation of chronic obstructive pulmonary disease (COPD) (  [place]) [J44.1] Patient Active Problem List   Diagnosis Date Noted  .  Acute exacerbation of chronic obstructive pulmonary disease (COPD) ([place]) 02/22/2020  . RLQ abdominal pain 07/22/2019  . COPD exacerbation ([place]) 07/19/2019  . Acute respiratory failure due to COVID-19 ([place]) 05/30/2019  . Carotid stenosis 10/26/2017  . Diabetes ([place]) 10/26/2017  . Essential hypertension 10/26/2017  . COPD (chronic obstructive pulmonary disease) ([place]) 10/26/2017  . Pyelonephritis 09/02/2017  . UTI (urinary tract infection) 11/30/2016  . Encephalopathy acute 11/30/2016  . Chronic respiratory failure with hypoxia ([place]) 11/30/2016  . Weakness generalized 11/30/2016  . Acute encephalopathy 11/30/2016  . Acute respiratory failure ([place]) 05/23/2016  . Malnutrition of moderate degree 05/23/2016  . Syncope 10/23/2015   PCP:  Tracie Harrier, MD Pharmacy:   [facility] [address] [place] [address] [place] [address] Phone: [phone] Fax: [phone]     Social Determinants of Health (SDOH) Interventions    Readmission Risk Interventions Readmission Risk Prevention Plan 06/03/2019  Transportation Screening Complete  PCP or Specialist Appt within 3-5 Days Patient refused  [place] or Home Care Consult Complete  Medication Review (RN Care Manager) Complete  Some recent data might be hidden

## 2020-02-24 NOTE — Care Management Important Message (Signed)
Important Message  Patient Details  Name: Kristin [name] MRN: [id number] Date of Birth: 10-Jun-1945   Medicare Important Message Given:  Yes     Dannette Barbara 02/24/2020, 1:35 PM

## 2020-02-24 NOTE — TOC Transition Note (Signed)
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  The patient has tried other standard treatments such as chest physiotherapy (CPT). However, the patient does not have a caregiver that is available to provide adequate CPT for 30 minutes, twice a day, 365 days a year.  Patient would benefit from HFCWO (High Frequency Chest Wall Oscillation) Vest Therapy.       Barriers to Discharge: Continued Medical Work up   Patient Goals and CMS Choice     Choice offered to / list presented to : Patient  Discharge Placement                       Discharge Plan and Services     Post Acute Care Choice: [place]: RN,PT,OT,Nurse's Aide [place]: [place] ([place]) Date [place]: 02/24/20   Representative spoke with at [place]: Floydene Flock  Social Determinants of Health (SDOH) Interventions     Readmission Risk Interventions Readmission Risk Prevention Plan 06/03/2019  Transportation Screening Complete  PCP or Specialist Appt within 3-5 Days Patient refused  [place] or [place] Complete  Medication Review (RN Care Manager) Complete  Some recent data might be hidden

## 2020-02-24 NOTE — Progress Notes (Signed)
Pharmacy Antibiotic Note  Kristin [name] is a 74 y.o. female with PMH COPD, HTN, DM, CVA admitted on 02/21/2020 with a COPD exacerbation and now with aspiration pneumonia.  Pharmacy has been consulted for Unasyn dosing. Her renal function is currently stable at what appears to be her baseline level.  Plan:  Start Unasyn 3 grams IV every 6 hours  Height: 5\' 4"  (162.6 cm) Weight: 59 kg (130 lb) IBW/kg (Calculated) : 54.7  Temp (24hrs), Avg:98 F (36.7 C), Min:97.6 F (36.4 C), Max:98.4 F (36.9 C)  Recent Labs  Lab 02/19/20 1718 02/21/20 1257 02/22/20 0359 02/24/20 0634  WBC 7.3 11.4* 7.5  --   CREATININE 1.06* 1.03* 0.85 0.89  LATICACIDVEN  --  1.4  --   --     Estimated Creatinine Clearance: 47.9 mL/min (by C-G formula based on SCr of 0.89 mg/dL).    Antimicrobials this admission: azithromycin 12/09 >> 12/10 ceftriaxone 12/07 >> 12/10 Unasyn 12/10 >>  Microbiology results: 12/07 SARS CoV-2: negative 12/07 influenza A/B: negative  Thank you for allowing pharmacy to be a part of this patient's care.  Dallie Piles 02/24/2020 2:17 PM

## 2020-02-25 LAB — GLUCOSE, CAPILLARY
Glucose-Capillary: 121 mg/dL — ABNORMAL HIGH (ref 70–99)
Glucose-Capillary: 226 mg/dL — ABNORMAL HIGH (ref 70–99)
Glucose-Capillary: 120 mg/dL — ABNORMAL HIGH (ref 70–99)
Glucose-Capillary: 141 mg/dL — ABNORMAL HIGH (ref 70–99)

## 2020-02-25 MED ORDER — AMOXICILLIN-POT CLAVULANATE 875-125 MG PO TABS
1.0000 | ORAL_TABLET | Freq: Two times a day (BID) | ORAL | Status: DC
  Administered 2020-02-25 – 2020-02-26 (×3): 1 via ORAL
  Filled 2020-02-25 (×3): qty 1

## 2020-02-25 MED ORDER — LEVOTHYROXINE SODIUM 112 MCG PO TABS
112.0000 ug | ORAL_TABLET | Freq: Every day | ORAL | Status: DC
  Administered 2020-02-26: 07:00:00 112 ug via ORAL
  Filled 2020-02-25: qty 1

## 2020-02-25 MED ORDER — PREDNISONE 20 MG PO TABS
50.0000 mg | ORAL_TABLET | Freq: Every day | ORAL | Status: DC
  Administered 2020-02-26: 09:00:00 50 mg via ORAL
  Filled 2020-02-25 (×2): qty 3

## 2020-02-25 NOTE — Progress Notes (Signed)
Pulmonary Medicine          Date: 02/25/2020,   MRN# [id number] Kristin [name] [facility] 11/30/1945     AdmissionWeight: 59 kg                 CurrentWeight: 59 kg   Referring physician: Dr. Nevada Crane   CHIEF COMPLAINT:   Increased O2 requirement with failure to wean   HISTORY OF PRESENT ILLNESS   Pleasant 74 year old female with history of advanced COPD and chronic hypoxemia using 4 L nasal cannula supplemental oxygen at baseline.  Also has a history of CVA and diabetes essential hypertension came into the hospital with worsening dyspnea increased phlegm production and discoloration from suspicious for acute exacerbation of COPD.  Despite using rescue inhaler numerous times as well as her nebulizer she continued to get worse which required ED evaluation.  Additionally she complained of dysuria upon presentation.  Pulmonary consultation was placed due to failure to wean oxygen therapy.  Most recent chest x-ray on 12 5 evaluated by me showing emphysematous lungs with eventration of the right hemidiaphragm however absence of pulmonary edema, infiltrate to suggest pneumonia, pneumothorax.   02/25/20- patient is improved.  She had Chest PT with metaneb staes she was able to cough out some debris.  She is walking around feels close to baseline. Currently at 4L/min [place] home setting.   PAST MEDICAL HISTORY   Past Medical History:  Diagnosis Date  . Asthma   . Cancer [facility]) 2010   Left ear cancer  . COPD (chronic obstructive pulmonary disease) ([place])   . COVID-19 05/27/2019  . Diabetes mellitus without complication ([place])   . Emphysema lung ([place])   . Hypertension   . Stroke [facility])      SURGICAL HISTORY   Past Surgical History:  Procedure Laterality Date  . ABDOMINAL HYSTERECTOMY    . BREAST BIOPSY Left 1990   neg  . CHOLECYSTECTOMY    . COLONOSCOPY WITH PROPOFOL N/A 12/01/2017   Procedure: COLONOSCOPY WITH PROPOFOL;  Surgeon: Toledo, Benay Pike, MD;  Location: [facility]  ENDOSCOPY;  Service: Gastroenterology;  Laterality: N/A;  . ESOPHAGOGASTRODUODENOSCOPY N/A 12/01/2017   Procedure: ESOPHAGOGASTRODUODENOSCOPY (EGD);  Surgeon: Toledo, Benay Pike, MD;  Location: [facility] ENDOSCOPY;  Service: Gastroenterology;  Laterality: N/A;  . NECK SURGERY       FAMILY HISTORY   Family History  Problem Relation Age of Onset  . Ovarian cancer Mother   . Lung cancer Father   . Breast cancer Paternal Aunt      SOCIAL HISTORY   Social History   Tobacco Use  . Smoking status: Former Research scientist (life sciences)  . Smokeless tobacco: Never Used  Vaping Use  . Vaping Use: Never used  Substance Use Topics  . Alcohol use: No  . Drug use: No     MEDICATIONS    Home Medication:    Current Medication:  Current Facility-Administered Medications:  .  0.9 %  sodium chloride infusion, , Intravenous, PRN, Kayleen Memos, DO .  acetaminophen (TYLENOL) tablet 325 mg, 325 mg, Oral, Q6H PRN, 325 mg at 02/24/20 2112 **OR** acetaminophen (TYLENOL) suppository 650 mg, 650 mg, Rectal, Q6H PRN, Cox, Amy N, DO .  albuterol (VENTOLIN HFA) 108 (90 Base) MCG/ACT inhaler 2 puff, 2 puff, Inhalation, Q4H PRN, Cox, Amy N, DO, 2 puff at 02/22/20 2035 .  amLODipine (NORVASC) tablet 10 mg, 10 mg, Oral, Daily, Cox, Amy N, DO, 10 mg at 02/25/20 0838 .  Ampicillin-Sulbactam (UNASYN) 3 g in sodium  chloride 0.9 % 100 mL IVPB, 3 g, Intravenous, Q6H, Dallie Piles, RPH, Last Rate: 200 mL/hr at 02/25/20 0641, Infusion Verify at 02/25/20 0641 .  atorvastatin (LIPITOR) tablet 40 mg, 40 mg, Oral, Daily, Lu Duffel, RPH, 40 mg at 02/25/20 4235 .  citalopram (CELEXA) tablet 20 mg, 20 mg, Oral, Daily, Cox, Amy N, DO, 20 mg at 02/25/20 0841 .  clonazePAM (KLONOPIN) tablet 1 mg, 1 mg, Oral, q AM, Cox, Amy N, DO, 1 mg at 02/25/20 0603 .  dextromethorphan-guaiFENesin (MUCINEX DM) 30-600 MG per 12 hr tablet 2 tablet, 2 tablet, Oral, BID, Kayleen Memos, DO, 2 tablet at 02/25/20 0840 .  enoxaparin (LOVENOX) injection 40  mg, 40 mg, Subcutaneous, Q24H, Cox, Amy N, DO, 40 mg at 02/24/20 2112 .  famotidine (PEPCID) tablet 20 mg, 20 mg, Oral, Daily, Cox, Amy N, DO, 20 mg at 02/25/20 0838 .  fenofibrate tablet 160 mg, 160 mg, Oral, Daily, Cox, Amy N, DO, 160 mg at 02/25/20 0840 .  furosemide (LASIX) injection 20 mg, 20 mg, Intravenous, Daily, Ottie Glazier, MD, 20 mg at 02/25/20 0842 .  hydrALAZINE (APRESOLINE) tablet 25 mg, 25 mg, Oral, BID, Cox, Amy N, DO, 25 mg at 02/25/20 0839 .  insulin aspart (novoLOG) injection 0-15 Units, 0-15 Units, Subcutaneous, TID WC, Cox, Amy N, DO, 2 Units at 02/25/20 0842 .  insulin aspart (novoLOG) injection 0-5 Units, 0-5 Units, Subcutaneous, QHS, Cox, Amy N, DO, 5 Units at 02/21/20 2211 .  insulin aspart (novoLOG) injection 3 Units, 3 Units, Subcutaneous, TID WC, Kayleen Memos, DO, 3 Units at 02/25/20 [phone] .  ipratropium-albuterol (DUONEB) 0.5-2.5 (3) MG/3ML nebulizer solution 3 mL, 3 mL, Nebulization, TID PC, Cox, Amy N, DO, 3 mL at 02/25/20 0856 .  levothyroxine (SYNTHROID) tablet 112 mcg, 112 mcg, Oral, QAC breakfast, Cox, Amy N, DO, 112 mcg at 02/25/20 0839 .  loperamide (IMODIUM) capsule 2 mg, 2 mg, Oral, PRN, Kayleen Memos, DO, 2 mg at 02/23/20 0902 .  losartan (COZAAR) tablet 100 mg, 100 mg, Oral, Daily, Cox, Amy N, DO, 100 mg at 02/25/20 0840 .  MEDLINE mouth rinse, 15 mL, Mouth Rinse, BID, Cox, Amy N, DO, 15 mL at 02/24/20 2114 .  metFORMIN (GLUCOPHAGE) tablet 500 mg, 500 mg, Oral, BID WC, Cox, Amy N, DO, 500 mg at 02/25/20 0844 .  methylPREDNISolone sodium succinate (SOLU-MEDROL) 40 mg/mL injection 40 mg, 40 mg, Intravenous, Q24H, Lanney Gins, Tiana Sivertson, MD, 40 mg at 02/25/20 0842 .  montelukast (SINGULAIR) tablet 10 mg, 10 mg, Oral, QHS, Cox, Amy N, DO, 10 mg at 02/24/20 2112 .  ondansetron (ZOFRAN) tablet 4 mg, 4 mg, Oral, Q6H PRN **OR** ondansetron (ZOFRAN) injection 4 mg, 4 mg, Intravenous, Q6H PRN, Cox, Amy N, DO .  pantoprazole (PROTONIX) EC tablet 40 mg, 40 mg, Oral, BID  WC, Cox, Amy N, DO, 40 mg at 02/25/20 0839 .  pramipexole (MIRAPEX) tablet 0.5 mg, 0.5 mg, Oral, BID, Dallie Piles, RPH, 0.5 mg at 02/25/20 4315 .  [START ON 02/27/2020] tiotropium (SPIRIVA) inhalation capsule ([facility] use ONLY) 18 mcg, 18 mcg, Inhalation, Daily, Dallie Piles, RPH .  traZODone (DESYREL) tablet 150 mg, 150 mg, Oral, QHS, Cox, Amy N, DO, 150 mg at 02/24/20 2111    ALLERGIES   Codeine, Morphine and related, and Prednisone     REVIEW OF SYSTEMS    Review of Systems:  Gen:  Denies  fever, sweats, chills weigh loss  HEENT: Denies blurred vision, double vision,  ear pain, eye pain, hearing loss, nose bleeds, sore throat Cardiac:  No dizziness, chest pain or heaviness, chest tightness,edema Resp:   Denies cough or sputum porduction, shortness of breath,wheezing, hemoptysis,  Gi: Denies swallowing difficulty, stomach pain, nausea or vomiting, diarrhea, constipation, bowel incontinence Gu:  Denies bladder incontinence, burning urine Ext:   Denies Joint pain, stiffness or swelling Skin: Denies  skin rash, easy bruising or bleeding or hives Endoc:  Denies polyuria, polydipsia , polyphagia or weight change Psych:   Denies depression, insomnia or hallucinations   Other:  All other systems negative   VS: BP (!) 136/55 (BP Location: Right Arm)   Pulse 73   Temp 97.9 F (36.6 C) (Oral)   Resp 20   Ht 5\' 4"  (1.626 m)   Wt 59 kg   SpO2 98%   BMI 22.31 kg/m      PHYSICAL EXAM    GENERAL:NAD, no fevers, chills, no weakness no fatigue HEAD: Normocephalic, atraumatic.  EYES: Pupils equal, round, reactive to light. Extraocular muscles intact. No scleral icterus.  MOUTH: Moist mucosal membrane. Dentition intact. No abscess noted.  EAR, NOSE, THROAT: Clear without exudates. No external lesions.  NECK: Supple. No thyromegaly. No nodules. No JVD.  PULMONARY: clear to auscultation bilatearlly with decreased breaths sounds CARDIOVASCULAR: S1 and S2. Regular rate and  rhythm. No murmurs, rubs, or gallops. No edema. Pedal pulses 2+ bilaterally.  GASTROINTESTINAL: Soft, nontender, nondistended. No masses. Positive bowel sounds. No hepatosplenomegaly.  MUSCULOSKELETAL: No swelling, clubbing, or edema. Range of motion full in all extremities.  NEUROLOGIC: Cranial nerves II through XII are intact. No gross focal neurological deficits. Sensation intact. Reflexes intact.  SKIN: No ulceration, lesions, rashes, or cyanosis. Skin warm and dry. Turgor intact.  PSYCHIATRIC: Mood, affect within normal limits. The patient is awake, alert and oriented x 3. Insight, judgment intact.       IMAGING    DG Chest 2 View  Result Date: 02/19/2020 CLINICAL DATA:  Short of breath.  COPD. EXAM: CHEST - 2 VIEW COMPARISON:  07/19/2019 FINDINGS: Cardiac silhouette is normal in size. No mediastinal or hilar masses or evidence of adenopathy. Lungs are hyperexpanded. Linear stable scarring noted at the lung bases. Lungs otherwise clear. No pleural effusion or pneumothorax. Skeletal structures are demineralized, but intact. Stable changes from a previous anterior cervical spine fusion. IMPRESSION: 1. No acute cardiopulmonary disease. 2. COPD.  Chronic lung base scarring. Electronically Signed   By: Lajean Manes M.D.   On: 02/19/2020 18:09   DG Chest Portable 1 View  Result Date: 02/21/2020 CLINICAL DATA:  COPD exacerbation on BiPAP. EXAM: PORTABLE CHEST 1 VIEW COMPARISON:  02/19/2020 FINDINGS: The cardiomediastinal silhouette is within normal limits. Aortic atherosclerosis is noted. Emphysema and mild bibasilar lung scarring are again noted. No acute airspace consolidation, edema, pleural effusion, or pneumothorax is identified. Prior anterior cervical fusion is noted. IMPRESSION: No active disease. Electronically Signed   By: Logan Bores M.D.   On: 02/21/2020 13:38   MM 3D SCREEN BREAST BILATERAL  Result Date: 02/12/2020 CLINICAL DATA:  Screening. EXAM: DIGITAL SCREENING BILATERAL  MAMMOGRAM WITH TOMO AND CAD COMPARISON:  Previous exam(s). ACR Breast Density Category b: There are scattered areas of fibroglandular density. FINDINGS: There are no findings suspicious for malignancy. Images were processed with CAD. IMPRESSION: No mammographic evidence of malignancy. A result letter of this screening mammogram will be mailed directly to the patient. RECOMMENDATION: Screening mammogram in one year. (Code:SM-B-01Y) BI-RADS CATEGORY  1: Negative. Electronically Signed  By: Lajean Manes M.D.   On: 02/12/2020 13:55      ASSESSMENT/PLAN   Acute on chronic hypoxemic respiratory failure -Due to acute exacerbation of COPD -patient reports aspiration of food on her birthday last week- "choked to death" she states after having esophagus dilated x3 she has regurgitation.  We discussed follow up with GI who she is established with.   -Aspiration is likely cause of her exacerbation of COPD  -will order barium swallow on outpatient basis post d/c - She is close to baseline on her O2 requirement - currently on 4L which is what she uses at home -will need to start MetaNEB x 1 day for recruitment maneuvers.  -can d/c hypertonic saline -decreased solumedrol to once daily IV 40>>> prednisone 50 -02/25/20- -will order legionella ab due to diarreah with resp distress -changed abx from Unasyn>>Augmentin 4 days more -from pulmonary perspective may initiate dc planning   Thank you for allowing me to participate in the care of this patient.   Patient/Family are satisfied with care plan and all questions have been answered.   This document was prepared using Dragon voice recognition software and may include unintentional dictation errors.     Ottie Glazier, M.D.  Division of [place]

## 2020-02-25 NOTE — Progress Notes (Signed)
PROGRESS NOTE  Kristin [name] QQV:[id number] DOB: 07-18-1945 DOA: 02/21/2020 PCP: Tracie Harrier, MD  HPI/Recap of past 24 hours: Kristin [name] is a 74 y.o. 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  She reports the shortness of breath since 02/17/20 and has worsened.  She presented to the emergency department and was given conservative treatments on 02/19/2020.  She was given duo nebs and Keflex 500 mg twice daily.  She was not given steroids due to history of diabetes mellitus.  She states she uses her nebulizer twice per day and tried her rescue inhaler without symptom improvement.  She denies sick contact. She feels she needs to cough up plegm but is unable to cough it up.  She endorsed a brief headache that is improved since being in the ED. She denies fever, chills, chest pain, abdominal pain, dysuria, hematuria.  She endorsed dysuria and urgency 3 days ago and was prescribed antibiotics on 02/19/2020.  She endorses diarrhea with loose stool twice.  She endorsed nausea and vomiting x 1 on 02/20/20.  She denies blood in stool.  She endorses some swelling in her lower extremities.   ED Course: Discussed with ED provider, requesting admission for COPD exacerbation failed outpatient conservative therapy.  Pulmonary consulted to further assess.  She had reported aspiration of food on her birthday last [birth year]. Speech therapy was consulted to further assess.  02/25/20: Seen and examined at her bedside this morning. Her husband was not present. She reports feeling better this morning. Seen by pulmonology, Unasyn switched to Augmentin x4 more days. Seen by speech therapies, started on dysphagia 3 diet. Will decide whether or  not MBS is required.  Assessment/Plan: Principal Problem:   COPD exacerbation (HCC) Active Problems:   Acute respiratory failure (HCC)   UTI (urinary tract infection)   Chronic respiratory failure with hypoxia (HCC)   Weakness generalized   Diabetes ([place])   Essential hypertension   Acute exacerbation of chronic obstructive pulmonary disease (COPD) ([place])  Acute COPD exacerbation with suspected aspiration and possible dysphagia Presented with worsening dyspnea No evidence of active disease on chest x-ray Admitted for acute COPD exacerbation, started on IV Solu-Medrol Weaned off IV Solu-Medrol, started on prednisone by pulmonary on 02/25/2020. Unasyn switched to Augmentin x4 more days per pulmonary. Continue pulmonary toilet, Mucinex 1200 mg twice daily Hypersaline nebs discontinued per pulmonary.  Continue bronchodilators, incentive spirometer and flutter valve Continue to maintain O2 saturation greater than 90%  Suspected aspiration pneumonia Patient has reported choking on food intermittently Seen by speech therapist with recommendation for dysphagia 3 diet She will reassess and decide on whether or not MBS is required. Continue aspiration precautions Started on Unasyn on 02/24/2020, switched to Augmentin on 02/25/2020.  Acute on chronic hypoxic respiratory failure She is on 4 L nasal cannula at baseline continuously She is currently back to her baseline, on 4 L with O2 saturation of 100%. Initially presented with acute on chronic hypoxia requiring BiPAP. Pulmonary following, appreciate recommendations.  Intermittent loose stools, chronically Legionella ab ordered by pulmonary in the setting of diarrhea with respiratory distress, follow results. Currently on home Imodium as needed Afebrile.  Presumptive UTI UA positive for pyuria on 02/19/2020 No urine culture or blood cultures data. Received 3 days of IV Rocephin  Resolved chronic intermittent loose stools  Chronic per  the patient, she states she takes Imodium as needed at home Afebrile with no leukocytosis Continue Imodium as needed She denies any recurrent frequent stools at the time of this exam.  Chronic anxiety/depression Continue home regimen.  Type 2 diabetes with hyperglycemia Hemoglobin A1c 6.8 on 02/21/2020 Continue insulin sliding scale  Chronic normocytic anemia Baseline hemoglobin appears to be 11 Hemoglobin 10.2 No overt bleeding Continue to monitor H&H  Chronic diastolic CHF Last 2D echo done on 05/24/2016 showed normal LVEF Euvolemic on exam Continue strict I's and O's and daily weight  GERD Continue home regimen she is on PPI twice daily  Hyperlipidemia Continue home regimen  History of COVID-19 pneumonia 05/27/19 Continue current management  Ambulatory dysfunction PT assessment recommended home health PT OT DME rolling walker Continue PT with assistance and fall precautions.   Code Status: Full code  Family Communication: Husband was not present at bedside on 02/25/2020.  Disposition Plan: Likely discharge to home in 02/26/2020   Consultants: Pulmonary Procedures:  None  Antimicrobials:  Rocephin 02/19/20>> 02/24/2020  Z-Pak added on 02/23/2020  Unasyn 02/24/2020>>> 02/25/2020  Augmentin 02/25/2020>>>  DVT prophylaxis: Subcu Lovenox daily  Status is: Inpatient    Dispo:  Patient From: Home  Planned Disposition: Home with home health PT OT  Expected discharge date: 02/26/20  Medically stable for discharge: No, ongoing management of COPD exacerbation and possible aspiration pneumonia.        Objective: Vitals:   02/25/20 0018 02/25/20 0446 02/25/20 0834 02/25/20 1131  BP: (!) 140/55 130/60 (!) 136/55 (!) 135/53  Pulse: 70 70 73 75  Resp: 16 12 20 16   Temp: 97.9 F (36.6 C) 97.8 F (36.6 C) 97.9 F (36.6 C) 97.9 F (36.6 C)  TempSrc: Oral Oral Oral   SpO2: 99% 100% 98% 93%  Weight:      Height:        Intake/Output Summary  (Last 24 hours) at 02/25/2020 1512 Last data filed at 02/25/2020 1358 Gross per 24 hour  Intake 1242.71 ml  Output --  Net 1242.71 ml   Filed Weights   02/21/20 1312  Weight: 59 kg    Exam:  . General: 74 y.o. year-old female chronically ill-appearing in no acute distress. She is alert and oriented x3.  . Cardiovascular: Regular rate and rhythm no rubs or gallops. Marland Kitchen Respiratory: Mild rales noted at bases. No wheezing noted. She has poor inspiratory effort.  . Abdomen: Soft nontender normal bowel sounds present. Musculoskeletal: No lower extremity edema bilaterally.   Marland Kitchen Psychiatry: Mood is appropriate for condition and setting..  Data Reviewed: CBC: Recent Labs  Lab 02/19/20 1718 02/21/20 1257 02/22/20 0359  WBC 7.3 11.4* 7.5  NEUTROABS  --  6.6  --   HGB 11.7* 11.0* 10.2*  HCT 35.4* 34.5* 31.4*  MCV 95.4 95.8 96.3  PLT 205 202 703   Basic Metabolic Panel: Recent Labs  Lab 02/19/20 1718 02/21/20 1257 02/22/20 0359 02/24/20 0634  NA 137 137 140 136  K 4.5 3.9 4.2 4.2  CL 102 103 106 101  CO2 27 23 27 26   GLUCOSE 139* 186* 132* 166*  BUN 24* 22 21 21   CREATININE 1.06* 1.03* 0.85 0.89  CALCIUM 10.0 9.2 8.7* 8.9  MG  --  3.5*  --  2.1   GFR: Estimated Creatinine Clearance: 47.9 mL/min (by C-G formula based on SCr of 0.89 mg/dL). Liver Function Tests: Recent Labs  Lab 02/21/20 1257  AST 16  ALT 16  ALKPHOS  39  BILITOT 0.8  PROT 6.7  ALBUMIN 4.3   No results for input(s): LIPASE, AMYLASE in the last 168 hours. No results for input(s): AMMONIA in the last 168 hours. Coagulation Profile: No results for input(s): INR, PROTIME in the last 168 hours. Cardiac Enzymes: No results for input(s): CKTOTAL, CKMB, CKMBINDEX, TROPONINI in the last 168 hours. BNP (last 3 results) No results for input(s): PROBNP in the last 8760 hours. HbA1C: No results for input(s): HGBA1C in the last 72 hours. CBG: Recent Labs  Lab 02/24/20 1215 02/24/20 1648 02/24/20 2017  02/25/20 0753 02/25/20 1215  GLUCAP 113* 212* 123* 121* 226*   Lipid Profile: No results for input(s): CHOL, HDL, LDLCALC, TRIG, CHOLHDL, LDLDIRECT in the last 72 hours. Thyroid Function Tests: No results for input(s): TSH, T4TOTAL, FREET4, T3FREE, THYROIDAB in the last 72 hours. Anemia Panel: No results for input(s): VITAMINB12, FOLATE, FERRITIN, TIBC, IRON, RETICCTPCT in the last 72 hours. Urine analysis:    Component Value Date/Time   COLORURINE YELLOW (A) 02/19/2020 2000   APPEARANCEUR HAZY (A) 02/19/2020 2000   APPEARANCEUR Clear 03/18/2013 1319   LABSPEC 1.016 02/19/2020 2000   LABSPEC 1.005 03/18/2013 1319   PHURINE 6.0 02/19/2020 2000   GLUCOSEU NEGATIVE 02/19/2020 2000   GLUCOSEU Negative 03/18/2013 1319   HGBUR NEGATIVE 02/19/2020 2000   BILIRUBINUR NEGATIVE 02/19/2020 2000   BILIRUBINUR Negative 03/18/2013 1319   KETONESUR NEGATIVE 02/19/2020 2000   PROTEINUR NEGATIVE 02/19/2020 2000   UROBILINOGEN 1.0 06/24/2008 1545   NITRITE NEGATIVE 02/19/2020 2000   LEUKOCYTESUR SMALL (A) 02/19/2020 2000   LEUKOCYTESUR 1+ 03/18/2013 1319   Sepsis Labs: @LABRCNTIP (procalcitonin:4,lacticidven:4)  ) Recent Results (from the past 240 hour(s))  Resp Panel by RT-PCR (Flu A&B, Covid) Nasopharyngeal Swab     Status: None   Collection Time: 02/19/20  7:18 PM   Specimen: Nasopharyngeal Swab; Nasopharyngeal(NP) swabs in vial transport medium  Result Value Ref Range Status   SARS Coronavirus 2 by RT PCR NEGATIVE NEGATIVE Final    Comment: (NOTE) SARS-CoV-2 target nucleic acids are NOT DETECTED.  The SARS-CoV-2 RNA is generally detectable in upper respiratory specimens during the acute phase of infection. The lowest concentration of SARS-CoV-2 viral copies this assay can detect is 138 copies/mL. A negative result does not preclude SARS-Cov-2 infection and should not be used as the sole basis for treatment or other patient management decisions. A negative result may occur with   improper specimen collection/handling, submission of specimen other than nasopharyngeal swab, presence of viral mutation(s) within the areas targeted by this assay, and inadequate number of viral copies(<138 copies/mL). A negative result must be combined with clinical observations, patient history, and epidemiological information. The expected result is Negative.  Fact Sheet for Patients:  EntrepreneurPulse.com.au  Fact Sheet for Healthcare Providers:  IncredibleEmployment.be  This test is no t yet approved or cleared by the [place] FDA and  has been authorized for detection and/or diagnosis of SARS-CoV-2 by FDA under an Emergency Use Authorization (EUA). This EUA will remain  in effect (meaning this test can be used) for the duration of the COVID-19 declaration under Section 564(b)(1) of the Act, 21 U.S.C.section 360bbb-3(b)(1), unless the authorization is terminated  or revoked sooner.       Influenza A by PCR NEGATIVE NEGATIVE Final   Influenza B by PCR NEGATIVE NEGATIVE Final    Comment: (NOTE) The Xpert Xpress SARS-CoV-2/FLU/RSV plus assay is intended as an aid in the diagnosis of influenza from Nasopharyngeal swab specimens and should not  be used as a sole basis for treatment. Nasal washings and aspirates are unacceptable for Xpert Xpress SARS-CoV-2/FLU/RSV testing.  Fact Sheet for Patients: EntrepreneurPulse.com.au  Fact Sheet for Healthcare Providers: IncredibleEmployment.be  This test is not yet approved or cleared by the [place] FDA and has been authorized for detection and/or diagnosis of SARS-CoV-2 by FDA under an Emergency Use Authorization (EUA). This EUA will remain in effect (meaning this test can be used) for the duration of the COVID-19 declaration under Section 564(b)(1) of the Act, 21 U.S.C. section 360bbb-3(b)(1), unless the authorization is terminated  or revoked.  Performed at [facility], [address] [place], [place] [address]   Resp Panel by RT-PCR (Flu A&B, Covid) Nasopharyngeal Swab     Status: None   Collection Time: 02/21/20 12:56 PM   Specimen: Nasopharyngeal Swab; Nasopharyngeal(NP) swabs in vial transport medium  Result Value Ref Range Status   SARS Coronavirus 2 by RT PCR NEGATIVE NEGATIVE Final    Comment: (NOTE) SARS-CoV-2 target nucleic acids are NOT DETECTED.  The SARS-CoV-2 RNA is generally detectable in upper respiratory specimens during the acute phase of infection. The lowest concentration of SARS-CoV-2 viral copies this assay can detect is 138 copies/mL. A negative result does not preclude SARS-Cov-2 infection and should not be used as the sole basis for treatment or other patient management decisions. A negative result may occur with  improper specimen collection/handling, submission of specimen other than nasopharyngeal swab, presence of viral mutation(s) within the areas targeted by this assay, and inadequate number of viral copies(<138 copies/mL). A negative result must be combined with clinical observations, patient history, and epidemiological information. The expected result is Negative.  Fact Sheet for Patients:  EntrepreneurPulse.com.au  Fact Sheet for Healthcare Providers:  IncredibleEmployment.be  This test is no t yet approved or cleared by the [place] FDA and  has been authorized for detection and/or diagnosis of SARS-CoV-2 by FDA under an Emergency Use Authorization (EUA). This EUA will remain  in effect (meaning this test can be used) for the duration of the COVID-19 declaration under Section 564(b)(1) of the Act, 21 U.S.C.section 360bbb-3(b)(1), unless the authorization is terminated  or revoked sooner.       Influenza A by PCR NEGATIVE NEGATIVE Final   Influenza B by PCR NEGATIVE NEGATIVE Final    Comment: (NOTE) The Xpert  Xpress SARS-CoV-2/FLU/RSV plus assay is intended as an aid in the diagnosis of influenza from Nasopharyngeal swab specimens and should not be used as a sole basis for treatment. Nasal washings and aspirates are unacceptable for Xpert Xpress SARS-CoV-2/FLU/RSV testing.  Fact Sheet for Patients: EntrepreneurPulse.com.au  Fact Sheet for Healthcare Providers: IncredibleEmployment.be  This test is not yet approved or cleared by the [place] FDA and has been authorized for detection and/or diagnosis of SARS-CoV-2 by FDA under an Emergency Use Authorization (EUA). This EUA will remain in effect (meaning this test can be used) for the duration of the COVID-19 declaration under Section 564(b)(1) of the Act, 21 U.S.C. section 360bbb-3(b)(1), unless the authorization is terminated or revoked.  Performed at [facility], [address], [place], [place] [address]       Studies: No results found.  Scheduled Meds: . amLODipine  10 mg Oral Daily  . amoxicillin-clavulanate  1 tablet Oral Q12H  . atorvastatin  40 mg Oral Daily  . citalopram  20 mg Oral Daily  . clonazePAM  1 mg Oral q AM  . dextromethorphan-guaiFENesin  2 tablet Oral BID  .  enoxaparin (LOVENOX) injection  40 mg Subcutaneous Q24H  . famotidine  20 mg Oral Daily  . fenofibrate  160 mg Oral Daily  . furosemide  20 mg Intravenous Daily  . hydrALAZINE  25 mg Oral BID  . insulin aspart  0-15 Units Subcutaneous TID WC  . insulin aspart  0-5 Units Subcutaneous QHS  . insulin aspart  3 Units Subcutaneous TID WC  . ipratropium-albuterol  3 mL Nebulization TID PC  . [START ON 02/26/2020] levothyroxine  112 mcg Oral Q0600  . losartan  100 mg Oral Daily  . mouth rinse  15 mL Mouth Rinse BID  . metFORMIN  500 mg Oral BID WC  . montelukast  10 mg Oral QHS  . pantoprazole  40 mg Oral BID WC  . pramipexole  0.5 mg Oral BID  . [START ON 02/26/2020] predniSONE  50 mg Oral Q breakfast   . [START ON 02/27/2020] tiotropium  18 mcg Inhalation Daily  . traZODone  150 mg Oral QHS    Continuous Infusions: . sodium chloride       LOS: 3 days     Kayleen Memos, MD [facility] Pager [phone]  If 7PM-7AM, please contact night-coverage [email or web address] Password [email or web address] 02/25/2020, 3:12 PM

## 2020-02-25 NOTE — Progress Notes (Signed)
Bedside swallow eval completed. Full report to follow later today. No s/s of aspiration today but Pt reports occasional choking episodes or sometimes "it takes a while to go down" Today Pt did not report feeling like the food was stuck but that it was sometimes hard to get it to go back to swallow. No oral motor deficits noted. No s/s of aspiration. Possible fear of swallowing or maybe from lack of desire to eat. Will alter diet to Dys 3 with thin liquids. ST to follow to determine if MBSS is indicated.

## 2020-02-25 NOTE — Progress Notes (Signed)
Patient complained of a headache on the posterior left side of her head radiating to the back of her eye. I notified NP about it and she came to see the patient and examined her eyes. Patient given tylenol that offered relief.   Will continue to monitor.  Kristin [name]

## 2020-02-25 NOTE — Evaluation (Signed)
Clinical/Bedside Swallow Evaluation Patient Details  Name: Kristin [name] MRN: [id number] Date of Birth: 1945/04/17  Today's Date: 02/25/2020 Time: SLP Start Time (ACUTE ONLY): 1100 SLP Stop Time (ACUTE ONLY): 1140 SLP Time Calculation (min) (ACUTE ONLY): 40 min  Past Medical History:  Past Medical History:  Diagnosis Date  . Asthma   . 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 COPD (chronic obstructive pulmonary disease) ([place])   . COVID-19 05/27/2019  . Diabetes mellitus without complication ([place])   . Emphysema lung ([place])   . Hypertension   . Stroke [facility])    Past Surgical History:  Past Surgical History:  Procedure Laterality Date  . ABDOMINAL HYSTERECTOMY    . BREAST BIOPSY Left 1990   neg  . CHOLECYSTECTOMY    . COLONOSCOPY WITH PROPOFOL N/A 12/01/2017   Procedure: COLONOSCOPY WITH PROPOFOL;  Surgeon: Toledo, Benay Pike, MD;  Location: [facility] ENDOSCOPY;  Service: Gastroenterology;  Laterality: N/A;  . ESOPHAGOGASTRODUODENOSCOPY N/A 12/01/2017   Procedure: ESOPHAGOGASTRODUODENOSCOPY (EGD);  Surgeon: Toledo, Benay Pike, MD;  Location: [facility] ENDOSCOPY;  Service: Gastroenterology;  Laterality: N/A;  . NECK SURGERY     HPI:  Per admitting H & P "Kristin [name] is a 74 y.o. female with medical history significant for ED on chronic 4 L, chronic respiratory failure requiring baseline nasal cannula, hypertension, non-insulin-dependent diabetes mellitus on Metformin, history of CVA, presented to the emergency department for chief concerns of worsening shortness of breath."   Assessment / Plan / Recommendation Clinical Impression  Bedside swallow eval revealed toleration of all consistencies without s/s of aspiration. Pt reports occasional choking on foods if she is not careful. Today, Pt chewed thoroughly and often hesitated prior to swallowing. She reports sometimes it is difficult to "get food to go down" When further questioned, she admits the difficulty occurs before she  actually swallows. She also reports no appetite or desire to eat. Vocal quality remained clear, laryngeal elevation appeared adequate. Pt reports no sensation of food sticking in throat either. Rec altering diet to Dys 3 for ease of chewing. ST to follow. If any further choking episodes will request MBSS to further assess. SLP Visit Diagnosis: Dysphagia, oropharyngeal phase (R13.12)    Aspiration Risk  Mild aspiration risk    Diet Recommendation Dysphagia 3 (Mech soft);Thin liquid   Medication Administration: Whole meds with puree Supervision: Intermittent supervision to cue for compensatory strategies Compensations: Minimize environmental distractions;Slow rate;Small sips/bites Postural Changes: Seated upright at 90 degrees;Remain upright for at least 30 minutes after po intake    Other  Recommendations     Follow up Recommendations        Frequency and Duration min 2x/week  1 week       Prognosis Prognosis for Safe Diet Advancement: Good      Swallow Study   General HPI: Per admitting H & P "Kristin [name] is a 74 y.o. female with medical history significant for ED on chronic 4 L, chronic respiratory failure requiring baseline nasal cannula, hypertension, non-insulin-dependent diabetes mellitus on Metformin, history of CVA, presented to the emergency department for chief concerns of worsening shortness of breath." Type of Study: Bedside Swallow Evaluation Previous Swallow Assessment: None Diet Prior to this Study: Regular Temperature Spikes Noted: No Respiratory Status: Nasal cannula History of Recent Intubation: No Behavior/Cognition: Alert;Cooperative;Pleasant mood Oral Cavity Assessment: Within Functional Limits Oral Care Completed by SLP: No Oral Cavity - Dentition: Poor condition Vision: Functional for self-feeding Self-Feeding Abilities: Able to feed self Patient  Positioning: Upright in bed Baseline Vocal Quality: Normal Volitional Cough: Strong     Oral/Motor/Sensory Function Overall Oral Motor/Sensory Function: Within functional limits   Ice Chips Ice chips: Within functional limits Presentation: Spoon   Thin Liquid Thin Liquid: Within functional limits Presentation: Cup;Spoon;Straw    Nectar Thick Nectar Thick Liquid: Not tested   Honey Thick     Puree Puree: Within functional limits   Solid     Solid: Within functional limits Presentation: [place] 02/25/2020,4:44 PM

## 2020-02-26 LAB — GLUCOSE, CAPILLARY
Glucose-Capillary: 133 mg/dL — ABNORMAL HIGH (ref 70–99)
Glucose-Capillary: 195 mg/dL — ABNORMAL HIGH (ref 70–99)

## 2020-02-26 MED ORDER — LOPERAMIDE HCL 2 MG PO CAPS: 2.0000 mg | ORAL_CAPSULE | ORAL | 0 refills | Status: DC | PRN

## 2020-02-26 MED ORDER — SACCHAROMYCES BOULARDII 250 MG PO CAPS: 250.0000 mg | ORAL_CAPSULE | Freq: Two times a day (BID) | ORAL | 0 refills | Status: AC

## 2020-02-26 MED ORDER — AMOXICILLIN-POT CLAVULANATE 875-125 MG PO TABS: 1.0000 | ORAL_TABLET | Freq: Two times a day (BID) | ORAL | 0 refills | Status: AC

## 2020-02-26 NOTE — Progress Notes (Signed)
Pulmonary Medicine          Date: 02/26/2020,   MRN# [id number] Kristin [name] [facility] [birth year]     AdmissionWeight: 59 kg                 CurrentWeight: 59 kg   Referring physician: Dr. [place] Crane   CHIEF COMPLAINT:   Increased O2 requirement with failure to wean   HISTORY OF PRESENT ILLNESS   Pleasant 74 year old female with history of advanced COPD and chronic hypoxemia using 4 L nasal cannula supplemental oxygen at baseline.  Also has a history of CVA and diabetes essential hypertension came into the hospital with worsening dyspnea increased phlegm production and discoloration from suspicious for acute exacerbation of COPD.  Despite using rescue inhaler numerous times as well as her nebulizer she continued to get worse which required ED evaluation.  Additionally she complained of dysuria upon presentation.  Pulmonary consultation was placed due to failure to wean oxygen therapy.  Most recent chest x-ray on 12 5 evaluated by me showing emphysematous lungs with eventration of the right hemidiaphragm however absence of pulmonary edema, infiltrate to suggest pneumonia, pneumothorax.   02/25/20- patient is improved.  She had Chest PT with metaneb staes she was able to cough out some debris.  She is walking around feels close to baseline. Currently at 4L/min [place] home setting.  02/26/20- patient is further improved. She is clear to auscultation and optimized for DC home. We discussed outpatient follow up and will be seeing her within 7 days of follow up.    PAST MEDICAL HISTORY   Past Medical History:  Diagnosis Date  . Asthma   . Cancer [facility]) 2010   Left ear cancer  . COPD (chronic obstructive pulmonary disease) ([place])   . COVID-19 05/27/2019  . Diabetes mellitus without complication ([place])   . Emphysema lung ([place])   . Hypertension   . Stroke [facility])      SURGICAL HISTORY   Past Surgical History:  Procedure Laterality Date  . ABDOMINAL HYSTERECTOMY    . BREAST  BIOPSY Left 1990   neg  . CHOLECYSTECTOMY    . COLONOSCOPY WITH PROPOFOL N/A 12/01/2017   Procedure: COLONOSCOPY WITH PROPOFOL;  Surgeon: Toledo, Benay Pike, MD;  Location: [facility] ENDOSCOPY;  Service: Gastroenterology;  Laterality: N/A;  . ESOPHAGOGASTRODUODENOSCOPY N/A 12/01/2017   Procedure: ESOPHAGOGASTRODUODENOSCOPY (EGD);  Surgeon: Toledo, Benay Pike, MD;  Location: [facility] ENDOSCOPY;  Service: Gastroenterology;  Laterality: N/A;  . NECK SURGERY       FAMILY HISTORY   Family History  Problem Relation Age of Onset  . Ovarian cancer Mother   . Lung cancer Father   . Breast cancer Paternal Aunt      SOCIAL HISTORY   Social History   Tobacco Use  . Smoking status: Former Research scientist (life sciences)  . Smokeless tobacco: Never Used  Vaping Use  . Vaping Use: Never used  Substance Use Topics  . Alcohol use: No  . Drug use: No     MEDICATIONS    Home Medication:    Current Medication:  Current Facility-Administered Medications:  .  0.9 %  sodium chloride infusion, , Intravenous, PRN, Kayleen Memos, DO .  acetaminophen (TYLENOL) tablet 325 mg, 325 mg, Oral, Q6H PRN, 325 mg at 02/24/20 2112 **OR** acetaminophen (TYLENOL) suppository 650 mg, 650 mg, Rectal, Q6H PRN, Cox, Amy N, DO .  albuterol (VENTOLIN HFA) 108 (90 Base) MCG/ACT inhaler 2 puff, 2 puff, Inhalation, Q4H PRN, Cox, Amy N, DO,  2 puff at 02/22/20 2035 .  amLODipine (NORVASC) tablet 10 mg, 10 mg, Oral, Daily, Cox, Amy N, DO, 10 mg at 02/26/20 0834 .  amoxicillin-clavulanate (AUGMENTIN) 875-125 MG per tablet 1 tablet, 1 tablet, Oral, Q12H, Ottie Glazier, MD, 1 tablet at 02/26/20 0831 .  atorvastatin (LIPITOR) tablet 40 mg, 40 mg, Oral, Daily, Lu Duffel, RPH, 40 mg at 02/26/20 0830 .  citalopram (CELEXA) tablet 20 mg, 20 mg, Oral, Daily, Cox, Amy N, DO, 20 mg at 02/26/20 0835 .  clonazePAM (KLONOPIN) tablet 1 mg, 1 mg, Oral, q AM, Cox, Amy N, DO, 1 mg at 02/26/20 0647 .  enoxaparin (LOVENOX) injection 40 mg, 40 mg,  Subcutaneous, Q24H, Cox, Amy N, DO, 40 mg at 02/25/20 2230 .  famotidine (PEPCID) tablet 20 mg, 20 mg, Oral, Daily, Cox, Amy N, DO, 20 mg at 02/26/20 0831 .  fenofibrate tablet 160 mg, 160 mg, Oral, Daily, Cox, Amy N, DO, 160 mg at 02/26/20 0833 .  furosemide (LASIX) injection 20 mg, 20 mg, Intravenous, Daily, Lanney Gins, Mariana Wiederholt, MD, 20 mg at 02/26/20 8466 .  hydrALAZINE (APRESOLINE) tablet 25 mg, 25 mg, Oral, BID, Cox, Amy N, DO, 25 mg at 02/26/20 0833 .  insulin aspart (novoLOG) injection 0-15 Units, 0-15 Units, Subcutaneous, TID WC, Cox, Amy N, DO, 2 Units at 02/26/20 0832 .  insulin aspart (novoLOG) injection 0-5 Units, 0-5 Units, Subcutaneous, QHS, Cox, Amy N, DO, 5 Units at 02/21/20 2211 .  insulin aspart (novoLOG) injection 3 Units, 3 Units, Subcutaneous, TID WC, HallLorenda Cahill, DO, 3 Units at 02/25/20 1227 .  ipratropium-albuterol (DUONEB) 0.5-2.5 (3) MG/3ML nebulizer solution 3 mL, 3 mL, Nebulization, TID PC, Cox, Amy N, DO, 3 mL at 02/26/20 0828 .  levothyroxine (SYNTHROID) tablet 112 mcg, 112 mcg, Oral, Q0600, Kayleen Memos, DO, 112 mcg at 02/26/20 0646 .  loperamide (IMODIUM) capsule 2 mg, 2 mg, Oral, PRN, Irene Pap N, DO, 2 mg at 02/26/20 0830 .  losartan (COZAAR) tablet 100 mg, 100 mg, Oral, Daily, Cox, Amy N, DO, 100 mg at 02/26/20 0831 .  MEDLINE mouth rinse, 15 mL, Mouth Rinse, BID, Cox, Amy N, DO, 15 mL at 02/26/20 0835 .  metFORMIN (GLUCOPHAGE) tablet 500 mg, 500 mg, Oral, BID WC, Cox, Amy N, DO, 500 mg at 02/26/20 0830 .  montelukast (SINGULAIR) tablet 10 mg, 10 mg, Oral, QHS, Cox, Amy N, DO, 10 mg at 02/25/20 2230 .  ondansetron (ZOFRAN) tablet 4 mg, 4 mg, Oral, Q6H PRN **OR** ondansetron (ZOFRAN) injection 4 mg, 4 mg, Intravenous, Q6H PRN, Cox, Amy N, DO .  pantoprazole (PROTONIX) EC tablet 40 mg, 40 mg, Oral, BID WC, Cox, Amy N, DO, 40 mg at 02/26/20 0831 .  pramipexole (MIRAPEX) tablet 0.5 mg, 0.5 mg, Oral, BID, Dallie Piles, RPH, 0.5 mg at 02/26/20 0830 .  predniSONE  (DELTASONE) tablet 50 mg, 50 mg, Oral, Q breakfast, Ravyn Nikkel, MD, 50 mg at 02/26/20 0830 .  [START ON 02/27/2020] tiotropium (SPIRIVA) inhalation capsule ([facility] use ONLY) 18 mcg, 18 mcg, Inhalation, Daily, Dallie Piles, RPH .  traZODone (DESYREL) tablet 150 mg, 150 mg, Oral, QHS, Cox, Amy N, DO, 150 mg at 02/25/20 2230    ALLERGIES   Codeine, Morphine and related, and Prednisone     REVIEW OF SYSTEMS    Review of Systems:  Gen:  Denies  fever, sweats, chills weigh loss  HEENT: Denies blurred vision, double vision, ear pain, eye pain, hearing loss, nose bleeds, sore  throat Cardiac:  No dizziness, chest pain or heaviness, chest tightness,edema Resp:   Denies cough or sputum porduction, shortness of breath,wheezing, hemoptysis,  Gi: Denies swallowing difficulty, stomach pain, nausea or vomiting, diarrhea, constipation, bowel incontinence Gu:  Denies bladder incontinence, burning urine Ext:   Denies Joint pain, stiffness or swelling Skin: Denies  skin rash, easy bruising or bleeding or hives Endoc:  Denies polyuria, polydipsia , polyphagia or weight change Psych:   Denies depression, insomnia or hallucinations   Other:  All other systems negative   VS: BP 122/60 (BP Location: Left Arm)   Pulse 69   Temp 98 F (36.7 C) (Oral)   Resp 14   Ht 5\' 4"  (1.626 m)   Wt 59 kg   SpO2 97%   BMI 22.31 kg/m      PHYSICAL EXAM    GENERAL:NAD, no fevers, chills, no weakness no fatigue HEAD: Normocephalic, atraumatic.  EYES: Pupils equal, round, reactive to light. Extraocular muscles intact. No scleral icterus.  MOUTH: Moist mucosal membrane. Dentition intact. No abscess noted.  EAR, NOSE, THROAT: Clear without exudates. No external lesions.  NECK: Supple. No thyromegaly. No nodules. No JVD.  PULMONARY: clear to auscultation bilatearlly with decreased breaths sounds CARDIOVASCULAR: S1 and S2. Regular rate and rhythm. No murmurs, rubs, or gallops. No edema. Pedal pulses 2+  bilaterally.  GASTROINTESTINAL: Soft, nontender, nondistended. No masses. Positive bowel sounds. No hepatosplenomegaly.  MUSCULOSKELETAL: No swelling, clubbing, or edema. Range of motion full in all extremities.  NEUROLOGIC: Cranial nerves II through XII are intact. No gross focal neurological deficits. Sensation intact. Reflexes intact.  SKIN: No ulceration, lesions, rashes, or cyanosis. Skin warm and dry. Turgor intact.  PSYCHIATRIC: Mood, affect within normal limits. The patient is awake, alert and oriented x 3. Insight, judgment intact.       IMAGING    DG Chest 2 View  Result Date: 02/19/2020 CLINICAL DATA:  Short of breath.  COPD. EXAM: CHEST - 2 VIEW COMPARISON:  07/19/2019 FINDINGS: Cardiac silhouette is normal in size. No mediastinal or hilar masses or evidence of adenopathy. Lungs are hyperexpanded. Linear stable scarring noted at the lung bases. Lungs otherwise clear. No pleural effusion or pneumothorax. Skeletal structures are demineralized, but intact. Stable changes from a previous anterior cervical spine fusion. IMPRESSION: 1. No acute cardiopulmonary disease. 2. COPD.  Chronic lung base scarring. Electronically Signed   By: Lajean Manes M.D.   On: 02/19/2020 18:09   DG Chest Portable 1 View  Result Date: 02/21/2020 CLINICAL DATA:  COPD exacerbation on BiPAP. EXAM: PORTABLE CHEST 1 VIEW COMPARISON:  02/19/2020 FINDINGS: The cardiomediastinal silhouette is within normal limits. Aortic atherosclerosis is noted. Emphysema and mild bibasilar lung scarring are again noted. No acute airspace consolidation, edema, pleural effusion, or pneumothorax is identified. Prior anterior cervical fusion is noted. IMPRESSION: No active disease. Electronically Signed   By: Logan Bores M.D.   On: 02/21/2020 13:38   MM 3D SCREEN BREAST BILATERAL  Result Date: 02/12/2020 CLINICAL DATA:  Screening. EXAM: DIGITAL SCREENING BILATERAL MAMMOGRAM WITH TOMO AND CAD COMPARISON:  Previous exam(s). ACR  Breast Density Category b: There are scattered areas of fibroglandular density. FINDINGS: There are no findings suspicious for malignancy. Images were processed with CAD. IMPRESSION: No mammographic evidence of malignancy. A result letter of this screening mammogram will be mailed directly to the patient. RECOMMENDATION: Screening mammogram in one year. (Code:SM-B-01Y) BI-RADS CATEGORY  1: Negative. Electronically Signed   By: Lajean Manes M.D.   On: 02/12/2020 13:55  ASSESSMENT/PLAN   Acute on chronic hypoxemic respiratory failure -Due to acute exacerbation of COPD -patient reports aspiration of food on her birthday last week- "choked to death" she states after having esophagus dilated x3 she has regurgitation.  We discussed follow up with GI who she is established with.   -Aspiration is likely cause of her exacerbation of COPD  -will order barium swallow on outpatient basis post d/c - She is close to baseline on her O2 requirement - currently on 4L which is what she uses at home -will need to start MetaNEB x 1 day for recruitment maneuvers.  -can d/c hypertonic saline -decreased solumedrol to once daily IV 40>>> prednisone 50 -02/25/20- -will order legionella ab due to diarreah with resp distress -changed abx from Unasyn>>Augmentin 4 days more -from pulmonary perspective may initiate dc planning   Thank you for allowing me to participate in the care of this patient.   Patient/Family are satisfied with care plan and all questions have been answered.   This document was prepared using Dragon voice recognition software and may include unintentional dictation errors.     Ottie Glazier, M.D.  Division of [place]

## 2020-02-26 NOTE — Discharge Instructions (Signed)
Chronic Obstructive Pulmonary Disease Exacerbation Chronic obstructive pulmonary disease (COPD) is a long-term (chronic) lung problem. In COPD, the flow of air from the lungs is limited. COPD exacerbations are times that breathing gets worse and you need more than your normal treatment. Without treatment, they can be life threatening. If they happen often, your lungs can become more damaged. If your COPD gets worse, your doctor may treat you with:  Medicines.  Oxygen.  Different ways to clear your airway, such as using a mask. Follow these instructions at home: Medicines  Take over-the-counter and prescription medicines only as told by your doctor.  If you take an antibiotic or steroid medicine, do not stop taking the medicine even if you start to feel better.  Keep up with shots (vaccinations) as told by your doctor. Be sure to get a yearly (annual) flu shot. Lifestyle  Do not smoke. If you need help quitting, ask your doctor.  Eat healthy foods.  Exercise regularly.  Get plenty of sleep.  Avoid tobacco smoke and other things that can bother your lungs.  Wash your hands often with soap and water. This will help keep you from getting an infection. If you cannot use soap and water, use hand sanitizer.  During flu season, avoid areas that are crowded with people. General instructions  Drink enough fluid to keep your pee (urine) clear or pale yellow. Do not do this if your doctor has told you not to.  Use a cool mist machine (vaporizer).  If you use oxygen or a machine that turns medicine into a mist (nebulizer), continue to use it as told.  Follow all instructions for rehabilitation. These are steps you can take to make your body work better.  Keep all follow-up visits as told by your doctor. This is important. Contact a doctor if:  Your COPD symptoms get worse than normal. Get help right away if:  You are short of breath and it gets worse.  You have trouble  talking.  You have chest pain.  You cough up blood.  You have a fever.  You keep throwing up (vomiting).  You feel weak or you pass out (faint).  You feel confused.  You are not able to sleep because of your symptoms.  You are not able to do daily activities. Summary  COPD exacerbations are times that breathing gets worse and you need more treatment than normal.  COPD exacerbations can be very serious and may cause your lungs to become more damaged.  Do not smoke. If you need help quitting, ask your doctor.  Stay up-to-date on your shots. Get a flu shot every year. This information is not intended to replace advice given to you by your health care provider. Make sure you discuss any questions you have with your health care provider. Document Revised: 02/13/2017 Document Reviewed: 04/07/2016 Elsevier Patient Education  [place].   COPD and Physical Activity Chronic obstructive pulmonary disease (COPD) is a long-term (chronic) condition that affects the lungs. COPD is a general term that can be used to describe many different lung problems that cause lung swelling (inflammation) and limit airflow, including chronic bronchitis and emphysema. The main symptom of COPD is shortness of breath, which makes it harder to do even simple tasks. This can also make it harder to exercise and be active. Talk with your health care provider about treatments to help you breathe better and actions you can take to prevent breathing problems during physical activity. What are the  benefits of exercising with COPD? Exercising regularly is an important part of a healthy lifestyle. You can still exercise and do physical activities even though you have COPD. Exercise and physical activity improve your shortness of breath by increasing blood flow (circulation). This causes your heart to pump more oxygen through your body. Moderate exercise can improve your:  Oxygen use.  Energy  level.  Shortness of breath.  Strength in your breathing muscles.  Heart health.  Sleep.  Self-esteem and feelings of self-worth.  Depression, stress, and anxiety levels. Exercise can benefit everyone with COPD. The severity of your disease may affect how hard you can exercise, especially at first, but everyone can benefit. Talk with your health care provider about how much exercise is safe for you, and which activities and exercises are safe for you. What actions can I take to prevent breathing problems during physical activity?  Sign up for a pulmonary rehabilitation program. This type of program may include: ? Education about lung diseases. ? Exercise classes that teach you how to exercise and be more active while improving your breathing. This usually involves:  Exercise using your lower extremities, such as a stationary bicycle.  About 30 minutes of exercise, 2 to 5 times per week, for 6 to 12 weeks  Strength training, such as push ups or leg lifts. ? Nutrition education. ? Group classes in which you can talk with others who also have COPD and learn ways to manage stress.  If you use an oxygen tank, you should use it while you exercise. Work with your health care provider to adjust your oxygen for your physical activity. Your resting flow rate is different from your flow rate during physical activity.  While you are exercising: ? Take slow breaths. ? Pace yourself and do not try to go too fast. ? Purse your lips while breathing out. Pursing your lips is similar to a kissing or whistling position. ? If doing exercise that uses a quick burst of effort, such as weight lifting:  Breathe in before starting the exercise.  Breathe out during the hardest part of the exercise (such as raising the weights). Where to find support You can find support for exercising with COPD from:  Your health care provider.  A pulmonary rehabilitation program.  Your local health department or  community health programs.  Support groups, online or in-person. Your health care provider may be able to recommend support groups. Where to find more information You can find more information about exercising with COPD from:  American Lung Association: ClassInsider.se.  COPD Foundation: https://www.rivera.net/. Contact a health care provider if:  Your symptoms get worse.  You have chest pain.  You have nausea.  You have a fever.  You have trouble talking or catching your breath.  You want to start a new exercise program or a new activity. Summary  COPD is a general term that can be used to describe many different lung problems that cause lung swelling (inflammation) and limit airflow. This includes chronic bronchitis and emphysema.  Exercise and physical activity improve your shortness of breath by increasing blood flow (circulation). This causes your heart to provide more oxygen to your body.  Contact your health care provider before starting any exercise program or new activity. Ask your health care provider what exercises and activities are safe for you. This information is not intended to replace advice given to you by your health care provider. Make sure you discuss any questions you have with your health  care provider. Document Revised: 06/23/2018 Document Reviewed: 03/26/2017 Elsevier Patient Education  [place] of Breath, Adult Shortness of breath means you have trouble breathing. Shortness of breath could be a sign of a medical problem. Follow these instructions at home:   Watch for any changes in your symptoms.  Do not use any products that contain nicotine or tobacco, such as cigarettes, e-cigarettes, and chewing tobacco.  Do not smoke. Smoking can cause shortness of breath. If you need help to quit smoking, ask your doctor.  Avoid things that can make it harder to breathe, such as: ? Mold. ? Dust. ? Air pollution. ? Chemical smells. ? Things  that can cause allergy symptoms (allergens), if you have allergies.  Keep your living space clean. Use products that help remove mold and dust.  Rest as needed. Slowly return to your normal activities.  Take over-the-counter and prescription medicines only as told by your doctor. This includes oxygen therapy and inhaled medicines.  Keep all follow-up visits as told by your doctor. This is important. Contact a doctor if:  Your condition does not get better as soon as expected.  You have a hard time doing your normal activities, even after you rest.  You have new symptoms. Get help right away if:  Your shortness of breath gets worse.  You have trouble breathing when you are resting.  You feel light-headed or you pass out (faint).  You have a cough that is not helped by medicines.  You cough up blood.  You have pain with breathing.  You have pain in your chest, arms, shoulders, or belly (abdomen).  You have a fever.  You cannot walk up stairs.  You cannot exercise the way you normally do. These symptoms may represent a serious problem that is an emergency. Do not wait to see if the symptoms will go away. Get medical help right away. Call your local emergency services (911 in the [place] Do not drive yourself to the hospital. Summary  Shortness of breath is when you have trouble breathing enough air. It can be a sign of a medical problem.  Avoid things that make it hard for you to breathe, such as smoking, pollution, mold, and dust.  Watch for any changes in your symptoms. Contact your doctor if you do not get better or you get worse. This information is not intended to replace advice given to you by your health care provider. Make sure you discuss any questions you have with your health care provider. Document Revised: 08/03/2017 Document Reviewed: 08/03/2017 Elsevier Patient Education  [place].   Hypoxia Hypoxia is a condition that happens when there is a  lack of oxygen in the body's tissues and organs. When there is not enough oxygen, organs cannot work as they should. This causes serious problems throughout the body and in the brain. What are the causes? This condition may be caused by:  Exposure to high altitude.  A collapsed lung (pneumothorax).  Lung infection (pneumonia).  Lung injury.  Long-term (chronic) lung disease, such as COPD (chronic obstructive pulmonary disease).  Blood collecting in the chest cavity (hemothorax).  Food, saliva, or vomit getting into the airway (aspiration).  Reduced blood flow (ischemia).  Severe blood loss.  Slow or shallow breathing (hypoventilation).  Blood disorders, such as anemia.  Carbon monoxide poisoning.  The heart suddenly stopping (cardiac arrest).  Anesthetic medicines.  Drowning.  Choking. What are the signs or symptoms? Symptoms of this condition include:  Headache.  Fatigue.  Drowsiness.  Forgetfulness.  Nausea.  Confusion.  Shortness of breath.  Dizziness.  Bluish color of the skin, lips, or nail beds (cyanosis).  Change in consciousness or awareness. If hypoxia is not treated, it can lead to convulsions, loss of consciousness (coma), or brain damage. How is this diagnosed? This condition may be diagnosed based on:  A physical exam.  Blood tests.  A test that measures how much oxygen is in your blood (pulse oximetry). This is done with a sensor that is placed on your finger, toe, or earlobe.  Chest X-ray.  Tests to check your lung function (pulmonary function tests).  A test to check the electrical activity of your heart (electrocardiogram, ECG). You may have other tests to determine the cause of your hypoxia. How is this treated?  Treatment for this condition depends on what is causing the hypoxia. You will likely be treated with oxygen therapy. This may be done by giving you oxygen through a face mask or through tubes in your nose. Your  health care provider may also recommend other therapies to treat the underlying cause of your hypoxia. Follow these instructions at home:  Take over-the-counter and prescription medicines only as told by your health care provider.  Do not use any products that contain nicotine or tobacco, such as cigarettes and e-cigarettes. If you need help quitting, ask your health care provider.  Avoid secondhand smoke.  Work with your health care provider to manage any chronic conditions you have that may be causing hypoxia, such as COPD.  Keep all follow-up visits as told by your health care provider. This is important. Contact a health care provider if:  You have a fever.  You have trouble breathing, even after treatment.  You become extremely short of breath when you exercise. Get help right away if:  Your shortness of breath gets worse, especially with normal or very little activity.  Your skin, lips, or nail beds have a bluish color.  You become confused or you cannot think properly.  You have chest pain. Summary  Hypoxia is a condition that happens when there is a lack of oxygen in the body's tissues and organs.  If hypoxia is not treated, it can lead to convulsions, loss of consciousness (coma), or brain damage.  Symptoms of hypoxia can include a headache, shortness of breath, confusion, nausea, and a bluish skin color.  Hypoxia has many possible causes, including exposure to high altitude, carbon monoxide poisoning, or other health issues, such as blood disorders or cardiac arrest.  Hypoxia is usually treated with oxygen therapy. This information is not intended to replace advice given to you by your health care provider. Make sure you discuss any questions you have with your health care provider. Document Revised: 02/13/2017 Document Reviewed: 04/21/2016 Elsevier Patient Education  [place].   Hypoxemia  Hypoxemia occurs when the blood does not contain enough  oxygen. The body cannot work well when it does not have enough oxygen because every part of the body needs oxygen. Oxygen enters the lungs when we breathe in, then it travels to all parts of the body through the blood. Hypoxemia can develop suddenly or slowly. What are the causes? Common causes of this condition include:  Long-term (chronic) lung diseases, such as chronic obstructive pulmonary disease (COPD) or interstitial lung disease.  Disorders that affect breathing at night, such as sleep apnea.  Fluid buildup in the lungs (pulmonary edema).  Lung infection (pneumonia).  Lung or  throat cancer.  Abnormal blood flow that bypasses the lungs (having a shunt).  Certain diseases that affect nerves or muscles.  A collapsed lung (pneumothorax).  A blood clot in the lungs (pulmonary embolus).  Certain types of heart disease.  Slow or shallow breathing (hypoventilation).  Certain medicines.  High altitudes.  Toxic chemicals, smoke, and gases. What are the signs or symptoms? In some cases, there may be no symptoms of this condition. If you do have symptoms, they may include:  Shortness of breath (dyspnea).  Bluish color of the skin, lips, or nail beds.  Breathing that is fast, noisy, or shallow.  A fast heartbeat.  Feeling tired or sleepy.  Feeling confused or worried. If hypoxemia develops quickly, you will likely have dyspnea. If hypoxemia develops slowly over months or years, you may not notice any symptoms. How is this diagnosed? This condition is diagnosed by:  A physical exam.  Blood tests.  A test that measures the percentage of oxygen in your blood (pulse oximetry). This is done with a sensor that is placed on your finger, toe, or earlobe. How is this treated? Treatment for this condition depends on the underlying cause of your hypoxemia. You will likely be treated with oxygen therapy to restore your blood oxygen level. Depending on the cause of your  hypoxemia, you may need oxygen therapy for a short time (weeks or months), or you may need it for the rest of your life. Your health care provider may also recommend other therapies to treat the underlying cause of your hypoxemia. Follow these instructions at home:   Take over-the-counter and prescription medicines only as told by your health care provider.  If you are on oxygen therapy, follow oxygen safety precautions as directed by your health care provider. These may include: ? Always having a backup supply of oxygen. ? Not allowing anyone to smoke or have a fire around oxygen. ? Handling oxygen tanks carefully and as instructed.  Do not use any products that contain nicotine or tobacco, such as cigarettes and e-cigarettes. If you need help quitting, ask your health care provider. Stay away from people who smoke.  Keep all follow-up visits as told by your health care provider. This is important. Contact a health care provider if:  You have any concerns about your oxygen therapy.  You have trouble breathing, even during or after treatment.  You become short of breath when you exercise.  You are tired when you wake up.  You have a headache when you wake up. Get help right away if:  Your shortness of breath gets worse, especially with normal or minimal activity.  You have a bluish color of the skin, lips, or nail beds.  You become confused or you cannot think properly.  You cough up dark mucus or blood.  You have chest pain.  You have a fever. Summary  Hypoxemia occurs when the blood does not contain enough oxygen.  Hypoxemia may or may not cause symptoms. Often, the main symptom is shortness of breath (dyspnea).  Depending on the cause of your hypoxemia, you may need oxygen therapy for a short time (weeks or months), or you may need it for the rest of your life.  If you are on oxygen therapy, follow oxygen safety precautions as directed by your health care  provider. This information is not intended to replace advice given to you by your health care provider. Make sure you discuss any questions you have with your health care provider.  Document Revised: 12/22/2017 Document Reviewed: 02/05/2016 Elsevier Patient Education  2020 [employer].

## 2020-02-26 NOTE — Discharge Summary (Signed)
Discharge Summary  Kristin [name] OVF:[id number] DOB: December 05, 1945  PCP: Tracie Harrier, MD  Admit date: 02/21/2020 Discharge date: 02/26/2020  Time spent: 35 minutes  Recommendations for Outpatient Follow-up:  1. Follow-up with pulmonary 2. Follow-up with your PCP 3. Take medications as prescribed 4. Continue PT OT with assistance and fall precautions  Discharge Diagnoses:  Active Hospital Problems   Diagnosis Date Noted   COPD exacerbation ([place]) 07/19/2019   Acute exacerbation of chronic obstructive pulmonary disease (COPD) ([place]) 02/22/2020   Diabetes ([place]) 10/26/2017   Essential hypertension 10/26/2017   Chronic respiratory failure with hypoxia ([place]) 11/30/2016   UTI (urinary tract infection) 11/30/2016   Weakness generalized 11/30/2016   Acute respiratory failure ([place]) 05/23/2016    Resolved Hospital Problems  No resolved problems to display.    Discharge Condition: Stable  Diet recommendation: Heart healthy carb modified diet  Vitals:   02/26/20 0412 02/26/20 0825  BP: (!) 117/52 122/60  Pulse: 69 69  Resp: 18 14  Temp: 97.8 F (36.6 C) 98 F (36.7 C)  SpO2: 100% 97%    History of present illness:  Kristin Claxton Lockemyis a 74 y.o.femalewith medical history significant for COPD,chronic hypoxia on 4 L nasal cannula at baseline, COVID-19 viral infection, essential hypertension, type 2 diabetes, prior CVA, presented to [facility] emergency department for chief concerns of worsening shortness of breath, associated with a persistent nonproductive cough. She presented to the emergency department on 02/19/2020. She was given duo nebs and Keflex 500 mg twice daily. She states she was not given steroids due to history of diabetes mellitus. She endorsed dysuria and urgency and intermittent chronic loose stools for which she takes Imodium as needed.  She was admitted for acute COPD exacerbation, failedoutpatienttherapy.  Seen by pulmonary, she had reported  aspiration of food on her birthday last week and occasional choking on food if she is not careful.    Speech therapy was consulted to further assess.  After assessment concluded mild aspiration risk, recommended mechanical soft diet with thin liquid, whole medications with pure, to minimize environmental distractions slow rate, small sips and bites.  Also recommended to be seated upright at 90 degrees and remain upright for at least 30 minutes after p.o. intake.  She received Unasyn and was switched to Augmentin by pulmonary for possible aspiration pneumonia.  02/26/20:  Seen and examined with her husband at her bedside.  She had no acute events overnight.  She feels better this morning.  She has no new complaints and she is eager to go home.   Hospital Course:  Principal Problem:   COPD exacerbation ([place]) Active Problems:   Acute respiratory failure (HCC)   UTI (urinary tract infection)   Chronic respiratory failure with hypoxia (HCC)   Weakness generalized   Diabetes (HCC)   Essential hypertension   Acute exacerbation of chronic obstructive pulmonary disease (COPD) ([place])   Acute COPD exacerbation with suspected aspiration and possible dysphagia Presented with worsening dyspnea No evidence of active disease on chest x-ray Admitted for acute COPD exacerbation Received IV steroids and bronchodilators Received Unasyn which later was switched to Augmentin for possible aspiration pneumonia. Her oxygen requirement is back to her baseline.  Suspected aspiration pneumonia Patient has reported choking on food intermittently Seen by speech therapist with recommendation for dysphagia 3 diet as stated above. Continue aspiration precautions Unasyn on 02/24/2020, switched to Augmentin on 02/25/2020. Continue Augmentin on 02/26/20 x 4 days.  Acute on chronic hypoxic respiratory failure She is on 4 L  nasal cannula at baseline continuously She is currently back to her baseline, on 4 L  with O2 saturation of 100%. Initially presented with acute on chronic hypoxia requiring BiPAP. Seen by pulmonary, she will need to follow-up with a pulmonologist outpatient.  Intermittent loose stools, chronically Nonseptic appearing, afebrile Continue home Imodium as needed Follow-up with your PCP or GI.  Presumptive UTI UA positive for pyuria on 02/19/2020 No urine culture or blood cultures data. Received 3 days of IV Rocephin  Chronic anxiety/depression Continue home regimen.  Type 2 diabetes with hyperglycemia Hemoglobin A1c 6.8 on 02/21/2020 Resume home regimen.  Chronic normocytic anemia Baseline hemoglobin appears to be 11 Hemoglobin 10.2 No overt bleeding Follow up with your PCP  Chronic diastolic CHF Last 2D echo done on 05/24/2016 showed normal LVEF Euvolemic on exam No acute issues  GERD Continue home regimen Follow-up with your PCP or GI.  Hyperlipidemia Continue home regimen  History of COVID-19 pneumonia 05/27/19 Continue current management  Ambulatory dysfunction PT assessment recommended home health PT OT DME rolling walker Continue PT with assistance and fall precautions.   Code Status: Full code  Family Communication:  Updated her husband at bedside on 02/26/2020.    Consultants: Pulmonary Procedures:  None  Antimicrobials:  Rocephin 02/19/20>> 02/24/2020  Z-Pak added on 02/23/2020  Unasyn 02/24/2020>>> 02/25/2020  Augmentin 02/25/2020>>> 03/01/2020    Discharge Exam: BP 122/60 (BP Location: Left Arm)    Pulse 69    Temp 98 F (36.7 C) (Oral)    Resp 14    Ht 5\' 4"  (1.626 m)    Wt 59 kg    SpO2 97%    BMI 22.31 kg/m   General: 74 y.o. year-old female well developed well nourished in no acute distress.  Alert and oriented x3.  Cardiovascular: Regular rate and rhythm with no rubs or gallops.  No thyromegaly or JVD noted.    Respiratory: Clear to auscultation with no wheezes or rales. Good inspiratory  effort.  Abdomen: Soft nontender nondistended with normal bowel sounds x4 quadrants.  Musculoskeletal: No lower extremity edema. 2/4 pulses in all 4 extremities.  Psychiatry: Mood is appropriate for condition and setting  Discharge Instructions You were cared for by a hospitalist during your hospital stay. If you have any questions about your discharge medications or the care you received while you were in the hospital after you are discharged, you can call the unit and asked to speak with the hospitalist on call if the hospitalist that took care of you is not available. Once you are discharged, your primary care physician will handle any further medical issues. Please note that NO REFILLS for any discharge medications will be authorized once you are discharged, as it is imperative that you return to your primary care physician (or establish a relationship with a primary care physician if you do not have one) for your aftercare needs so that they can reassess your need for medications and monitor your lab values.   Allergies as of 02/26/2020      Reactions   Codeine Nausea Only   Can take along with medication for nausea   Morphine And Related Nausea Only   Can take along with medication for nausea   Prednisone Other (See Comments)   Elevated BGL into the 300's      Medication List    STOP taking these medications   cephALEXin 500 MG capsule Commonly known as: KEFLEX     TAKE these medications   alendronate 70 MG  tablet Commonly known as: FOSAMAX Take 70 mg by mouth once a week.   amLODipine 10 MG tablet Commonly known as: NORVASC Take 1 tablet (10 mg total) by mouth daily.   amoxicillin-clavulanate 875-125 MG tablet Commonly known as: AUGMENTIN Take 1 tablet by mouth every 12 (twelve) hours for 4 days.   atorvastatin 40 MG tablet Commonly known as: LIPITOR Take 40 mg by mouth daily.   citalopram 20 MG tablet Commonly known as: CELEXA Take 20 mg by mouth daily.    clonazePAM 1 MG tablet Commonly known as: KLONOPIN Take 1 mg by mouth in the morning.   famotidine 20 MG tablet Commonly known as: PEPCID Take 1 tablet (20 mg total) by mouth daily.   fenofibrate 160 MG tablet Take 160 mg by mouth daily.   hydrALAZINE 50 MG tablet Commonly known as: APRESOLINE Take 0.5 tablets (25 mg total) by mouth 2 (two) times daily.   ipratropium-albuterol 0.5-2.5 (3) MG/3ML Soln Commonly known as: DUONEB Take 3 mLs by nebulization in the morning and at bedtime.   levothyroxine 112 MCG tablet Commonly known as: SYNTHROID Take 112 mcg by mouth daily before breakfast.   loperamide 2 MG capsule Commonly known as: IMODIUM Take 1 capsule (2 mg total) by mouth as needed for diarrhea or loose stools.   losartan 100 MG tablet Commonly known as: COZAAR Take 100 mg by mouth daily.   metFORMIN 500 MG tablet Commonly known as: GLUCOPHAGE Take 500 mg by mouth 2 (two) times daily with a meal.   montelukast 10 MG tablet Commonly known as: SINGULAIR Take 10 mg by mouth at bedtime.   OXYGEN Inhale 4 L/min into the lungs continuous.   pantoprazole 40 MG tablet Commonly known as: PROTONIX Take 1 tablet by mouth 2 (two) times daily with breakfast and lunch.   pramipexole 0.5 MG tablet Commonly known as: MIRAPEX Take 0.5 mg by mouth in the morning and at bedtime.   saccharomyces boulardii 250 MG capsule Commonly known as: Florastor Take 1 capsule (250 mg total) by mouth 2 (two) times daily for 10 days.   Spiriva HandiHaler 18 MCG inhalation capsule Generic drug: tiotropium Place 18 mcg into inhaler and inhale daily.   traZODone 150 MG tablet Commonly known as: DESYREL Take 150 mg by mouth at bedtime.   Ventolin HFA 108 (90 Base) MCG/ACT inhaler Generic drug: albuterol Inhale 2 puffs into the lungs every 3 (three) hours as needed for wheezing or shortness of breath.   albuterol (2.5 MG/3ML) 0.083% nebulizer solution Commonly known as:  PROVENTIL Take 2.5 mg by nebulization every 6 (six) hours.            Durable Medical Equipment  (From admission, onward)         Start     Ordered   02/24/20 1704  For home use only DME Walker rolling  Once       Question Answer Comment  Walker: With 5 Inch Wheels   Patient needs a walker to treat with the following condition Ambulatory dysfunction      02/24/20 1703         Allergies  Allergen Reactions   Codeine Nausea Only    Can take along with medication for nausea   Morphine And Related Nausea Only    Can take along with medication for nausea   Prednisone Other (See Comments)    Elevated BGL into the 300's    Follow-up Information    [facility]  Specialty: Home Health Services Why: They will follow up with you for your home health needs.       Tracie Harrier, MD. Call in 1 day(s).   Specialty: Internal Medicine Why: please call for a post hospital follow up appointment Contact information: [place] [address] [phone]        Ottie Glazier, MD Follow up.   Specialty: Pulmonary Disease Why: please call for a post hospital follow up appointment Contact information: [place] [address] [phone]                The results of significant diagnostics from this hospitalization (including imaging, microbiology, ancillary and laboratory) are listed below for reference.    Significant Diagnostic Studies: DG Chest 2 View  Result Date: 02/19/2020 CLINICAL DATA:  Short of breath.  COPD. EXAM: CHEST - 2 VIEW COMPARISON:  07/19/2019 FINDINGS: Cardiac silhouette is normal in size. No mediastinal or hilar masses or evidence of adenopathy. Lungs are hyperexpanded. Linear stable scarring noted at the lung bases. Lungs otherwise clear. No pleural effusion or pneumothorax. Skeletal structures are demineralized, but intact. Stable changes from a  previous anterior cervical spine fusion. IMPRESSION: 1. No acute cardiopulmonary disease. 2. COPD.  Chronic lung base scarring. Electronically Signed   By: Lajean Manes M.D.   On: 02/19/2020 18:09   DG Chest Portable 1 View  Result Date: 02/21/2020 CLINICAL DATA:  COPD exacerbation on BiPAP. EXAM: PORTABLE CHEST 1 VIEW COMPARISON:  02/19/2020 FINDINGS: The cardiomediastinal silhouette is within normal limits. Aortic atherosclerosis is noted. Emphysema and mild bibasilar lung scarring are again noted. No acute airspace consolidation, edema, pleural effusion, or pneumothorax is identified. Prior anterior cervical fusion is noted. IMPRESSION: No active disease. Electronically Signed   By: Logan Bores M.D.   On: 02/21/2020 13:38   MM 3D SCREEN BREAST BILATERAL  Result Date: 02/12/2020 CLINICAL DATA:  Screening. EXAM: DIGITAL SCREENING BILATERAL MAMMOGRAM WITH TOMO AND CAD COMPARISON:  Previous exam(s). ACR Breast Density Category b: There are scattered areas of fibroglandular density. FINDINGS: There are no findings suspicious for malignancy. Images were processed with CAD. IMPRESSION: No mammographic evidence of malignancy. A result letter of this screening mammogram will be mailed directly to the patient. RECOMMENDATION: Screening mammogram in one year. (Code:[id number]) BI-RADS CATEGORY  1: Negative. Electronically Signed   By: Lajean Manes M.D.   On: 02/12/2020 13:55    Microbiology: Recent Results (from the past 240 hour(s))  Resp Panel by RT-PCR (Flu A&B, Covid) Nasopharyngeal Swab     Status: None   Collection Time: 02/19/20  7:18 PM   Specimen: Nasopharyngeal Swab; Nasopharyngeal(NP) swabs in vial transport medium  Result Value Ref Range Status   SARS Coronavirus 2 by RT PCR NEGATIVE NEGATIVE Final    Comment: (NOTE) SARS-CoV-2 target nucleic acids are NOT DETECTED.  The SARS-CoV-2 RNA is generally detectable in upper respiratory specimens during the acute phase of infection. The  lowest concentration of SARS-CoV-2 viral copies this assay can detect is 138 copies/mL. A negative result does not preclude SARS-Cov-2 infection and should not be used as the sole basis for treatment or other patient management decisions. A negative result may occur with  improper specimen collection/handling, submission of specimen other than nasopharyngeal swab, presence of viral mutation(s) within the areas targeted by this assay, and inadequate number of viral copies(<138 copies/mL). A negative result must be combined with clinical observations, patient history, and epidemiological information. The expected result  is Negative.  Fact Sheet for Patients:  EntrepreneurPulse.com.au  Fact Sheet for Healthcare Providers:  IncredibleEmployment.be  This test is no t yet approved or cleared by the [place] FDA and  has been authorized for detection and/or diagnosis of SARS-CoV-2 by FDA under an Emergency Use Authorization (EUA). This EUA will remain  in effect (meaning this test can be used) for the duration of the COVID-19 declaration under Section 564(b)(1) of the Act, 21 U.S.C.section 360bbb-3(b)(1), unless the authorization is terminated  or revoked sooner.       Influenza A by PCR NEGATIVE NEGATIVE Final   Influenza B by PCR NEGATIVE NEGATIVE Final    Comment: (NOTE) The Xpert Xpress SARS-CoV-2/FLU/RSV plus assay is intended as an aid in the diagnosis of influenza from Nasopharyngeal swab specimens and should not be used as a sole basis for treatment. Nasal washings and aspirates are unacceptable for Xpert Xpress SARS-CoV-2/FLU/RSV testing.  Fact Sheet for Patients: EntrepreneurPulse.com.au  Fact Sheet for Healthcare Providers: IncredibleEmployment.be  This test is not yet approved or cleared by the [place] FDA and has been authorized for detection and/or diagnosis of SARS-CoV-2 by FDA under  an Emergency Use Authorization (EUA). This EUA will remain in effect (meaning this test can be used) for the duration of the COVID-19 declaration under Section 564(b)(1) of the Act, 21 U.S.C. section 360bbb-3(b)(1), unless the authorization is terminated or revoked.  Performed at [facility], [address] [place], [place] [address]   Resp Panel by RT-PCR (Flu A&B, Covid) Nasopharyngeal Swab     Status: None   Collection Time: 02/21/20 12:56 PM   Specimen: Nasopharyngeal Swab; Nasopharyngeal(NP) swabs in vial transport medium  Result Value Ref Range Status   SARS Coronavirus 2 by RT PCR NEGATIVE NEGATIVE Final    Comment: (NOTE) SARS-CoV-2 target nucleic acids are NOT DETECTED.  The SARS-CoV-2 RNA is generally detectable in upper respiratory specimens during the acute phase of infection. The lowest concentration of SARS-CoV-2 viral copies this assay can detect is 138 copies/mL. A negative result does not preclude SARS-Cov-2 infection and should not be used as the sole basis for treatment or other patient management decisions. A negative result may occur with  improper specimen collection/handling, submission of specimen other than nasopharyngeal swab, presence of viral mutation(s) within the areas targeted by this assay, and inadequate number of viral copies(<138 copies/mL). A negative result must be combined with clinical observations, patient history, and epidemiological information. The expected result is Negative.  Fact Sheet for Patients:  EntrepreneurPulse.com.au  Fact Sheet for Healthcare Providers:  IncredibleEmployment.be  This test is no t yet approved or cleared by the [place] FDA and  has been authorized for detection and/or diagnosis of SARS-CoV-2 by FDA under an Emergency Use Authorization (EUA). This EUA will remain  in effect (meaning this test can be used) for the duration of the COVID-19 declaration under  Section 564(b)(1) of the Act, 21 U.S.C.section 360bbb-3(b)(1), unless the authorization is terminated  or revoked sooner.       Influenza A by PCR NEGATIVE NEGATIVE Final   Influenza B by PCR NEGATIVE NEGATIVE Final    Comment: (NOTE) The Xpert Xpress SARS-CoV-2/FLU/RSV plus assay is intended as an aid in the diagnosis of influenza from Nasopharyngeal swab specimens and should not be used as a sole basis for treatment. Nasal washings and aspirates are unacceptable for Xpert Xpress SARS-CoV-2/FLU/RSV testing.  Fact Sheet for Patients: EntrepreneurPulse.com.au  Fact Sheet for Healthcare Providers: IncredibleEmployment.be  This test is  not yet approved or cleared by the [place] and has been authorized for detection and/or diagnosis of SARS-CoV-2 by FDA under an Emergency Use Authorization (EUA). This EUA will remain in effect (meaning this test can be used) for the duration of the COVID-19 declaration under Section 564(b)(1) of the Act, 21 U.S.C. section 360bbb-3(b)(1), unless the authorization is terminated or revoked.  Performed at [facility], [address] [place], [place] [address]      Labs: Basic Metabolic Panel: Recent Labs  Lab 02/19/20 1718 02/21/20 1257 02/22/20 0359 02/24/20 0634  NA 137 137 140 136  K 4.5 3.9 4.2 4.2  CL 102 103 106 101  CO2 27 23 27 26   GLUCOSE 139* 186* 132* 166*  BUN 24* 22 21 21   CREATININE 1.06* 1.03* 0.85 0.89  CALCIUM 10.0 9.2 8.7* 8.9  MG  --  3.5*  --  2.1   Liver Function Tests: Recent Labs  Lab 02/21/20 1257  AST 16  ALT 16  ALKPHOS 39  BILITOT 0.8  PROT 6.7  ALBUMIN 4.3   No results for input(s): LIPASE, AMYLASE in the last 168 hours. No results for input(s): AMMONIA in the last 168 hours. CBC: Recent Labs  Lab 02/19/20 1718 02/21/20 1257 02/22/20 0359  WBC 7.3 11.4* 7.5  NEUTROABS  --  6.6  --   HGB 11.7* 11.0* 10.2*  HCT 35.4* 34.5* 31.4*  MCV  95.4 95.8 96.3  PLT 205 202 204   Cardiac Enzymes: No results for input(s): CKTOTAL, CKMB, CKMBINDEX, TROPONINI in the last 168 hours. BNP: BNP (last 3 results) Recent Labs    05/27/19 1858 02/21/20 1257  BNP 74.0 44.1    ProBNP (last 3 results) No results for input(s): PROBNP in the last 8760 hours.  CBG: Recent Labs  Lab 02/25/20 0753 02/25/20 1215 02/25/20 1655 02/25/20 2108 02/26/20 0748  GLUCAP 121* 226* 120* 141* 133*       Signed:  Kayleen Memos, MD [facility] 02/26/2020, 11:28 AM

## 2020-02-26 NOTE — TOC Transition Note (Signed)
Transition of Care [facility]) - CM/SW Discharge Note   Patient Details  Name: Kristin [name] MRN: [id number] Date of Birth: Dec 11, 1945  [facility] CM/SW Contact:  Boris Sharper, LCSW Phone Number: 02/26/2020, 11:21 AM   Clinical Narrative:    Pt medically stable for discharge per MD. Pt will be transported home by her son. CSW notified [facility] of discharge and pt will be followed for [facility] PT, OT, RN and Aide. Pt notified CSW that she can afford her medication, and prescriptions are to be sent to [facility] on [address] in [place].   Final next level of care: [place] Barriers to Discharge: No Barriers Identified   Patient Goals and CMS Choice     Choice offered to / list presented to : Patient  Discharge Placement                  Name of family member notified: son Patient and family notified of of transfer: 02/26/20  Discharge Plan and Services     Post Acute Care Choice: [place]: RN,PT,OT,Nurse's Aide [place]: [place] ([place]) Date HH Agency Contacted: 02/26/20 Time HH Agency Contacted: 1120 Representative spoke with at [place]: [place] (SDOH) Interventions     Readmission Risk Interventions Readmission Risk Prevention Plan 06/03/2019  Transportation Screening Complete  PCP or Specialist Appt within 3-5 Days Patient refused  [place] or [place] Complete  Medication Review (RN Care Manager) Complete  Some recent data might be hidden

## 2020-02-26 NOTE — Progress Notes (Signed)
Verified D/C home orders. Assessment stable, vitals stable. RN checks BG before leaving, pt says she will eat lunch later.  Reviewed D/C instructions, appointments to be made, medications with patient, verbalized understanding- meds were e scribed per AVS.  No S/S distress. Removed from floor with wheelchair by volunteer- per phone call son is waiting at front to pick pt up.

## 2020-06-18 ENCOUNTER — Emergency Department
Admission: EM | Admit: 2020-06-18 | Discharge: 2020-06-18 | Disposition: A | Discharge status: Home / Self Care | Payer: Medicare Other | Attending: Emergency Medicine | Admitting: Emergency Medicine

## 2020-06-18 ENCOUNTER — Emergency Department: Payer: Medicare Other

## 2020-06-18 ENCOUNTER — Other Ambulatory Visit: Payer: Self-pay

## 2020-06-18 DIAGNOSIS — Z79899 Other long term (current) drug therapy: Secondary | ICD-10-CM | POA: Insufficient documentation

## 2020-06-18 DIAGNOSIS — R42 Dizziness and giddiness: Secondary | ICD-10-CM | POA: Insufficient documentation

## 2020-06-18 DIAGNOSIS — Z85828 Personal history of other malignant neoplasm of skin: Secondary | ICD-10-CM | POA: Diagnosis not present

## 2020-06-18 DIAGNOSIS — Z7984 Long term (current) use of oral hypoglycemic drugs: Secondary | ICD-10-CM | POA: Diagnosis not present

## 2020-06-18 DIAGNOSIS — J45909 Unspecified asthma, uncomplicated: Secondary | ICD-10-CM | POA: Diagnosis not present

## 2020-06-18 DIAGNOSIS — I10 Essential (primary) hypertension: Secondary | ICD-10-CM | POA: Diagnosis not present

## 2020-06-18 DIAGNOSIS — Z7951 Long term (current) use of inhaled steroids: Secondary | ICD-10-CM | POA: Insufficient documentation

## 2020-06-18 DIAGNOSIS — Z87891 Personal history of nicotine dependence: Secondary | ICD-10-CM | POA: Insufficient documentation

## 2020-06-18 DIAGNOSIS — J449 Chronic obstructive pulmonary disease, unspecified: Secondary | ICD-10-CM | POA: Diagnosis not present

## 2020-06-18 DIAGNOSIS — E119 Type 2 diabetes mellitus without complications: Secondary | ICD-10-CM | POA: Diagnosis not present

## 2020-06-18 LAB — CBC
HCT: 35.7 % — ABNORMAL LOW (ref 36.0–46.0)
Hemoglobin: 11.2 g/dL — ABNORMAL LOW (ref 12.0–15.0)
MCH: 30.3 pg (ref 26.0–34.0)
MCHC: 31.4 g/dL (ref 30.0–36.0)
MCV: 96.5 fL (ref 80.0–100.0)
Platelets: 191 10*3/uL (ref 150–400)
RBC: 3.7 MIL/uL — ABNORMAL LOW (ref 3.87–5.11)
RDW: 12.9 % (ref 11.5–15.5)
WBC: 5.8 10*3/uL (ref 4.0–10.5)
nRBC: 0 % (ref 0.0–0.2)

## 2020-06-18 LAB — BASIC METABOLIC PANEL
Anion gap: 7 (ref 5–15)
BUN: 14 mg/dL (ref 8–23)
CO2: 30 mmol/L (ref 22–32)
Calcium: 9.6 mg/dL (ref 8.9–10.3)
Chloride: 105 mmol/L (ref 98–111)
Creatinine, Ser: 1 mg/dL (ref 0.44–1.00)
GFR, Estimated: 59 mL/min — ABNORMAL LOW (ref >60)
Glucose, Bld: 195 mg/dL — ABNORMAL HIGH (ref 70–99)
Potassium: 4.3 mmol/L (ref 3.5–5.1)
Sodium: 142 mmol/L (ref 135–145)

## 2020-06-18 LAB — URINALYSIS, COMPLETE (UACMP) WITH MICROSCOPIC
Bacteria, UA: NONE SEEN
Bilirubin Urine: NEGATIVE
Glucose, UA: NEGATIVE mg/dL
Hgb urine dipstick: NEGATIVE
Ketones, ur: NEGATIVE mg/dL
Nitrite: NEGATIVE
Protein, ur: NEGATIVE mg/dL
Specific Gravity, Urine: 1.01 (ref 1.005–1.030)
pH: 7 (ref 5.0–8.0)

## 2020-06-18 MED ORDER — SODIUM CHLORIDE 0.9 % IV SOLN
1000.0000 mL | Freq: Once | INTRAVENOUS | Status: AC
  Administered 2020-06-18: 1000 mL via INTRAVENOUS

## 2020-06-18 NOTE — ED Triage Notes (Signed)
Pt comes with c/o dizziness and multiple falls recently. Pt states this started 3 weeks ago. Pt states she fell on Friday and hit her head. Pt states the back of her head hurts.  Pt states daily aspirin. Pt recently started on iron from new dx of anemia. Pt wears 4L [place] chronically. Pt recently drinking Pedialyte because of dehydration from MD.

## 2020-06-18 NOTE — ED Notes (Signed)
Patient returned from MRI.

## 2020-06-18 NOTE — ED Provider Notes (Signed)
[facility] Emergency Department Provider Note   ____________________________________________    I have reviewed the triage vital signs and the nursing notes.   HISTORY  Chief Complaint Dizziness     HPI Kristin [name] is a 75 y.o. female who presents with complaints of dizziness.  Patient reports she has been off balance for nearly 3 weeks although it seems to be have been worse over the weekend, she did fall and hit her head on Friday reportedly.  She denies neuro deficits.  She reports that she does not get lightheaded, no palpitations or chest pain.  She notes just feeling dizzy ".  Currently is asymptomatic.  No new medications, has not take anything for this  Past Medical History:  Diagnosis Date  . Asthma   . Cancer [facility]) 2010   Left ear cancer  . COPD (chronic obstructive pulmonary disease) ([place])   . COVID-19 05/27/2019  . Diabetes mellitus without complication ([place])   . Emphysema lung ([place])   . Hypertension   . Stroke [facility])     Patient Active Problem List   Diagnosis Date Noted  . Acute exacerbation of chronic obstructive pulmonary disease (COPD) ([place]) 02/22/2020  . RLQ abdominal pain 07/22/2019  . COPD exacerbation ([place]) 07/19/2019  . Acute respiratory failure due to COVID-19 ([place]) 05/30/2019  . Carotid stenosis 10/26/2017  . Diabetes ([place]) 10/26/2017  . Essential hypertension 10/26/2017  . COPD (chronic obstructive pulmonary disease) ([place]) 10/26/2017  . Pyelonephritis 09/02/2017  . UTI (urinary tract infection) 11/30/2016  . Encephalopathy acute 11/30/2016  . Chronic respiratory failure with hypoxia ([place]) 11/30/2016  . Weakness generalized 11/30/2016  . Acute encephalopathy 11/30/2016  . Acute respiratory failure ([place]) 05/23/2016  . Malnutrition of moderate degree 05/23/2016  . Syncope 10/23/2015    Past Surgical History:  Procedure Laterality Date  . ABDOMINAL HYSTERECTOMY    . BREAST BIOPSY Left 1990   neg  .  CHOLECYSTECTOMY    . COLONOSCOPY WITH PROPOFOL N/A 12/01/2017   Procedure: COLONOSCOPY WITH PROPOFOL;  Surgeon: Toledo, Benay Pike, MD;  Location: [facility] ENDOSCOPY;  Service: Gastroenterology;  Laterality: N/A;  . ESOPHAGOGASTRODUODENOSCOPY N/A 12/01/2017   Procedure: ESOPHAGOGASTRODUODENOSCOPY (EGD);  Surgeon: Toledo, Benay Pike, MD;  Location: [facility] ENDOSCOPY;  Service: Gastroenterology;  Laterality: N/A;  . NECK SURGERY      Prior to Admission medications   Medication Sig Start Date End Date Taking? Authorizing Provider  albuterol (PROVENTIL) (2.5 MG/3ML) 0.083% nebulizer solution Take 2.5 mg by nebulization every 6 (six) hours. 01/23/20   [provider]  alendronate (FOSAMAX) 70 MG tablet Take 70 mg by mouth once a week. 12/16/19   [provider]  amLODipine (NORVASC) 10 MG tablet Take 1 tablet (10 mg total) by mouth daily. 06/04/19   Ghimire, Henreitta Leber, MD  atorvastatin (LIPITOR) 40 MG tablet Take 40 mg by mouth daily.     [provider]  citalopram (CELEXA) 20 MG tablet Take 20 mg by mouth daily. 11/29/19   [provider]  clonazePAM (KLONOPIN) 1 MG tablet Take 1 mg by mouth in the morning.     [provider]  famotidine (PEPCID) 20 MG tablet Take 1 tablet (20 mg total) by mouth daily. 06/04/19   Ghimire, Henreitta Leber, MD  fenofibrate 160 MG tablet Take 160 mg by mouth daily.  10/06/15   [provider]  hydrALAZINE (APRESOLINE) 50 MG tablet Take 0.5 tablets (25 mg total) by mouth 2 (two) times daily. 07/19/19  Enzo Bi, MD  ipratropium-albuterol (DUONEB) 0.5-2.5 (3) MG/3ML SOLN Take 3 mLs by nebulization in the morning and at bedtime. 02/19/20   Triplett, Johnette Abraham B, FNP  levothyroxine (SYNTHROID, LEVOTHROID) 112 MCG tablet Take 112 mcg by mouth daily before breakfast.  09/15/15   [provider]  loperamide (IMODIUM) 2 MG capsule Take 1 capsule (2 mg total) by mouth as needed for diarrhea or loose stools. 02/26/20   Kayleen Memos, DO   losartan (COZAAR) 100 MG tablet Take 100 mg by mouth daily.    [provider]  metFORMIN (GLUCOPHAGE) 500 MG tablet Take 500 mg by mouth 2 (two) times daily with a meal.     [provider]  montelukast (SINGULAIR) 10 MG tablet Take 10 mg by mouth at bedtime.  06/06/17   [provider]  OXYGEN Inhale 4 L/min into the lungs continuous.     [provider]  pantoprazole (PROTONIX) 40 MG tablet Take 1 tablet by mouth 2 (two) times daily with breakfast and lunch. 10/20/15   [provider]  pramipexole (MIRAPEX) 0.5 MG tablet Take 0.5 mg by mouth in the morning and at bedtime.    [provider]  SPIRIVA HANDIHALER 18 MCG inhalation capsule Place 18 mcg into inhaler and inhale daily. 05/13/16   [provider]  traZODone (DESYREL) 150 MG tablet Take 150 mg by mouth at bedtime. 08/14/17   [provider]  VENTOLIN HFA 108 (90 Base) MCG/ACT inhaler Inhale 2 puffs into the lungs every 3 (three) hours as needed for wheezing or shortness of breath.  10/11/15   [provider]     Allergies Codeine, Morphine and related, and Prednisone  Family History  Problem Relation Age of Onset  . Ovarian cancer Mother   . Lung cancer Father   . Breast cancer Paternal Aunt     Social History Social History   Tobacco Use  . Smoking status: Former Research scientist (life sciences)  . Smokeless tobacco: Never Used  Vaping Use  . Vaping Use: Never used  Substance Use Topics  . Alcohol use: No  . Drug use: No    Review of Systems  Constitutional: No fever/chills Eyes: No visual changes.  ENT: No sore throat. Cardiovascular: Denies chest pain. Respiratory: Denies shortness of breath. Gastrointestinal: No abdominal pain.  No nausea, no vomiting.   Genitourinary: Negative for dysuria. Musculoskeletal: Negative for back pain. Skin: Negative for rash. Neurological: As above   ____________________________________________   PHYSICAL EXAM:  VITAL  SIGNS: ED Triage Vitals  Enc Vitals Group     BP 06/18/20 0825 (!) 157/78     Pulse Rate 06/18/20 0825 78     Resp 06/18/20 0825 18     Temp 06/18/20 0825 98 F (36.7 C)     Temp Source 06/18/20 0825 Oral     SpO2 06/18/20 0825 95 %     Weight 06/18/20 0826 59 kg (130 lb)     Height 06/18/20 0826 1.626 m (5\' 4" )     Head Circumference --      Peak Flow --      Pain Score 06/18/20 0826 5     Pain Loc --      Pain Edu? --      Excl. in [place]? --     Constitutional: Alert and oriented. No acute distress Eyes: Conjunctivae are normal.  Head: Atraumatic.  Mouth/Throat: Mucous membranes are moist.   Neck:  Painless ROM Cardiovascular: Normal rate, regular rhythm.  Good  peripheral circulation. Respiratory: Normal respiratory effort.  No retractions. Lungs CTAB. Gastrointestinal: Soft and nontender. No distention.  No CVA tenderness.  Musculoskeletal: No lower extremity tenderness nor edema.  Warm and well perfused Neurologic:  Normal speech and language. No gross focal neurologic deficits are appreciated.  Skin:  Skin is warm, dry and intact. No rash noted. Psychiatric: Mood and affect are normal. Speech and behavior are normal.  ____________________________________________   LABS (all labs ordered are listed, but only abnormal results are displayed)  Labs Reviewed  BASIC METABOLIC PANEL - Abnormal; Notable for the following components:      Result Value   Glucose, Bld 195 (*)    GFR, Estimated 59 (*)    All other components within normal limits  CBC - Abnormal; Notable for the following components:   RBC 3.70 (*)    Hemoglobin 11.2 (*)    HCT 35.7 (*)    All other components within normal limits  URINALYSIS, COMPLETE (UACMP) WITH MICROSCOPIC - Abnormal; Notable for the following components:   Color, Urine YELLOW (*)    APPearance CLEAR (*)    Leukocytes,Ua MODERATE (*)    All other components within normal limits    ____________________________________________  EKG  ED ECG REPORT I, Lavonia Drafts, the attending physician, personally viewed and interpreted this ECG.  Date: 06/18/2020  Rhythm: normal sinus rhythm QRS Axis: normal Intervals: normal ST/T Wave abnormalities: normal Narrative Interpretation: no evidence of acute ischemia  ____________________________________________  RADIOLOGY  CT head without acute abnormality, reviewed by me MR brain without acute abnormality ____________________________________________   PROCEDURES  Procedure(s) performed: No  Procedures   Critical Care performed: No ____________________________________________   INITIAL IMPRESSION / ASSESSMENT AND PLAN / ED COURSE  Pertinent labs & imaging results that were available during my care of the patient were reviewed by me and considered in my medical decision making (see chart for details).  Patient well-appearing and in no acute distress, asymptomatic here today.  Does describe likely vertigo symptoms, questionable ataxia.  Lab work is reassuring, IV fluids given  CT head without evidence of acute abnormality, MRI is also normal.  Patient reassured by results, recommend outpatient follow-up for further evaluation of likely vertigo.    ____________________________________________   FINAL CLINICAL IMPRESSION(S) / ED DIAGNOSES  Final diagnoses:  Dizziness  Vertigo        Note:  This document was prepared using Dragon voice recognition software and may include unintentional dictation errors.   Lavonia Drafts, MD 06/18/20 [phone]

## 2020-06-18 NOTE — ED Notes (Signed)
Patient ambulated to and from restroom, unsteady gait, 1 person assist.

## 2020-06-18 NOTE — ED Notes (Signed)
Pt to ED c/o dizziness and weakness for "past few months" that has became worse with 3 recent falls ("atleast once a week" from losing consciousness after feeling dizzy. Last fall was 3d ago, pt was unable to get up and was lying on floor until family member could help her up ("a few hours" on the floor). Pt is on 4L oxygen, has chronic COPD and always wears 4L.

## 2020-06-18 NOTE — ED Notes (Signed)
Patient transported to MRI 

## 2021-05-04 ENCOUNTER — Other Ambulatory Visit: Payer: Self-pay

## 2021-05-04 ENCOUNTER — Emergency Department: Payer: Medicare Other

## 2021-05-04 ENCOUNTER — Inpatient Hospital Stay
Admission: EM | Admit: 2021-05-04 | Discharge: 2021-05-07 | DRG: 190 | Disposition: A | Discharge status: Home Health | Payer: Medicare Other | Attending: Internal Medicine | Admitting: Internal Medicine

## 2021-05-04 DIAGNOSIS — Z8041 Family history of malignant neoplasm of ovary: Secondary | ICD-10-CM

## 2021-05-04 DIAGNOSIS — T380X5A Adverse effect of glucocorticoids and synthetic analogues, initial encounter: Secondary | ICD-10-CM | POA: Diagnosis present

## 2021-05-04 DIAGNOSIS — Z20822 Contact with and (suspected) exposure to covid-19: Secondary | ICD-10-CM | POA: Diagnosis present

## 2021-05-04 DIAGNOSIS — Z8616 Personal history of COVID-19: Secondary | ICD-10-CM

## 2021-05-04 DIAGNOSIS — F324 Major depressive disorder, single episode, in partial remission: Secondary | ICD-10-CM | POA: Diagnosis present

## 2021-05-04 DIAGNOSIS — J439 Emphysema, unspecified: Principal | ICD-10-CM | POA: Diagnosis present

## 2021-05-04 DIAGNOSIS — Z888 Allergy status to other drugs, medicaments and biological substances status: Secondary | ICD-10-CM

## 2021-05-04 DIAGNOSIS — Z885 Allergy status to narcotic agent status: Secondary | ICD-10-CM

## 2021-05-04 DIAGNOSIS — K219 Gastro-esophageal reflux disease without esophagitis: Secondary | ICD-10-CM | POA: Diagnosis present

## 2021-05-04 DIAGNOSIS — Z7984 Long term (current) use of oral hypoglycemic drugs: Secondary | ICD-10-CM

## 2021-05-04 DIAGNOSIS — E119 Type 2 diabetes mellitus without complications: Secondary | ICD-10-CM

## 2021-05-04 DIAGNOSIS — Z79899 Other long term (current) drug therapy: Secondary | ICD-10-CM

## 2021-05-04 DIAGNOSIS — R0602 Shortness of breath: Secondary | ICD-10-CM | POA: Diagnosis not present

## 2021-05-04 DIAGNOSIS — Z87891 Personal history of nicotine dependence: Secondary | ICD-10-CM

## 2021-05-04 DIAGNOSIS — I10 Essential (primary) hypertension: Secondary | ICD-10-CM | POA: Diagnosis present

## 2021-05-04 DIAGNOSIS — R531 Weakness: Secondary | ICD-10-CM | POA: Diagnosis present

## 2021-05-04 DIAGNOSIS — E039 Hypothyroidism, unspecified: Secondary | ICD-10-CM | POA: Diagnosis present

## 2021-05-04 DIAGNOSIS — F329 Major depressive disorder, single episode, unspecified: Secondary | ICD-10-CM | POA: Diagnosis present

## 2021-05-04 DIAGNOSIS — Z79890 Hormone replacement therapy (postmenopausal): Secondary | ICD-10-CM

## 2021-05-04 DIAGNOSIS — R0603 Acute respiratory distress: Secondary | ICD-10-CM | POA: Diagnosis present

## 2021-05-04 DIAGNOSIS — J9811 Atelectasis: Secondary | ICD-10-CM | POA: Diagnosis present

## 2021-05-04 DIAGNOSIS — Z8673 Personal history of transient ischemic attack (TIA), and cerebral infarction without residual deficits: Secondary | ICD-10-CM

## 2021-05-04 DIAGNOSIS — J9621 Acute and chronic respiratory failure with hypoxia: Secondary | ICD-10-CM | POA: Diagnosis present

## 2021-05-04 DIAGNOSIS — Z801 Family history of malignant neoplasm of trachea, bronchus and lung: Secondary | ICD-10-CM

## 2021-05-04 DIAGNOSIS — I6529 Occlusion and stenosis of unspecified carotid artery: Secondary | ICD-10-CM | POA: Diagnosis present

## 2021-05-04 DIAGNOSIS — J441 Chronic obstructive pulmonary disease with (acute) exacerbation: Secondary | ICD-10-CM | POA: Diagnosis present

## 2021-05-04 DIAGNOSIS — R339 Retention of urine, unspecified: Secondary | ICD-10-CM | POA: Diagnosis present

## 2021-05-04 LAB — RESP PANEL BY RT-PCR (FLU A&B, COVID) ARPGX2
Influenza A by PCR: NEGATIVE
Influenza B by PCR: NEGATIVE
SARS Coronavirus 2 by RT PCR: NEGATIVE

## 2021-05-04 LAB — CBC WITH DIFFERENTIAL/PLATELET
Abs Immature Granulocytes: 0.05 10*3/uL (ref 0.00–0.07)
Basophils Absolute: 0.1 10*3/uL (ref 0.0–0.1)
Basophils Relative: 1 %
Eosinophils Absolute: 0.2 10*3/uL (ref 0.0–0.5)
Eosinophils Relative: 2 %
HCT: 35.9 % — ABNORMAL LOW (ref 36.0–46.0)
Hemoglobin: 11.3 g/dL — ABNORMAL LOW (ref 12.0–15.0)
Immature Granulocytes: 1 %
Lymphocytes Relative: 23 %
Lymphs Abs: 2.5 10*3/uL (ref 0.7–4.0)
MCH: 30.6 pg (ref 26.0–34.0)
MCHC: 31.5 g/dL (ref 30.0–36.0)
MCV: 97.3 fL (ref 80.0–100.0)
Monocytes Absolute: 0.8 10*3/uL (ref 0.1–1.0)
Monocytes Relative: 7 %
Neutro Abs: 7.4 10*3/uL (ref 1.7–7.7)
Neutrophils Relative %: 66 %
Platelets: 187 10*3/uL (ref 150–400)
RBC: 3.69 MIL/uL — ABNORMAL LOW (ref 3.87–5.11)
RDW: 13.3 % (ref 11.5–15.5)
WBC: 11.1 10*3/uL — ABNORMAL HIGH (ref 4.0–10.5)
nRBC: 0 % (ref 0.0–0.2)

## 2021-05-04 LAB — COMPREHENSIVE METABOLIC PANEL
ALT: 12 U/L (ref 0–44)
AST: 15 U/L (ref 15–41)
Albumin: 4.3 g/dL (ref 3.5–5.0)
Alkaline Phosphatase: 30 U/L — ABNORMAL LOW (ref 38–126)
Anion gap: 9 (ref 5–15)
BUN: 14 mg/dL (ref 8–23)
CO2: 28 mmol/L (ref 22–32)
Calcium: 9.3 mg/dL (ref 8.9–10.3)
Chloride: 101 mmol/L (ref 98–111)
Creatinine, Ser: 0.87 mg/dL (ref 0.44–1.00)
GFR, Estimated: >60 mL/min (ref >60)
Glucose, Bld: 239 mg/dL — ABNORMAL HIGH (ref 70–99)
Potassium: 3.7 mmol/L (ref 3.5–5.1)
Sodium: 138 mmol/L (ref 135–145)
Total Bilirubin: 0.7 mg/dL (ref 0.3–1.2)
Total Protein: 6.5 g/dL (ref 6.5–8.1)

## 2021-05-04 LAB — BLOOD GAS, VENOUS
Acid-Base Excess: 3.9 mmol/L — ABNORMAL HIGH (ref 0.0–2.0)
Bicarbonate: 30.2 mmol/L — ABNORMAL HIGH (ref 20.0–28.0)
Delivery systems: POSITIVE
FIO2: 35 %
O2 Saturation: 61 %
Patient temperature: 37
pCO2, Ven: 51 mmHg (ref 44–60)
pH, Ven: 7.38 (ref 7.25–7.43)
pO2, Ven: 37 mmHg (ref 32–45)

## 2021-05-04 LAB — BRAIN NATRIURETIC PEPTIDE: B Natriuretic Peptide: 60.2 pg/mL (ref 0.0–100.0)

## 2021-05-04 LAB — TROPONIN I (HIGH SENSITIVITY): Troponin I (High Sensitivity): 12 ng/L (ref <18)

## 2021-05-04 MED ORDER — SODIUM CHLORIDE 0.9 % IV BOLUS
500.0000 mL | Freq: Once | INTRAVENOUS | Status: AC
  Administered 2021-05-04: 500 mL via INTRAVENOUS

## 2021-05-04 MED ORDER — IPRATROPIUM-ALBUTEROL 0.5-2.5 (3) MG/3ML IN SOLN
3.0000 mL | Freq: Once | RESPIRATORY_TRACT | Status: AC
  Administered 2021-05-04: 3 mL via RESPIRATORY_TRACT
  Filled 2021-05-04: qty 3

## 2021-05-04 MED ORDER — ONDANSETRON HCL 4 MG/2ML IJ SOLN
4.0000 mg | Freq: Once | INTRAMUSCULAR | Status: AC
  Administered 2021-05-04: 4 mg via INTRAVENOUS
  Filled 2021-05-04: qty 2

## 2021-05-04 NOTE — ED Triage Notes (Signed)
Pt called EMS for increased SOB since Wednesday, on 4L [place] on home. EMS gave 2gm Mag drip, 125mg  SoluMedrol, and 1 duoneb. Pt placed on Cpap with reported improvement

## 2021-05-04 NOTE — ED Provider Notes (Signed)
[facility] Provider Note    Event Date/Time   First MD Initiated Contact with Patient 05/04/21 2200     (approximate)   History   Shortness of Breath   HPI  Level 5 caveat: History of present illness limited due to respiratory distress and BiPAP  Kristin [name] is a 76 y.o. female with a history of COPD on 4 L home O2, diabetes, and hypertension, who presents with shortness of breath over the last 3 days associated with cough and wheezing.  She denies any acute pain but states she is feeling nauseous.  EMS gave a DuoNeb, Solu-Medrol, and magnesium, and placed the patient on CPAP.  Physical Exam   Triage Vital Signs: ED Triage Vitals  Enc Vitals Group     BP 05/04/21 2201 (!) 163/73     Pulse Rate 05/04/21 2201 95     Resp 05/04/21 2201 (!) 24     Temp 05/04/21 2201 98.3 F (36.8 C)     Temp Source 05/04/21 2201 Axillary     SpO2 05/04/21 2157 98 %     Weight --      Height --      Head Circumference --      Peak Flow --      Pain Score 05/04/21 2201 0     Pain Loc --      Pain Edu? --      Excl. in [place]? --     Most recent vital signs: Vitals:   05/04/21 2200 05/04/21 2201  BP:  (!) 163/73  Pulse:  95  Resp:  (!) 24  Temp:  98.3 F (36.8 C)  SpO2: 97% 100%     General: Alert, somewhat weak appearing. CV:  Good peripheral perfusion.  Normal heart sounds. Resp:  Increased respiratory effort with accessory muscle use.  Diminished breath sounds bilaterally with faint scattered wheezing. Abd:  No distention.  Soft and nontender. Other:  No peripheral edema.  Nonfocal neurologic exam.   ED Results / Procedures / Treatments   Labs (all labs ordered are listed, but only abnormal results are displayed) Labs Reviewed  COMPREHENSIVE METABOLIC PANEL - Abnormal; Notable for the following components:      Result Value   Glucose, Bld 239 (*)    Alkaline Phosphatase 30 (*)    All other components within normal limits  CBC WITH  DIFFERENTIAL/PLATELET - Abnormal; Notable for the following components:   WBC 11.1 (*)    RBC 3.69 (*)    Hemoglobin 11.3 (*)    HCT 35.9 (*)    All other components within normal limits  BLOOD GAS, VENOUS - Abnormal; Notable for the following components:   Bicarbonate 30.2 (*)    Acid-Base Excess 3.9 (*)    All other components within normal limits  RESP PANEL BY RT-PCR (FLU A&B, COVID) ARPGX2  BRAIN NATRIURETIC PEPTIDE  URINALYSIS, ROUTINE W REFLEX MICROSCOPIC  TROPONIN I (HIGH SENSITIVITY)     EKG  ED ECG REPORT I, Arta Silence, the attending physician, personally viewed and interpreted this ECG.  Date: 05/04/2021 EKG Time: 2205 Rate: 93 Rhythm: normal sinus rhythm QRS Axis: normal Intervals: LAFB ST/T Wave abnormalities: LVH with nonspecific abnormalities Narrative Interpretation: Nonspecific abnormalities with no evidence of acute ischemia    RADIOLOGY  Chest x-ray: I independently viewed and interpreted the images; there is no evidence of focal consolidation or edema  PROCEDURES:  Critical Care performed: Yes, see critical care procedure note(s)  .Critical  Care Performed by: Arta Silence, MD Authorized by: Arta Silence, MD   Critical care provider statement:    Critical care time (minutes):  30   Critical care was necessary to treat or prevent imminent or life-threatening deterioration of the following conditions:  Respiratory failure   Critical care was time spent personally by me on the following activities:  Development of treatment plan with patient or surrogate, discussions with consultants, evaluation of patient's response to treatment, examination of patient, ordering and review of laboratory studies, ordering and review of radiographic studies, ordering and performing treatments and interventions, pulse oximetry, re-evaluation of patient's condition and review of old charts   Care discussed with: admitting provider     MEDICATIONS  ORDERED IN ED: Medications  ipratropium-albuterol (DUONEB) 0.5-2.5 (3) MG/3ML nebulizer solution 3 mL (3 mLs Nebulization Given 05/04/21 2216)  ipratropium-albuterol (DUONEB) 0.5-2.5 (3) MG/3ML nebulizer solution 3 mL (3 mLs Nebulization Given 05/04/21 2215)  sodium chloride 0.9 % bolus 500 mL (500 mLs Intravenous New Bag/Given 05/04/21 2218)  ondansetron (ZOFRAN) injection 4 mg (4 mg Intravenous Given 05/04/21 2214)     IMPRESSION / MDM / [place] / ED COURSE  I reviewed the triage vital signs and the nursing notes.  76 year old female with PMH as noted above including COPD on 4 L home O2 presents with worsening shortness of breath over the last several days.  She was found to be hypoxic by EMS and placed on CPAP.  She was also given steroid, bronchodilators, and magnesium.  I reviewed the past medical records; hospitalist discharge summary from the most recent admission in December 2021 indicates that the patient was admitted for COPD exacerbation at that time.    On exam in the ED, the patient demonstrates increased work of breathing and is speaking in short sentences.  She has no peripheral edema.  Breath sounds are diminished bilaterally with some faint wheezing.  Differential diagnosis includes, but is not limited to, COPD exacerbation, pneumonia, acute bronchitis, COVID-19 or other viral etiology, or less likely cardiac cause.  We have switched the patient over to BiPAP.  We will continue to give additional bronchodilators, a fluid bolus, and obtain chest x-ray and lab work-up.  I anticipate admission.  The patient is on the cardiac monitor to evaluate for evidence of arrhythmia and/or significant heart rate changes.  ----------------------------------------- 11:16 PM on 05/04/2021 -----------------------------------------  Patient is stable on BiPAP and appears improved.  Chest x-ray does not show focal consolidation or edema.  Rest regarding panel is negative.  Lab work-up  is overall reassuring, with negative troponin and BNP, PCO2 51, and unremarkable CBC and CMP.  I consulted Dr. Posey Pronto from the hospitalist service; based on her discussion she agrees to admit the patient.    FINAL CLINICAL IMPRESSION(S) / ED DIAGNOSES   Final diagnoses:  Acute on chronic respiratory failure with hypoxia (HCC)  COPD exacerbation ([place])     Rx / DC Orders   ED Discharge Orders     None        Note:  This document was prepared using Dragon voice recognition software and may include unintentional dictation errors.    Arta Silence, MD 05/04/21 2316

## 2021-05-04 NOTE — H&P (Signed)
History and Physical    Patient: Kristin [name] XMI:[id number] DOB: January 03, 1946 DOA: 05/04/2021 DOS: the patient was seen and examined on 05/05/2021 PCP: Tracie Harrier, MD  Patient coming from: Home  Chief Complaint:  Chief Complaint  Patient presents with   Shortness of Breath    HPI: Kristin [name] is a 76 y.o. female with medical history significant of COPD presenting to [place] for shortness of breath.  Patient brought by EMS for respiratory distress and on BiPAP.  Patient was wheezing on initial evaluation and was started on BiPAP by EMS was given Solu-Medrol by EMS.  Patient has COPD and is on 4 L at home at baseline.  History today of worsening cough and wheezing since the past few days.  Review of Systems: . Review of Systems  Constitutional: Negative.   HENT: Negative.    Eyes: Negative.   Respiratory:  Positive for cough, shortness of breath and wheezing.   Cardiovascular: Negative.   Gastrointestinal: Negative.   Genitourinary: Negative.   Musculoskeletal: Negative.   Skin: Negative.   Neurological: Negative.   Endo/Heme/Allergies: Negative.   Psychiatric/Behavioral: Negative.    All other systems reviewed and are negative.  Past Medical History:  Diagnosis Date   Asthma    Cancer ([place]) 2010   Left ear cancer   COPD (chronic obstructive pulmonary disease) ([place])    COVID-19 05/27/2019   Diabetes mellitus without complication (HCC)    Emphysema lung ([place])    Hypertension    Stroke [facility])    Past Surgical History:  Procedure Laterality Date   ABDOMINAL HYSTERECTOMY     BREAST BIOPSY Left 1990   neg   CHOLECYSTECTOMY     COLONOSCOPY WITH PROPOFOL N/A 12/01/2017   Procedure: COLONOSCOPY WITH PROPOFOL;  Surgeon: Toledo, Benay Pike, MD;  Location: [facility] ENDOSCOPY;  Service: Gastroenterology;  Laterality: N/A;   ESOPHAGOGASTRODUODENOSCOPY N/A 12/01/2017   Procedure: ESOPHAGOGASTRODUODENOSCOPY (EGD);  Surgeon: Toledo, Benay Pike, MD;  Location: [facility]  ENDOSCOPY;  Service: Gastroenterology;  Laterality: N/A;   NECK SURGERY     Social History:  reports that she has quit smoking. She has never used smokeless tobacco. She reports that she does not drink alcohol and does not use drugs.  Allergies  Allergen Reactions   Codeine Nausea Only    Can take along with medication for nausea   Morphine And Related Nausea Only    Can take along with medication for nausea   Prednisone Other (See Comments)    Elevated BGL into the 300's    Family History  Problem Relation Age of Onset   Ovarian cancer Mother    Lung cancer Father    Breast cancer Paternal Aunt     Prior to Admission medications   Medication Sig Start Date End Date Taking? Authorizing Provider  albuterol (PROVENTIL) (2.5 MG/3ML) 0.083% nebulizer solution Take 2.5 mg by nebulization every 6 (six) hours. 01/23/20   [provider]  alendronate (FOSAMAX) 70 MG tablet Take 70 mg by mouth once a week. 12/16/19   [provider]  amLODipine (NORVASC) 10 MG tablet Take 1 tablet (10 mg total) by mouth daily. 06/04/19   Ghimire, Henreitta Leber, MD  atorvastatin (LIPITOR) 40 MG tablet Take 40 mg by mouth daily.     [provider]  citalopram (CELEXA) 20 MG tablet Take 20 mg by mouth daily. 11/29/19   [provider]  clonazePAM (KLONOPIN) 1 MG tablet Take 1 mg by mouth in the morning.  [provider]  famotidine (PEPCID) 20 MG tablet Take 1 tablet (20 mg total) by mouth daily. 06/04/19   Ghimire, Henreitta Leber, MD  fenofibrate 160 MG tablet Take 160 mg by mouth daily.  10/06/15   [provider]  hydrALAZINE (APRESOLINE) 50 MG tablet Take 0.5 tablets (25 mg total) by mouth 2 (two) times daily. 07/19/19   Enzo Bi, MD  ipratropium-albuterol (DUONEB) 0.5-2.5 (3) MG/3ML SOLN Take 3 mLs by nebulization in the morning and at bedtime. 02/19/20   Triplett, Johnette Abraham B, FNP  levothyroxine (SYNTHROID, LEVOTHROID) 112 MCG tablet Take 112 mcg by mouth daily before  breakfast.  09/15/15   [provider]  loperamide (IMODIUM) 2 MG capsule Take 1 capsule (2 mg total) by mouth as needed for diarrhea or loose stools. 02/26/20   Kayleen Memos, DO  losartan (COZAAR) 100 MG tablet Take 100 mg by mouth daily.    [provider]  metFORMIN (GLUCOPHAGE) 500 MG tablet Take 500 mg by mouth 2 (two) times daily with a meal.     [provider]  montelukast (SINGULAIR) 10 MG tablet Take 10 mg by mouth at bedtime.  06/06/17   [provider]  OXYGEN Inhale 4 L/min into the lungs continuous.     [provider]  pantoprazole (PROTONIX) 40 MG tablet Take 1 tablet by mouth 2 (two) times daily with breakfast and lunch. 10/20/15   [provider]  pramipexole (MIRAPEX) 0.5 MG tablet Take 0.5 mg by mouth in the morning and at bedtime.    [provider]  SPIRIVA HANDIHALER 18 MCG inhalation capsule Place 18 mcg into inhaler and inhale daily. 05/13/16   [provider]  traZODone (DESYREL) 150 MG tablet Take 150 mg by mouth at bedtime. 08/14/17   [provider]  VENTOLIN HFA 108 (90 Base) MCG/ACT inhaler Inhale 2 puffs into the lungs every 3 (three) hours as needed for wheezing or shortness of breath.  10/11/15   [provider]    Physical Exam: Vitals:   05/04/21 2300 05/04/21 2330 05/05/21 0000 05/05/21 0030  BP: (!) 166/93 (!) 154/59 (!) 152/63 (!) 151/65  Pulse: 95 89 92 85  Resp: (!) 37 17 19 15   Temp:      TempSrc:      SpO2: 100% 98% 97% 99%  Physical Exam Vitals reviewed.  Constitutional:      General: She is in acute distress.     Appearance: She is ill-appearing.  HENT:     Head: Normocephalic and atraumatic.     Right Ear: External ear normal.     Left Ear: External ear normal.     Nose: Nose normal.  Eyes:     Extraocular Movements: Extraocular movements intact.     Pupils: Pupils are equal, round, and reactive to light.  Cardiovascular:     Rate and Rhythm: Normal  rate and regular rhythm.     Pulses: Normal pulses.     Heart sounds: Normal heart sounds.  Pulmonary:     Effort: Pulmonary effort is normal.     Breath sounds: Wheezing present.  Abdominal:     General: Bowel sounds are normal.     Palpations: Abdomen is soft.  Musculoskeletal:     Cervical back: Normal range of motion.     Right lower leg: No edema.     Left lower leg: No edema.  Skin:    General: Skin is warm.  Neurological:     General:  No focal deficit present.     Mental Status: She is alert and oriented to person, place, and time.  Psychiatric:        Mood and Affect: Mood normal.        Behavior: Behavior normal.     Data Reviewed: > VBG shows a pH of 7.38 PCO2 of 51 and PO2 of 37. > CMP shows glucose of 239 otherwise normal. > BNP of 16.2 and troponin of 12. > CBC shows a white count of 11.1 hemoglobin of 11.3, platelets of 187 normal differential. > Respiratory panel negative for flu and COVID. > EKG shows sinus rhythm 93 normal axis and LVH. > CT chest PE protocol -pending > Patient received Solu-Medrol 125 by EMS and DuoNeb therapy in the emergency room. > Chest x-ray done in the ED does not show any infiltrates or edema.  Assessment and Plan: Shortness of breath/respiratory distress/acute COPD: -Stat CT angio obtained secondary to patient's respiratory distress is negative for any pulmonary embolism but does show some basilar scarring and atelectasis. -Attribute patient's respiratory distress to her COPD exacerbation and we will continue patient on Solu-Medrol and DuoNeb nebulizers and BiPAP therapy as needed. -Respiratory therapy consult.  Diabetes mellitus type 2:  -Currently patient n.p.o. and therefore glycemic protocol is every 6 hours unrelated to diet. -A.m. team to changes patient is able to get off of BiPAP and tolerate diet.  Hypertension: Blood pressure (!) 151/65, pulse 85, temperature 98.3 F (36.8 C), temperature source Axillary, resp. rate  15, SpO2 99 %. Patient continued on losartan, hydralazine, amlodipine.   GERD: Protonix 40 mg twice daily continued.   Hypothyroidism: Levothyroxine 112 mcg p.o. continued.  Major depression: Patient continued on citalopram.   Carotid stenosis: Patient continued on atorvastatin 40 mg.   Advance Care Planning:   Code Status: Full Code   Consults:  None    Family Communication:  Jenevie, Casstevens (Spouse)  [phone] ([place])  Severity of Illness: The appropriate patient status for this patient is INPATIENT. Inpatient status is judged to be reasonable and necessary in order to provide the required intensity of service to ensure the patient's safety. The patient's presenting symptoms, physical exam findings, and initial radiographic and laboratory data in the context of their chronic comorbidities is felt to place them at high risk for further clinical deterioration. Furthermore, it is not anticipated that the patient will be medically stable for discharge from the hospital within 2 midnights of admission.   * I certify that at the point of admission it is my clinical judgment that the patient will require inpatient hospital care spanning beyond 2 midnights from the point of admission due to high intensity of service, high risk for further deterioration and high frequency of surveillance required.*  Author: Para Skeans, MD 05/05/2021 2:00 AM  For on call review [email or web address].

## 2021-05-05 ENCOUNTER — Inpatient Hospital Stay: Payer: Medicare Other

## 2021-05-05 ENCOUNTER — Inpatient Hospital Stay
Admit: 2021-05-05 | Discharge: 2021-05-05 | Disposition: A | Discharge status: Home / Self Care | Payer: Medicare Other | Attending: Internal Medicine | Admitting: Internal Medicine

## 2021-05-05 DIAGNOSIS — R0603 Acute respiratory distress: Secondary | ICD-10-CM | POA: Diagnosis present

## 2021-05-05 DIAGNOSIS — T380X5A Adverse effect of glucocorticoids and synthetic analogues, initial encounter: Secondary | ICD-10-CM | POA: Diagnosis present

## 2021-05-05 DIAGNOSIS — Z885 Allergy status to narcotic agent status: Secondary | ICD-10-CM | POA: Diagnosis not present

## 2021-05-05 DIAGNOSIS — R0602 Shortness of breath: Secondary | ICD-10-CM | POA: Diagnosis not present

## 2021-05-05 DIAGNOSIS — Z8041 Family history of malignant neoplasm of ovary: Secondary | ICD-10-CM | POA: Diagnosis not present

## 2021-05-05 DIAGNOSIS — R339 Retention of urine, unspecified: Secondary | ICD-10-CM | POA: Diagnosis present

## 2021-05-05 DIAGNOSIS — Z8616 Personal history of COVID-19: Secondary | ICD-10-CM | POA: Diagnosis not present

## 2021-05-05 DIAGNOSIS — Z888 Allergy status to other drugs, medicaments and biological substances status: Secondary | ICD-10-CM | POA: Diagnosis not present

## 2021-05-05 DIAGNOSIS — J441 Chronic obstructive pulmonary disease with (acute) exacerbation: Secondary | ICD-10-CM | POA: Diagnosis present

## 2021-05-05 DIAGNOSIS — Z79899 Other long term (current) drug therapy: Secondary | ICD-10-CM | POA: Diagnosis not present

## 2021-05-05 DIAGNOSIS — Z20822 Contact with and (suspected) exposure to covid-19: Secondary | ICD-10-CM | POA: Diagnosis present

## 2021-05-05 DIAGNOSIS — E039 Hypothyroidism, unspecified: Secondary | ICD-10-CM | POA: Diagnosis present

## 2021-05-05 DIAGNOSIS — E119 Type 2 diabetes mellitus without complications: Secondary | ICD-10-CM | POA: Diagnosis present

## 2021-05-05 DIAGNOSIS — Z79890 Hormone replacement therapy (postmenopausal): Secondary | ICD-10-CM | POA: Diagnosis not present

## 2021-05-05 DIAGNOSIS — Z7984 Long term (current) use of oral hypoglycemic drugs: Secondary | ICD-10-CM | POA: Diagnosis not present

## 2021-05-05 DIAGNOSIS — Z8673 Personal history of transient ischemic attack (TIA), and cerebral infarction without residual deficits: Secondary | ICD-10-CM | POA: Diagnosis not present

## 2021-05-05 DIAGNOSIS — Z801 Family history of malignant neoplasm of trachea, bronchus and lung: Secondary | ICD-10-CM | POA: Diagnosis not present

## 2021-05-05 DIAGNOSIS — J439 Emphysema, unspecified: Secondary | ICD-10-CM | POA: Diagnosis present

## 2021-05-05 DIAGNOSIS — Z87891 Personal history of nicotine dependence: Secondary | ICD-10-CM | POA: Diagnosis not present

## 2021-05-05 DIAGNOSIS — J9621 Acute and chronic respiratory failure with hypoxia: Secondary | ICD-10-CM | POA: Diagnosis present

## 2021-05-05 DIAGNOSIS — I6529 Occlusion and stenosis of unspecified carotid artery: Secondary | ICD-10-CM | POA: Diagnosis present

## 2021-05-05 DIAGNOSIS — J9811 Atelectasis: Secondary | ICD-10-CM | POA: Diagnosis present

## 2021-05-05 DIAGNOSIS — F329 Major depressive disorder, single episode, unspecified: Secondary | ICD-10-CM | POA: Diagnosis present

## 2021-05-05 DIAGNOSIS — R531 Weakness: Secondary | ICD-10-CM | POA: Diagnosis present

## 2021-05-05 DIAGNOSIS — I10 Essential (primary) hypertension: Secondary | ICD-10-CM | POA: Diagnosis present

## 2021-05-05 DIAGNOSIS — K219 Gastro-esophageal reflux disease without esophagitis: Secondary | ICD-10-CM | POA: Diagnosis present

## 2021-05-05 LAB — CBC
HCT: 32.3 % — ABNORMAL LOW (ref 36.0–46.0)
Hemoglobin: 10.1 g/dL — ABNORMAL LOW (ref 12.0–15.0)
MCH: 30.2 pg (ref 26.0–34.0)
MCHC: 31.3 g/dL (ref 30.0–36.0)
MCV: 96.7 fL (ref 80.0–100.0)
Platelets: 158 10*3/uL (ref 150–400)
RBC: 3.34 MIL/uL — ABNORMAL LOW (ref 3.87–5.11)
RDW: 13.3 % (ref 11.5–15.5)
WBC: 8.6 10*3/uL (ref 4.0–10.5)
nRBC: 0 % (ref 0.0–0.2)

## 2021-05-05 LAB — GLUCOSE, CAPILLARY
Glucose-Capillary: 308 mg/dL — ABNORMAL HIGH (ref 70–99)
Glucose-Capillary: 234 mg/dL — ABNORMAL HIGH (ref 70–99)
Glucose-Capillary: 262 mg/dL — ABNORMAL HIGH (ref 70–99)
Glucose-Capillary: 242 mg/dL — ABNORMAL HIGH (ref 70–99)
Glucose-Capillary: 231 mg/dL — ABNORMAL HIGH (ref 70–99)

## 2021-05-05 LAB — COMPREHENSIVE METABOLIC PANEL
ALT: 12 U/L (ref 0–44)
AST: 16 U/L (ref 15–41)
Albumin: 4.1 g/dL (ref 3.5–5.0)
Alkaline Phosphatase: 27 U/L — ABNORMAL LOW (ref 38–126)
Anion gap: 8 (ref 5–15)
BUN: 14 mg/dL (ref 8–23)
CO2: 27 mmol/L (ref 22–32)
Calcium: 8.9 mg/dL (ref 8.9–10.3)
Chloride: 103 mmol/L (ref 98–111)
Creatinine, Ser: 0.86 mg/dL (ref 0.44–1.00)
GFR, Estimated: >60 mL/min (ref >60)
Glucose, Bld: 291 mg/dL — ABNORMAL HIGH (ref 70–99)
Potassium: 3.5 mmol/L (ref 3.5–5.1)
Sodium: 138 mmol/L (ref 135–145)
Total Bilirubin: 0.5 mg/dL (ref 0.3–1.2)
Total Protein: 6.1 g/dL — ABNORMAL LOW (ref 6.5–8.1)

## 2021-05-05 LAB — TROPONIN I (HIGH SENSITIVITY)
Troponin I (High Sensitivity): 23 ng/L — ABNORMAL HIGH (ref <18)
Troponin I (High Sensitivity): 22 ng/L — ABNORMAL HIGH (ref <18)
Troponin I (High Sensitivity): 19 ng/L — ABNORMAL HIGH (ref <18)

## 2021-05-05 LAB — PHOSPHORUS: Phosphorus: 2 mg/dL — ABNORMAL LOW (ref 2.5–4.6)

## 2021-05-05 LAB — HEMOGLOBIN A1C
Hgb A1c MFr Bld: 6.3 % — ABNORMAL HIGH (ref 4.8–5.6)
Mean Plasma Glucose: 134.11 mg/dL

## 2021-05-05 LAB — MAGNESIUM: Magnesium: 2.3 mg/dL (ref 1.7–2.4)

## 2021-05-05 LAB — HIV ANTIBODY (ROUTINE TESTING W REFLEX): HIV Screen 4th Generation wRfx: NONREACTIVE

## 2021-05-05 MED ORDER — ONDANSETRON HCL 4 MG/2ML IJ SOLN
4.0000 mg | Freq: Four times a day (QID) | INTRAMUSCULAR | Status: DC | PRN
  Administered 2021-05-07: 11:00:00 4 mg via INTRAVENOUS
  Filled 2021-05-05: qty 2

## 2021-05-05 MED ORDER — SODIUM CHLORIDE 0.9 % IV SOLN
500.0000 mg | INTRAVENOUS | Status: DC
  Administered 2021-05-05 – 2021-05-07 (×3): 500 mg via INTRAVENOUS
  Filled 2021-05-05 (×3): qty 500

## 2021-05-05 MED ORDER — FENOFIBRATE 160 MG PO TABS
160.0000 mg | ORAL_TABLET | Freq: Every day | ORAL | Status: DC
  Administered 2021-05-05 – 2021-05-07 (×3): 160 mg via ORAL
  Filled 2021-05-05 (×3): qty 1

## 2021-05-05 MED ORDER — MONTELUKAST SODIUM 10 MG PO TABS
10.0000 mg | ORAL_TABLET | Freq: Every day | ORAL | Status: DC
  Administered 2021-05-05 – 2021-05-06 (×3): 10 mg via ORAL
  Filled 2021-05-05 (×3): qty 1

## 2021-05-05 MED ORDER — INSULIN ASPART 100 UNIT/ML IJ SOLN
0.0000 [IU] | Freq: Four times a day (QID) | INTRAMUSCULAR | Status: DC
  Administered 2021-05-05: 7 [IU] via SUBCUTANEOUS
  Filled 2021-05-05: qty 1

## 2021-05-05 MED ORDER — METHYLPREDNISOLONE SODIUM SUCC 125 MG IJ SOLR
80.0000 mg | Freq: Every day | INTRAMUSCULAR | Status: AC
  Administered 2021-05-05 – 2021-05-06 (×2): 80 mg via INTRAVENOUS
  Filled 2021-05-05 (×2): qty 2

## 2021-05-05 MED ORDER — CITALOPRAM HYDROBROMIDE 20 MG PO TABS
20.0000 mg | ORAL_TABLET | Freq: Every day | ORAL | Status: DC
  Administered 2021-05-05 – 2021-05-07 (×3): 20 mg via ORAL
  Filled 2021-05-05 (×3): qty 1

## 2021-05-05 MED ORDER — INSULIN ASPART 100 UNIT/ML IJ SOLN
0.0000 [IU] | Freq: Three times a day (TID) | INTRAMUSCULAR | Status: DC
  Administered 2021-05-05 (×3): 3 [IU] via SUBCUTANEOUS
  Administered 2021-05-05: 5 [IU] via SUBCUTANEOUS
  Administered 2021-05-06 (×2): 2 [IU] via SUBCUTANEOUS
  Administered 2021-05-06: 5 [IU] via SUBCUTANEOUS
  Administered 2021-05-06: 7 [IU] via SUBCUTANEOUS
  Administered 2021-05-07: 08:00:00 2 [IU] via SUBCUTANEOUS
  Administered 2021-05-07: 13:00:00 3 [IU] via SUBCUTANEOUS
  Filled 2021-05-05 (×10): qty 1

## 2021-05-05 MED ORDER — LOSARTAN POTASSIUM 50 MG PO TABS
100.0000 mg | ORAL_TABLET | Freq: Every day | ORAL | Status: DC
  Administered 2021-05-05 – 2021-05-07 (×3): 100 mg via ORAL
  Filled 2021-05-05 (×3): qty 2

## 2021-05-05 MED ORDER — TIOTROPIUM BROMIDE MONOHYDRATE 18 MCG IN CAPS
18.0000 ug | ORAL_CAPSULE | Freq: Every day | RESPIRATORY_TRACT | Status: DC
  Administered 2021-05-05 – 2021-05-07 (×3): 18 ug via RESPIRATORY_TRACT
  Filled 2021-05-05: qty 5

## 2021-05-05 MED ORDER — ENOXAPARIN SODIUM 40 MG/0.4ML IJ SOSY
40.0000 mg | PREFILLED_SYRINGE | INTRAMUSCULAR | Status: DC
  Administered 2021-05-05 – 2021-05-07 (×3): 40 mg via SUBCUTANEOUS
  Filled 2021-05-05 (×3): qty 0.4

## 2021-05-05 MED ORDER — TRAZODONE HCL 50 MG PO TABS
150.0000 mg | ORAL_TABLET | Freq: Every day | ORAL | Status: DC
  Administered 2021-05-05 – 2021-05-06 (×3): 150 mg via ORAL
  Filled 2021-05-05: qty 1
  Filled 2021-05-05 (×2): qty 3

## 2021-05-05 MED ORDER — ATORVASTATIN CALCIUM 20 MG PO TABS
40.0000 mg | ORAL_TABLET | Freq: Every day | ORAL | Status: DC
  Administered 2021-05-05 – 2021-05-07 (×3): 40 mg via ORAL
  Filled 2021-05-05 (×3): qty 2

## 2021-05-05 MED ORDER — HYDROCODONE BIT-HOMATROP MBR 5-1.5 MG/5ML PO SOLN
5.0000 mL | Freq: Four times a day (QID) | ORAL | Status: DC | PRN
  Administered 2021-05-05 – 2021-05-06 (×3): 5 mL via ORAL
  Filled 2021-05-05 (×3): qty 5

## 2021-05-05 MED ORDER — AMLODIPINE BESYLATE 10 MG PO TABS
10.0000 mg | ORAL_TABLET | Freq: Every day | ORAL | Status: DC
  Administered 2021-05-05 – 2021-05-07 (×3): 10 mg via ORAL
  Filled 2021-05-05 (×3): qty 1

## 2021-05-05 MED ORDER — METHYLPREDNISOLONE SODIUM SUCC 125 MG IJ SOLR
80.0000 mg | Freq: Two times a day (BID) | INTRAMUSCULAR | Status: DC
  Administered 2021-05-05: 80 mg via INTRAVENOUS
  Filled 2021-05-05: qty 2

## 2021-05-05 MED ORDER — ALBUTEROL SULFATE HFA 108 (90 BASE) MCG/ACT IN AERS: 2.0000 | INHALATION_SPRAY | RESPIRATORY_TRACT | Status: DC | PRN

## 2021-05-05 MED ORDER — IOHEXOL 350 MG/ML SOLN
75.0000 mL | Freq: Once | INTRAVENOUS | Status: AC | PRN
  Administered 2021-05-05: 75 mL via INTRAVENOUS

## 2021-05-05 MED ORDER — HYDRALAZINE HCL 50 MG PO TABS
25.0000 mg | ORAL_TABLET | Freq: Two times a day (BID) | ORAL | Status: DC
  Administered 2021-05-05 – 2021-05-07 (×6): 25 mg via ORAL
  Filled 2021-05-05 (×6): qty 1

## 2021-05-05 MED ORDER — PRAMIPEXOLE DIHYDROCHLORIDE 0.25 MG PO TABS
0.5000 mg | ORAL_TABLET | Freq: Two times a day (BID) | ORAL | Status: DC
  Administered 2021-05-05 – 2021-05-07 (×6): 0.5 mg via ORAL
  Filled 2021-05-05 (×7): qty 2

## 2021-05-05 MED ORDER — ALBUTEROL SULFATE (2.5 MG/3ML) 0.083% IN NEBU
2.5000 mg | INHALATION_SOLUTION | RESPIRATORY_TRACT | Status: DC | PRN
  Administered 2021-05-06: 18:00:00 2.5 mg via RESPIRATORY_TRACT
  Filled 2021-05-05: qty 3

## 2021-05-05 MED ORDER — PANTOPRAZOLE SODIUM 40 MG PO TBEC
40.0000 mg | DELAYED_RELEASE_TABLET | Freq: Two times a day (BID) | ORAL | Status: DC
  Administered 2021-05-05 – 2021-05-07 (×6): 40 mg via ORAL
  Filled 2021-05-05 (×6): qty 1

## 2021-05-05 MED ORDER — ALBUTEROL SULFATE (2.5 MG/3ML) 0.083% IN NEBU
2.5000 mg | INHALATION_SOLUTION | Freq: Four times a day (QID) | RESPIRATORY_TRACT | Status: DC
  Administered 2021-05-05 – 2021-05-07 (×10): 2.5 mg via RESPIRATORY_TRACT
  Filled 2021-05-05 (×11): qty 3

## 2021-05-05 MED ORDER — ACETAMINOPHEN 325 MG PO TABS
650.0000 mg | ORAL_TABLET | Freq: Four times a day (QID) | ORAL | Status: DC | PRN
  Administered 2021-05-05: 08:00:00 650 mg via ORAL
  Filled 2021-05-05: qty 2

## 2021-05-05 MED ORDER — GUAIFENESIN ER 600 MG PO TB12
600.0000 mg | ORAL_TABLET | Freq: Two times a day (BID) | ORAL | Status: DC
  Administered 2021-05-05 – 2021-05-07 (×5): 600 mg via ORAL
  Filled 2021-05-05 (×5): qty 1

## 2021-05-05 MED ORDER — CLONAZEPAM 0.5 MG PO TABS
1.0000 mg | ORAL_TABLET | Freq: Every morning | ORAL | Status: DC
  Administered 2021-05-05 – 2021-05-07 (×3): 1 mg via ORAL
  Filled 2021-05-05 (×3): qty 2

## 2021-05-05 MED ORDER — FLUTICASONE FUROATE-VILANTEROL 200-25 MCG/ACT IN AEPB
1.0000 | INHALATION_SPRAY | Freq: Every day | RESPIRATORY_TRACT | Status: DC
  Administered 2021-05-05 – 2021-05-07 (×3): 1 via RESPIRATORY_TRACT
  Filled 2021-05-05: qty 28

## 2021-05-05 MED ORDER — LEVOTHYROXINE SODIUM 112 MCG PO TABS
112.0000 ug | ORAL_TABLET | Freq: Every day | ORAL | Status: DC
  Administered 2021-05-05 – 2021-05-07 (×3): 112 ug via ORAL
  Filled 2021-05-05 (×3): qty 1

## 2021-05-05 MED ORDER — CHLORHEXIDINE GLUCONATE CLOTH 2 % EX PADS
6.0000 | MEDICATED_PAD | Freq: Every day | CUTANEOUS | Status: DC
  Administered 2021-05-05 – 2021-05-07 (×3): 6 via TOPICAL

## 2021-05-05 MED ORDER — SODIUM CHLORIDE 0.9 % IV SOLN
1.0000 g | INTRAVENOUS | Status: DC
  Administered 2021-05-05 – 2021-05-07 (×3): 1 g via INTRAVENOUS
  Filled 2021-05-05 (×3): qty 1

## 2021-05-05 NOTE — ED Notes (Signed)
Pt requesting to be taken off Cpap. and placed on 02 via [place] like she wears at home. Provider made aware and permission received to trial pt off Cpap for 10 mins. Pt placed on 4L via [place] with continuous cardiac/02 monitoring in place.

## 2021-05-05 NOTE — Evaluation (Addendum)
Physical Therapy Evaluation Patient Details Name: Kristin [name] MRN: [id number] DOB: Feb 21, 1946 Today's Date: 05/05/2021  History of Present Illness  Kristin [name] is a 76 y.o. female with medical history significant of COPD, DMII, hypertension, GERD, hypothyroidism, major depression, carotid stenosis,  presenting to [place] for shortness of breath.  Patient brought by EMS for respiratory distress and on BiPAP. Admitted for acute on chronic hypoxic respiratory failure secondary to COPD exacerbation.   Clinical Impression  Patient is sitting up in chair at start of session with 4L/min O2 via nasal cannula (used throughout session). She was alert and oriented x 4 and agreeable to PT eval. Patient lives with her husband in a single story home (except one step to dining room) with threshold to enter. Previously her husband helped her with dressing, bathing and IADLs. He has Parkinson's and COPD and was recently released from the hospital (same day she was admitted). She states he is at home on oxygen mostly laying in bed waiting for PT to come. She states prior to hospitalization she used a rollator or walls/furnature for support when ambulating and has not been out of the house in 3 months. She states she has difficulty getting to the bathroom in time, especially at night, but endorses only one fall in the last 6 months when she slid off of a stool in the kitchen. She feels they need help cleaning her home but cannot afford it. She uses 4L/min O2 at all times at baseline. Upon PT eval, patient was mod I for sit to supine, supervision with cuing for log roll for supine to sit, CGA for transfers and ambulation ~ 60 feet with RW. She fatigued quickly and her SpO2 dropped to 89% with distressed breathing pattern. However, she was able to calm herself and use PLB to regulate her breathing and return her SpO2 to 94% with minimal cuing. Patient appears to have experienced a significant decline in functional  mobility and would benefit from short term rehab prior to returning home. She currently requires significant assistance for ADLs and mobility that her husband is likely not in sufficient health to provide. Patient will be monitored for improvements in function as her medical condition improves and discharge recommendation updated as appropriate. Patient would benefit from skilled physical therapy to address impairments and functional limitations (see PT Problem List below) to work towards stated goals and return to PLOF or maximal functional independence.       Recommendations for follow up therapy are one component of a multi-disciplinary discharge planning process, led by the attending physician.  Recommendations may be updated based on patient status, additional functional criteria and insurance authorization.  Follow Up Recommendations Skilled nursing-short term rehab (<3 hours/day)    Assistance Recommended at Discharge Frequent or constant Supervision/Assistance  Patient can return home with the following  A little help with walking and/or transfers;Assistance with cooking/housework;A lot of help with bathing/dressing/bathroom;Assist for transportation;Help with stairs or ramp for entrance    Equipment Recommendations Rolling walker (2 wheels)  Recommendations for Other Services       Functional Status Assessment Patient has had a recent decline in their functional status and demonstrates the ability to make significant improvements in function in a reasonable and predictable amount of time.     Precautions / Restrictions Precautions Precautions: Fall Restrictions Weight Bearing Restrictions: No      Mobility  Bed Mobility Overal bed mobility: Needs Assistance Bed Mobility: Supine to Sit, Sit to Supine  Supine to sit: Supervision Sit to supine: Modified independent (Device/Increase time)   General bed mobility comments: Flat bed no rails: Patient completed sit to supine  with increased time/effort. She was unable to come supine to sit until PT cued her for log roll technique and then she was able with extended effort/time.    Transfers Overall transfer level: Needs assistance Equipment used: Rolling walker (2 wheels) Transfers: Sit to/from Stand Sit to Stand: Min guard, From elevated surface           General transfer comment: Patient completed sit <> stand transfer from/to bed/chair with CGA and RW. She needed increased time and demonstrated increased effort, especially with lower surface. She forgot to reach back before moving stand to sit but positioned R well. SpO2 to 89% on 4L/min O2 after standing up. improved to 94% with rest and PLB.    Ambulation/Gait Ambulation/Gait assistance: Min guard Gait Distance (Feet): 60 Feet Assistive device: Rolling walker (2 wheels) Gait Pattern/deviations: Decreased stride length, Trunk flexed Gait velocity: very slow     General Gait Details: Patient ambulated ~ 60 with slow careful gait using RW and CGA. She needed cuing for turning and had difficulty calming her breath by the time she returned to her room. With minimal cuing she used PLB to improve SpO2 to 94% which had dropped to 89% during ambulation while on 4L/min O2.  Stairs            Wheelchair Mobility    Modified Rankin (Stroke Patients Only)       Balance Overall balance assessment: Needs assistance Sitting-balance support: Feet supported Sitting balance-Leahy Scale: Good     Standing balance support: Reliant on assistive device for balance, During functional activity Standing balance-Leahy Scale: Fair Standing balance comment: Patient is reliant on BUE support on RW for ambulation.                             Pertinent Vitals/Pain Pain Assessment Pain Assessment: No/denies pain    Home Living Family/patient expects to be discharged to:: Private residence Living Arrangements: Spouse/significant other (husband who  has parkinson's and was recently released from the hospital.) Available Help at Discharge: Family ([place] lives 2 miles away and they can call him. Husband is there 24/7 except for grocery runs and dr appt. Husband recently got home from hospital (saturday) and is waiting for HHPT to come. He is mostly laying in the bed on oxygen (per pt report).) Type of Home: House Home Access: Stairs to enter Entrance Stairs-Rails: None Entrance Stairs-Number of Steps: 1 (threshold)   Home Layout: One level (with step down into dining room) Home Equipment: Tub bench;BSC/3in1;Rollator (4 wheels);Cane - single point;Grab bars - tub/shower;Grab bars - toilet;Toilet riser (4/L/min O2) Additional Comments: slid off of a stool once in the last 6 months.    Prior Function               Mobility Comments: She uses her rollator for ambulation but not all the time. She uses the walls/furnature for support at times. She states she has not been out of the house for 3 months. She travels by uber to the doctor's office. She states she has difficulty getting to the bathroom in time, especially at night. ADLs Comments: prior to hospitalization she needed assistance with bathing and dressing. Her husband helps with IADLs. She feels they need help with cleaning the home.     Hand Dominance  Dominant Hand: Right    Extremity/Trunk Assessment   Upper Extremity Assessment Upper Extremity Assessment: Generalized weakness    Lower Extremity Assessment Lower Extremity Assessment: Generalized weakness    Cervical / Trunk Assessment Cervical / Trunk Assessment: Normal (Noted for involuntary head and trunk movements similar to chorea)  Communication   Communication: No difficulties  Cognition Arousal/Alertness: Awake/alert Behavior During Therapy: WFL for tasks assessed/performed Overall Cognitive Status: Within Functional Limits for tasks assessed                                           General Comments General comments (skin integrity, edema, etc.): 4L/min O2 throughout session    Exercises Other Exercises Other Exercises: Educated patient on reccomendations for discharge and role of PT in acute care setting.   Assessment/Plan    PT Assessment Patient needs continued PT services  PT Problem List Decreased strength;Cardiopulmonary status limiting activity;Decreased activity tolerance;Decreased knowledge of use of DME;Decreased balance;Decreased safety awareness;Decreased mobility       PT Treatment Interventions DME instruction;Balance training;Gait training;Neuromuscular re-education;Stair training;Functional mobility training;Therapeutic activities;Therapeutic exercise;Patient/family education    PT Goals (Current goals can be found in the Care Plan section)  Acute Rehab PT Goals Patient Stated Goal: to get help for herself and her husband at home PT Goal Formulation: With patient Time For Goal Achievement: 05/19/21 Potential to Achieve Goals: Good    Frequency Min 2X/week     Co-evaluation               AM-PAC PT "6 Clicks" Mobility  Outcome Measure Help needed turning from your back to your side while in a flat bed without using bedrails?: A Little Help needed moving from lying on your back to sitting on the side of a flat bed without using bedrails?: A Little Help needed moving to and from a bed to a chair (including a wheelchair)?: A Little Help needed standing up from a chair using your arms (e.g., wheelchair or bedside chair)?: A Little Help needed to walk in hospital room?: A Little Help needed climbing 3-5 steps with a railing? : A Lot 6 Click Score: 17    End of Session Equipment Utilized During Treatment: Gait belt;Oxygen (4L/min) Activity Tolerance: Patient limited by fatigue Patient left: in chair;with call bell/phone within reach;with chair alarm set Nurse Communication: Mobility status PT Visit Diagnosis: Unsteadiness on feet  (R26.81);Difficulty in walking, not elsewhere classified (R26.2);Muscle weakness (generalized) (M62.81)    Time: 4917-9150 PT Time Calculation (min) (ACUTE ONLY): 25 min   Charges:   PT Evaluation $PT Eval Low Complexity: 1 Low          Yovanna Cogan R. Graylon Good, PT, DPT 05/05/21, 4:14 PM

## 2021-05-05 NOTE — Progress Notes (Signed)
Cross Cover Patient is on her home oxygen settings without respiratory distress. Prior VBG without evidence of respiratory failure.transfer to [employer]

## 2021-05-05 NOTE — ED Notes (Signed)
Report called to Candace in ICU. All questions answered. Will take pt to CT before moving to ICU

## 2021-05-05 NOTE — Progress Notes (Signed)
*  PRELIMINARY RESULTS* Echocardiogram 2D Echocardiogram has been performed.  Kristin [name] 05/05/2021, 12:11 PM

## 2021-05-05 NOTE — Progress Notes (Signed)
PHARMACIST - PHYSICIAN COMMUNICATION   CONCERNING: Methylprednisolone IV    Current order: Methylprednisolone IV 40 mg BID     DESCRIPTION: Per [place] Protocol:   IV methylprednisolone will be converted to either a q12h or q24h frequency with the same total daily dose (TDD).  Ordered Dose: 1 to 125 mg TDD; convert to: TDD q24h.  Ordered Dose: 126 to 250 mg TDD; convert to: TDD div q12h.  Ordered Dose: >250 mg TDD; DAW.  Order has been adjusted to: Methylprednisolone IV 80 mg daily   Benita Gutter 05/05/2021 9:24 AM

## 2021-05-05 NOTE — Progress Notes (Signed)
[facility] Progress Note  Patient: Kristin [name]    WUJ:[id number]  DOA: 05/04/2021     Date of Service: the patient was seen and examined on 05/05/2021  Chief Complaint  Patient presents with   Shortness of Breath   Brief hospital course: Kristin [name] is a 76 y.o. female with medical history significant of COPD presenting to [place] for shortness of breath.  Patient brought by EMS for respiratory distress and on BiPAP.  Patient was wheezing on initial evaluation and was started on BiPAP by EMS was given Solu-Medrol by EMS.  Patient has COPD and is on 4 L at home at baseline.  History today of worsening cough and wheezing since the past few days.   Assessment and Plan:  Acute on chronic hypoxic respiratory failure secondary to COPD exacerbation  CTA chest negative for PE, but does show some basilar scarring and atelectasis. S/p Solu-Medrol 125 mg IV, decreased to Solu-Medrol 80 mg every 12 hourly, plan is to transition to oral prednisone after improvement. Started Breo Ellipta inhaler, continue albuterol every 6 hours scheduled and DuoNeb every 6 hourly as needed, continued Spiriva inhaler Mucinex twice daily, Hycodan as needed for cough, PPI for GI prophylaxis Continue supplemental O2 inhalation Continue azithromycin for prophylaxis    Diabetes mellitus type 2:  -Currently patient n.p.o. and therefore glycemic protocol is every 6 hours unrelated to diet. -A.m. team to changes patient is able to get off of BiPAP and tolerate diet.   Hypertension: Continue losartan, hydralazine, amlodipine. We will continue monitor BP and titrate medications accordingly  GERD: Protonix 40 mg twice daily continued.     Hypothyroidism: Levothyroxine 112 mcg p.o. continued.   Major depression: Patient continued on citalopram, Klonopin and trazodone     Carotid stenosis: Patient continued on atorvastatin 40 mg.   Body mass index is 22.31 kg/m.  Interventions:       Diet:  Heart healthy/carb modified DVT Prophylaxis: Subcutaneous Lovenox   Advance goals of care discussion: Full code  Family Communication: family was NOT present at bedside, at the time of interview.  The pt provided permission to discuss medical plan with the family. Opportunity was given to ask question and all questions were answered satisfactorily.   Disposition:  Pt is from Home, admitted with resp failre, copd, still has SOB, which precludes a safe discharge. Discharge to Home, when clinically stable, may require 1-2 more days.  Subjective: No significant events overnight, patient still has shortness of breath and feels chest pressure, denies any acute sharp pain, no palpitations, no abdominal pain.  Physical Exam: General:  alert oriented to time, place, and person.  Appear in mild distress, affect appropriate Eyes: PERRLA ENT: Oral Mucosa Clear, moist  Neck: no JVD,  Cardiovascular: S1 and S2 Present, no Murmur,  Respiratory: increased respiratory effort, Bilateral Air entry equal and Decreased, mild Crackles, mild wheezes Abdomen: Bowel Sound present, Soft and no tenderness,  Skin: no rashes Extremities: no Pedal edema, no calf tenderness Neurologic: without any new focal findings Gait not checked due to patient safety concerns  Vitals:   05/05/21 0653 05/05/21 0728 05/05/21 0800 05/05/21 1128  BP: (!) 137/58   103/82  Pulse: 72   73  Resp: 16   16  Temp: 97.9 F (36.6 C)   98.6 F (37 C)  TempSrc: Oral   Oral  SpO2: 96% 94%  93%  Weight:   59 kg   Height:   5\' 4"  (1.626 m)  Intake/Output Summary (Last 24 hours) at 05/05/2021 1215 Last data filed at 05/05/2021 1046 Gross per 24 hour  Intake 1090.27 ml  Output --  Net 1090.27 ml   Filed Weights   05/05/21 0800  Weight: 59 kg    Data Reviewed: I have personally reviewed and interpreted daily labs, tele strips, imagings as discussed above. I reviewed all nursing notes, pharmacy notes, vitals, pertinent old  records I have discussed plan of care as described above with RN and patient/family.  CBC: Recent Labs  Lab 05/04/21 2202 05/05/21 0054  WBC 11.1* 8.6  NEUTROABS 7.4  --   HGB 11.3* 10.1*  HCT 35.9* 32.3*  MCV 97.3 96.7  PLT 187 174   Basic Metabolic Panel: Recent Labs  Lab 05/04/21 2202 05/05/21 0054 05/05/21 0200  NA 138 138  --   K 3.7 3.5  --   CL 101 103  --   CO2 28 27  --   GLUCOSE 239* 291*  --   BUN 14 14  --   CREATININE 0.87 0.86  --   CALCIUM 9.3 8.9  --   MG  --   --  2.3  PHOS  --   --  2.0*    Studies: CT Angio Chest Pulmonary Embolism (PE) W or WO Contrast  Result Date: 05/05/2021 CLINICAL DATA:  Shortness of breath for several days EXAM: CT ANGIOGRAPHY CHEST WITH CONTRAST TECHNIQUE: Multidetector CT imaging of the chest was performed using the standard protocol during bolus administration of intravenous contrast. Multiplanar CT image reconstructions and MIPs were obtained to evaluate the vascular anatomy. RADIATION DOSE REDUCTION: This exam was performed according to the departmental dose-optimization program which includes automated exposure control, adjustment of the mA and/or kV according to patient size and/or use of iterative reconstruction technique. CONTRAST:  32mL OMNIPAQUE IOHEXOL 350 MG/ML SOLN COMPARISON:  Chest x-ray from the previous day. FINDINGS: Cardiovascular: Atherosclerotic calcifications of the aorta and its branches are noted. No aneurysmal dilatation or dissection is noted. No cardiac enlargement is seen. Coronary calcifications are noted. The pulmonary artery shows a normal branching pattern bilaterally. No filling defects to suggest pulmonary embolism are identified. Mediastinum/Nodes: Thoracic inlet is within normal limits. No sizable hilar or mediastinal adenopathy is noted. The esophagus as visualized is within normal limits. Lungs/Pleura: Emphysematous changes are noted in the lungs bilaterally. Lungs are well aerated without focal  infiltrate. Minimal atelectatic changes are seen bilaterally. Mild left basilar scarring is noted similar to that seen on prior chest x-ray. Upper Abdomen: Fatty infiltration of the liver is seen. No other focal abnormality is noted. Musculoskeletal: Degenerative changes of the thoracic spine are noted. Postsurgical changes in the cervical spine are seen. No acute rib abnormality is noted. No compression deformity is seen. Review of the MIP images confirms the above findings. IMPRESSION: No evidence of pulmonary emboli. Mild bibasilar atelectatic changes and left basilar scarring. Fatty liver. Aortic Atherosclerosis (ICD10-I70.0) and Emphysema (ICD10-J43.9). Electronically Signed   By: Inez Catalina M.D.   On: 05/05/2021 01:47   DG Chest Portable 1 View  Result Date: 05/04/2021 CLINICAL DATA:  Shortness of breath. EXAM: PORTABLE CHEST 1 VIEW COMPARISON:  February 21, 2020 FINDINGS: Mild linear scarring and/or atelectasis is seen within the left lung base. There is no evidence of acute infiltrate, pleural effusion or pneumothorax. The heart size and mediastinal contours are within normal limits. Moderate severity calcification of the aortic arch is noted. A radiopaque fusion plate and screws are seen overlying the cervical  spine. The visualized skeletal structures are unremarkable. IMPRESSION: Mild left basilar linear scarring and/or atelectasis. Electronically Signed   By: Virgina Norfolk M.D.   On: 05/04/2021 22:15    Scheduled Meds:  albuterol  2.5 mg Nebulization Q6H   amLODipine  10 mg Oral Daily   atorvastatin  40 mg Oral Daily   citalopram  20 mg Oral Daily   clonazePAM  1 mg Oral q AM   enoxaparin (LOVENOX) injection  40 mg Subcutaneous Q24H   fenofibrate  160 mg Oral Daily   fluticasone furoate-vilanterol  1 puff Inhalation Daily   guaiFENesin  600 mg Oral BID   hydrALAZINE  25 mg Oral BID   insulin aspart  0-9 Units Subcutaneous TID AC & HS   levothyroxine  112 mcg Oral Q0600   losartan   100 mg Oral Daily   methylPREDNISolone (SOLU-MEDROL) injection  80 mg Intravenous Daily   montelukast  10 mg Oral QHS   pantoprazole  40 mg Oral BID WC   pramipexole  0.5 mg Oral BID   tiotropium  18 mcg Inhalation Daily   traZODone  150 mg Oral QHS   Continuous Infusions:  azithromycin Stopped (05/05/21 0459)   cefTRIAXone (ROCEPHIN)  IV Stopped (05/05/21 0352)   PRN Meds: acetaminophen, albuterol, HYDROcodone bit-homatropine, ondansetron (ZOFRAN) IV  Time spent: 35 minutes  Author: Val Riles. MD [facility] 05/05/2021 12:15 PM  To reach On-call, see care teams to locate the attending and reach out to them via [email or web address]. If 7PM-7AM, please contact night-coverage If you still have difficulty reaching the attending provider, please page the [facility] (Director on Call) for [facility] on amion for assistance.

## 2021-05-06 LAB — CBC
HCT: 28.9 % — ABNORMAL LOW (ref 36.0–46.0)
Hemoglobin: 9.2 g/dL — ABNORMAL LOW (ref 12.0–15.0)
MCH: 30.9 pg (ref 26.0–34.0)
MCHC: 31.8 g/dL (ref 30.0–36.0)
MCV: 97 fL (ref 80.0–100.0)
Platelets: 166 10*3/uL (ref 150–400)
RBC: 2.98 MIL/uL — ABNORMAL LOW (ref 3.87–5.11)
RDW: 13.8 % (ref 11.5–15.5)
WBC: 7.4 10*3/uL (ref 4.0–10.5)
nRBC: 0 % (ref 0.0–0.2)

## 2021-05-06 LAB — URINALYSIS, ROUTINE W REFLEX MICROSCOPIC
Bacteria, UA: NONE SEEN
Bilirubin Urine: NEGATIVE
Glucose, UA: 150 mg/dL — AB
Ketones, ur: NEGATIVE mg/dL
Nitrite: NEGATIVE
Protein, ur: NEGATIVE mg/dL
Specific Gravity, Urine: 1.008 (ref 1.005–1.030)
pH: 7 (ref 5.0–8.0)

## 2021-05-06 LAB — ECHOCARDIOGRAM COMPLETE
AR max vel: 2.16 cm2
AV Area VTI: 2.28 cm2
AV Area mean vel: 2.2 cm2
AV Mean grad: 5 mmHg
AV Peak grad: 9.4 mmHg
Ao pk vel: 1.53 m/s
Area-P 1/2: 3.26 cm2
S' Lateral: 3.12 cm

## 2021-05-06 LAB — BASIC METABOLIC PANEL
Anion gap: 5 (ref 5–15)
BUN: 20 mg/dL (ref 8–23)
CO2: 29 mmol/L (ref 22–32)
Calcium: 8.9 mg/dL (ref 8.9–10.3)
Chloride: 107 mmol/L (ref 98–111)
Creatinine, Ser: 1 mg/dL (ref 0.44–1.00)
GFR, Estimated: 59 mL/min — ABNORMAL LOW (ref >60)
Glucose, Bld: 176 mg/dL — ABNORMAL HIGH (ref 70–99)
Potassium: 4.2 mmol/L (ref 3.5–5.1)
Sodium: 141 mmol/L (ref 135–145)

## 2021-05-06 LAB — GLUCOSE, CAPILLARY
Glucose-Capillary: 156 mg/dL — ABNORMAL HIGH (ref 70–99)
Glucose-Capillary: 181 mg/dL — ABNORMAL HIGH (ref 70–99)
Glucose-Capillary: 324 mg/dL — ABNORMAL HIGH (ref 70–99)
Glucose-Capillary: 272 mg/dL — ABNORMAL HIGH (ref 70–99)

## 2021-05-06 LAB — PHOSPHORUS: Phosphorus: 3.3 mg/dL (ref 2.5–4.6)

## 2021-05-06 LAB — MAGNESIUM: Magnesium: 2.3 mg/dL (ref 1.7–2.4)

## 2021-05-06 MED ORDER — ALENDRONATE SODIUM 70 MG PO TABS
70.0000 mg | ORAL_TABLET | ORAL | Status: DC
Start: 2021-05-06 — End: 2021-05-06

## 2021-05-06 MED ORDER — PREDNISONE 20 MG PO TABS
20.0000 mg | ORAL_TABLET | Freq: Every day | ORAL | Status: DC
  Administered 2021-05-07: 08:00:00 20 mg via ORAL
  Filled 2021-05-06: qty 1

## 2021-05-06 NOTE — Plan of Care (Signed)
Pt alert and oriented x 4. Med compliant. Pt received one dose of prn hycodan. Foley remains in place for retention. Oxygen at 4L per home dose.  Problem: Education: Goal: Knowledge of General Education information will improve Description: Including pain rating scale, medication(s)/side effects and non-pharmacologic comfort measures Outcome: Progressing   Problem: Health Behavior/Discharge Planning: Goal: Ability to manage health-related needs will improve Outcome: Progressing   Problem: Clinical Measurements: Goal: Ability to maintain clinical measurements within normal limits will improve Outcome: Progressing Goal: Will remain free from infection Outcome: Progressing Goal: Diagnostic test results will improve Outcome: Progressing Goal: Respiratory complications will improve Outcome: Progressing Goal: Cardiovascular complication will be avoided Outcome: Progressing   Problem: Activity: Goal: Risk for activity intolerance will decrease Outcome: Progressing   Problem: Nutrition: Goal: Adequate nutrition will be maintained Outcome: Progressing   Problem: Coping: Goal: Level of anxiety will decrease Outcome: Progressing   Problem: Elimination: Goal: Will not experience complications related to bowel motility Outcome: Progressing Goal: Will not experience complications related to urinary retention Outcome: Progressing   Problem: Pain Managment: Goal: General experience of comfort will improve Outcome: Progressing   Problem: Safety: Goal: Ability to remain free from injury will improve Outcome: Progressing   Problem: Skin Integrity: Goal: Risk for impaired skin integrity will decrease Outcome: Progressing

## 2021-05-06 NOTE — Consult Note (Signed)

## 2021-05-06 NOTE — Evaluation (Signed)
Occupational Therapy Evaluation Patient Details Name: Kristin [name] MRN: [id number] DOB: 14-Sep-1945 Today's Date: 05/06/2021   History of Present Illness 76 y.o. female with medical history significant of COPD, DMII, hypertension, GERD, hypothyroidism, major depression, carotid stenosis,  presenting to [facility] with shortness of breath.  Patient brought by EMS for respiratory distress and on BiPAP. Admitted for acute on chronic hypoxic respiratory failure secondary to COPD exacerbation.   Clinical Impression   Pt seen for OT evaluation this date. Prior to admission, pt was living in a 1-story home with husband. Pt reports that her husband assists her with LB ADLs and IADL at baseline, but that he is currently unable to assist her 2/2 his own recent hospitalization (discharged Feb 18th and will be receiving HHPT). Pt currently presents with decreased activity tolerance and strength compared to baseline. Due to these functional impairments, pt requires SUPERVISION for bed mobility, MIN GUARD to walk to/from bathroom (55ft total, with x1 seated rest break), MIN A for toilet transfer (BSC frame over toilet), and SET-UP assist for seated grooming tasks. Of note, pt with SpO2 desat 86-87% while on 4L/min of O2 after each bout of functional mobility (see below for more information), able to increase to 90-92% within 2 mins of PLB. Given pt's current impairments and level of assistance required for ADLs/functional transfer, recommend STR at SNF prior to return home to maximize return to PLOF and minimize risk of future falls, injury, caregiver burden, and readmission.    Recommendations for follow up therapy are one component of a multi-disciplinary discharge planning process, led by the attending physician.  Recommendations may be updated based on patient status, additional functional criteria and insurance authorization.   Follow Up Recommendations  Skilled nursing-short term rehab (<3 hours/day)     Assistance Recommended at Discharge Intermittent Supervision/Assistance  Patient can return home with the following A little help with walking and/or transfers;A lot of help with bathing/dressing/bathroom    Functional Status Assessment  Patient has had a recent decline in their functional status and demonstrates the ability to make significant improvements in function in a reasonable and predictable amount of time.  Equipment Recommendations  None recommended by OT (pt has all necessary DME)       Precautions / Restrictions Precautions Precautions: Fall Precaution Comments: Check SpO2 Restrictions Weight Bearing Restrictions: No      Mobility Bed Mobility Overal bed mobility: Needs Assistance Bed Mobility: Supine to Sit     Supine to sit: Supervision     General bed mobility comments: With [facility] elevated, patient completed sit to supine requiring increased time/effort.    Transfers Overall transfer level: Needs assistance Equipment used: Rolling walker (2 wheels) Transfers: Sit to/from Stand Sit to Stand: Min guard, Min assist, From elevated surface           General transfer comment: Requires MIN GUARD from EOB, and MIN A from BSC (frame placed over toilet)      Balance Overall balance assessment: Needs assistance Sitting-balance support: No upper extremity supported, Feet supported Sitting balance-Leahy Scale: Good     Standing balance support: Bilateral upper extremity supported, During functional activity, Reliant on assistive device for balance Standing balance-Leahy Scale: Fair Standing balance comment: Patient is reliant on BUE support on RW for ambulation of short household distances                           ADL either performed or assessed with clinical judgement  ADL Overall ADL's : Needs assistance/impaired                                       General ADL Comments: SET-UP assist for seated grooming tasks, MIN A for  toilet transfers (BSC frame over toilet), and MIN GUARD for functional mobility of short household distances with RW     Vision Ability to See in Adequate Light: 0 Adequate Patient Visual Report: No change from baseline              Pertinent Vitals/Pain Pain Assessment Pain Assessment: No/denies pain     Hand Dominance Right   Extremity/Trunk Assessment Upper Extremity Assessment Upper Extremity Assessment: Generalized weakness   Lower Extremity Assessment Lower Extremity Assessment: Generalized weakness       Communication Communication Communication: No difficulties   Cognition Arousal/Alertness: Awake/alert Behavior During Therapy: WFL for tasks assessed/performed Overall Cognitive Status: Within Functional Limits for tasks assessed                                 General Comments: Pleasant and agreeable throughout     General Comments  While on 4L/min of O2, SpO2 desat 86% following walk to bathroom and toilet transfer (SpO2 increased to 90% within 2 mins of PLB). SpO2 desat 87% following walk toilet>recliner (~58ft), with SpO2 increasing to 92% within 2 mins of PLB.            Home Living Family/patient expects to be discharged to:: Private residence Living Arrangements: Spouse/significant other (husband who has parkinson's and was recently released from the hospital.) Available Help at Discharge: Family ([place] lives 2 miles away and she can call him, but he & his girlfriend are only available PRN. Husband is there 24/7 but recently returned home from hospital (Feb 18) and is unable to provide physical assistance) Type of Home: House Home Access: Stairs to enter [employer] of Steps: 1 (threshold) Entrance Stairs-Rails: None Home Layout: One level (with step down into dining room)     Bathroom Shower/Tub: Teacher, early years/pre: Standard     Home Equipment: Tub bench;BSC/3in1;Rollator (4 wheels);Cane - single  point;Grab bars - tub/shower;Grab bars - toilet;Toilet riser (4/L/min O2)   Additional Comments: slid off of a stool once in the last 6 months.      Prior Functioning/Environment               Mobility Comments: She uses her rollator for ambulation but not all the time. She uses the walls/furniture for support at times. She states she has not been out of the house for 3 months. She travels by uber to the doctor's office. She states she has difficulty getting to the bathroom in time, especially at night. ADLs Comments: prior to hospitalization she needed assistance from husband for bathing, dressing, and IADLs. She feels her and her husband need help with cleaning the home.        OT Problem List: Decreased strength;Decreased activity tolerance;Impaired balance (sitting and/or standing);Cardiopulmonary status limiting activity      OT Treatment/Interventions: Self-care/ADL training;Therapeutic exercise;Energy conservation;DME and/or AE instruction;Therapeutic activities;Patient/family education;Balance training    OT Goals(Current goals can be found in the care plan section) Acute Rehab OT Goals Patient Stated Goal: to return home with husband OT Goal Formulation: With patient Time For Goal Achievement: 05/20/21 Potential  to Achieve Goals: Good ADL Goals Pt Will Perform Grooming: with supervision;standing (while standing for at least 3 mins) Pt Will Perform Lower Body Dressing: with min guard assist;with adaptive equipment;sit to/from stand Pt Will Transfer to Toilet: with supervision;ambulating;bedside commode  OT Frequency: Min 2X/week       AM-PAC OT "6 Clicks" Daily Activity     Outcome Measure Help from another person eating meals?: None Help from another person taking care of personal grooming?: A Little Help from another person toileting, which includes using toliet, bedpan, or urinal?: A Little Help from another person bathing (including washing, rinsing, drying)?: A  Lot Help from another person to put on and taking off regular upper body clothing?: A Little Help from another person to put on and taking off regular lower body clothing?: A Lot 6 Click Score: 17   End of Session Equipment Utilized During Treatment: Rolling walker (2 wheels);Oxygen Nurse Communication: Mobility status  Activity Tolerance: Patient tolerated treatment well Patient left: in chair;with call bell/phone within reach;with chair alarm set  OT Visit Diagnosis: Muscle weakness (generalized) (M62.81);History of falling (Z91.81)                Time: 9678-9381 OT Time Calculation (min): 23 min Charges:  OT General Charges $OT Visit: 1 Visit OT Evaluation $OT Eval Moderate Complexity: 1 Mod OT Treatments $Self Care/Home Management : 8-22 mins  Fredirick Maudlin, OTR/L [place]

## 2021-05-06 NOTE — Progress Notes (Signed)
[facility] Progress Note  Patient: Kristin [name]    JKK:[id number]  DOA: 05/04/2021     Date of Service: the patient was seen and examined on 05/06/2021  Chief Complaint  Patient presents with   Shortness of Breath   Brief hospital course: Kristin [name] is a 76 y.o. female with medical history significant of COPD presenting to [place] for shortness of breath.  Patient brought by EMS for respiratory distress and on BiPAP.  Patient was wheezing on initial evaluation and was started on BiPAP by EMS was given Solu-Medrol by EMS.  Patient has COPD and is on 4 L at home at baseline.  History today of worsening cough and wheezing since the past few days.   Assessment and Plan:  Acute on chronic hypoxic respiratory failure secondary to COPD exacerbation  CTA chest negative for PE, but does show some basilar scarring and atelectasis. S/p Solu-Medrol 125 mg IV, decreased to Solu-Medrol 80 mg every 12 hourly, and started prednisone 20 mg p.o. daily for 3 additional days starting from tomorrow 2/21. Started Breo Ellipta inhaler, continue albuterol every 6 hours scheduled and DuoNeb every 6 hourly as needed, continued Spiriva inhaler Mucinex twice daily, Hycodan as needed for cough, PPI for GI prophylaxis Continue supplemental O2 inhalation Continue azithromycin for prophylaxis    Suspected UTI on admission, so patient was started on ceftriaxone UA not very impressive Follow urine culture and then de-escalate antibiotics   Urine retention, Foley catheter was inserted on 2/19, more than 500 mL urine was obtained Continue Foley catheter for 1 week Follow urology as an outpatient for voiding trial   Diabetes mellitus type 2:  Continue sliding scale insulin, diabetic diet Monitor FSBG and titrate dose of insulin accordingly    Hypertension: Continue losartan, hydralazine, amlodipine. We will continue monitor BP and titrate medications accordingly   GERD: Protonix 40 mg twice  daily continued.     Hypothyroidism: Levothyroxine 112 mcg p.o. continued.   Major depression: Patient continued on citalopram, Klonopin and trazodone     Carotid stenosis: Patient continued on atorvastatin 40 mg.   Body mass index is 22.31 kg/m.  Interventions:   PT/OT eval done, recommend SNF placement, but patient does not want to go SNF, she wants to go home with home [facility] PT.     Diet: Heart healthy/carb modified DVT Prophylaxis: Subcutaneous Lovenox   Advance goals of care discussion: Full code  Family Communication: family was NOT present at bedside, at the time of interview.  The pt provided permission to discuss medical plan with the family. Opportunity was given to ask question and all questions were answered satisfactorily.   Disposition:  Pt is from Home, admitted with resp failre, copd, still has SOB, which precludes a safe discharge. Discharge to Home, when clinically stable, may require 1-2 more days.  Subjective: No significant events overnight, patient feels improvement in the shortness of breath, stated that she had more short of breath in the morning, which improved after treatment.  Denied any chest pain or palpitations, no any other active issues at this time.   Physical Exam: General:  alert oriented to time, place, and person.  Appear in mild distress, affect appropriate Eyes: PERRLA ENT: Oral Mucosa Clear, moist  Neck: no JVD,  Cardiovascular: S1 and S2 Present, no Murmur,  Respiratory: increased respiratory effort, Bilateral Air entry equal and Decreased, mild Crackles, mild wheezes Abdomen: Bowel Sound present, Soft and no tenderness,  Skin: no rashes Extremities: no Pedal edema, no  calf tenderness Neurologic: without any new focal findings Gait not checked due to patient safety concerns  Vitals:   05/06/21 0404 05/06/21 0821 05/06/21 1003 05/06/21 1420  BP: 118/62  (!) 148/61   Pulse: 68  77   Resp: 19  20   Temp: 98.1 F (36.7 C)  98.6  F (37 C)   TempSrc: Oral     SpO2: 96% 95% 94% 94%  Weight:      Height:        Intake/Output Summary (Last 24 hours) at 05/06/2021 1500 Last data filed at 05/06/2021 1431 Gross per 24 hour  Intake 1070 ml  Output 2800 ml  Net -1730 ml   Filed Weights   05/05/21 0800  Weight: 59 kg    Data Reviewed: I have personally reviewed and interpreted daily labs, tele strips, imagings as discussed above. I reviewed all nursing notes, pharmacy notes, vitals, pertinent old records I have discussed plan of care as described above with RN and patient/family.  CBC: Recent Labs  Lab 05/04/21 2202 05/05/21 0054 05/06/21 0535  WBC 11.1* 8.6 7.4  NEUTROABS 7.4  --   --   HGB 11.3* 10.1* 9.2*  HCT 35.9* 32.3* 28.9*  MCV 97.3 96.7 97.0  PLT 187 158 621   Basic Metabolic Panel: Recent Labs  Lab 05/04/21 2202 05/05/21 0054 05/05/21 0200 05/06/21 0535  NA 138 138  --  141  K 3.7 3.5  --  4.2  CL 101 103  --  107  CO2 28 27  --  29  GLUCOSE 239* 291*  --  176*  BUN 14 14  --  20  CREATININE 0.87 0.86  --  1.00  CALCIUM 9.3 8.9  --  8.9  MG  --   --  2.3 2.3  PHOS  --   --  2.0* 3.3    Studies: No results found.  Scheduled Meds:  albuterol  2.5 mg Nebulization Q6H   amLODipine  10 mg Oral Daily   atorvastatin  40 mg Oral Daily   Chlorhexidine Gluconate Cloth  6 each Topical Daily   citalopram  20 mg Oral Daily   clonazePAM  1 mg Oral q AM   enoxaparin (LOVENOX) injection  40 mg Subcutaneous Q24H   fenofibrate  160 mg Oral Daily   fluticasone furoate-vilanterol  1 puff Inhalation Daily   guaiFENesin  600 mg Oral BID   hydrALAZINE  25 mg Oral BID   insulin aspart  0-9 Units Subcutaneous TID AC & HS   levothyroxine  112 mcg Oral Q0600   losartan  100 mg Oral Daily   montelukast  10 mg Oral QHS   pantoprazole  40 mg Oral BID WC   pramipexole  0.5 mg Oral BID   tiotropium  18 mcg Inhalation Daily   traZODone  150 mg Oral QHS   Continuous Infusions:  azithromycin  Stopped (05/06/21 1113)   cefTRIAXone (ROCEPHIN)  IV Stopped (05/06/21 0428)   PRN Meds: acetaminophen, albuterol, HYDROcodone bit-homatropine, ondansetron (ZOFRAN) IV  Time spent: 35 minutes  Author: Val Riles. MD [facility] 05/06/2021 3:00 PM  To reach On-call, see care teams to locate the attending and reach out to them via [email or web address]. If 7PM-7AM, please contact night-coverage If you still have difficulty reaching the attending provider, please page the [facility] (Director on Call) for [facility] on amion for assistance.

## 2021-05-06 NOTE — Progress Notes (Signed)
Nutrition Brief Note  Patient identified on the Malnutrition Screening Tool (MST) Report  Wt Readings from Last 15 Encounters:  05/05/21 59 kg  06/18/20 59 kg  02/21/20 59 kg  02/19/20 59 kg  07/19/19 58.1 kg  05/31/19 57.4 kg  05/27/19 56.2 kg  12/01/17 55.8 kg  10/26/17 56.7 kg  09/02/17 54.4 kg  06/24/17 54.4 kg  12/01/16 56.3 kg  05/23/16 56.2 kg  10/23/15 54.8 kg  10/13/14 56.7 kg   Kristin [name] is a 76 y.o. female with medical history significant of COPD presenting to [place] for shortness of breath.  Patient brought by EMS for respiratory distress and on BiPAP.  Patient was wheezing on initial evaluation and was started on BiPAP by EMS was given Solu-Medrol by EMS.  Patient has COPD and is on 4 L at home at baseline.  History today of worsening cough and wheezing since the past few days.  Pt admitted with COPD exacerbation.  Pt unavailable at time of visit. Attempted to speak with pt via call to hospital room phone, however, unable to reach. RD unable to obtain further nutrition-related history or complete nutrition-focused physical exam at this time.    Medications reviewed and include prednisone.   Lab Results  Component Value Date   HGBA1C 6.3 (H) 05/05/2021   PTA DM medications are 500 mg metformin BID.   Labs reviewed: CBGS: 881-103 (inpatient orders for glycemic control are 0-9 units insulin aspart TID before meals and at bedtime).    Pt currently on a heart healthy diet. RD will liberalize diet to carb modified for wider variety of meal selections.   Current diet order is heart healthy/ carb modified, patient is consuming approximately 50-100% of meals at this time. Labs and medications reviewed.   No nutrition interventions warranted at this time. If nutrition issues arise, please consult RD.   Loistine Chance, RD, LDN, [place] Registered Dietitian II Certified Diabetes Care and Education Specialist Please refer to [facility] for RD and/or RD on-call/weekend/after  hours pager

## 2021-05-06 NOTE — TOC Initial Note (Signed)
Transition of Care [facility]) - Initial/Assessment Note    Patient Details  Name: Kristin [name] MRN: [id number] Date of Birth: Feb 09, 1946  Transition [facility]) CM/SW Contact:    Pete Pelt, RN Phone Number: 05/06/2021, 10:50 AM  Clinical Narrative:     RNCM in to speak with patient.  She currently lives at home with her husband.  Her husband was in the ED here over the weekend and needs assistance at home.  However, she states that he typically assists her despite his medical needs.    Patient states her grandson lives very close to her and he is "available night or day" to assist patient and spouse with needs.  She states her grandson handles medications for patient and spouse as well.  Patient reports that she has a walker, bedside commode and shower chair at home and does not feel she requires DME at this time.  RNCM spoke to patient about SNF recommendation.  Patient refused at time of discussion, stating she would like to return to her home because she has not left her husband's side for 40 plus years and she wants to be with him. She states that her spouse needs assistance at home now. RNCM explained that SNF recommendation was so that patient can get stronger for when she does return home, and that there will be additional challenges for both patient and spouse requiring assistance since they do not have anyone living with them 24/7.  Patient states she believes they will be okay with home health and that she would like to return home.  Spoke to hospitalist, and Dr. Dwyane Dee stated he would be in to speak with patient.  Patient states she and husband have [place] to assist them at home with baths.  She states they have [employer]  Patient Assistance.  This allows them to obtain a voucher to access [place] for transportation to appointments.  In the event they are unable to take [place], their grandchildren can provide transportation for them  Patient states that if she is able to go  home with [place], her and her spouse would like to have [place].  Corene Cornea aware and will follow up.  TOC will await results of Dr. Dwyane Dee conversation with patient and follow up with necessary recommendations.          Expected Discharge Plan:  (TBD) Barriers to Discharge: Continued Medical Work up   Patient Goals and CMS Choice Patient states their goals for this hospitalization and ongoing recovery are:: To go home with my husband, we havent been away from each other in 58 years.      Expected Discharge Plan and Services Expected Discharge Plan:  (TBD)   Discharge Planning Services: CM Consult   Living arrangements for the past 2 months: Single Family Home                                      Prior Living Arrangements/Services Living arrangements for the past 2 months: Single Family Home Lives with:: Self, Spouse Patient language and need for interpreter reviewed:: Yes (no interpreter required) Do you feel safe going back to the place where you live?: Yes      Need for Family Participation in Patient Care: Yes (Comment) Care giver support system in place?: Yes (comment) Current home services: DME (walker. BSC. Tub-shower seat) Criminal Activity/Legal Involvement Pertinent to Current Situation/Hospitalization:  No - Comment as needed  Activities of Daily Living Home Assistive Devices/Equipment: Gilford Rile (specify type) ADL Screening (condition at time of admission) Patient's cognitive ability adequate to safely complete daily activities?: Yes Is the patient deaf or have difficulty hearing?: No Does the patient have difficulty seeing, even when wearing glasses/contacts?: No Does the patient have difficulty concentrating, remembering, or making decisions?: No Patient able to express need for assistance with ADLs?: Yes Does the patient have difficulty dressing or bathing?: Yes Independently performs ADLs?: No Communication: Needs assistance Does the patient  have difficulty walking or climbing stairs?: Yes Weakness of Legs: Both Weakness of Arms/Hands: None  Permission Sought/Granted Permission sought to share information with : Facility Art therapist granted to share information with : Yes, Verbal Permission Granted              Emotional Assessment Appearance:: Appears stated age Attitude/Demeanor/Rapport: Gracious, Engaged Affect (typically observed): Pleasant, Appropriate Orientation: : Oriented to Self, Oriented to Place, Oriented to  Time Alcohol / Substance Use: Not Applicable Psych Involvement: No (comment)  Admission diagnosis:  Respiratory distress [R06.03] COPD exacerbation ([place]) [J44.1] Acute on chronic respiratory failure with hypoxia ([place]) [J96.21] Patient Active Problem List   Diagnosis Date Noted   Respiratory distress 05/05/2021   COPD exacerbation ([place]) 05/05/2021   Shortness of breath 05/04/2021   Acute exacerbation of chronic obstructive pulmonary disease (COPD) ([place]) 02/22/2020   RLQ abdominal pain 07/22/2019   Acute respiratory failure due to COVID-19 ([place]) 05/30/2019   Carotid stenosis 10/26/2017   Pyelonephritis 09/02/2017   UTI (urinary tract infection) 11/30/2016   Encephalopathy acute 11/30/2016   Chronic respiratory failure with hypoxia ([place]) 11/30/2016   Weakness generalized 11/30/2016   Acute encephalopathy 11/30/2016   Acute respiratory failure ([place]) 05/23/2016   Malnutrition of moderate degree 05/23/2016   Syncope 10/23/2015   Major depression single episode, in partial remission ([place]) 09/07/2015   Hypothyroidism, unspecified 06/02/2014   Benign essential hypertension 06/02/2014   Gastroesophageal reflux disease without esophagitis 11/23/2013   Diabetes mellitus type 2, uncomplicated ([place]) 22/48/2500   PCP:  Tracie Harrier, MD Pharmacy:   [facility] [address] [place] [address] Phone: [phone] Fax:  [phone]     Social Determinants of Health (SDOH) Interventions    Readmission Risk Interventions Readmission Risk Prevention Plan 05/06/2021 06/03/2019  Transportation Screening Complete Complete  PCP or Specialist Appt within 5-7 Days Complete -  PCP or Specialist Appt within 3-5 Days - Patient refused  Home Care Screening Complete -  Medication Review (RN CM) Complete -  HRI or [place] - Complete  Medication Review (RN Care Manager) - Complete  Some recent data might be hidden

## 2021-05-07 ENCOUNTER — Telehealth: Payer: Self-pay | Admitting: Urology

## 2021-05-07 LAB — URINE CULTURE: Culture: NO GROWTH

## 2021-05-07 LAB — GLUCOSE, CAPILLARY
Glucose-Capillary: 158 mg/dL — ABNORMAL HIGH (ref 70–99)
Glucose-Capillary: 203 mg/dL — ABNORMAL HIGH (ref 70–99)

## 2021-05-07 MED ORDER — CEFDINIR 300 MG PO CAPS
300.0000 mg | ORAL_CAPSULE | Freq: Two times a day (BID) | ORAL | Status: DC
  Filled 2021-05-07: qty 1

## 2021-05-07 MED ORDER — CEFDINIR 300 MG PO CAPS
300.0000 mg | ORAL_CAPSULE | Freq: Two times a day (BID) | ORAL | 0 refills | Status: AC
Start: 2021-05-07 — End: 2021-05-12

## 2021-05-07 MED ORDER — AZITHROMYCIN 500 MG PO TABS: 250.0000 mg | ORAL_TABLET | Freq: Every day | ORAL | Status: DC

## 2021-05-07 MED ORDER — GUAIFENESIN ER 600 MG PO TB12: 600.0000 mg | ORAL_TABLET | Freq: Two times a day (BID) | ORAL | 0 refills | Status: DC

## 2021-05-07 MED ORDER — AZITHROMYCIN 250 MG PO TABS: 250.0000 mg | ORAL_TABLET | Freq: Every day | ORAL | 0 refills | Status: AC

## 2021-05-07 MED ORDER — PREDNISONE 20 MG PO TABS: 20.0000 mg | ORAL_TABLET | Freq: Every day | ORAL | 0 refills | Status: AC

## 2021-05-07 NOTE — Telephone Encounter (Signed)
LMOM informing patient of date and times of her voiding trial. She is also in Mychart so the appointments should go in there as well.

## 2021-05-07 NOTE — Discharge Instructions (Signed)
Use your inhaler and oxygen as before

## 2021-05-07 NOTE — Discharge Summary (Addendum)
Physician Discharge Summary   Patient: Kristin [name] MRN: [id number] DOB: 01-15-1946  Admit date:     05/04/2021  Discharge date: 05/07/21  Discharge Physician: Fritzi Mandes   PCP: Tracie Harrier, MD   Recommendations at discharge:    Use your oxygen 4L /min as before Use your inhalers and nebs as before F/u Dr Bernardo Heater with [facility] urology on the appointment that will be called to you.  Discharge Diagnoses:  Acute on Chronic COPD exacerbation Chronic Respiratory failure on chronic home oxygen Acute urinary retention--has foley at discharge   Hospital Course: Kristin [name] is a 76 y.o. female with medical history significant of COPD presenting to [place] for shortness of breath.  Patient brought by EMS for respiratory distress and on BiPAP.  Patient was wheezing on initial evaluation and was started on BiPAP by EMS was given Solu-Medrol by EMS.  Patient has COPD and is on 4 L at home at baseline.  History today of worsening cough and wheezing since the past few days.  Acute on chronic hypoxic respiratory failure secondary to COPD exacerbation  -- CT chest negative for PE showed some by basilar scarring and atelectasis -- patient now at baseline 4 L nasal cannula oxygen-- continue inhalers and nebulizer -- empiric antibiotic -- PRN cough medicine -- PO prednisone for four more days  urinary retention acute -- patient tells me she had urine retention in the past -- Foley catheter was placed at admission -- discussed with Dr. Bernardo Heater will see patient as outpatient for voiding trial and possible Foley removal. Patient is in agreement with plan  diabetes mellitus type II -- continue sliding scale and resume home meds -- sugars bit on the higher side secondary to prednisone  Hypertension -- continue home meds  generalized weakness and deconditioning -physical therapy recommended PT. Patient wants to be home with her husband. She does not want to stay away from him.  Will resume home health services.       Consultants: urology as outpatient Procedures performed: none Disposition: Home Diet recommendation:  Discharge Diet Orders (From admission, onward)     Start     Ordered   05/07/21 0000  Diet - low sodium heart healthy        05/07/21 1228           Cardiac and Carb modified diet  DISCHARGE MEDICATION: Allergies as of 05/07/2021       Reactions   Codeine Nausea Only   Can take along with medication for nausea   Morphine And Related Nausea Only   Can take along with medication for nausea   Prednisone Other (See Comments)   Elevated BGL into the 300's        Medication List     STOP taking these medications    Trelegy Ellipta 100-62.5-25 MCG/ACT Aepb Generic drug: Fluticasone-Umeclidin-Vilant       TAKE these medications    alendronate 70 MG tablet Commonly known as: FOSAMAX Take 70 mg by mouth once a week.   amLODipine 5 MG tablet Commonly known as: NORVASC Take 5 mg by mouth daily.   atorvastatin 40 MG tablet Commonly known as: LIPITOR Take 40 mg by mouth daily.   azithromycin 250 MG tablet Commonly known as: ZITHROMAX Take 1 tablet (250 mg total) by mouth daily for 4 days.   cefdinir 300 MG capsule Commonly known as: OMNICEF Take 1 capsule (300 mg total) by mouth every 12 (twelve) hours for 5 days.   citalopram 10  MG tablet Commonly known as: CELEXA Take 10 mg by mouth daily.   clonazePAM 1 MG tablet Commonly known as: KLONOPIN Take 1 mg by mouth in the morning.   fenofibrate 160 MG tablet Take 160 mg by mouth daily.   guaiFENesin 600 MG 12 hr tablet Commonly known as: MUCINEX Take 1 tablet (600 mg total) by mouth 2 (two) times daily.   hydrALAZINE 50 MG tablet Commonly known as: APRESOLINE Take 0.5 tablets (25 mg total) by mouth 2 (two) times daily.   levothyroxine 112 MCG tablet Commonly known as: SYNTHROID Take 112 mcg by mouth daily before breakfast.   losartan 100 MG  tablet Commonly known as: COZAAR Take 100 mg by mouth daily.   meclizine 25 MG tablet Commonly known as: ANTIVERT Take 25 mg by mouth 3 (three) times daily as needed for dizziness.   metFORMIN 500 MG tablet Commonly known as: GLUCOPHAGE Take 500 mg by mouth 2 (two) times daily with a meal.   montelukast 10 MG tablet Commonly known as: SINGULAIR Take 10 mg by mouth at bedtime.   OXYGEN Inhale 4 L/min into the lungs continuous.   pantoprazole 40 MG tablet Commonly known as: PROTONIX Take 1 tablet by mouth 2 (two) times daily with breakfast and lunch.   Potassium Gluconate 2.5 MEQ Tabs Take 99 mg by mouth daily at 12 noon.   pramipexole 0.5 MG tablet Commonly known as: MIRAPEX Take 0.5 mg by mouth in the morning and at bedtime.   predniSONE 20 MG tablet Commonly known as: DELTASONE Take 1 tablet (20 mg total) by mouth daily with breakfast for 5 days. Start taking on: May 08, 2021   Spiriva Respimat 2.5 MCG/ACT Aers Generic drug: Tiotropium Bromide Monohydrate Inhale 2 puffs into the lungs daily at 12 noon.   traMADol 50 MG tablet Commonly known as: ULTRAM Take 50 mg by mouth daily as needed for pain.   traZODone 150 MG tablet Commonly known as: DESYREL Take 150 mg by mouth at bedtime.   Ventolin HFA 108 (90 Base) MCG/ACT inhaler Generic drug: albuterol Inhale 2 puffs into the lungs every 3 (three) hours as needed for wheezing or shortness of breath.   albuterol (2.5 MG/3ML) 0.083% nebulizer solution Commonly known as: PROVENTIL Take 2.5 mg by nebulization every 6 (six) hours.   vitamin B-12 1000 MCG tablet Commonly known as: CYANOCOBALAMIN Take 1,000 mcg by mouth daily.   Vitamin D3 25 MCG tablet Commonly known as: Vitamin D Take 1,000 Units by mouth daily.        Follow-up Information     Tracie Harrier, MD. Go to.   Specialty: Internal Medicine Why: on your appt Contact information: [address] [place]  [place] [address] [phone]                 Discharge Exam: Danley Danker Weights   05/05/21 0800  Weight: 59 kg     Condition at discharge: fair  The results of significant diagnostics from this hospitalization (including imaging, microbiology, ancillary and laboratory) are listed below for reference.   Imaging Studies: CT Angio Chest Pulmonary Embolism (PE) W or WO Contrast  Result Date: 05/05/2021 CLINICAL DATA:  Shortness of breath for several days EXAM: CT ANGIOGRAPHY CHEST WITH CONTRAST TECHNIQUE: Multidetector CT imaging of the chest was performed using the standard protocol during bolus administration of intravenous contrast. Multiplanar CT image reconstructions and MIPs were obtained to evaluate the vascular anatomy. RADIATION DOSE REDUCTION: This exam was performed according  to the departmental dose-optimization program which includes automated exposure control, adjustment of the mA and/or kV according to patient size and/or use of iterative reconstruction technique. CONTRAST:  91mL OMNIPAQUE IOHEXOL 350 MG/ML SOLN COMPARISON:  Chest x-ray from the previous day. FINDINGS: Cardiovascular: Atherosclerotic calcifications of the aorta and its branches are noted. No aneurysmal dilatation or dissection is noted. No cardiac enlargement is seen. Coronary calcifications are noted. The pulmonary artery shows a normal branching pattern bilaterally. No filling defects to suggest pulmonary embolism are identified. Mediastinum/Nodes: Thoracic inlet is within normal limits. No sizable hilar or mediastinal adenopathy is noted. The esophagus as visualized is within normal limits. Lungs/Pleura: Emphysematous changes are noted in the lungs bilaterally. Lungs are well aerated without focal infiltrate. Minimal atelectatic changes are seen bilaterally. Mild left basilar scarring is noted similar to that seen on prior chest x-ray. Upper Abdomen: Fatty infiltration of the liver is seen. No other focal abnormality is  noted. Musculoskeletal: Degenerative changes of the thoracic spine are noted. Postsurgical changes in the cervical spine are seen. No acute rib abnormality is noted. No compression deformity is seen. Review of the MIP images confirms the above findings. IMPRESSION: No evidence of pulmonary emboli. Mild bibasilar atelectatic changes and left basilar scarring. Fatty liver. Aortic Atherosclerosis (ICD10-I70.0) and Emphysema (ICD10-J43.9). Electronically Signed   By: Inez Catalina M.D.   On: 05/05/2021 01:47   DG Chest Portable 1 View  Result Date: 05/04/2021 CLINICAL DATA:  Shortness of breath. EXAM: PORTABLE CHEST 1 VIEW COMPARISON:  February 21, 2020 FINDINGS: Mild linear scarring and/or atelectasis is seen within the left lung base. There is no evidence of acute infiltrate, pleural effusion or pneumothorax. The heart size and mediastinal contours are within normal limits. Moderate severity calcification of the aortic arch is noted. A radiopaque fusion plate and screws are seen overlying the cervical spine. The visualized skeletal structures are unremarkable. IMPRESSION: Mild left basilar linear scarring and/or atelectasis. Electronically Signed   By: Virgina Norfolk M.D.   On: 05/04/2021 22:15   ECHOCARDIOGRAM COMPLETE  Result Date: 05/06/2021    ECHOCARDIOGRAM REPORT   Patient Name:   AVAYAH RAFFETY [facility] Date of Exam: 05/05/2021 Medical Rec #:  [id number]            Height:       64.0 in Accession #:    [id number]           Weight:       130.0 lb Date of Birth:  1945/11/15           BSA:          1.629 m Patient Age:    89 years             BP:           151/65 mmHg Patient Gender: F                    HR:           76 bpm. Exam Location:  [facility] Procedure: 2D Echo, Cardiac Doppler and Color Doppler Indications:     Acute respiratory distress  History:         Patient has prior history of Echocardiogram examinations.                  Stroke; Risk Factors:Diabetes and Hypertension.  Sonographer:     Alyse Low  Roar Referring Phys:  [place] Diagnosing Phys: Serafina Royals MD IMPRESSIONS  1. Left  ventricular ejection fraction, by estimation, is 60 to 65%. The left ventricle has normal function. The left ventricle has no regional wall motion abnormalities. Left ventricular diastolic parameters were normal.  2. Right ventricular systolic function is normal. The right ventricular size is normal. There is mildly elevated pulmonary artery systolic pressure.  3. The mitral valve is normal in structure. Mild mitral valve regurgitation.  4. The aortic valve is normal in structure. Aortic valve regurgitation is not visualized. FINDINGS  Left Ventricle: Left ventricular ejection fraction, by estimation, is 60 to 65%. The left ventricle has normal function. The left ventricle has no regional wall motion abnormalities. The left ventricular internal cavity size was normal in size. There is  no left ventricular hypertrophy. Left ventricular diastolic parameters were normal. Right Ventricle: The right ventricular size is normal. No increase in right ventricular wall thickness. Right ventricular systolic function is normal. There is mildly elevated pulmonary artery systolic pressure. Left Atrium: Left atrial size was normal in size. Right Atrium: Right atrial size was normal in size. Pericardium: There is no evidence of pericardial effusion. Mitral Valve: The mitral valve is normal in structure. Mild mitral valve regurgitation. Tricuspid Valve: The tricuspid valve is normal in structure. Tricuspid valve regurgitation is mild. Aortic Valve: The aortic valve is normal in structure. Aortic valve regurgitation is not visualized. Aortic valve mean gradient measures 5.0 mmHg. Aortic valve peak gradient measures 9.4 mmHg. Aortic valve area, by VTI measures 2.28 cm. Pulmonic Valve: The pulmonic valve was normal in structure. Pulmonic valve regurgitation is not visualized. Aorta: The aortic root and ascending aorta are structurally  normal, with no evidence of dilitation. IAS/Shunts: No atrial level shunt detected by color flow Doppler.  LEFT VENTRICLE PLAX 2D LVIDd:         4.60 cm   Diastology LVIDs:         3.12 cm   LV e' medial:    6.74 cm/s LV PW:         0.99 cm   LV E/e' medial:  15.0 LV IVS:        1.05 cm   LV e' lateral:   8.38 cm/s LVOT diam:     1.80 cm   LV E/e' lateral: 12.1 LV SV:         82 LV SV Index:   50 LVOT Area:     2.54 cm  RIGHT VENTRICLE RV Mid diam:    2.67 cm RV S prime:     11.50 cm/s TAPSE (M-mode): 1.9 cm LEFT ATRIUM             Index LA diam:        3.50 cm 2.15 cm/m LA Vol (A2C):   31.0 ml 19.03 ml/m LA Vol (A4C):   26.3 ml 16.14 ml/m LA Biplane Vol: 29.5 ml 18.11 ml/m  AORTIC VALVE                     PULMONIC VALVE AV Area (Vmax):    2.16 cm      PV Vmax:        1.09 m/s AV Area (Vmean):   2.20 cm      PV Peak grad:   4.8 mmHg AV Area (VTI):     2.28 cm      RVOT Peak grad: 3 mmHg AV Vmax:           153.00 cm/s AV Vmean:          104.000  cm/s AV VTI:            0.358 m AV Peak Grad:      9.4 mmHg AV Mean Grad:      5.0 mmHg LVOT Vmax:         130.00 cm/s LVOT Vmean:        90.100 cm/s LVOT VTI:          0.321 m LVOT/AV VTI ratio: 0.90  AORTA Ao Root diam: 2.60 cm Ao Asc diam:  2.90 cm MITRAL VALVE                TRICUSPID VALVE MV Area (PHT): 3.26 cm     TR Peak grad:   35.0 mmHg MV Decel Time: 233 msec     TR Vmax:        296.00 cm/s MV E velocity: 101.00 cm/s MV A velocity: 119.00 cm/s  SHUNTS MV E/A ratio:  0.85         Systemic VTI:  0.32 m MV A Prime:    13.7 cm/s    Systemic Diam: 1.80 cm Serafina Royals MD Electronically signed by Serafina Royals MD Signature Date/Time: 05/06/2021/7:38:04 AM    Final     Microbiology: Results for orders placed or performed during the hospital encounter of 05/04/21  Resp Panel by RT-PCR (Flu A&B, Covid) Nasopharyngeal Swab     Status: None   Collection Time: 05/04/21 10:08 PM   Specimen: Nasopharyngeal Swab; Nasopharyngeal(NP) swabs in vial transport medium   Result Value Ref Range Status   SARS Coronavirus 2 by RT PCR NEGATIVE NEGATIVE Final    Comment: (NOTE) SARS-CoV-2 target nucleic acids are NOT DETECTED.  The SARS-CoV-2 RNA is generally detectable in upper respiratory specimens during the acute phase of infection. The lowest concentration of SARS-CoV-2 viral copies this assay can detect is 138 copies/mL. A negative result does not preclude SARS-Cov-2 infection and should not be used as the sole basis for treatment or other patient management decisions. A negative result may occur with  improper specimen collection/handling, submission of specimen other than nasopharyngeal swab, presence of viral mutation(s) within the areas targeted by this assay, and inadequate number of viral copies(<138 copies/mL). A negative result must be combined with clinical observations, patient history, and epidemiological information. The expected result is Negative.  Fact Sheet for Patients:  EntrepreneurPulse.com.au  Fact Sheet for Healthcare Providers:  IncredibleEmployment.be  This test is no t yet approved or cleared by the [place] FDA and  has been authorized for detection and/or diagnosis of SARS-CoV-2 by FDA under an Emergency Use Authorization (EUA). This EUA will remain  in effect (meaning this test can be used) for the duration of the COVID-19 declaration under Section 564(b)(1) of the Act, 21 U.S.C.section 360bbb-3(b)(1), unless the authorization is terminated  or revoked sooner.       Influenza A by PCR NEGATIVE NEGATIVE Final   Influenza B by PCR NEGATIVE NEGATIVE Final    Comment: (NOTE) The Xpert Xpress SARS-CoV-2/FLU/RSV plus assay is intended as an aid in the diagnosis of influenza from Nasopharyngeal swab specimens and should not be used as a sole basis for treatment. Nasal washings and aspirates are unacceptable for Xpert Xpress SARS-CoV-2/FLU/RSV testing.  Fact Sheet for  Patients: EntrepreneurPulse.com.au  Fact Sheet for Healthcare Providers: IncredibleEmployment.be  This test is not yet approved or cleared by the [place] FDA and has been authorized for detection and/or diagnosis of SARS-CoV-2 by FDA under an Emergency Use Authorization (EUA). This EUA  will remain in effect (meaning this test can be used) for the duration of the COVID-19 declaration under Section 564(b)(1) of the Act, 21 U.S.C. section 360bbb-3(b)(1), unless the authorization is terminated or revoked.  Performed at [facility], [address] [place], [place] [address]   Urine Culture     Status: None   Collection Time: 05/06/21  9:50 AM   Specimen: Urine, Catheterized  Result Value Ref Range Status   Specimen Description   Final    URINE, CATHETERIZED Performed at [facility], [address] [place], [place] [address]    Special Requests   Final    NONE Performed at [facility], [address] [place], [place] [address]    Culture   Final    NO GROWTH Performed at [facility], [place] [address], [place], [place] [address]    Report Status 05/07/2021 FINAL  Final    Labs: CBC: Recent Labs  Lab 05/04/21 2202 05/05/21 0054 05/06/21 0535  WBC 11.1* 8.6 7.4  NEUTROABS 7.4  --   --   HGB 11.3* 10.1* 9.2*  HCT 35.9* 32.3* 28.9*  MCV 97.3 96.7 97.0  PLT 187 158 258   Basic Metabolic Panel: Recent Labs  Lab 05/04/21 2202 05/05/21 0054 05/05/21 0200 05/06/21 0535  NA 138 138  --  141  K 3.7 3.5  --  4.2  CL 101 103  --  107  CO2 28 27  --  29  GLUCOSE 239* 291*  --  176*  BUN 14 14  --  20  CREATININE 0.87 0.86  --  1.00  CALCIUM 9.3 8.9  --  8.9  MG  --   --  2.3 2.3  PHOS  --   --  2.0* 3.3   Liver Function Tests: Recent Labs  Lab 05/04/21 2202 05/05/21 0054  AST 15 16  ALT 12 12  ALKPHOS 30* 27*  BILITOT 0.7 0.5  PROT 6.5 6.1*  ALBUMIN 4.3 4.1   CBG: Recent Labs   Lab 05/06/21 1218 05/06/21 1622 05/06/21 2022 05/07/21 0743 05/07/21 1156  GLUCAP 181* 324* 272* 158* 203*    Discharge time spent: greater than 30 minutes.  Signed: Fritzi Mandes, MD [facility] 05/07/2021

## 2021-05-07 NOTE — Telephone Encounter (Signed)
Kristin [name] is being discharged today and we need to get her scheduled for a voiding trial with a PVR in the afternoon.

## 2021-05-08 ENCOUNTER — Telehealth: Payer: Self-pay | Admitting: Urology

## 2021-05-08 NOTE — Telephone Encounter (Signed)
Pt LMOM and is having issues with her bladder bag.

## 2021-05-15 NOTE — Progress Notes (Signed)
? ?05/16/21 ?8:39 AM  ? ?Kristin [name] ?March 22, 1945 ?[id number] ? ?Referring provider:  ?Tracie Harrier, MD ?[facility] ?[place],  [place] [address] ?Chief Complaint  ?Patient presents with  ? New Patient (Initial Visit)  ?  Hospital follow-up voiding trial  ? ? ?HPI: ?Kristin [name] is a 76 y.o.female who presents today for hospital follow-up on acute urinary retention and a voiding trial.  ? ?She has a personal history of urinary retention.   This was in the remote past following what sounds like some sort of prolapse surgery with Dr. Jacqlyn Larsen.  She recalls wearing a catheter then ultimately passing the voiding trial.  She is not having issues since. ? ?She presented and was admitted into the hospital on 05/04/2021 for shortness of breath on BiPAP given Solu-Medrol. She was found to have acute urinary retention. She was placed on a foley catheter. Creatinine was 0.87 during admission. Urine culture showed no growth on 05/06/2021. She was discharged with Foley.  ? ?She reports that before hospital she felt that she was emptying adequately. ? ?She is very anxious to have the catheter removed today.  She is had a lot of issues with discomfort and leaking. ? ?She occasionally has issues with constipation.  She does have multiple medical comorbidities including TRUS history of COPD on oxygen.  She is in a wheelchair today, accompanied by her husband. ? ? ?PMH: ?Past Medical History:  ?Diagnosis Date  ? Asthma   ? Cancer [facility]) 2010  ? Left ear cancer  ? COPD (chronic obstructive pulmonary disease) ([place])   ? COVID-19 05/27/2019  ? Diabetes mellitus without complication ([place])   ? Emphysema lung ([place])   ? Hypertension   ? Stroke [facility])   ? ? ?Surgical History: ?Past Surgical History:  ?Procedure Laterality Date  ? ABDOMINAL HYSTERECTOMY    ? BREAST BIOPSY Left 1990  ? neg  ? CHOLECYSTECTOMY    ? COLONOSCOPY WITH PROPOFOL N/A 12/01/2017  ? Procedure: COLONOSCOPY WITH PROPOFOL;  Surgeon:  Toledo, Benay Pike, MD;  Location: [facility] ENDOSCOPY;  Service: Gastroenterology;  Laterality: N/A;  ? ESOPHAGOGASTRODUODENOSCOPY N/A 12/01/2017  ? Procedure: ESOPHAGOGASTRODUODENOSCOPY (EGD);  Surgeon: Toledo, Benay Pike, MD;  Location: [facility] ENDOSCOPY;  Service: Gastroenterology;  Laterality: N/A;  ? NECK SURGERY    ? ? ?Home Medications:  ?Allergies as of 05/16/2021   ? ?   Reactions  ? Codeine Nausea Only  ? Can take along with medication for nausea  ? Morphine And Related Nausea Only  ? Can take along with medication for nausea  ? Prednisone Other (See Comments)  ? Elevated BGL into the 300's  ? ?  ? ?  ?Medication List  ?  ? ?  ? Accurate as of May 16, 2021 11:59 PM. If you have any questions, ask your nurse or doctor.  ?  ?  ? ?  ? ?alendronate 70 MG tablet ?Commonly known as: FOSAMAX ?Take 70 mg by mouth once a week. ?  ?amLODipine 5 MG tablet ?Commonly known as: NORVASC ?Take 5 mg by mouth daily. ?  ?atorvastatin 40 MG tablet ?Commonly known as: LIPITOR ?Take 40 mg by mouth daily. ?  ?citalopram 10 MG tablet ?Commonly known as: CELEXA ?Take 10 mg by mouth daily. ?  ?clonazePAM 1 MG tablet ?Commonly known as: KLONOPIN ?Take 1 mg by mouth in the morning. ?  ?fenofibrate 160 MG tablet ?Take 160 mg by mouth daily. ?  ?guaiFENesin 600 MG 12 hr tablet ?  Commonly known as: [place] ?Take 1 tablet (600 mg total) by mouth 2 (two) times daily. ?  ?hydrALAZINE 50 MG tablet ?Commonly known as: APRESOLINE ?Take 0.5 tablets (25 mg total) by mouth 2 (two) times daily. ?  ?levothyroxine 112 MCG tablet ?Commonly known as: SYNTHROID ?Take 112 mcg by mouth daily before breakfast. ?  ?losartan 100 MG tablet ?Commonly known as: COZAAR ?Take 100 mg by mouth daily. ?  ?meclizine 25 MG tablet ?Commonly known as: ANTIVERT ?Take 25 mg by mouth 3 (three) times daily as needed for dizziness. ?  ?metFORMIN 500 MG tablet ?Commonly known as: GLUCOPHAGE ?Take 500 mg by mouth 2 (two) times daily with a meal. ?  ?montelukast 10 MG tablet ?Commonly  known as: SINGULAIR ?Take 10 mg by mouth at bedtime. ?  ?OXYGEN ?Inhale 4 L/min into the lungs continuous. ?  ?pantoprazole 40 MG tablet ?Commonly known as: PROTONIX ?Take 1 tablet by mouth 2 (two) times daily with breakfast and lunch. ?  ?Potassium Gluconate 2.5 MEQ Tabs ?Take 99 mg by mouth daily at 12 noon. ?  ?pramipexole 0.5 MG tablet ?Commonly known as: MIRAPEX ?Take 0.5 mg by mouth in the morning and at bedtime. ?  ?Spiriva Respimat 2.5 MCG/ACT Aers ?Generic drug: Tiotropium Bromide Monohydrate ?Inhale 2 puffs into the lungs daily at 12 noon. ?  ?traMADol 50 MG tablet ?Commonly known as: ULTRAM ?Take 50 mg by mouth daily as needed for pain. ?  ?traZODone 150 MG tablet ?Commonly known as: DESYREL ?Take 150 mg by mouth at bedtime. ?  ?Ventolin HFA 108 (90 Base) MCG/ACT inhaler ?Generic drug: albuterol ?Inhale 2 puffs into the lungs every 3 (three) hours as needed for wheezing or shortness of breath. ?  ?albuterol (2.5 MG/3ML) 0.083% nebulizer solution ?Commonly known as: PROVENTIL ?Take 2.5 mg by nebulization every 6 (six) hours. ?  ?vitamin B-12 1000 MCG tablet ?Commonly known as: CYANOCOBALAMIN ?Take 1,000 mcg by mouth daily. ?  ?Vitamin D3 25 MCG tablet ?Commonly known as: Vitamin D ?Take 1,000 Units by mouth daily. ?  ? ?  ? ? ?Allergies:  ?Allergies  ?Allergen Reactions  ? Codeine Nausea Only  ?  Can take along with medication for nausea  ? Morphine And Related Nausea Only  ?  Can take along with medication for nausea  ? Prednisone Other (See Comments)  ?  Elevated BGL into the 300's  ? ? ?Family History: ?Family History  ?Problem Relation Age of Onset  ? Ovarian cancer Mother   ? Lung cancer Father   ? Breast cancer Paternal Aunt   ? ? ?Social History:  reports that she has quit smoking. She has been exposed to tobacco smoke. She has never used smokeless tobacco. She reports that she does not drink alcohol and does not use drugs. ? ? ?Physical Exam: ?BP (!) 148/71   Pulse 91   Ht 5\' 4"  (1.626 m)   Wt  127 lb (57.6 kg)   BMI 21.80 kg/m?   ?Constitutional:  Alert and oriented, No acute distress.  Elderly, somewhat frail appearing. ?HEENT: [place] AT, moist mucus membranes.  Trachea midline, no masses. ?Cardiovascular: No clubbing, cyanosis, or edema. ?Respiratory: Wearing O2, no distress. ?Skin: No rashes, bruises or suspicious lesions. ?Neurologic: Grossly intact, no focal deficits, moving all 4 extremities. Chronically ill on oxygen in a wheelchair ?Psychiatric: Normal mood and affect. ? ?Laboratory Data: ? ?Lab Results  ?Component Value Date  ? CREATININE 1.00 05/06/2021  ? ?Lab Results  ?Component Value Date  ?  HGBA1C 6.3 (H) 05/05/2021  ? ? ?Pertinent Imaging: ?Results for orders placed or performed in visit on 05/16/21  ?BLADDER SCAN AMB NON-IMAGING  ?Result Value Ref Range  ? Scan Result 54ml   ? ?Assessment & Plan:   ? ?Urinary retention ? - Catheter removed today  ?- Voiding trial successful  ?- PVR 44ml ?- She was given a one time dose of Keflex today. ? ? ?Return in 1 month with Zara Council, PA-C for symptom recheck and PVR  ? ?Conley Rolls as a Education administrator for Hollice Espy, MD.,have documented all relevant documentation on the behalf of Hollice Espy, MD,as directed by  Hollice Espy, MD while in the presence of Hollice Espy, MD. ? ?I have reviewed the above documentation for accuracy and completeness, and I agree with the above.  ? ?Hollice Espy, MD ? ? ?[facility] ?[address] ?[place], [place] [address] ?[phone] ?

## 2021-05-15 NOTE — Telephone Encounter (Signed)
Spoke with patient regarding leg bag, no longer leaking. Aware of location and appointment time.  ?

## 2021-05-16 ENCOUNTER — Encounter: Payer: Self-pay | Admitting: Urology

## 2021-05-16 ENCOUNTER — Ambulatory Visit (INDEPENDENT_AMBULATORY_CARE_PROVIDER_SITE_OTHER): Payer: Medicare Other | Admitting: Urology

## 2021-05-16 ENCOUNTER — Other Ambulatory Visit: Payer: Self-pay

## 2021-05-16 VITALS — BP 148/71 | HR 91 | Ht 64.0 in | Wt 127.0 lb

## 2021-05-16 DIAGNOSIS — R339 Retention of urine, unspecified: Secondary | ICD-10-CM

## 2021-05-16 LAB — BLADDER SCAN AMB NON-IMAGING

## 2021-05-16 MED ORDER — CEPHALEXIN 250 MG PO CAPS
500.0000 mg | ORAL_CAPSULE | Freq: Once | ORAL | Status: AC
  Administered 2021-05-16: 500 mg via ORAL

## 2021-05-16 NOTE — Progress Notes (Signed)
Patient ID: Kristin [name], female   DOB: June 03, 1945, 76 y.o.   MRN: [id number] ?Fill and Pull Catheter Removal ? ?Patient is present today for a catheter removal.  Patient was cleaned and prepped in a sterile fashion 294ml of sterile water/ saline was instilled into the bladder when the patient felt the urge to urinate. 50ml of water was then drained from the balloon.  A 14FR foley cath was removed from the bladder no complications were noted .  Patient as then given some time to void on their own.  Patient can void  269ml on their own after some time.  Patient tolerated well. ? ?Performed by: Edwin Dada, CMA ? ?Follow up/ Additional notes: follow-up Dr. Erlene Quan ? ?

## 2021-05-20 ENCOUNTER — Ambulatory Visit: Payer: Self-pay | Admitting: Urology

## 2021-06-18 ENCOUNTER — Ambulatory Visit: Payer: Medicare Other | Admitting: Urology

## 2021-06-28 ENCOUNTER — Other Ambulatory Visit: Payer: Self-pay

## 2021-06-28 ENCOUNTER — Emergency Department: Payer: Medicare Other

## 2021-06-28 ENCOUNTER — Emergency Department
Admission: EM | Admit: 2021-06-28 | Discharge: 2021-06-28 | Disposition: A | Discharge status: Home / Self Care | Payer: Medicare Other | Attending: Emergency Medicine | Admitting: Emergency Medicine

## 2021-06-28 DIAGNOSIS — E039 Hypothyroidism, unspecified: Secondary | ICD-10-CM | POA: Diagnosis not present

## 2021-06-28 DIAGNOSIS — R630 Anorexia: Secondary | ICD-10-CM | POA: Insufficient documentation

## 2021-06-28 DIAGNOSIS — J441 Chronic obstructive pulmonary disease with (acute) exacerbation: Secondary | ICD-10-CM | POA: Insufficient documentation

## 2021-06-28 DIAGNOSIS — I10 Essential (primary) hypertension: Secondary | ICD-10-CM | POA: Insufficient documentation

## 2021-06-28 DIAGNOSIS — R197 Diarrhea, unspecified: Secondary | ICD-10-CM | POA: Diagnosis not present

## 2021-06-28 DIAGNOSIS — R0602 Shortness of breath: Secondary | ICD-10-CM | POA: Diagnosis present

## 2021-06-28 DIAGNOSIS — E119 Type 2 diabetes mellitus without complications: Secondary | ICD-10-CM | POA: Insufficient documentation

## 2021-06-28 LAB — CBC WITH DIFFERENTIAL/PLATELET
Abs Immature Granulocytes: 0.02 10*3/uL (ref 0.00–0.07)
Basophils Absolute: 0 10*3/uL (ref 0.0–0.1)
Basophils Relative: 0 %
Eosinophils Absolute: 0.2 10*3/uL (ref 0.0–0.5)
Eosinophils Relative: 2 %
HCT: 32.2 % — ABNORMAL LOW (ref 36.0–46.0)
Hemoglobin: 10 g/dL — ABNORMAL LOW (ref 12.0–15.0)
Immature Granulocytes: 0 %
Lymphocytes Relative: 11 %
Lymphs Abs: 1 10*3/uL (ref 0.7–4.0)
MCH: 31.2 pg (ref 26.0–34.0)
MCHC: 31.1 g/dL (ref 30.0–36.0)
MCV: 100.3 fL — ABNORMAL HIGH (ref 80.0–100.0)
Monocytes Absolute: 0.6 10*3/uL (ref 0.1–1.0)
Monocytes Relative: 7 %
Neutro Abs: 7.3 10*3/uL (ref 1.7–7.7)
Neutrophils Relative %: 80 %
Platelets: 197 10*3/uL (ref 150–400)
RBC: 3.21 MIL/uL — ABNORMAL LOW (ref 3.87–5.11)
RDW: 14 % (ref 11.5–15.5)
WBC: 9.2 10*3/uL (ref 4.0–10.5)
nRBC: 0 % (ref 0.0–0.2)

## 2021-06-28 LAB — URINALYSIS, ROUTINE W REFLEX MICROSCOPIC
Bilirubin Urine: NEGATIVE
Glucose, UA: NEGATIVE mg/dL
Hgb urine dipstick: NEGATIVE
Ketones, ur: NEGATIVE mg/dL
Nitrite: NEGATIVE
Protein, ur: NEGATIVE mg/dL
Specific Gravity, Urine: 1.017 (ref 1.005–1.030)
pH: 5 (ref 5.0–8.0)

## 2021-06-28 LAB — COMPREHENSIVE METABOLIC PANEL
ALT: 9 U/L (ref 0–44)
AST: 24 U/L (ref 15–41)
Albumin: 3.8 g/dL (ref 3.5–5.0)
Alkaline Phosphatase: 17 U/L — ABNORMAL LOW (ref 38–126)
Anion gap: 7 (ref 5–15)
BUN: 18 mg/dL (ref 8–23)
CO2: 27 mmol/L (ref 22–32)
Calcium: 9.4 mg/dL (ref 8.9–10.3)
Chloride: 105 mmol/L (ref 98–111)
Creatinine, Ser: 1.18 mg/dL — ABNORMAL HIGH (ref 0.44–1.00)
GFR, Estimated: 48 mL/min — ABNORMAL LOW (ref >60)
Glucose, Bld: 136 mg/dL — ABNORMAL HIGH (ref 70–99)
Potassium: 4.4 mmol/L (ref 3.5–5.1)
Sodium: 139 mmol/L (ref 135–145)
Total Bilirubin: 1.1 mg/dL (ref 0.3–1.2)
Total Protein: 6.3 g/dL — ABNORMAL LOW (ref 6.5–8.1)

## 2021-06-28 LAB — TROPONIN I (HIGH SENSITIVITY): Troponin I (High Sensitivity): 5 ng/L (ref <18)

## 2021-06-28 MED ORDER — PREDNISONE 50 MG PO TABS: 50.0000 mg | ORAL_TABLET | Freq: Every day | ORAL | 0 refills | Status: AC

## 2021-06-28 MED ORDER — IPRATROPIUM-ALBUTEROL 0.5-2.5 (3) MG/3ML IN SOLN
3.0000 mL | Freq: Once | RESPIRATORY_TRACT | Status: AC
  Administered 2021-06-28: 3 mL via RESPIRATORY_TRACT
  Filled 2021-06-28: qty 3

## 2021-06-28 MED ORDER — METHYLPREDNISOLONE SODIUM SUCC 125 MG IJ SOLR
125.0000 mg | Freq: Once | INTRAMUSCULAR | Status: AC
  Administered 2021-06-28: 125 mg via INTRAVENOUS
  Filled 2021-06-28: qty 2

## 2021-06-28 MED ORDER — CEPHALEXIN 500 MG PO CAPS
500.0000 mg | ORAL_CAPSULE | Freq: Three times a day (TID) | ORAL | 0 refills | Status: AC
Start: 2021-06-28 — End: 2021-07-01

## 2021-06-28 MED ORDER — LACTATED RINGERS IV BOLUS
1000.0000 mL | Freq: Once | INTRAVENOUS | Status: AC
  Administered 2021-06-28: 1000 mL via INTRAVENOUS

## 2021-06-28 NOTE — ED Notes (Addendum)
Pt refusing bedpan and would like a purewick, purewick in place at this time. Pt will attempt to give [place] a urine sample. ?

## 2021-06-28 NOTE — ED Notes (Signed)
Pt gave verbal consent to DC. Pt going home on home O2 with son.  ?

## 2021-06-28 NOTE — ED Provider Notes (Signed)
? ?[facility] ?Provider Note ? ? ? Event Date/Time  ? First MD Initiated Contact with Patient 06/28/21 1457   ?  (approximate) ? ? ?History  ? ?Diarrhea ? ? ?HPI ? ?Kristin [name] is a 76 y.o. female who presents to the ED for evaluation of Diarrhea ?  ?I reviewed PCP visit from 4/12, 2 days ago.  History of hypothyroidism, anxiety, HTN, COPD on chronic 4 L, DM ? ?Patient presents to the ED from home via EMS because "everything is falling apart."  She reports feeling short of breath for a long time and feeling a little bit worse than normal.  Reports chronic progressive decline.  Reports 2 episodes of watery diarrhea this morning.  Reports poor appetite and intake, her son recently threatening her that she needs to eat something where she might die. ? ?She presents with a medication list that has both doxycycline and nitrofurantoin on it.  She reports being to prescribe doxycycline for a UTI 2 days ago.  ? ?Physical Exam  ? ?Triage Vital Signs: ?ED Triage Vitals  ?Enc Vitals Group  ?   BP 06/28/21 1454 139/62  ?   Pulse Rate 06/28/21 1454 71  ?   Resp 06/28/21 1454 (!) 22  ?   Temp 06/28/21 1454 98.8 ?F (37.1 ?C)  ?   Temp Source 06/28/21 1454 Oral  ?   SpO2 06/28/21 1450 97 %  ?   Weight 06/28/21 1454 122 lb (55.3 kg)  ?   Height 06/28/21 1454 '5\' 4"'$  (1.626 m)  ?   Head Circumference --   ?   Peak Flow --   ?   Pain Score 06/28/21 1454 0  ?   Pain Loc --   ?   Pain Edu? --   ?   Excl. in [place]? --   ? ? ?Most recent vital signs: ?Vitals:  ? 06/28/21 1730 06/28/21 1800  ?BP: (!) 139/56 138/66  ?Pulse: 73 73  ?Resp: (!) 24 19  ?Temp:    ?SpO2: 100% 99%  ? ? ?General: Awake, no distress.  Chronically ill-appearing.  Conversational. ?CV:  Good peripheral perfusion.  ?Resp:  Normal effort.  Minimal tachypnea to the low 20s.  Diffuse expiratory wheezes and slight decrease in airflow throughout.  No focal features. ?Abd:  No distention.  Suprapubic tenderness without peritoneal features.   Otherwise benign.  No CVA tenderness. ?MSK:  No deformity noted.  ?Neuro:  No focal deficits appreciated. ?Other:   ? ? ?ED Results / Procedures / Treatments  ? ?Labs ?(all labs ordered are listed, but only abnormal results are displayed) ?Labs Reviewed  ?COMPREHENSIVE METABOLIC PANEL - Abnormal; Notable for the following components:  ?    Result Value  ? Glucose, Bld 136 (*)   ? Creatinine, Ser 1.18 (*)   ? Total Protein 6.3 (*)   ? Alkaline Phosphatase 17 (*)   ? GFR, Estimated 48 (*)   ? All other components within normal limits  ?CBC WITH DIFFERENTIAL/PLATELET - Abnormal; Notable for the following components:  ? RBC 3.21 (*)   ? Hemoglobin 10.0 (*)   ? HCT 32.2 (*)   ? MCV 100.3 (*)   ? All other components within normal limits  ?URINALYSIS, ROUTINE W REFLEX MICROSCOPIC - Abnormal; Notable for the following components:  ? Color, Urine YELLOW (*)   ? APPearance CLEAR (*)   ? Leukocytes,Ua TRACE (*)   ? Bacteria, UA RARE (*)   ? All other  components within normal limits  ?TROPONIN I (HIGH SENSITIVITY)  ?TROPONIN I (HIGH SENSITIVITY)  ? ? ?EKG ?Sinus rhythm, rate of 71 bpm.  Normal axis.  Somewhat short PR and incomplete left bundle.  No evidence of acute ischemia. ? ?RADIOLOGY ?CXR reviewed by me without evidence of acute cardiopulmonary pathology.  Chronic emphysematous changes are noted ? ?Official radiology report(s): ?DG Chest Portable 1 View ? ?Result Date: 06/28/2021 ?CLINICAL DATA:  copd exacerbation EXAM: PORTABLE CHEST 1 VIEW COMPARISON:  Chest x-ray May 04, 2021. FINDINGS: Emphysema. Bibasilar subsegmental atelectasis/scar. No consolidation. No visible pleural effusions or pneumothorax. Cardiomediastinal silhouette is within normal limits. Partially imaged ACDF. IMPRESSION: 1. Bibasilar subsegmental atelectasis/scar. Otherwise, no evidence of acute cardiopulmonary disease. 2. Emphysema. Electronically Signed   By: Margaretha Sheffield M.D.   On: 06/28/2021 15:59   ? ?PROCEDURES and  INTERVENTIONS: ? ?.1-3 Lead EKG Interpretation ?Performed by: Vladimir Crofts, MD ?Authorized by: Vladimir Crofts, MD  ? ?  Interpretation: normal   ?  ECG rate:  70 ?  ECG rate assessment: normal   ?  Rhythm: sinus rhythm   ?  Ectopy: none   ?  Conduction: normal   ? ?Medications  ?lactated ringers bolus 1,000 mL (0 mLs Intravenous Stopped 06/28/21 1834)  ?ipratropium-albuterol (DUONEB) 0.5-2.5 (3) MG/3ML nebulizer solution 3 mL (3 mLs Nebulization Given 06/28/21 1603)  ?methylPREDNISolone sodium succinate (SOLU-MEDROL) 125 mg/2 mL injection 125 mg (125 mg Intravenous Given 06/28/21 1603)  ? ? ? ?IMPRESSION / MDM / ASSESSMENT AND PLAN / ED COURSE  ?I reviewed the triage vital signs and the nursing notes. ? ?76 year old female presents to the ED after 2 episodes of diarrhea this morning the setting of starting doxycycline for a UTI.  She was stigmata for COPD exacerbation and has improving symptoms with breathing treatments.  Her urine shows just a few white cells.  Considering her diarrhea we will switch from her doxycycline and discharged with a few days of Keflex.  Work-up is otherwise benign with negative troponin and no evidence of ACS.  Normal metabolic panel, CBC and CXR.  After IV fluids and treatment for her COPD, she is feeling better and I have long conversations with family who are happy to take her home.  We will discharge with 3 days of Keflex to finish antibiotics for UTI as well as a steroid burst for her lungs.  We discussed appropriate return precautions. ? ?Clinical Course as of 06/28/21 1920  ?Fri Jun 28, 2021  ?1734 Reassessed.  Feeling better. [DS]  ?[place] husband, [name], not eating well, just a few bites, for a long time. He takes care of her at home. 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 Family pushed to have her come in. We discussed pending UA, possible switching ABX. Discussed diarrhea could be caused by ABX.  He is agreeable with patient coming home later this evening as long as she is feeling better  and happy to have her back. [DS]  ?1808 Immediately after this conversation with [name], her son-in-law calls the ER and I chat with him. Mila Merry.  We talk about patient's longtime symptoms.  He is agreeable with her coming home later this evening and he will be her ride.  Assess to give him a call back when she is ready to go. [DS]  ?1919 I call Tim back [DS]  ?  ?Clinical Course User Index ?[DS] Vladimir Crofts, MD  ? ? ? ?FINAL CLINICAL IMPRESSION(S) / ED DIAGNOSES  ? ?Final diagnoses:  ?  Diarrhea, unspecified type  ?COPD exacerbation ([place])  ? ? ? ?Rx / DC Orders  ? ?ED Discharge Orders   ? ?      Ordered  ?  cephALEXin (KEFLEX) 500 MG capsule  3 times daily       ? 06/28/21 1917  ?  predniSONE (DELTASONE) 50 MG tablet  Daily       ? 06/28/21 1918  ? ?  ?  ? ?  ? ? ? ?Note:  This document was prepared using Dragon voice recognition software and may include unintentional dictation errors. ?  ?Vladimir Crofts, MD ?06/28/21 1921 ? ?

## 2021-06-28 NOTE — ED Notes (Signed)
Pt knows need for ua . 

## 2021-06-28 NOTE — ED Triage Notes (Signed)
Pt was seen at PCP for urinary symptoms 3 days ago. Pt got results of Ecoli and was started on abx. Pt began having vertigo and diarrhea this morning. Pt took immodium with no relief.  ?

## 2021-06-28 NOTE — Discharge Instructions (Addendum)
I am switching her antibiotics to Keflex to finish her antibiotic course for UTI.  Please stop the doxycycline while taking the Keflex. ? ?You may still have diarrhea for couple days from these antibiotics, please take probiotics such as eating yogurt or taking any probiotic pill. ? ?You are also being discharged with a prescription for steroids for your lungs to take for the next few days.  Please keep a close eye on your blood glucose levels while you are taking this prednisone steroid as it will likely increase your sugars. ? ?Continue all of your other chronic prescription medications and return to the ED with any further worsening symptoms. ?

## 2021-07-30 ENCOUNTER — Encounter (INDEPENDENT_AMBULATORY_CARE_PROVIDER_SITE_OTHER): Payer: Medicare Other | Admitting: Family

## 2021-07-30 ENCOUNTER — Encounter: Payer: Self-pay | Admitting: Family

## 2021-07-30 NOTE — Progress Notes (Signed)
Pt thought that she was to be seen here by an endocrinologist, explained to pt we are primary care. She already has a primary care doctor, Dr. Ginette Pitman. Larey Seat, office manager also spoke with pt. Will not be charged for this visit.  ?Pt to touch base with PCP and let him know she needs endocrinologist referral. ?

## 2021-08-10 ENCOUNTER — Inpatient Hospital Stay
Admission: EM | Admit: 2021-08-10 | Discharge: 2021-08-12 | DRG: 190 | Disposition: A | Discharge status: Home / Self Care | Payer: Medicare Other | Attending: Student in an Organized Health Care Education/Training Program | Admitting: Student in an Organized Health Care Education/Training Program

## 2021-08-10 ENCOUNTER — Emergency Department: Payer: Medicare Other

## 2021-08-10 DIAGNOSIS — E119 Type 2 diabetes mellitus without complications: Secondary | ICD-10-CM | POA: Diagnosis present

## 2021-08-10 DIAGNOSIS — Z79890 Hormone replacement therapy (postmenopausal): Secondary | ICD-10-CM | POA: Diagnosis not present

## 2021-08-10 DIAGNOSIS — J9621 Acute and chronic respiratory failure with hypoxia: Secondary | ICD-10-CM | POA: Diagnosis present

## 2021-08-10 DIAGNOSIS — J432 Centrilobular emphysema: Principal | ICD-10-CM | POA: Diagnosis present

## 2021-08-10 DIAGNOSIS — Z87440 Personal history of urinary (tract) infections: Secondary | ICD-10-CM | POA: Diagnosis not present

## 2021-08-10 DIAGNOSIS — Z90710 Acquired absence of both cervix and uterus: Secondary | ICD-10-CM | POA: Diagnosis not present

## 2021-08-10 DIAGNOSIS — Z8673 Personal history of transient ischemic attack (TIA), and cerebral infarction without residual deficits: Secondary | ICD-10-CM | POA: Diagnosis not present

## 2021-08-10 DIAGNOSIS — J441 Chronic obstructive pulmonary disease with (acute) exacerbation: Secondary | ICD-10-CM

## 2021-08-10 DIAGNOSIS — J9811 Atelectasis: Secondary | ICD-10-CM | POA: Diagnosis present

## 2021-08-10 DIAGNOSIS — Z9981 Dependence on supplemental oxygen: Secondary | ICD-10-CM | POA: Diagnosis not present

## 2021-08-10 DIAGNOSIS — J9601 Acute respiratory failure with hypoxia: Secondary | ICD-10-CM | POA: Diagnosis not present

## 2021-08-10 DIAGNOSIS — I10 Essential (primary) hypertension: Secondary | ICD-10-CM | POA: Diagnosis present

## 2021-08-10 DIAGNOSIS — F32A Depression, unspecified: Secondary | ICD-10-CM | POA: Diagnosis present

## 2021-08-10 DIAGNOSIS — Z7984 Long term (current) use of oral hypoglycemic drugs: Secondary | ICD-10-CM

## 2021-08-10 DIAGNOSIS — E039 Hypothyroidism, unspecified: Secondary | ICD-10-CM | POA: Diagnosis present

## 2021-08-10 DIAGNOSIS — Z885 Allergy status to narcotic agent status: Secondary | ICD-10-CM

## 2021-08-10 DIAGNOSIS — Z87891 Personal history of nicotine dependence: Secondary | ICD-10-CM

## 2021-08-10 DIAGNOSIS — E785 Hyperlipidemia, unspecified: Secondary | ICD-10-CM | POA: Diagnosis present

## 2021-08-10 DIAGNOSIS — Z888 Allergy status to other drugs, medicaments and biological substances status: Secondary | ICD-10-CM | POA: Diagnosis not present

## 2021-08-10 DIAGNOSIS — I248 Other forms of acute ischemic heart disease: Secondary | ICD-10-CM | POA: Diagnosis present

## 2021-08-10 DIAGNOSIS — F419 Anxiety disorder, unspecified: Secondary | ICD-10-CM | POA: Diagnosis present

## 2021-08-10 DIAGNOSIS — Z8616 Personal history of COVID-19: Secondary | ICD-10-CM

## 2021-08-10 DIAGNOSIS — Z9049 Acquired absence of other specified parts of digestive tract: Secondary | ICD-10-CM | POA: Diagnosis not present

## 2021-08-10 DIAGNOSIS — Z7983 Long term (current) use of bisphosphonates: Secondary | ICD-10-CM

## 2021-08-10 DIAGNOSIS — R748 Abnormal levels of other serum enzymes: Secondary | ICD-10-CM

## 2021-08-10 DIAGNOSIS — Z20822 Contact with and (suspected) exposure to covid-19: Secondary | ICD-10-CM | POA: Diagnosis present

## 2021-08-10 DIAGNOSIS — G2581 Restless legs syndrome: Secondary | ICD-10-CM | POA: Diagnosis present

## 2021-08-10 DIAGNOSIS — Z79899 Other long term (current) drug therapy: Secondary | ICD-10-CM

## 2021-08-10 LAB — BLOOD GAS, VENOUS
Acid-base deficit: 0.6 mmol/L (ref 0.0–2.0)
Bicarbonate: 24.9 mmol/L (ref 20.0–28.0)
Delivery systems: POSITIVE
FIO2: 30 %
O2 Saturation: 86.4 %
Patient temperature: 37
pCO2, Ven: 43 mmHg — ABNORMAL LOW (ref 44–60)
pH, Ven: 7.37 (ref 7.25–7.43)
pO2, Ven: 51 mmHg — ABNORMAL HIGH (ref 32–45)

## 2021-08-10 LAB — COMPREHENSIVE METABOLIC PANEL
ALT: 13 U/L (ref 0–44)
AST: 18 U/L (ref 15–41)
Albumin: 3.3 g/dL — ABNORMAL LOW (ref 3.5–5.0)
Alkaline Phosphatase: 79 U/L (ref 38–126)
Anion gap: 9 (ref 5–15)
BUN: 13 mg/dL (ref 8–23)
CO2: 26 mmol/L (ref 22–32)
Calcium: 8.7 mg/dL — ABNORMAL LOW (ref 8.9–10.3)
Chloride: 105 mmol/L (ref 98–111)
Creatinine, Ser: 0.8 mg/dL (ref 0.44–1.00)
GFR, Estimated: >60 mL/min (ref >60)
Glucose, Bld: 117 mg/dL — ABNORMAL HIGH (ref 70–99)
Potassium: 3.3 mmol/L — ABNORMAL LOW (ref 3.5–5.1)
Sodium: 140 mmol/L (ref 135–145)
Total Bilirubin: 0.8 mg/dL (ref 0.3–1.2)
Total Protein: 6.2 g/dL — ABNORMAL LOW (ref 6.5–8.1)

## 2021-08-10 LAB — LACTIC ACID, PLASMA
Lactic Acid, Venous: 0.9 mmol/L (ref 0.5–1.9)
Lactic Acid, Venous: 2 mmol/L (ref 0.5–1.9)

## 2021-08-10 LAB — BRAIN NATRIURETIC PEPTIDE: B Natriuretic Peptide: 178.4 pg/mL — ABNORMAL HIGH (ref 0.0–100.0)

## 2021-08-10 LAB — BLOOD GAS, ARTERIAL
Acid-base deficit: 5.4 mmol/L — ABNORMAL HIGH (ref 0.0–2.0)
Bicarbonate: 20 mmol/L (ref 20.0–28.0)
Delivery systems: POSITIVE
Expiratory PAP: 6 cmH2O
FIO2: 30 %
Inspiratory PAP: 14 cmH2O
O2 Saturation: 98.3 %
Patient temperature: 37
pCO2 arterial: 38 mmHg (ref 32–48)
pH, Arterial: 7.33 — ABNORMAL LOW (ref 7.35–7.45)
pO2, Arterial: 84 mmHg (ref 83–108)

## 2021-08-10 LAB — TROPONIN I (HIGH SENSITIVITY)
Troponin I (High Sensitivity): 27 ng/L — ABNORMAL HIGH (ref <18)
Troponin I (High Sensitivity): 25 ng/L — ABNORMAL HIGH (ref <18)
Troponin I (High Sensitivity): 27 ng/L — ABNORMAL HIGH (ref <18)

## 2021-08-10 LAB — CBG MONITORING, ED
Glucose-Capillary: 229 mg/dL — ABNORMAL HIGH (ref 70–99)
Glucose-Capillary: 139 mg/dL — ABNORMAL HIGH (ref 70–99)

## 2021-08-10 LAB — CBC WITH DIFFERENTIAL/PLATELET
Abs Immature Granulocytes: 0.05 10*3/uL (ref 0.00–0.07)
Basophils Absolute: 0.1 10*3/uL (ref 0.0–0.1)
Basophils Relative: 1 %
Eosinophils Absolute: 0 10*3/uL (ref 0.0–0.5)
Eosinophils Relative: 0 %
HCT: 32 % — ABNORMAL LOW (ref 36.0–46.0)
Hemoglobin: 10 g/dL — ABNORMAL LOW (ref 12.0–15.0)
Immature Granulocytes: 1 %
Lymphocytes Relative: 27 %
Lymphs Abs: 3 10*3/uL (ref 0.7–4.0)
MCH: 30.4 pg (ref 26.0–34.0)
MCHC: 31.3 g/dL (ref 30.0–36.0)
MCV: 97.3 fL (ref 80.0–100.0)
Monocytes Absolute: 1.1 10*3/uL — ABNORMAL HIGH (ref 0.1–1.0)
Monocytes Relative: 10 %
Neutro Abs: 6.7 10*3/uL (ref 1.7–7.7)
Neutrophils Relative %: 61 %
Platelets: 204 10*3/uL (ref 150–400)
RBC: 3.29 MIL/uL — ABNORMAL LOW (ref 3.87–5.11)
RDW: 13 % (ref 11.5–15.5)
WBC: 11 10*3/uL — ABNORMAL HIGH (ref 4.0–10.5)
nRBC: 0 % (ref 0.0–0.2)

## 2021-08-10 LAB — MAGNESIUM: Magnesium: 3.7 mg/dL — ABNORMAL HIGH (ref 1.7–2.4)

## 2021-08-10 LAB — RESP PANEL BY RT-PCR (FLU A&B, COVID) ARPGX2
Influenza A by PCR: NEGATIVE
Influenza B by PCR: NEGATIVE
SARS Coronavirus 2 by RT PCR: NEGATIVE

## 2021-08-10 LAB — D-DIMER, QUANTITATIVE: D-Dimer, Quant: 1.31 ug/mL-FEU — ABNORMAL HIGH (ref 0.00–0.50)

## 2021-08-10 LAB — TSH: TSH: 0.767 u[IU]/mL (ref 0.350–4.500)

## 2021-08-10 MED ORDER — VITAMIN B-12 1000 MCG PO TABS
1000.0000 ug | ORAL_TABLET | Freq: Every day | ORAL | Status: DC
  Administered 2021-08-11 – 2021-08-12 (×2): 1000 ug via ORAL
  Filled 2021-08-10 (×2): qty 1

## 2021-08-10 MED ORDER — CITALOPRAM HYDROBROMIDE 20 MG PO TABS
10.0000 mg | ORAL_TABLET | Freq: Every day | ORAL | Status: DC
  Administered 2021-08-10 – 2021-08-12 (×3): 10 mg via ORAL
  Filled 2021-08-10 (×3): qty 1

## 2021-08-10 MED ORDER — LEVOFLOXACIN IN D5W 750 MG/150ML IV SOLN
750.0000 mg | INTRAVENOUS | Status: DC
  Administered 2021-08-10 – 2021-08-11 (×2): 750 mg via INTRAVENOUS
  Filled 2021-08-10 (×3): qty 150

## 2021-08-10 MED ORDER — LOSARTAN POTASSIUM 50 MG PO TABS
100.0000 mg | ORAL_TABLET | Freq: Every day | ORAL | Status: DC
  Administered 2021-08-11 – 2021-08-12 (×2): 100 mg via ORAL
  Filled 2021-08-10 (×2): qty 2

## 2021-08-10 MED ORDER — MAGNESIUM SULFATE 2 GM/50ML IV SOLN
2.0000 g | Freq: Once | INTRAVENOUS | Status: AC
  Administered 2021-08-10: 2 g via INTRAVENOUS
  Filled 2021-08-10: qty 50

## 2021-08-10 MED ORDER — ONDANSETRON HCL 4 MG PO TABS: 4.0000 mg | ORAL_TABLET | Freq: Four times a day (QID) | ORAL | Status: DC | PRN

## 2021-08-10 MED ORDER — PANTOPRAZOLE SODIUM 40 MG PO TBEC
40.0000 mg | DELAYED_RELEASE_TABLET | Freq: Two times a day (BID) | ORAL | Status: DC
  Administered 2021-08-11 – 2021-08-12 (×3): 40 mg via ORAL
  Filled 2021-08-10 (×3): qty 1

## 2021-08-10 MED ORDER — ONDANSETRON HCL 4 MG/2ML IJ SOLN
4.0000 mg | Freq: Four times a day (QID) | INTRAMUSCULAR | Status: DC | PRN
Start: 2021-08-10 — End: 2021-08-12

## 2021-08-10 MED ORDER — CLONAZEPAM 0.5 MG PO TABS: 1.0000 mg | ORAL_TABLET | Freq: Every morning | ORAL | Status: DC

## 2021-08-10 MED ORDER — PREDNISONE 20 MG PO TABS: 40.0000 mg | ORAL_TABLET | Freq: Every day | ORAL | Status: DC

## 2021-08-10 MED ORDER — LEVOTHYROXINE SODIUM 112 MCG PO TABS
112.0000 ug | ORAL_TABLET | Freq: Every day | ORAL | Status: DC
  Filled 2021-08-10 (×3): qty 1

## 2021-08-10 MED ORDER — AMLODIPINE BESYLATE 5 MG PO TABS
5.0000 mg | ORAL_TABLET | Freq: Every day | ORAL | Status: DC
  Administered 2021-08-11 – 2021-08-12 (×2): 5 mg via ORAL
  Filled 2021-08-10 (×2): qty 1

## 2021-08-10 MED ORDER — ALBUTEROL SULFATE (2.5 MG/3ML) 0.083% IN NEBU: 2.5000 mg | INHALATION_SOLUTION | RESPIRATORY_TRACT | Status: DC | PRN

## 2021-08-10 MED ORDER — ENOXAPARIN SODIUM 40 MG/0.4ML IJ SOSY
40.0000 mg | PREFILLED_SYRINGE | INTRAMUSCULAR | Status: DC
  Administered 2021-08-10 – 2021-08-11 (×2): 40 mg via SUBCUTANEOUS
  Filled 2021-08-10 (×2): qty 0.4

## 2021-08-10 MED ORDER — FENOFIBRATE 160 MG PO TABS
160.0000 mg | ORAL_TABLET | Freq: Every day | ORAL | Status: DC
  Administered 2021-08-11 – 2021-08-12 (×2): 160 mg via ORAL
  Filled 2021-08-10 (×3): qty 1

## 2021-08-10 MED ORDER — HYDRALAZINE HCL 25 MG PO TABS
25.0000 mg | ORAL_TABLET | Freq: Two times a day (BID) | ORAL | Status: DC
  Administered 2021-08-11: 25 mg via ORAL
  Filled 2021-08-10: qty 1

## 2021-08-10 MED ORDER — METHYLPREDNISOLONE SODIUM SUCC 40 MG IJ SOLR
40.0000 mg | Freq: Two times a day (BID) | INTRAMUSCULAR | Status: DC
  Administered 2021-08-10: 40 mg via INTRAVENOUS
  Filled 2021-08-10: qty 1

## 2021-08-10 MED ORDER — ACETAMINOPHEN 325 MG PO TABS
650.0000 mg | ORAL_TABLET | Freq: Once | ORAL | Status: AC
  Administered 2021-08-10: 650 mg via ORAL
  Filled 2021-08-10: qty 2

## 2021-08-10 MED ORDER — ATORVASTATIN CALCIUM 20 MG PO TABS
40.0000 mg | ORAL_TABLET | Freq: Every day | ORAL | Status: DC
  Administered 2021-08-11 – 2021-08-12 (×2): 40 mg via ORAL
  Filled 2021-08-10 (×2): qty 2

## 2021-08-10 MED ORDER — ALBUTEROL SULFATE (2.5 MG/3ML) 0.083% IN NEBU
2.5000 mg | INHALATION_SOLUTION | Freq: Once | RESPIRATORY_TRACT | Status: AC
  Administered 2021-08-10: 2.5 mg via RESPIRATORY_TRACT
  Filled 2021-08-10: qty 3

## 2021-08-10 MED ORDER — IPRATROPIUM-ALBUTEROL 0.5-2.5 (3) MG/3ML IN SOLN
3.0000 mL | Freq: Four times a day (QID) | RESPIRATORY_TRACT | Status: DC
  Administered 2021-08-10 – 2021-08-11 (×5): 3 mL via RESPIRATORY_TRACT
  Filled 2021-08-10 (×5): qty 3

## 2021-08-10 MED ORDER — FLUTICASONE FUROATE-VILANTEROL 100-25 MCG/ACT IN AEPB
1.0000 | INHALATION_SPRAY | Freq: Every day | RESPIRATORY_TRACT | Status: DC
Start: 2021-08-11 — End: 2021-08-12
  Administered 2021-08-12: 1 via RESPIRATORY_TRACT
  Filled 2021-08-10: qty 28

## 2021-08-10 MED ORDER — FENTANYL CITRATE PF 50 MCG/ML IJ SOSY
25.0000 ug | PREFILLED_SYRINGE | Freq: Once | INTRAMUSCULAR | Status: AC
  Administered 2021-08-10: 25 ug via INTRAVENOUS
  Filled 2021-08-10: qty 1

## 2021-08-10 MED ORDER — ACETAMINOPHEN 325 MG PO TABS
650.0000 mg | ORAL_TABLET | Freq: Four times a day (QID) | ORAL | Status: DC | PRN
  Administered 2021-08-11: 650 mg via ORAL
  Filled 2021-08-10: qty 2

## 2021-08-10 MED ORDER — SODIUM CHLORIDE 0.9 % IV BOLUS
500.0000 mL | Freq: Once | INTRAVENOUS | Status: AC
  Administered 2021-08-10: 500 mL via INTRAVENOUS

## 2021-08-10 MED ORDER — TIOTROPIUM BROMIDE MONOHYDRATE 18 MCG IN CAPS
1.0000 | ORAL_CAPSULE | Freq: Every day | RESPIRATORY_TRACT | Status: DC
  Filled 2021-08-10 (×2): qty 5

## 2021-08-10 MED ORDER — ACETAMINOPHEN 650 MG RE SUPP: 650.0000 mg | Freq: Four times a day (QID) | RECTAL | Status: DC | PRN

## 2021-08-10 MED ORDER — METHYLPREDNISOLONE SODIUM SUCC 125 MG IJ SOLR
120.0000 mg | Freq: Two times a day (BID) | INTRAMUSCULAR | Status: DC
  Administered 2021-08-10 – 2021-08-11 (×2): 120 mg via INTRAVENOUS
  Filled 2021-08-10 (×2): qty 2

## 2021-08-10 MED ORDER — MECLIZINE HCL 25 MG PO TABS: 25.0000 mg | ORAL_TABLET | Freq: Three times a day (TID) | ORAL | Status: DC | PRN

## 2021-08-10 MED ORDER — SODIUM CHLORIDE 0.9% FLUSH
3.0000 mL | Freq: Two times a day (BID) | INTRAVENOUS | Status: DC
  Administered 2021-08-11 – 2021-08-12 (×3): 3 mL via INTRAVENOUS

## 2021-08-10 MED ORDER — UMECLIDINIUM BROMIDE 62.5 MCG/ACT IN AEPB
1.0000 | INHALATION_SPRAY | Freq: Every day | RESPIRATORY_TRACT | Status: DC
  Administered 2021-08-12: 1 via RESPIRATORY_TRACT
  Filled 2021-08-10 (×2): qty 7

## 2021-08-10 MED ORDER — INSULIN ASPART 100 UNIT/ML IJ SOLN
0.0000 [IU] | INTRAMUSCULAR | Status: DC
  Administered 2021-08-10: 3 [IU] via SUBCUTANEOUS
  Administered 2021-08-11 (×3): 2 [IU] via SUBCUTANEOUS
  Administered 2021-08-11 (×2): 1 [IU] via SUBCUTANEOUS
  Administered 2021-08-12: 2 [IU] via SUBCUTANEOUS
  Administered 2021-08-12: 1 [IU] via SUBCUTANEOUS
  Filled 2021-08-10 (×7): qty 1

## 2021-08-10 MED ORDER — MONTELUKAST SODIUM 10 MG PO TABS
10.0000 mg | ORAL_TABLET | Freq: Every day | ORAL | Status: DC
  Administered 2021-08-11: 10 mg via ORAL
  Filled 2021-08-10: qty 1

## 2021-08-10 NOTE — H&P (Addendum)
History and Physical    Kristin [name] OXB:[id number] DOB: [birth year] DOA: 08/10/2021  PCP: Tracie Harrier, MD  Patient coming from: home  I have personally briefly reviewed patient's old medical records in [place]  Chief Complaint: sob xfew days  HPI: Kristin [name] is a 76 y.o. female with medical history significant of COPD with chronic hypoxic respiratory failure on 4L home O2, Type 2 DM, Hyperlipidemia, RLS, Hypothyroidism, Recurrent UTI's Depression and Anxiety who presents to ED BIB EMS with complaint of SOB x last few days with acute worsening. Per notes in the field patient was noted to be in severe respiratory distress and was immediately placed on nrb. She was also treated with magnesium, solumedrol, as well as dounebs prior to arrival to ED. Patient currently states she feels her breathing is better.  Difficult to obtain full ros/hisptory due to being on bipap.    ED Course:  Temp 99, bp 148/60, hr 102,  sat 98% on bipap  Labs: Wbc 11, hgb 10 at baseline, plt204 Lactic 0.9 Respiratory panel neg Cxr:nad Na 140, K 3.3,  glu 117 CE 27 bnp 178 Vbg:7.37,43 po2 51 Abg:ph7.33/38/po284 at 98 Tx fentanyl, tylenol, douneb Review of Systems: As per HPI  Past Medical History:  Diagnosis Date   Asthma    Cancer ([place]) 2010   Left ear cancer   COPD (chronic obstructive pulmonary disease) ([place])    COVID-19 05/27/2019   Diabetes mellitus without complication (HCC)    Emphysema lung ([place])    Hypertension    Stroke [facility])     Past Surgical History:  Procedure Laterality Date   ABDOMINAL HYSTERECTOMY     BREAST BIOPSY Left 1990   neg   CHOLECYSTECTOMY     COLONOSCOPY WITH PROPOFOL N/A 12/01/2017   Procedure: COLONOSCOPY WITH PROPOFOL;  Surgeon: Toledo, Benay Pike, MD;  Location: [facility] ENDOSCOPY;  Service: Gastroenterology;  Laterality: N/A;   ESOPHAGOGASTRODUODENOSCOPY N/A 12/01/2017   Procedure: ESOPHAGOGASTRODUODENOSCOPY (EGD);  Surgeon: Toledo, Benay Pike, MD;  Location: [facility] ENDOSCOPY;  Service: Gastroenterology;  Laterality: N/A;   NECK SURGERY       reports that she has quit smoking. She has been exposed to tobacco smoke. She has never used smokeless tobacco. She reports that she does not drink alcohol and does not use drugs.  Allergies  Allergen Reactions   Codeine Nausea Only    Can take along with medication for nausea   Morphine And Related Nausea Only    Can take along with medication for nausea   Prednisone Other (See Comments)    Elevated BGL into the 300's    Family History  Problem Relation Age of Onset   Ovarian cancer Mother    Lung cancer Father    Breast cancer Paternal Aunt     Prior to Admission medications   Medication Sig Start Date End Date Taking? Authorizing Provider  albuterol (PROVENTIL) (2.5 MG/3ML) 0.083% nebulizer solution Take 2.5 mg by nebulization every 6 (six) hours. 01/23/20  Yes [provider]  amLODipine (NORVASC) 5 MG tablet Take 5 mg by mouth daily. 02/01/21  Yes [provider]  atorvastatin (LIPITOR) 40 MG tablet Take 40 mg by mouth daily.    Yes [provider]  citalopram (CELEXA) 10 MG tablet Take 10 mg by mouth daily. 03/04/21  Yes [provider]  clonazePAM (KLONOPIN) 1 MG tablet Take 1 mg by mouth in the morning.   Yes [provider]  fenofibrate 160 MG tablet Take  160 mg by mouth daily.  10/06/15  Yes [provider]  hydrALAZINE (APRESOLINE) 25 MG tablet Take 25 mg by mouth in the morning and at bedtime. 08/04/21  Yes [provider]  levothyroxine (SYNTHROID, LEVOTHROID) 112 MCG tablet Take 112 mcg by mouth daily before breakfast.  09/15/15  Yes [provider]  losartan (COZAAR) 100 MG tablet Take 100 mg by mouth daily.   Yes [provider]  meclizine (ANTIVERT) 25 MG tablet Take 25 mg by mouth 3 (three) times daily as needed for dizziness. 03/29/21  Yes [provider]  metFORMIN (GLUCOPHAGE)  500 MG tablet Take 500 mg by mouth 2 (two) times daily with a meal.    Yes [provider]  montelukast (SINGULAIR) 10 MG tablet Take 10 mg by mouth at bedtime.  06/06/17  Yes [provider]  pantoprazole (PROTONIX) 40 MG tablet Take 1 tablet by mouth 2 (two) times daily with breakfast and lunch. 10/20/15  Yes [provider]  Potassium Gluconate 2.5 MEQ TABS Take 99 mg by mouth at bedtime.   Yes [provider]  pramipexole (MIRAPEX) 0.5 MG tablet Take 0.5 mg by mouth in the morning and at bedtime.   Yes [provider]  SPIRIVA RESPIMAT 2.5 MCG/ACT AERS Inhale 2 puffs into the lungs daily at 12 noon. 05/01/21  Yes [provider]  traMADol (ULTRAM) 50 MG tablet Take 50 mg by mouth daily as needed for pain. 04/27/21  Yes [provider]  traZODone (DESYREL) 150 MG tablet Take 150 mg by mouth at bedtime. 08/14/17  Yes [provider]  TRELEGY ELLIPTA 100-62.5-25 MCG/ACT AEPB Inhale 1 puff into the lungs daily. 07/15/21  Yes [provider]  VENTOLIN HFA 108 (90 Base) MCG/ACT inhaler Inhale 2 puffs into the lungs every 3 (three) hours as needed for wheezing or shortness of breath.  10/11/15  Yes [provider]  vitamin B-12 (CYANOCOBALAMIN) 1000 MCG tablet Take 1,000 mcg by mouth daily.   Yes [provider]  Vitamin D3 (VITAMIN D) 25 MCG tablet Take 1,000 Units by mouth daily.   Yes [provider]  alendronate (FOSAMAX) 70 MG tablet Take 70 mg by mouth once a week. 12/16/19   [provider]  clonazePAM (KLONOPIN) 0.5 MG tablet Take 1 mg by mouth daily as needed. Patient not taking: Reported on 08/10/2021 06/04/21   [provider]  guaiFENesin (MUCINEX) 600 MG 12 hr tablet Take 1 tablet (600 mg total) by mouth 2 (two) times daily. Patient not taking: Reported on 07/30/2021 05/07/21   Fritzi Mandes, MD  hydrALAZINE (APRESOLINE) 50 MG tablet Take 0.5 tablets (25 mg total) by mouth 2 (two)  times daily. Patient not taking: Reported on 08/10/2021 07/19/19   Enzo Bi, MD  OXYGEN Inhale 4 L/min into the lungs continuous.     [provider]    Physical Exam: Vitals:   08/10/21 1527 08/10/21 1530 08/10/21 1600 08/10/21 1630  BP:  (!) 134/56 (!) 132/54 (!) 125/45  Pulse: (!) 104 (!) 104 98 93  Resp: (!) 30 (!) 30 (!) 26 (!) 29  Temp:      TempSrc:      SpO2: 98% 98% 94% 95%  Weight:      Height:         Vitals:   08/10/21 1527 08/10/21 1530 08/10/21 1600 08/10/21 1630  BP:  (!) 134/56 (!) 132/54 (!) 125/45  Pulse: (!) 104 (!) 104 98 93  Resp: Marland Kitchen)  30 (!) 30 (!) 26 (!) 29  Temp:      TempSrc:      SpO2: 98% 98% 94% 95%  Weight:      Height:      Constitutional: NAD, calm, comfortable, appears pale and weak No sig increase wob Eyes: PERRL, lids and conjunctivae normal ENMT: Mucous membranes are moist. Posterior pharynx clear of any exudate or lesions.Normal dentition.  Neck: normal, supple, no masses, no thyromegaly Respiratory: clear to auscultation bilaterally, faint wheezing good air movement, no crackles. fair respiratory effort. Minimal accessory muscle use.  Cardiovascular: Regular rate and rhythm, no murmurs / rubs / gallops. No extremity edema. 2+ pedal pulses.  Abdomen: no tenderness, no masses palpated. No hepatosplenomegaly. Bowel sounds positive.  Musculoskeletal: no clubbing / cyanosis. No joint deformity upper and lower extremities. Good ROM, no contractures. Normal muscle tone.  Skin: no rashes, lesions, ulcers. No induration Neurologic: CN 2-12 grossly intact. Sensation intact, Strength 5/5 in all 4.  Psychiatric: Normal judgment and insight. Alert and oriented x 3. Normal mood.    Labs on Admission: I have personally reviewed following labs and imaging studies  CBC: Recent Labs  Lab 08/10/21 1520  WBC 11.0*  NEUTROABS 6.7  HGB 10.0*  HCT 32.0*  MCV 97.3  PLT 627   Basic Metabolic Panel: Recent Labs  Lab 08/10/21 1520  NA 140   K 3.3*  CL 105  CO2 26  GLUCOSE 117*  BUN 13  CREATININE 0.80  CALCIUM 8.7*   GFR: Estimated Creatinine Clearance: 50.8 mL/min (by C-G formula based on SCr of 0.8 mg/dL). Liver Function Tests: Recent Labs  Lab 08/10/21 1520  AST 18  ALT 13  ALKPHOS 79  BILITOT 0.8  PROT 6.2*  ALBUMIN 3.3*   No results for input(s): LIPASE, AMYLASE in the last 168 hours. No results for input(s): AMMONIA in the last 168 hours. Coagulation Profile: No results for input(s): INR, PROTIME in the last 168 hours. Cardiac Enzymes: No results for input(s): CKTOTAL, CKMB, CKMBINDEX, TROPONINI in the last 168 hours. BNP (last 3 results) No results for input(s): PROBNP in the last 8760 hours. HbA1C: No results for input(s): HGBA1C in the last 72 hours. CBG: No results for input(s): GLUCAP in the last 168 hours. Lipid Profile: No results for input(s): CHOL, HDL, LDLCALC, TRIG, CHOLHDL, LDLDIRECT in the last 72 hours. Thyroid Function Tests: No results for input(s): TSH, T4TOTAL, FREET4, T3FREE, THYROIDAB in the last 72 hours. Anemia Panel: No results for input(s): VITAMINB12, FOLATE, FERRITIN, TIBC, IRON, RETICCTPCT in the last 72 hours. Urine analysis:    Component Value Date/Time   COLORURINE YELLOW (A) 06/28/2021 1535   APPEARANCEUR CLEAR (A) 06/28/2021 1535   APPEARANCEUR Clear 03/18/2013 1319   LABSPEC 1.017 06/28/2021 1535   LABSPEC 1.005 03/18/2013 1319   PHURINE 5.0 06/28/2021 1535   GLUCOSEU NEGATIVE 06/28/2021 1535   GLUCOSEU Negative 03/18/2013 1319   HGBUR NEGATIVE 06/28/2021 1535   BILIRUBINUR NEGATIVE 06/28/2021 1535   BILIRUBINUR Negative 03/18/2013 1319   KETONESUR NEGATIVE 06/28/2021 1535   PROTEINUR NEGATIVE 06/28/2021 1535   UROBILINOGEN 1.0 06/24/2008 1545   NITRITE NEGATIVE 06/28/2021 1535   LEUKOCYTESUR TRACE (A) 06/28/2021 1535   LEUKOCYTESUR 1+ 03/18/2013 1319    Radiological Exams on Admission: DG Chest Port 1 View  Result Date: 08/10/2021 CLINICAL DATA:   Short of breath. EXAM: PORTABLE CHEST 1 VIEW COMPARISON:  06/28/2021 and older exams. FINDINGS: Cardiac silhouette is normal in size and configuration. Normal mediastinal and hilar contours.  Lungs are hyperexpanded but clear. No pleural effusion or pneumothorax. Skeletal structures are grossly intact. IMPRESSION: No acute cardiopulmonary disease. Electronically Signed   By: Lajean Manes M.D.   On: 08/10/2021 15:33    EKG: Independently reviewed. Sinus  , LAFB nonspecific  t wave changes  Assessment/Plan  Acute COPD exacerbation with acute on chronic hypoxic respiratory failure  -admit to progressive unit -continue with bipap for work of breathing ,wean as able  -place on copd protocol  -standing prn nebs  -solumedrol,  -resume home inhalers  -f/u on sputum sample  -to be complete check d-dimer if + f/u with CTPA      Abn CE  -due to demand ischemia related to acute respiratory failure  -to be complete will cycle ce  -further evaluation based on results   Type 2 DM -place on iss/fs  -hold oral home meds for now   Hyperlipidemia -resume home regimen   RLS -resume home regimen    Hypothyroidism -continue on synthroid   Depression and Anxiety -resume psych regimen as able   DVT prophylaxis: lwmh Code Status: full Family Communication: n/a Dispo: patient  expected to be admitted greater than 2 midnights  Consults called: n/a Admission status: inpatient   Clance Boll MD [facility]  If 7PM-7AM, please contact night-coverage [email or web address] Password [facility]  08/10/2021, 5:46 PM

## 2021-08-10 NOTE — ED Notes (Signed)
Called RT back to bedside to place pt on Bipap. Pt RR 32 and pt states she can't breathe.

## 2021-08-10 NOTE — ED Notes (Signed)
Gave pt husband and son update.

## 2021-08-10 NOTE — ED Notes (Signed)
Called RT to place pt back on bipap.

## 2021-08-10 NOTE — ED Provider Notes (Signed)
[facility] Provider Note    Event Date/Time   First MD Initiated Contact with Patient 08/10/21 1524     (approximate)   History   Respiratory Distress   HPI  Kristin [name] is a 76 y.o. female with history of COPD on 4 L home O2, diabetes, hypertension, and hypothyroidism who presents due to increased shortness of breath over at least several days.  Per EMS the patient was initially found in the tripod position in respiratory distress.  She received albuterol, DuoNeb, Solu-Medrol, and magnesium from EMS and was placed on O2 nonrebreather.  The patient reports a headache but denies chest or abdominal pain.  She unable to answer most other questions due to respiratory distress.    Physical Exam   Triage Vital Signs: ED Triage Vitals  Enc Vitals Group     BP 08/10/21 1523 (!) 148/60     Pulse Rate 08/10/21 1523 (!) 102     Resp 08/10/21 1523 17     Temp 08/10/21 1523 99 F (37.2 C)     Temp Source 08/10/21 1523 Axillary     SpO2 08/10/21 1523 98 %     Weight 08/10/21 1524 116 lb 13.5 oz (53 kg)     Height 08/10/21 1524 '5\' 4"'$  (1.626 m)     Head Circumference --      Peak Flow --      Pain Score --      Pain Loc --      Pain Edu? --      Excl. in [place]? --     Most recent vital signs: Vitals:   08/10/21 1600 08/10/21 1630  BP: (!) 132/54 (!) 125/45  Pulse: 98 93  Resp: (!) 26 (!) 29  Temp:    SpO2: 94% 95%     General: Alert, oriented, weak appearing. CV:  Good peripheral perfusion.  Normal heart sounds. Resp:  Increased respiratory effort with accessory muscle use.  Diminished breath sounds bilaterally with diffuse wheezing. Abd:  No distention.  Other:  No peripheral edema.   ED Results / Procedures / Treatments   Labs (all labs ordered are listed, but only abnormal results are displayed) Labs Reviewed  COMPREHENSIVE METABOLIC PANEL - Abnormal; Notable for the following components:      Result Value   Potassium 3.3 (*)     Glucose, Bld 117 (*)    Calcium 8.7 (*)    Total Protein 6.2 (*)    Albumin 3.3 (*)    All other components within normal limits  CBC WITH DIFFERENTIAL/PLATELET - Abnormal; Notable for the following components:   WBC 11.0 (*)    RBC 3.29 (*)    Hemoglobin 10.0 (*)    HCT 32.0 (*)    Monocytes Absolute 1.1 (*)    All other components within normal limits  BRAIN NATRIURETIC PEPTIDE - Abnormal; Notable for the following components:   B Natriuretic Peptide 178.4 (*)    All other components within normal limits  BLOOD GAS, VENOUS - Abnormal; Notable for the following components:   pCO2, Ven 43 (*)    pO2, Ven 51 (*)    All other components within normal limits  TROPONIN I (HIGH SENSITIVITY) - Abnormal; Notable for the following components:   Troponin I (High Sensitivity) 27 (*)    All other components within normal limits  RESP PANEL BY RT-PCR (FLU A&B, COVID) [id number]  LACTIC ACID, PLASMA  LACTIC ACID, PLASMA  BLOOD GAS, ARTERIAL  TROPONIN I (HIGH SENSITIVITY)     EKG  ED ECG REPORT I, Arta Silence, the attending physician, personally viewed and interpreted this ECG.  Date: 08/10/2021 EKG Time: 1522 Rate: 103 Rhythm: Sinus tachycardia QRS Axis: normal Intervals: normal ST/T Wave abnormalities: Nonspecific ST abnormalities Narrative Interpretation: Nonspecific ST abnormalities with no evidence of acute ischemia; no significant change when compared to EKG of 06/28/2021    RADIOLOGY  Chest x-ray: I independently viewed and interpreted the images; there is no focal consolidation or edema  PROCEDURES:  Critical Care performed: Yes, see critical care procedure note(s)  .Critical Care Performed by: Arta Silence, MD Authorized by: Arta Silence, MD   Critical care provider statement:    Critical care time (minutes):  30   Critical care was necessary to treat or prevent imminent or life-threatening deterioration of the following conditions:  Respiratory  failure   Critical care was time spent personally by me on the following activities:  Development of treatment plan with patient or surrogate, discussions with consultants, evaluation of patient's response to treatment, examination of patient, ordering and review of laboratory studies, ordering and review of radiographic studies, ordering and performing treatments and interventions, pulse oximetry, re-evaluation of patient's condition and review of old charts   Care discussed with: admitting provider     MEDICATIONS ORDERED IN ED: Medications  albuterol (PROVENTIL) (2.5 MG/3ML) 0.083% nebulizer solution 2.5 mg (2.5 mg Nebulization Given 08/10/21 1538)  sodium chloride 0.9 % bolus 500 mL (500 mLs Intravenous New Bag/Given 08/10/21 1539)  acetaminophen (TYLENOL) tablet 650 mg (650 mg Oral Given 08/10/21 1547)  fentaNYL (SUBLIMAZE) injection 25 mcg (25 mcg Intravenous Given 08/10/21 1547)  albuterol (PROVENTIL) (2.5 MG/3ML) 0.083% nebulizer solution 2.5 mg (2.5 mg Nebulization Given 08/10/21 1611)     IMPRESSION / MDM / [place] / ED COURSE  I reviewed the triage vital signs and the nursing notes.  76 year old female with PMH as noted above presents with shortness of breath, found to be in respiratory distress by EMS.  I reviewed the past medical records; the patient was most recently admitted in February.  Per the hospitalist discharge summary from 05/07/2021 she was admitted for for COPD exacerbation requiring BiPAP.  Differential diagnosis includes, but is not limited to, COPD exacerbation, acute bronchitis, pneumonia, COVID-19, new onset CHF, less likely other cardiac cause.  Patient's presentation is most consistent with acute presentation with potential threat to life or bodily function.  Within a few minutes of arriving to the ED the patient was placed on BiPAP and is tolerating it well.  O2 saturation is 100%.  She does have increased work of breathing but is able to speak in  short sentences.  We will obtain a chest x-ray, lab work-up, continue treatment with BiPAP, bronchodilators, and plan for admission.  The patient is on the cardiac monitor to evaluate for evidence of arrhythmia and/or significant heart rate changes.  ----------------------------------------- 5:09 PM on 08/10/2021 -----------------------------------------  The patient appears more comfortable.  Her vital signs are stable.  Respiratory rate has improved to the high 20s.  Lab work-up is overall reassuring.  She has no acidosis and the PCO2 is 43 on the VBG.  Respiratory panel is negative.  Troponin and BNP are minimally elevated.  WBC count is 11.  Blood chemistry is unremarkable.  Chest x-ray shows no focal consolidation or edema.  Overall presentation remains consistent with COPD exacerbation.  I consulted Dr. Marcello Moores from the hospitalist service; based on our discussion she agrees  to admit the patient.   FINAL CLINICAL IMPRESSION(S) / ED DIAGNOSES   Final diagnoses:  Acute respiratory failure with hypoxia (HCC)  COPD exacerbation ([place])     Rx / DC Orders   ED Discharge Orders     None        Note:  This document was prepared using Dragon voice recognition software and may include unintentional dictation errors.    Arta Silence, MD 08/10/21 [phone]

## 2021-08-10 NOTE — ED Triage Notes (Signed)
Pt arrived via EMS with nebs going prior to that pt was on non rebreather. Pt was in tripod position and working hard to breathe. Pt wears O2 at home and has a hx of COPD.

## 2021-08-10 NOTE — ED Notes (Signed)
Pt placed on female purewick at this time.

## 2021-08-10 NOTE — Progress Notes (Signed)
PHARMACIST - PHYSICIAN COMMUNICATION   CONCERNING: Methylprednisolone IV    Current order: Methylprednisolone IV '80mg'$  q8     DESCRIPTION: Per [facility] Protocol:   IV methylprednisolone will be converted to either a q12h or q24h frequency with the same total daily dose (TDD).  Ordered Dose: 1 to 125 mg TDD; convert to: TDD q24h.  Ordered Dose: 126 to 250 mg TDD; convert to: TDD div q12h.  Ordered Dose: >250 mg TDD; DAW.  Order has been adjusted to: Methylprednisolone IV '120mg'$  q12   Darrick Penna , PharmD, BCPS Clinical Pharmacist  08/10/2021 7:44 PM

## 2021-08-11 ENCOUNTER — Other Ambulatory Visit: Payer: Self-pay

## 2021-08-11 DIAGNOSIS — J441 Chronic obstructive pulmonary disease with (acute) exacerbation: Secondary | ICD-10-CM | POA: Diagnosis not present

## 2021-08-11 DIAGNOSIS — J9601 Acute respiratory failure with hypoxia: Secondary | ICD-10-CM | POA: Diagnosis not present

## 2021-08-11 LAB — RESPIRATORY PANEL BY PCR

## 2021-08-11 LAB — HEMOGLOBIN A1C
Hgb A1c MFr Bld: 5.4 % (ref 4.8–5.6)
Mean Plasma Glucose: 108.28 mg/dL

## 2021-08-11 LAB — COMPREHENSIVE METABOLIC PANEL
ALT: 11 U/L (ref 0–44)
AST: 17 U/L (ref 15–41)
Albumin: 3.2 g/dL — ABNORMAL LOW (ref 3.5–5.0)
Alkaline Phosphatase: 72 U/L (ref 38–126)
Anion gap: 6 (ref 5–15)
BUN: 19 mg/dL (ref 8–23)
CO2: 27 mmol/L (ref 22–32)
Calcium: 8.4 mg/dL — ABNORMAL LOW (ref 8.9–10.3)
Chloride: 106 mmol/L (ref 98–111)
Creatinine, Ser: 0.77 mg/dL (ref 0.44–1.00)
GFR, Estimated: >60 mL/min (ref >60)
Glucose, Bld: 175 mg/dL — ABNORMAL HIGH (ref 70–99)
Potassium: 3.7 mmol/L (ref 3.5–5.1)
Sodium: 139 mmol/L (ref 135–145)
Total Bilirubin: 0.4 mg/dL (ref 0.3–1.2)
Total Protein: 6.2 g/dL — ABNORMAL LOW (ref 6.5–8.1)

## 2021-08-11 LAB — CBC
HCT: 30.2 % — ABNORMAL LOW (ref 36.0–46.0)
Hemoglobin: 9.3 g/dL — ABNORMAL LOW (ref 12.0–15.0)
MCH: 30.1 pg (ref 26.0–34.0)
MCHC: 30.8 g/dL (ref 30.0–36.0)
MCV: 97.7 fL (ref 80.0–100.0)
Platelets: 193 10*3/uL (ref 150–400)
RBC: 3.09 MIL/uL — ABNORMAL LOW (ref 3.87–5.11)
RDW: 13 % (ref 11.5–15.5)
WBC: 6.5 10*3/uL (ref 4.0–10.5)
nRBC: 0 % (ref 0.0–0.2)

## 2021-08-11 LAB — GLUCOSE, CAPILLARY
Glucose-Capillary: 108 mg/dL — ABNORMAL HIGH (ref 70–99)
Glucose-Capillary: 139 mg/dL — ABNORMAL HIGH (ref 70–99)
Glucose-Capillary: 146 mg/dL — ABNORMAL HIGH (ref 70–99)
Glucose-Capillary: 152 mg/dL — ABNORMAL HIGH (ref 70–99)
Glucose-Capillary: 175 mg/dL — ABNORMAL HIGH (ref 70–99)

## 2021-08-11 LAB — CBG MONITORING, ED
Glucose-Capillary: 169 mg/dL — ABNORMAL HIGH (ref 70–99)
Glucose-Capillary: 140 mg/dL — ABNORMAL HIGH (ref 70–99)

## 2021-08-11 MED ORDER — CLONAZEPAM 0.5 MG PO TABS
1.0000 mg | ORAL_TABLET | Freq: Every morning | ORAL | Status: DC
Start: 2021-08-12 — End: 2021-08-12
  Administered 2021-08-12: 1 mg via ORAL
  Filled 2021-08-11: qty 2

## 2021-08-11 MED ORDER — PRAMIPEXOLE DIHYDROCHLORIDE 0.25 MG PO TABS
0.5000 mg | ORAL_TABLET | Freq: Two times a day (BID) | ORAL | Status: DC
  Administered 2021-08-11 – 2021-08-12 (×3): 0.5 mg via ORAL
  Filled 2021-08-11 (×4): qty 2

## 2021-08-11 MED ORDER — METHYLPREDNISOLONE SODIUM SUCC 125 MG IJ SOLR
80.0000 mg | Freq: Two times a day (BID) | INTRAMUSCULAR | Status: DC
  Administered 2021-08-11 – 2021-08-12 (×2): 80 mg via INTRAVENOUS
  Filled 2021-08-11 (×2): qty 2

## 2021-08-11 MED ORDER — FUROSEMIDE 10 MG/ML IJ SOLN
40.0000 mg | Freq: Every day | INTRAMUSCULAR | Status: DC
  Administered 2021-08-11 – 2021-08-12 (×2): 40 mg via INTRAVENOUS
  Filled 2021-08-11 (×2): qty 4

## 2021-08-11 MED ORDER — TRAZODONE HCL 50 MG PO TABS
150.0000 mg | ORAL_TABLET | Freq: Every day | ORAL | Status: DC
  Administered 2021-08-11: 150 mg via ORAL
  Filled 2021-08-11: qty 1

## 2021-08-11 NOTE — ED Notes (Addendum)
Pt placed onto hospital bed at this time and readjusted to a position of comfort. Pt remains on bipap and connected to all monitors. No other needs at this time.

## 2021-08-11 NOTE — ED Notes (Signed)
Per MD we can test pt off BIPAP. Placed on Frank at 4 liters, per what she wears at home.

## 2021-08-11 NOTE — ED Notes (Signed)
Pt given some applesauce.

## 2021-08-11 NOTE — Consult Note (Signed)
PULMONOLOGY         Date: 08/11/2021,   MRN# [id number] Kristin [name] 1945-08-23     AdmissionWeight: 72 kg                 CurrentWeight: 53 kg  Referring provider: Dr Marcello Moores   CHIEF COMPLAINT:   Acute on chronic hypoxemic respiratory failure with advanced COPD   HISTORY OF PRESENT ILLNESS   This is 76 year old female with a history of advanced COPD and chronic hypoxemia on 4 L supplemental oxygen at home, also has a background history of type 2 diabetes, dyslipidemia RLS, hypothyroidism and recurrent UTIs and major depression with anxiety overlap.  She came in with acute shortness of breath called EMS due to progressive worsening dyspnea with chest discomfort.  Noted to be in severe distress with labored breathing on arrival to the ED and required BiPAP.  Treated with IV magnesium Solu-Medrol and nebulizer therapy on arrival.  Subsequently improved on BiPAP.  Blood work was done with CBC showing mild leukocytosis she had respiratory viral panel was negative for flu and COVID.  VBG was performed with mild acidemic hypoxemic respiratory failure.  Previous CT chest with PE protocol and February showed advanced centrilobular emphysema with bronchitic subtype of COPD with bibasilar atelectasis.  There were no suspicious nodules for bronchogenic carcinoma.  Chest x-ray performed on this admission showed prominent airways with interstitial infiltrate suggestive of mild pulmonary edema.   PAST MEDICAL HISTORY   Past Medical History:  Diagnosis Date   Asthma    Cancer ([place]) 2010   Left ear cancer   COPD (chronic obstructive pulmonary disease) ([place])    COVID-19 05/27/2019   Diabetes mellitus without complication (HCC)    Emphysema lung ([place])    Hypertension    Stroke [facility])      SURGICAL HISTORY   Past Surgical History:  Procedure Laterality Date   ABDOMINAL HYSTERECTOMY     BREAST BIOPSY Left 1990   neg   CHOLECYSTECTOMY     COLONOSCOPY WITH PROPOFOL N/A  12/01/2017   Procedure: COLONOSCOPY WITH PROPOFOL;  Surgeon: Toledo, Benay Pike, MD;  Location: [facility] ENDOSCOPY;  Service: Gastroenterology;  Laterality: N/A;   ESOPHAGOGASTRODUODENOSCOPY N/A 12/01/2017   Procedure: ESOPHAGOGASTRODUODENOSCOPY (EGD);  Surgeon: Toledo, Benay Pike, MD;  Location: [facility] ENDOSCOPY;  Service: Gastroenterology;  Laterality: N/A;   NECK SURGERY       FAMILY HISTORY   Family History  Problem Relation Age of Onset   Ovarian cancer Mother    Lung cancer Father    Breast cancer Paternal Aunt      SOCIAL HISTORY   Social History   Tobacco Use   Smoking status: Former    Passive exposure: Past   Smokeless tobacco: Never  Vaping Use   Vaping Use: Never used  Substance Use Topics   Alcohol use: No   Drug use: No     MEDICATIONS    Home Medication:    Current Medication:  Current Facility-Administered Medications:    acetaminophen (TYLENOL) tablet 650 mg, 650 mg, Oral, Q6H PRN **OR** acetaminophen (TYLENOL) suppository 650 mg, 650 mg, Rectal, Q6H PRN, Myles Rosenthal A, MD   albuterol (PROVENTIL) (2.5 MG/3ML) 0.083% nebulizer solution 2.5 mg, 2.5 mg, Inhalation, Q3H PRN, Myles Rosenthal A, MD   albuterol (PROVENTIL) (2.5 MG/3ML) 0.083% nebulizer solution 2.5 mg, 2.5 mg, Nebulization, Q2H PRN, Myles Rosenthal A, MD   amLODipine (NORVASC) tablet 5 mg, 5 mg, Oral, Daily, Clance Boll, MD, 5  mg at 08/11/21 1036   atorvastatin (LIPITOR) tablet 40 mg, 40 mg, Oral, Daily, Myles Rosenthal A, MD, 40 mg at 08/11/21 1036   citalopram (CELEXA) tablet 10 mg, 10 mg, Oral, Daily, Myles Rosenthal A, MD, 10 mg at 08/11/21 1037   [START ON 08/12/2021] clonazePAM (KLONOPIN) tablet 1 mg, 1 mg, Oral, q AM, Richarda Osmond, MD   enoxaparin (LOVENOX) injection 40 mg, 40 mg, Subcutaneous, Q24H, Myles Rosenthal A, MD, 40 mg at 08/10/21 2333   fenofibrate tablet 160 mg, 160 mg, Oral, Daily, Myles Rosenthal A, MD, 160 mg at 08/11/21 1041   fluticasone  furoate-vilanterol (BREO ELLIPTA) 100-25 MCG/ACT 1 puff, 1 puff, Inhalation, Daily **AND** umeclidinium bromide (INCRUSE ELLIPTA) 62.5 MCG/ACT 1 puff, 1 puff, Inhalation, Daily, Myles Rosenthal A, MD   hydrALAZINE (APRESOLINE) tablet 25 mg, 25 mg, Oral, BID, Myles Rosenthal A, MD, 25 mg at 08/11/21 1037   insulin aspart (novoLOG) injection 0-9 Units, 0-9 Units, Subcutaneous, Q4H, Myles Rosenthal A, MD, 1 Units at 08/11/21 0915   ipratropium-albuterol (DUONEB) 0.5-2.5 (3) MG/3ML nebulizer solution 3 mL, 3 mL, Nebulization, Q6H, Myles Rosenthal A, MD, 3 mL at 08/11/21 0913   levofloxacin (LEVAQUIN) IVPB 750 mg, 750 mg, Intravenous, Q24H, Clance Boll, MD, Stopped at 08/10/21 2013   levothyroxine (SYNTHROID) tablet 112 mcg, 112 mcg, Oral, QAC breakfast, Clance Boll, MD   losartan (COZAAR) tablet 100 mg, 100 mg, Oral, Daily, Myles Rosenthal A, MD, 100 mg at 08/11/21 1037   meclizine (ANTIVERT) tablet 25 mg, 25 mg, Oral, TID PRN, Clance Boll, MD   methylPREDNISolone sodium succinate (SOLU-MEDROL) 125 mg/2 mL injection 120 mg, 120 mg, Intravenous, Q12H, 120 mg at 08/11/21 1035 **FOLLOWED BY** [START ON 08/13/2021] predniSONE (DELTASONE) tablet 40 mg, 40 mg, Oral, Q breakfast, Myles Rosenthal A, MD   montelukast (SINGULAIR) tablet 10 mg, 10 mg, Oral, QHS, Myles Rosenthal A, MD   ondansetron (ZOFRAN) tablet 4 mg, 4 mg, Oral, Q6H PRN **OR** ondansetron (ZOFRAN) injection 4 mg, 4 mg, Intravenous, Q6H PRN, Clance Boll, MD   pantoprazole (PROTONIX) EC tablet 40 mg, 40 mg, Oral, BID WC, Myles Rosenthal A, MD   pramipexole (MIRAPEX) tablet 0.5 mg, 0.5 mg, Oral, BID, Doristine Mango L, MD   sodium chloride flush (NS) 0.9 % injection 3 mL, 3 mL, Intravenous, Q12H, Myles Rosenthal A, MD, 3 mL at 08/11/21 1039   tiotropium (SPIRIVA) inhalation capsule ([facility] use ONLY) 18 mcg, 1 capsule, Inhalation, Q1200, Clance Boll, MD   traZODone (DESYREL) tablet 150 mg, 150  mg, Oral, QHS, Richarda Osmond, MD   vitamin B-12 (CYANOCOBALAMIN) tablet 1,000 mcg, 1,000 mcg, Oral, Daily, Myles Rosenthal A, MD, 1,000 mcg at 08/11/21 1041    ALLERGIES   Codeine, Morphine and related, and Prednisone     REVIEW OF SYSTEMS    Review of Systems:  Gen:  Denies  fever, sweats, chills weigh loss  HEENT: Denies blurred vision, double vision, ear pain, eye pain, hearing loss, nose bleeds, sore throat Cardiac:  No dizziness, chest pain or heaviness, chest tightness,edema Resp:   reports dyspnea chronically  Gi: Denies swallowing difficulty, stomach pain, nausea or vomiting, diarrhea, constipation, bowel incontinence Gu:  Denies bladder incontinence, burning urine Ext:   Denies Joint pain, stiffness or swelling Skin: Denies  skin rash, easy bruising or bleeding or hives Endoc:  Denies polyuria, polydipsia , polyphagia or weight change Psych:   Denies depression, insomnia or hallucinations   Other:  All other systems negative  VS: BP 120/63   Pulse 78   Temp 98.9 F (37.2 C) (Oral)   Resp (!) 21   Ht '5\' 4"'$  (1.626 m)   Wt 53 kg   SpO2 100%   BMI 20.06 kg/m      PHYSICAL EXAM    GENERAL:NAD, no fevers, chills, no weakness no fatigue HEAD: Normocephalic, atraumatic.  EYES: Pupils equal, round, reactive to light. Extraocular muscles intact. No scleral icterus.  MOUTH: Moist mucosal membrane. Dentition intact. No abscess noted.  EAR, NOSE, THROAT: Clear without exudates. No external lesions.  NECK: Supple. No thyromegaly. No nodules. No JVD.  PULMONARY: decreased breath sounds with mild rhonchi worse at bases bilaterally.  CARDIOVASCULAR: S1 and S2. Regular rate and rhythm. No murmurs, rubs, or gallops. No edema. Pedal pulses 2+ bilaterally.  GASTROINTESTINAL: Soft, nontender, nondistended. No masses. Positive bowel sounds. No hepatosplenomegaly.  MUSCULOSKELETAL: No swelling, clubbing, or edema. Range of motion full in all extremities.   NEUROLOGIC: Cranial nerves II through XII are intact. No gross focal neurological deficits. Sensation intact. Reflexes intact.  SKIN: No ulceration, lesions, rashes, or cyanosis. Skin warm and dry. Turgor intact.  PSYCHIATRIC: Mood, affect within normal limits. The patient is awake, alert and oriented x 3. Insight, judgment intact.       IMAGING     ASSESSMENT/PLAN   Acute on chronic hypoxemic respiratory failure  - RVP is negative    -there are bilateral intestitial infiltrates suggestive of pulmonary edema  -currently on solumedrol 120 bid , will reduce to 80 bid now.  -also on Levofloxacin which is good due to concomitant recurrent UTIs -Duoneb -Breo Ellipta and Incruse.   -on treelgy at home -respiratory cultures  Bibasilar atelectasis   Continue incentive spirometry   Mild inerstitial edema   Will diurese with lasix 40   - will hold hydralazine for now to avoid transient hypotension      Thank you for allowing me to participate in the care of this patient.   Patient/Family are satisfied with care plan and all questions have been answered.    Provider disclosure: Patient with at least one acute or chronic illness or injury that poses a threat to life or bodily function and is being managed actively during this encounter.  All of the below services have been performed independently by signing provider:  review of prior documentation from internal and or external health records.  Review of previous and current lab results.  Interview and comprehensive assessment during patient visit today. Review of current and previous chest radiographs/CT scans. Discussion of management and test interpretation with health care team and patient/family.   This document was prepared using Dragon voice recognition software and may include unintentional dictation errors.     Ottie Glazier, M.D.  Division of Pulmonary & Critical Care Medicine

## 2021-08-11 NOTE — Progress Notes (Signed)
PROGRESS NOTE  Kristin [name]    DOB: January 09, 1946, 76 y.o.  SUP:[id number]    Code Status: Full Code   DOA: 08/10/2021   LOS: 1   Brief hospital course  Kristin [name] is a 76 y.o. female with a PMH significant for COPD with chronic hypoxic respiratory failure on 4L home O2, Type 2 DM, Hyperlipidemia, RLS, Hypothyroidism, Recurrent UTI's Depression and Anxiety.  They presented from home to the ED on 08/10/2021 with acute worsening of SOB x a few days. She was placed on Bipap in the ED.   In the ED, it was found that they had mild tachycardia to 104, tachypnea to 30, increased O2 requirement with Bipap and 30% FiO2. Normothermix and normotensive.  Significant findings included K+ 3.3, Cr 0.8, WBC 11, hgb 10, troponin 27>25>27, BNP 178.4, LA 0.9, D-dimer 1.31. VBG while on Bipap- pH 7.37, pCO2 43, pO2 51, bicarb 24.9 Respiratory panel negative Chest xray negative.  They were initially treated with albuterol nebulizers, Bipap, fentanyl, duoneb, solumedrol, levaquin, magnesium.   Patient was admitted to medicine service for further workup and management of acute on chronic hypoxic respiratory failure as outlined in detail below.  08/11/21 -stable, improved  Assessment & Plan  Principal Problem:   Acute exacerbation of chronic obstructive pulmonary disease (COPD) (HCC)  Acute on chronic hypoxic respiratory failure  COPD on 4L [place] chronically- viral panel negative. Chest xray negative acute infiltrates. Suspect exacerbation of COPD with allergies or viral. Patient able to wean from bipap this am. Currently on her home 4L while at rest.  - ambulate with pulse ox - continue IV Abx - continue duonebs - continue IV steroids - recommend observation overnight - loratadine daily - f/u sputum culture  Type II DM  HLD - sSSI - continue home medications  RLS - continue home medications  hypothyroidism - continue home medications  Depression  anxiety- - continue home  medications  Body mass index is 20.06 kg/m.  VTE ppx: enoxaparin (LOVENOX) injection 40 mg Start: 08/10/21 2200   Diet:     Diet   Diet NPO time specified   Consultants: None  Subjective 08/11/21    Pt reports wanting to take off Bipap. Endorses improvement in her breathing.  She also states that she is hungry.   Objective   Vitals:   08/11/21 0530 08/11/21 0630 08/11/21 0700 08/11/21 0730  BP: 123/60 (!) 132/57 (!) 134/58 (!) 142/62  Pulse: 69 70 70 71  Resp: (!) 24 (!) 24 (!) 22 18  Temp:      TempSrc:      SpO2: 97% 96% 96% 95%  Weight:      Height:        Intake/Output Summary (Last 24 hours) at 08/11/2021 0817 Last data filed at 08/10/2021 2155 Gross per 24 hour  Intake 700 ml  Output --  Net 700 ml   Filed Weights   08/10/21 1524  Weight: 53 kg     Physical Exam:  General: awake, alert, NAD HEENT: atraumatic, clear conjunctiva, anicteric sclera, MMM, hearing grossly normal Respiratory: normal respiratory effort. Positive rhonchi Cardiovascular: quick capillary refill, normal S1/S2, RRR, no JVD, murmurs Nervous: A&O x3. no gross focal neurologic deficits Extremities: moves all equally, trace LE edema, normal tone Skin: dry, intact, normal temperature, normal color. No rashes, lesions or ulcers on exposed skin Psychiatry: normal mood, congruent affect  Labs   I have personally reviewed the following labs and imaging studies CBC    Component  Value Date/Time   WBC 6.5 08/11/2021 0403   RBC 3.09 (L) 08/11/2021 0403   HGB 9.3 (L) 08/11/2021 0403   HGB 11.5 (L) 06/07/2013 0403   HCT 30.2 (L) 08/11/2021 0403   HCT 33.7 (L) 06/07/2013 0403   PLT 193 08/11/2021 0403   PLT 149 (L) 06/07/2013 0403   MCV 97.7 08/11/2021 0403   MCV 98 06/07/2013 0403   MCH 30.1 08/11/2021 0403   MCHC 30.8 08/11/2021 0403   RDW 13.0 08/11/2021 0403   RDW 13.4 06/07/2013 0403   LYMPHSABS 3.0 08/10/2021 1520   LYMPHSABS 0.9 (L) 06/07/2013 0403   MONOABS 1.1 (H)  08/10/2021 1520   MONOABS 0.3 06/07/2013 0403   EOSABS 0.0 08/10/2021 1520   EOSABS 0.0 06/07/2013 0403   BASOSABS 0.1 08/10/2021 1520   BASOSABS 0.0 06/07/2013 0403      Latest Ref Rng & Units 08/11/2021    4:03 AM 08/10/2021    3:20 PM 06/28/2021    2:59 PM  BMP  Glucose 70 - 99 mg/dL 175   117   136    BUN 8 - 23 mg/dL '19   13   18    '$ Creatinine 0.44 - 1.00 mg/dL 0.77   0.80   1.18    Sodium 135 - 145 mmol/L 139   140   139    Potassium 3.5 - 5.1 mmol/L 3.7   3.3   4.4    Chloride 98 - 111 mmol/L 106   105   105    CO2 22 - 32 mmol/L '27   26   27    '$ Calcium 8.9 - 10.3 mg/dL 8.4   8.7   9.4      DG Chest Port 1 View  Result Date: 08/10/2021 CLINICAL DATA:  Short of breath. EXAM: PORTABLE CHEST 1 VIEW COMPARISON:  06/28/2021 and older exams. FINDINGS: Cardiac silhouette is normal in size and configuration. Normal mediastinal and hilar contours. Lungs are hyperexpanded but clear. No pleural effusion or pneumothorax. Skeletal structures are grossly intact. IMPRESSION: No acute cardiopulmonary disease. Electronically Signed   By: Lajean Manes M.D.   On: 08/10/2021 15:33    Disposition Plan & Communication  Patient status: Inpatient  Admitted From: Home Planned disposition location: Home Anticipated discharge date: 5/29 pending respiratory stabilization   Family Communication: none     Author: Richarda Osmond, DO [facility] 08/11/2021, 8:17 AM   Available by Epic secure chat 7AM-7PM. If 7PM-7AM, please contact night-coverage.  [facility] contact information found on [email or web address].

## 2021-08-12 DIAGNOSIS — J441 Chronic obstructive pulmonary disease with (acute) exacerbation: Secondary | ICD-10-CM | POA: Diagnosis not present

## 2021-08-12 DIAGNOSIS — J9601 Acute respiratory failure with hypoxia: Secondary | ICD-10-CM | POA: Diagnosis not present

## 2021-08-12 LAB — BASIC METABOLIC PANEL
Anion gap: 9 (ref 5–15)
BUN: 27 mg/dL — ABNORMAL HIGH (ref 8–23)
CO2: 28 mmol/L (ref 22–32)
Calcium: 8.3 mg/dL — ABNORMAL LOW (ref 8.9–10.3)
Chloride: 104 mmol/L (ref 98–111)
Creatinine, Ser: 0.91 mg/dL (ref 0.44–1.00)
GFR, Estimated: >60 mL/min (ref >60)
Glucose, Bld: 147 mg/dL — ABNORMAL HIGH (ref 70–99)
Potassium: 3.6 mmol/L (ref 3.5–5.1)
Sodium: 141 mmol/L (ref 135–145)

## 2021-08-12 LAB — CBC
HCT: 31 % — ABNORMAL LOW (ref 36.0–46.0)
Hemoglobin: 9.7 g/dL — ABNORMAL LOW (ref 12.0–15.0)
MCH: 29.8 pg (ref 26.0–34.0)
MCHC: 31.3 g/dL (ref 30.0–36.0)
MCV: 95.1 fL (ref 80.0–100.0)
Platelets: 243 10*3/uL (ref 150–400)
RBC: 3.26 MIL/uL — ABNORMAL LOW (ref 3.87–5.11)
RDW: 13 % (ref 11.5–15.5)
WBC: 10.3 10*3/uL (ref 4.0–10.5)
nRBC: 0 % (ref 0.0–0.2)

## 2021-08-12 LAB — GLUCOSE, CAPILLARY
Glucose-Capillary: 142 mg/dL — ABNORMAL HIGH (ref 70–99)
Glucose-Capillary: 103 mg/dL — ABNORMAL HIGH (ref 70–99)
Glucose-Capillary: 167 mg/dL — ABNORMAL HIGH (ref 70–99)

## 2021-08-12 MED ORDER — PREDNISONE 20 MG PO TABS: 20.0000 mg | ORAL_TABLET | Freq: Every day | ORAL | Status: DC

## 2021-08-12 MED ORDER — LEVOFLOXACIN 500 MG PO TABS
750.0000 mg | ORAL_TABLET | Freq: Every day | ORAL | Status: DC
  Administered 2021-08-12: 750 mg via ORAL
  Filled 2021-08-12: qty 1
  Filled 2021-08-12 (×2): qty 2

## 2021-08-12 MED ORDER — PREDNISONE 20 MG PO TABS
40.0000 mg | ORAL_TABLET | Freq: Every day | ORAL | 0 refills | Status: AC
  Filled 2021-08-12: qty 10, 5d supply, fill #0

## 2021-08-12 MED ORDER — LEVOFLOXACIN 750 MG PO TABS
750.0000 mg | ORAL_TABLET | Freq: Every day | ORAL | 0 refills | Status: AC
  Filled 2021-08-12: qty 5, 5d supply, fill #0

## 2021-08-12 MED ORDER — ACETAMINOPHEN 325 MG PO TABS: 650.0000 mg | ORAL_TABLET | Freq: Four times a day (QID) | ORAL | Status: AC | PRN

## 2021-08-12 MED ORDER — LEVOFLOXACIN 500 MG PO TABS
750.0000 mg | ORAL_TABLET | Freq: Every day | ORAL | Status: DC
Start: 2021-08-13 — End: 2021-08-12

## 2021-08-12 MED ORDER — IPRATROPIUM-ALBUTEROL 0.5-2.5 (3) MG/3ML IN SOLN: 3.0000 mL | RESPIRATORY_TRACT | Status: DC | PRN

## 2021-08-12 MED ORDER — IPRATROPIUM-ALBUTEROL 0.5-2.5 (3) MG/3ML IN SOLN
3.0000 mL | Freq: Two times a day (BID) | RESPIRATORY_TRACT | Status: DC
  Administered 2021-08-12: 3 mL via RESPIRATORY_TRACT
  Filled 2021-08-12: qty 3

## 2021-08-12 MED ORDER — PREDNISONE 20 MG PO TABS
40.0000 mg | ORAL_TABLET | Freq: Every day | ORAL | Status: DC
  Filled 2021-08-12: qty 2

## 2021-08-12 NOTE — Progress Notes (Signed)
PULMONOLOGY         Date: 08/12/2021,   MRN# [id number] Kristin [name] [birth year]     AdmissionWeight: 53 kg                 CurrentWeight: 54.2 kg  Referring provider: Dr Marcello Moores   CHIEF COMPLAINT:   Acute on chronic hypoxemic respiratory failure with advanced COPD   HISTORY OF PRESENT ILLNESS   This is 76 year old female with a history of advanced COPD and chronic hypoxemia on 4 L supplemental oxygen at home, also has a background history of type 2 diabetes, dyslipidemia RLS, hypothyroidism and recurrent UTIs and major depression with anxiety overlap.  She came in with acute shortness of breath called EMS due to progressive worsening dyspnea with chest discomfort.  Noted to be in severe distress with labored breathing on arrival to the ED and required BiPAP.  Treated with IV magnesium Solu-Medrol and nebulizer therapy on arrival.  Subsequently improved on BiPAP.  Blood work was done with CBC showing mild leukocytosis she had respiratory viral panel was negative for flu and COVID.  VBG was performed with mild acidemic hypoxemic respiratory failure.  Previous CT chest with PE protocol and February showed advanced centrilobular emphysema with bronchitic subtype of COPD with bibasilar atelectasis.  There were no suspicious nodules for bronchogenic carcinoma.  Chest x-ray performed on this admission showed prominent airways with interstitial infiltrate suggestive of mild pulmonary edema.  08/12/21- patient is stable for dc home. She has family in room we discussed her care plan and outpatient follow up with Dr Raul Del [facility] pulmonology. She is on levofloxacin and prednisone and can finish 5day course of levoquin at '500mg'$  with pred 40 to be tapered by '5mg'$  daily  PAST MEDICAL HISTORY   Past Medical History:  Diagnosis Date   Asthma    Cancer ([place]) 2010   Left ear cancer   COPD (chronic obstructive pulmonary disease) ([place])    COVID-19 05/27/2019   Diabetes mellitus without  complication (HCC)    Emphysema lung (HCC)    Hypertension    Stroke [facility])      SURGICAL HISTORY   Past Surgical History:  Procedure Laterality Date   ABDOMINAL HYSTERECTOMY     BREAST BIOPSY Left 1990   neg   CHOLECYSTECTOMY     COLONOSCOPY WITH PROPOFOL N/A 12/01/2017   Procedure: COLONOSCOPY WITH PROPOFOL;  Surgeon: Toledo, Benay Pike, MD;  Location: [facility] ENDOSCOPY;  Service: Gastroenterology;  Laterality: N/A;   ESOPHAGOGASTRODUODENOSCOPY N/A 12/01/2017   Procedure: ESOPHAGOGASTRODUODENOSCOPY (EGD);  Surgeon: Toledo, Benay Pike, MD;  Location: [facility] ENDOSCOPY;  Service: Gastroenterology;  Laterality: N/A;   NECK SURGERY       FAMILY HISTORY   Family History  Problem Relation Age of Onset   Ovarian cancer Mother    Lung cancer Father    Breast cancer Paternal Aunt      SOCIAL HISTORY   Social History   Tobacco Use   Smoking status: Former    Passive exposure: Past   Smokeless tobacco: Never  Vaping Use   Vaping Use: Never used  Substance Use Topics   Alcohol use: No   Drug use: No     MEDICATIONS    Home Medication:    Current Medication:  Current Facility-Administered Medications:    acetaminophen (TYLENOL) tablet 650 mg, 650 mg, Oral, Q6H PRN, 650 mg at 08/11/21 2047 **OR** acetaminophen (TYLENOL) suppository 650 mg, 650 mg, Rectal, Q6H PRN, Clance Boll, MD  albuterol (PROVENTIL) (2.5 MG/3ML) 0.083% nebulizer solution 2.5 mg, 2.5 mg, Inhalation, Q3H PRN, Myles Rosenthal A, MD   albuterol (PROVENTIL) (2.5 MG/3ML) 0.083% nebulizer solution 2.5 mg, 2.5 mg, Nebulization, Q2H PRN, Myles Rosenthal A, MD   amLODipine (NORVASC) tablet 5 mg, 5 mg, Oral, Daily, Myles Rosenthal A, MD, 5 mg at 08/11/21 1036   atorvastatin (LIPITOR) tablet 40 mg, 40 mg, Oral, Daily, Myles Rosenthal A, MD, 40 mg at 08/11/21 1036   citalopram (CELEXA) tablet 10 mg, 10 mg, Oral, Daily, Myles Rosenthal A, MD, 10 mg at 08/11/21 1037   clonazePAM (KLONOPIN) tablet 1 mg,  1 mg, Oral, q AM, Richarda Osmond, MD   enoxaparin (LOVENOX) injection 40 mg, 40 mg, Subcutaneous, Q24H, Myles Rosenthal A, MD, 40 mg at 08/11/21 2048   fenofibrate tablet 160 mg, 160 mg, Oral, Daily, Myles Rosenthal A, MD, 160 mg at 08/11/21 1041   fluticasone furoate-vilanterol (BREO ELLIPTA) 100-25 MCG/ACT 1 puff, 1 puff, Inhalation, Daily **AND** umeclidinium bromide (INCRUSE ELLIPTA) 62.5 MCG/ACT 1 puff, 1 puff, Inhalation, Daily, Myles Rosenthal A, MD   furosemide (LASIX) injection 40 mg, 40 mg, Intravenous, Daily, Ottie Glazier, MD, 40 mg at 08/11/21 1400   insulin aspart (novoLOG) injection 0-9 Units, 0-9 Units, Subcutaneous, Q4H, Myles Rosenthal A, MD, 1 Units at 08/12/21 0439   ipratropium-albuterol (DUONEB) 0.5-2.5 (3) MG/3ML nebulizer solution 3 mL, 3 mL, Nebulization, BID, Richarda Osmond, MD   levofloxacin (LEVAQUIN) IVPB 750 mg, 750 mg, Intravenous, Q24H, Myles Rosenthal A, MD, Last Rate: 100 mL/hr at 08/11/21 1735, 750 mg at 08/11/21 1735   levothyroxine (SYNTHROID) tablet 112 mcg, 112 mcg, Oral, QAC breakfast, Clance Boll, MD   losartan (COZAAR) tablet 100 mg, 100 mg, Oral, Daily, Myles Rosenthal A, MD, 100 mg at 08/11/21 1037   meclizine (ANTIVERT) tablet 25 mg, 25 mg, Oral, TID PRN, Clance Boll, MD   methylPREDNISolone sodium succinate (SOLU-MEDROL) 125 mg/2 mL injection 80 mg, 80 mg, Intravenous, Q12H, Ottie Glazier, MD, 80 mg at 08/11/21 2048   montelukast (SINGULAIR) tablet 10 mg, 10 mg, Oral, QHS, Myles Rosenthal A, MD, 10 mg at 08/11/21 2048   ondansetron (ZOFRAN) tablet 4 mg, 4 mg, Oral, Q6H PRN **OR** ondansetron (ZOFRAN) injection 4 mg, 4 mg, Intravenous, Q6H PRN, Clance Boll, MD   pantoprazole (PROTONIX) EC tablet 40 mg, 40 mg, Oral, BID WC, Myles Rosenthal A, MD, 40 mg at 08/11/21 1400   pramipexole (MIRAPEX) tablet 0.5 mg, 0.5 mg, Oral, BID, Doristine Mango L, MD, 0.5 mg at 08/11/21 2052   sodium chloride flush (NS)  0.9 % injection 3 mL, 3 mL, Intravenous, Q12H, Myles Rosenthal A, MD, 3 mL at 08/11/21 2049   traZODone (DESYREL) tablet 150 mg, 150 mg, Oral, QHS, Richarda Osmond, MD, 150 mg at 08/11/21 2047   vitamin B-12 (CYANOCOBALAMIN) tablet 1,000 mcg, 1,000 mcg, Oral, Daily, Myles Rosenthal A, MD, 1,000 mcg at 08/11/21 1041    ALLERGIES   Codeine, Morphine and related, and Prednisone     REVIEW OF SYSTEMS    Review of Systems:  Gen:  Denies  fever, sweats, chills weigh loss  HEENT: Denies blurred vision, double vision, ear pain, eye pain, hearing loss, nose bleeds, sore throat Cardiac:  No dizziness, chest pain or heaviness, chest tightness,edema Resp:   reports dyspnea chronically  Gi: Denies swallowing difficulty, stomach pain, nausea or vomiting, diarrhea, constipation, bowel incontinence Gu:  Denies bladder incontinence, burning urine Ext:   Denies Joint pain, stiffness or swelling  Skin: Denies  skin rash, easy bruising or bleeding or hives Endoc:  Denies polyuria, polydipsia , polyphagia or weight change Psych:   Denies depression, insomnia or hallucinations   Other:  All other systems negative   VS: BP 140/61 (BP Location: Left Arm)   Pulse 70   Temp 98.2 F (36.8 C)   Resp 20   Ht '5\' 4"'$  (1.626 m)   Wt 54.2 kg   SpO2 100%   BMI 20.51 kg/m      PHYSICAL EXAM    GENERAL:NAD, no fevers, chills, no weakness no fatigue HEAD: Normocephalic, atraumatic.  EYES: Pupils equal, round, reactive to light. Extraocular muscles intact. No scleral icterus.  MOUTH: Moist mucosal membrane. Dentition intact. No abscess noted.  EAR, NOSE, THROAT: Clear without exudates. No external lesions.  NECK: Supple. No thyromegaly. No nodules. No JVD.  PULMONARY: decreased breath sounds with mild rhonchi worse at bases bilaterally.  CARDIOVASCULAR: S1 and S2. Regular rate and rhythm. No murmurs, rubs, or gallops. No edema. Pedal pulses 2+ bilaterally.  GASTROINTESTINAL: Soft, nontender,  nondistended. No masses. Positive bowel sounds. No hepatosplenomegaly.  MUSCULOSKELETAL: No swelling, clubbing, or edema. Range of motion full in all extremities.  NEUROLOGIC: Cranial nerves II through XII are intact. No gross focal neurological deficits. Sensation intact. Reflexes intact.  SKIN: No ulceration, lesions, rashes, or cyanosis. Skin warm and dry. Turgor intact.  PSYCHIATRIC: Mood, affect within normal limits. The patient is awake, alert and oriented x 3. Insight, judgment intact.       IMAGING     ASSESSMENT/PLAN   Acute on chronic hypoxemic respiratory failure  - RVP is negative    -there are bilateral intestitial infiltrates suggestive of pulmonary edema  -currently on solumedrol 120 bid , will reduce to 80 bid now.  -also on Levofloxacin which is good due to concomitant recurrent UTIs -Duoneb -Breo Ellipta and Incruse.   -on treelgy at home -respiratory cultures  Bibasilar atelectasis   Continue incentive spirometry   Mild inerstitial edema   Will diurese with lasix 40   - will hold hydralazine for now to avoid transient hypotension      Thank you for allowing me to participate in the care of this patient.   Patient/Family are satisfied with care plan and all questions have been answered.    Provider disclosure: Patient with at least one acute or chronic illness or injury that poses a threat to life or bodily function and is being managed actively during this encounter.  All of the below services have been performed independently by signing provider:  review of prior documentation from internal and or external health records.  Review of previous and current lab results.  Interview and comprehensive assessment during patient visit today. Review of current and previous chest radiographs/CT scans. Discussion of management and test interpretation with health care team and patient/family.   This document was prepared using Dragon voice recognition software and  may include unintentional dictation errors.     Ottie Glazier, M.D.  Division of Pulmonary & Critical Care Medicine

## 2021-08-12 NOTE — TOC Initial Note (Signed)
Transition of Care [facility]) - Initial/Assessment Note    Patient Details  Name: Kristin [name] MRN: [id number] Date of Birth: 04-24-1945  Transition of Care [facility]) CM/SW Contact:    Alberteen Sam, LCSW Phone Number: 08/12/2021, 8:08 AM  Clinical Narrative:                  Per chart review readmission risk assessment completed, patient is from home with her husband. Husband typically assists patient at home as needed.   Patient's grandson lives very close to her and he is "available night or day" to assist patient and spouse with needs.  Grandson handles medications for patient and spouse as well.   Patient has a walker, bedside commode and shower chair at home.   Patient hx of refusing SNF as she has not left her husband's side for 40 plus years.   Patient and husband have [place] to assist them at home with baths.  She states they have PAN  Patient Assistance.  This allows them to obtain a voucher to access [facility] for transportation to appointments.  In the event they are unable to take [facility], their grandchildren can provide transportation for them   Patient hx of [place].   TOC will follow for discharge planning and dispo recommendations.   Expected Discharge Plan: [place] Barriers to Discharge: Continued Medical Work up   Patient Goals and CMS Choice Patient states their goals for this hospitalization and ongoing recovery are:: to go home CMS Medicare.gov Compare Post Acute Care list provided to:: Patient Choice offered to / list presented to : Patient  Expected Discharge Plan and Services Expected Discharge Plan: [place]       Living arrangements for the past 2 months: Single Family Home                                      Prior Living Arrangements/Services Living arrangements for the past 2 months: Single Family Home Lives with:: Self                   Activities of Daily Living Home  Assistive Devices/Equipment: Environmental consultant (specify type), Shower chair with back, Bedside commode/3-in-1 ADL Screening (condition at time of admission) Patient's cognitive ability adequate to safely complete daily activities?: Yes Is the patient deaf or have difficulty hearing?: No Does the patient have difficulty seeing, even when wearing glasses/contacts?: No Does the patient have difficulty concentrating, remembering, or making decisions?: No Patient able to express need for assistance with ADLs?: Yes Does the patient have difficulty dressing or bathing?: Yes Independently performs ADLs?: No Does the patient have difficulty walking or climbing stairs?: Yes Weakness of Legs: Both Weakness of Arms/Hands: None  Permission Sought/Granted                  Emotional Assessment       Orientation: : Oriented to Self, Oriented to  Time, Oriented to Place, Oriented to Situation Alcohol / Substance Use: Not Applicable Psych Involvement: No (comment)  Admission diagnosis:  Acute exacerbation of chronic obstructive pulmonary disease (COPD) ([place]) [J44.1] COPD exacerbation ([place]) [J44.1] Acute respiratory failure with hypoxia ([place]) [J96.01] Patient Active Problem List   Diagnosis Date Noted   Respiratory distress 05/05/2021   COPD exacerbation ([place]) 05/05/2021   Shortness of breath 05/04/2021   Acute exacerbation of chronic obstructive  pulmonary disease (COPD) ([place]) 02/22/2020   RLQ abdominal pain 07/22/2019   Acute respiratory failure due to COVID-19 ([place]) 05/30/2019   Carotid stenosis 10/26/2017   Pyelonephritis 09/02/2017   UTI (urinary tract infection) 11/30/2016   Encephalopathy acute 11/30/2016   Chronic respiratory failure with hypoxia ([place]) 11/30/2016   Weakness generalized 11/30/2016   Acute encephalopathy 11/30/2016   Acute respiratory failure ([place]) 05/23/2016   Malnutrition of moderate degree 05/23/2016   Syncope 10/23/2015   Major depression single episode, in partial  remission ([place]) 09/07/2015   Hypothyroidism, unspecified 06/02/2014   Benign essential hypertension 06/02/2014   Gastroesophageal reflux disease without esophagitis 11/23/2013   Diabetes mellitus type 2, uncomplicated ([place]) 81/19/1478   PCP:  Tracie Harrier, MD Pharmacy:   [facility] [address] [place] [address] Phone: [phone] Fax: [phone]     Social Determinants of Health (SDOH) Interventions    Readmission Risk Interventions    05/06/2021   10:38 AM 06/03/2019   12:34 PM  Readmission Risk Prevention Plan  Transportation Screening Complete Complete  PCP or Specialist Appt within 5-7 Days Complete   PCP or Specialist Appt within 3-5 Days  Patient refused  Home Care Screening Complete   Medication Review (RN CM) Complete   HRI or Home Care Consult  Complete  Medication Review (RN Care Manager)  Complete

## 2021-08-12 NOTE — Progress Notes (Signed)
PIV removed. AVS reviewed with patient and grandaughter. Patient verbalizes understanding of medication regimen and follow up appointments.

## 2021-08-12 NOTE — Progress Notes (Incomplete)
PROGRESS NOTE  Kristin [name]    DOB: 11/28/1945, 76 y.o.  MOQ:[id number]    Code Status: Full Code   DOA: 08/10/2021   LOS: 2   Brief hospital course  Kristin [name] is a 76 y.o. female with a PMH significant for COPD with chronic hypoxic respiratory failure on 4L home O2, Type 2 DM, Hyperlipidemia, RLS, Hypothyroidism, Recurrent UTI's Depression and Anxiety.  They presented from home to the ED on 08/10/2021 with acute worsening of SOB x a few days. She was placed on Bipap in the ED.   In the ED, it was found that they had mild tachycardia to 104, tachypnea to 30, increased O2 requirement with Bipap and 30% FiO2. Normothermix and normotensive.  Significant findings included K+ 3.3, Cr 0.8, WBC 11, hgb 10, troponin 27>25>27, BNP 178.4, LA 0.9, D-dimer 1.31. VBG while on Bipap- pH 7.37, pCO2 43, pO2 51, bicarb 24.9 Respiratory panel negative Chest xray negative.  They were initially treated with albuterol nebulizers, Bipap, fentanyl, duoneb, solumedrol, levaquin, magnesium.   Patient was admitted to medicine service for further workup and management of acute on chronic hypoxic respiratory failure as outlined in detail below.  08/12/21 -stable, improved  Assessment & Plan  Principal Problem:   Acute exacerbation of chronic obstructive pulmonary disease (COPD) (HCC)  Acute on chronic hypoxic respiratory failure  COPD on 4L [place] chronically- viral panel negative. Chest xray negative acute infiltrates. Suspect exacerbation of COPD with allergies or viral. Patient able to wean from bipap this am. Currently on her home 4L while at rest.  - ambulate with pulse ox - continue IV Abx - continue duonebs - continue IV steroids - recommend observation overnight - loratadine daily - f/u sputum culture  Type II DM  HLD - sSSI - continue home medications  RLS - continue home medications  hypothyroidism - continue home medications  Depression  anxiety- - continue home  medications  Body mass index is 20.51 kg/m.  VTE ppx: enoxaparin (LOVENOX) injection 40 mg Start: 08/10/21 2200   Diet:     Diet   Diet regular Room service appropriate? Yes; Fluid consistency: Thin   Consultants: None  Subjective 08/12/21    Pt reports wanting to take off Bipap. Endorses improvement in her breathing.  She also states that she is hungry.   Objective   Vitals:   08/11/21 2024 08/11/21 2337 08/12/21 0353 08/12/21 0440  BP: (!) 136/49 121/62 140/61   Pulse: 86 70 70   Resp: '20 20 20   '$ Temp: 98.1 F (36.7 C) 98.7 F (37.1 C) 98.2 F (36.8 C)   TempSrc:      SpO2: 98% 99% 100%   Weight:    54.2 kg  Height:        Intake/Output Summary (Last 24 hours) at 08/12/2021 0730 Last data filed at 08/11/2021 1500 Gross per 24 hour  Intake --  Output 375 ml  Net -375 ml    Filed Weights   08/10/21 1524 08/12/21 0440  Weight: 53 kg 54.2 kg     Physical Exam:  General: awake, alert, NAD HEENT: atraumatic, clear conjunctiva, anicteric sclera, MMM, hearing grossly normal Respiratory: normal respiratory effort. Positive rhonchi Cardiovascular: quick capillary refill, normal S1/S2, RRR, no JVD, murmurs Nervous: A&O x3. no gross focal neurologic deficits Extremities: moves all equally, trace LE edema, normal tone Skin: dry, intact, normal temperature, normal color. No rashes, lesions or ulcers on exposed skin Psychiatry: normal mood, congruent affect  Labs  I have personally reviewed the following labs and imaging studies CBC    Component Value Date/Time   WBC 10.3 08/12/2021 0412   RBC 3.26 (L) 08/12/2021 0412   HGB 9.7 (L) 08/12/2021 0412   HGB 11.5 (L) 06/07/2013 0403   HCT 31.0 (L) 08/12/2021 0412   HCT 33.7 (L) 06/07/2013 0403   PLT 243 08/12/2021 0412   PLT 149 (L) 06/07/2013 0403   MCV 95.1 08/12/2021 0412   MCV 98 06/07/2013 0403   MCH 29.8 08/12/2021 0412   MCHC 31.3 08/12/2021 0412   RDW 13.0 08/12/2021 0412   RDW 13.4 06/07/2013 0403    LYMPHSABS 3.0 08/10/2021 1520   LYMPHSABS 0.9 (L) 06/07/2013 0403   MONOABS 1.1 (H) 08/10/2021 1520   MONOABS 0.3 06/07/2013 0403   EOSABS 0.0 08/10/2021 1520   EOSABS 0.0 06/07/2013 0403   BASOSABS 0.1 08/10/2021 1520   BASOSABS 0.0 06/07/2013 0403      Latest Ref Rng & Units 08/12/2021    4:12 AM 08/11/2021    4:03 AM 08/10/2021    3:20 PM  BMP  Glucose 70 - 99 mg/dL 147   175   117    BUN 8 - 23 mg/dL '27   19   13    '$ Creatinine 0.44 - 1.00 mg/dL 0.91   0.77   0.80    Sodium 135 - 145 mmol/L 141   139   140    Potassium 3.5 - 5.1 mmol/L 3.6   3.7   3.3    Chloride 98 - 111 mmol/L 104   106   105    CO2 22 - 32 mmol/L '28   27   26    '$ Calcium 8.9 - 10.3 mg/dL 8.3   8.4   8.7      DG Chest Port 1 View  Result Date: 08/10/2021 CLINICAL DATA:  Short of breath. EXAM: PORTABLE CHEST 1 VIEW COMPARISON:  06/28/2021 and older exams. FINDINGS: Cardiac silhouette is normal in size and configuration. Normal mediastinal and hilar contours. Lungs are hyperexpanded but clear. No pleural effusion or pneumothorax. Skeletal structures are grossly intact. IMPRESSION: No acute cardiopulmonary disease. Electronically Signed   By: Lajean Manes M.D.   On: 08/10/2021 15:33    Disposition Plan & Communication  Patient status: Inpatient  Admitted From: Home Planned disposition location: Home Anticipated discharge date: 5/29 pending respiratory stabilization   Family Communication: none     Author: Richarda Osmond, DO [facility] 08/12/2021, 7:30 AM   Available by Epic secure chat 7AM-7PM. If 7PM-7AM, please contact night-coverage.  [facility] contact information found on [email or web address].

## 2021-08-13 ENCOUNTER — Other Ambulatory Visit: Payer: Self-pay

## 2021-08-20 ENCOUNTER — Encounter: Payer: Self-pay | Admitting: Dermatology

## 2021-08-20 NOTE — Discharge Summary (Signed)
Physician Discharge Summary  Patient: Kristin [name] CHY:[id number] DOB: 03-May-1945   Code Status: Prior Admit date: 08/10/2021 Discharge date: 08/12/2021 Disposition: Home, No home health services recommended PCP: Tracie Harrier, MD  Recommendations for Outpatient Follow-up:  Follow up with PCP within 1-2 weeks Follow up with pulmonology  Discharge Diagnoses:  Principal Problem:   Acute exacerbation of chronic obstructive pulmonary disease (COPD) [facility])  Brief Hospital Course Summary: Kristin [name] is a 76 y.o. female with a PMH significant for COPD with chronic hypoxic respiratory failure on 4L home O2, Type 2 DM, Hyperlipidemia, RLS, Hypothyroidism, Recurrent UTI's Depression and Anxiety.   They presented from home to the ED on 08/10/2021 with acute worsening of SOB x a few days. She was placed on Bipap in the ED.    In the ED, it was found that they had mild tachycardia to 104, tachypnea to 30, increased O2 requirement with Bipap and 30% FiO2. Normothermix and normotensive.  Significant findings included K+ 3.3, Cr 0.8, WBC 11, hgb 10, troponin 27>25>27, BNP 178.4, LA 0.9, D-dimer 1.31. VBG while on Bipap- pH 7.37, pCO2 43, pO2 51, bicarb 24.9 Respiratory panel negative Chest xray negative.   They were initially treated with albuterol nebulizers, Bipap, fentanyl, duoneb, solumedrol, levaquin, magnesium.  Pulmonology was consulted for further evaluation.   She quickly was able to wean back to her home oxygen requirement of 4L even with ambulation. She was discharged with her antibiotic and steroid course in-hand to prevent any hindrance with obtaining her completed courses.   TOC was consulted to assist with transportation assistance as well as home health services and equipment prior to discharge.    Discharge Condition: Good, improved Recommended discharge diet: Regular healthy diet  Consultations: pulmonology  Procedures/Studies: None    Allergies as  of 08/12/2021       Reactions   Codeine Nausea Only   Can take along with medication for nausea   Morphine And Related Nausea Only   Can take along with medication for nausea   Prednisone Other (See Comments)   Elevated BGL into the 300's        Medication List     STOP taking these medications    guaiFENesin 600 MG 12 hr tablet Commonly known as: MUCINEX       TAKE these medications    acetaminophen 325 MG tablet Commonly known as: TYLENOL Take 2 tablets (650 mg total) by mouth every 6 (six) hours as needed for mild pain (or Fever >/= 101).   alendronate 70 MG tablet Commonly known as: FOSAMAX Take 70 mg by mouth once a week.   amLODipine 5 MG tablet Commonly known as: NORVASC Take 5 mg by mouth daily.   atorvastatin 40 MG tablet Commonly known as: LIPITOR Take 40 mg by mouth daily.   citalopram 10 MG tablet Commonly known as: CELEXA Take 10 mg by mouth daily.   clonazePAM 1 MG tablet Commonly known as: KLONOPIN Take 1 mg by mouth in the morning. What changed: Another medication with the same name was removed. Continue taking this medication, and follow the directions you see here.   fenofibrate 160 MG tablet Take 160 mg by mouth daily.   hydrALAZINE 25 MG tablet Commonly known as: APRESOLINE Take 25 mg by mouth in the morning and at bedtime. What changed: Another medication with the same name was removed. Continue taking this medication, and follow the directions you see here.   levothyroxine 112 MCG tablet Commonly known  as: SYNTHROID Take 112 mcg by mouth daily before breakfast.   losartan 100 MG tablet Commonly known as: COZAAR Take 100 mg by mouth daily.   meclizine 25 MG tablet Commonly known as: ANTIVERT Take 25 mg by mouth 3 (three) times daily as needed for dizziness.   metFORMIN 500 MG tablet Commonly known as: GLUCOPHAGE Take 500 mg by mouth 2 (two) times daily with a meal.   montelukast 10 MG tablet Commonly known as:  SINGULAIR Take 10 mg by mouth at bedtime.   OXYGEN Inhale 4 L/min into the lungs continuous.   pantoprazole 40 MG tablet Commonly known as: PROTONIX Take 1 tablet by mouth 2 (two) times daily with breakfast and lunch.   Potassium Gluconate 2.5 MEQ Tabs Take 99 mg by mouth at bedtime.   pramipexole 0.5 MG tablet Commonly known as: MIRAPEX Take 0.5 mg by mouth in the morning and at bedtime.   Spiriva Respimat 2.5 MCG/ACT Aers Generic drug: Tiotropium Bromide Monohydrate Inhale 2 puffs into the lungs daily at 12 noon.   traMADol 50 MG tablet Commonly known as: ULTRAM Take 50 mg by mouth daily as needed for pain.   traZODone 150 MG tablet Commonly known as: DESYREL Take 150 mg by mouth at bedtime.   Trelegy Ellipta 100-62.5-25 MCG/ACT Aepb Generic drug: Fluticasone-Umeclidin-Vilant Inhale 1 puff into the lungs daily.   Ventolin HFA 108 (90 Base) MCG/ACT inhaler Generic drug: albuterol Inhale 2 puffs into the lungs every 3 (three) hours as needed for wheezing or shortness of breath.   albuterol (2.5 MG/3ML) 0.083% nebulizer solution Commonly known as: PROVENTIL Take 2.5 mg by nebulization every 6 (six) hours.   vitamin B-12 1000 MCG tablet Commonly known as: CYANOCOBALAMIN Take 1,000 mcg by mouth daily.   Vitamin D3 25 MCG tablet Commonly known as: Vitamin D Take 1,000 Units by mouth daily.       ASK your doctor about these medications    levofloxacin 750 MG tablet Commonly known as: Levaquin Take 1 tablet (750 mg total) by mouth daily for 5 days. Ask about: Should I take this medication?   predniSONE 20 MG tablet Commonly known as: DELTASONE Take 2 tablets (40 mg total) by mouth daily for 5 days. Ask about: Should I take this medication?         Subjective   Pt reports feeling back to her baseline. Endorses difficulty with transportation to her outpatient appointments. Her family members are able to check in on her and stay with her.  Objective   Blood pressure (!) 138/51, pulse 76, temperature 99 F (37.2 C), temperature source Oral, resp. rate 20, height '5\' 4"'$  (1.626 m), weight 54.2 kg, SpO2 97 %.   General: Pt is alert, awake, not in acute distress Cardiovascular: RRR, S1/S2 +, no rubs, no gallops Respiratory: CTA bilaterally, no wheezing, no rhonchi Abdominal: Soft, NT, ND, bowel sounds + Extremities: no edema, no cyanosis   The results of significant diagnostics from this hospitalization (including imaging, microbiology, ancillary and laboratory) are listed below for reference.   Imaging studies: DG Chest Port 1 View  Result Date: 08/10/2021 CLINICAL DATA:  Short of breath. EXAM: PORTABLE CHEST 1 VIEW COMPARISON:  06/28/2021 and older exams. FINDINGS: Cardiac silhouette is normal in size and configuration. Normal mediastinal and hilar contours. Lungs are hyperexpanded but clear. No pleural effusion or pneumothorax. Skeletal structures are grossly intact. IMPRESSION: No acute cardiopulmonary disease. Electronically Signed   By: Lajean Manes M.D.   On: 08/10/2021 15:33  Labs: Basic Metabolic Panel:    Latest Ref Rng & Units 08/12/2021    4:12 AM 08/11/2021    4:03 AM 08/10/2021    3:20 PM  BMP  Glucose 70 - 99 mg/dL 147   175   117    BUN 8 - 23 mg/dL '27   19   13    '$ Creatinine 0.44 - 1.00 mg/dL 0.91   0.77   0.80    Sodium 135 - 145 mmol/L 141   139   140    Potassium 3.5 - 5.1 mmol/L 3.6   3.7   3.3    Chloride 98 - 111 mmol/L 104   106   105    CO2 22 - 32 mmol/L '28   27   26    '$ Calcium 8.9 - 10.3 mg/dL 8.3   8.4   8.7     CBC:    Component Value Date/Time   WBC 10.3 08/12/2021 0412   RBC 3.26 (L) 08/12/2021 0412   HGB 9.7 (L) 08/12/2021 0412   HGB 11.5 (L) 06/07/2013 0403   HCT 31.0 (L) 08/12/2021 0412   HCT 33.7 (L) 06/07/2013 0403   PLT 243 08/12/2021 0412   PLT 149 (L) 06/07/2013 0403   MCV 95.1 08/12/2021 0412   MCV 98 06/07/2013 0403   MCH 29.8 08/12/2021 0412   MCHC 31.3 08/12/2021 0412   RDW  13.0 08/12/2021 0412   RDW 13.4 06/07/2013 0403   LYMPHSABS 3.0 08/10/2021 1520   LYMPHSABS 0.9 (L) 06/07/2013 0403   MONOABS 1.1 (H) 08/10/2021 1520   MONOABS 0.3 06/07/2013 0403   EOSABS 0.0 08/10/2021 1520   EOSABS 0.0 06/07/2013 0403   BASOSABS 0.1 08/10/2021 1520   BASOSABS 0.0 06/07/2013 0403    Microbiology: Results for orders placed or performed during the hospital encounter of 08/10/21  Resp Panel by RT-PCR (Flu A&B, Covid) Anterior Nasal Swab     Status: None   Collection Time: 08/10/21  3:29 PM   Specimen: Anterior Nasal Swab  Result Value Ref Range Status   SARS Coronavirus 2 by RT PCR NEGATIVE NEGATIVE Final    Comment: (NOTE) SARS-CoV-2 target nucleic acids are NOT DETECTED.  The SARS-CoV-2 RNA is generally detectable in upper respiratory specimens during the acute phase of infection. The lowest concentration of SARS-CoV-2 viral copies this assay can detect is 138 copies/mL. A negative result does not preclude SARS-Cov-2 infection and should not be used as the sole basis for treatment or other patient management decisions. A negative result may occur with  improper specimen collection/handling, submission of specimen other than nasopharyngeal swab, presence of viral mutation(s) within the areas targeted by this assay, and inadequate number of viral copies(<138 copies/mL). A negative result must be combined with clinical observations, patient history, and epidemiological information. The expected result is Negative.  Fact Sheet for Patients:  EntrepreneurPulse.com.au  Fact Sheet for Healthcare Providers:  IncredibleEmployment.be  This test is no t yet approved or cleared by the [place] FDA and  has been authorized for detection and/or diagnosis of SARS-CoV-2 by FDA under an Emergency Use Authorization (EUA). This EUA will remain  in effect (meaning this test can be used) for the duration of the COVID-19 declaration  under Section 564(b)(1) of the Act, 21 U.S.C.section 360bbb-3(b)(1), unless the authorization is terminated  or revoked sooner.       Influenza A by PCR NEGATIVE NEGATIVE Final   Influenza B by PCR NEGATIVE NEGATIVE Final  Comment: (NOTE) The Xpert Xpress SARS-CoV-2/FLU/RSV plus assay is intended as an aid in the diagnosis of influenza from Nasopharyngeal swab specimens and should not be used as a sole basis for treatment. Nasal washings and aspirates are unacceptable for Xpert Xpress SARS-CoV-2/FLU/RSV testing.  Fact Sheet for Patients: EntrepreneurPulse.com.au  Fact Sheet for Healthcare Providers: IncredibleEmployment.be  This test is not yet approved or cleared by the [place] FDA and has been authorized for detection and/or diagnosis of SARS-CoV-2 by FDA under an Emergency Use Authorization (EUA). This EUA will remain in effect (meaning this test can be used) for the duration of the COVID-19 declaration under Section 564(b)(1) of the Act, 21 U.S.C. section 360bbb-3(b)(1), unless the authorization is terminated or revoked.  Performed at [facility], [place], [place] [address]   Respiratory (~20 pathogens) panel by PCR     Status: None   Collection Time: 08/10/21  8:00 PM   Specimen: Nasopharyngeal Swab; Respiratory  Result Value Ref Range Status   Adenovirus NOT DETECTED NOT DETECTED Final   Coronavirus 229E NOT DETECTED NOT DETECTED Final    Comment: (NOTE) The Coronavirus on the Respiratory Panel, DOES NOT test for the novel  Coronavirus (2019 nCoV)    Coronavirus HKU1 NOT DETECTED NOT DETECTED Final   Coronavirus NL63 NOT DETECTED NOT DETECTED Final   Coronavirus OC43 NOT DETECTED NOT DETECTED Final   Metapneumovirus NOT DETECTED NOT DETECTED Final   Rhinovirus / Enterovirus NOT DETECTED NOT DETECTED Final   Influenza A NOT DETECTED NOT DETECTED Final   Influenza B NOT DETECTED NOT DETECTED  Final   Parainfluenza Virus 1 NOT DETECTED NOT DETECTED Final   Parainfluenza Virus 2 NOT DETECTED NOT DETECTED Final   Parainfluenza Virus 3 NOT DETECTED NOT DETECTED Final   Parainfluenza Virus 4 NOT DETECTED NOT DETECTED Final   Respiratory Syncytial Virus NOT DETECTED NOT DETECTED Final   Bordetella pertussis NOT DETECTED NOT DETECTED Final   Bordetella Parapertussis NOT DETECTED NOT DETECTED Final   Chlamydophila pneumoniae NOT DETECTED NOT DETECTED Final   Mycoplasma pneumoniae NOT DETECTED NOT DETECTED Final    Comment: Performed at [facility], [place] [address] [place], [place] [address]    Time coordinating discharge: Over 30 minutes  Richarda Osmond, MD  [facility] 08/20/2021, 11:07 AM

## 2021-08-27 ENCOUNTER — Telehealth: Payer: Self-pay | Admitting: Nurse Practitioner

## 2021-08-27 NOTE — Telephone Encounter (Signed)
Spoke with patient's husband Kristin [name] regarding the Palliative referral/services and all questions were answered and he was in agreement with beginning services.  I have scheduled a MyChart Palliative Consult for 08/29/21 @ 1 PM

## 2021-08-29 ENCOUNTER — Encounter: Payer: Self-pay | Admitting: Nurse Practitioner

## 2021-08-29 ENCOUNTER — Telehealth: Payer: Medicare Other | Admitting: Nurse Practitioner

## 2021-08-29 DIAGNOSIS — R110 Nausea: Secondary | ICD-10-CM

## 2021-08-29 DIAGNOSIS — R0602 Shortness of breath: Secondary | ICD-10-CM

## 2021-08-29 DIAGNOSIS — Z515 Encounter for palliative care: Secondary | ICD-10-CM

## 2021-08-29 DIAGNOSIS — R531 Weakness: Secondary | ICD-10-CM

## 2021-08-29 DIAGNOSIS — R630 Anorexia: Secondary | ICD-10-CM

## 2021-08-29 DIAGNOSIS — J449 Chronic obstructive pulmonary disease, unspecified: Secondary | ICD-10-CM

## 2021-08-29 DIAGNOSIS — R197 Diarrhea, unspecified: Secondary | ICD-10-CM

## 2021-08-29 NOTE — Progress Notes (Addendum)
[facility] Palliative Care Consult Note Telephone: [phone]  Fax: [phone]   Date of encounter: 08/29/21 2:27 PM PATIENT NAME: Kristin [name] [address] [place] [address]   [phone] (home)  DOB: [birth year] MRN: [id number] PRIMARY CARE PROVIDER:    Tracie Harrier, MD,  [address] [place] [address] [place] PROVIDER:   Tracie [name], [place],  [place] [address] [phone]  RESPONSIBLE PARTY:    Contact Information     Name Relation Home Work Mobile   Kristin [name], [name] [phone]   [name]   [phone]       Due to the COVID-19 crisis, this visit was done via telemedicine from my office and it was initiated and consent by this patient and or family.  I connected with Kristin [name] with  Kristin [name] OR [facility]OX[facility] on 08/29/21 by telephone as video not available enabled telemedicine application and verified that I am speaking with the correct person using two identifiers.   I discussed the limitations of evaluation and management by telemedicine. The patient expressed understanding and agreed to proceed.  Palliative Care was asked to follow this patient by consultation request of  Kristin Harrier, MD to address advance care planning and complex medical decision making. This is the initial visit.                           ASSESSMENT AND PLAN / RECOMMENDATIONS:  Symptom Management/Plan: 1. Advance Care Planning;  ongoing discussions  2. Goals of Care: Goals include to maximize quality of life and symptom management. Our advance care planning conversation included a discussion about:    The value and importance of advance care planning  Exploration of personal, cultural or spiritual beliefs that might influence medical decisions  Exploration of goals of care in the event  of a sudden injury or illness  Identification and preparation of a healthcare agent  Review and updating or creation of an advance directive document.  3. Generalized weakness secondary to decompensation with Nausea/Diarrhea with concern for dehydration; discussed at length increase oral fluids as able, supplements, using zofran. We talked about Kristin. [name] requires assistance with ambulating, adl's, very weak. Recommended to go to ED due to being symptomatic with clinical description from Kristin [name]. Kristin. [name] endorses she wishes to stay home, trying to oral hydrate. Talked about weakness with fall precautions. Continue on flagyl  4. Anorexia secondary to decompensation, discussed nutrition, education done, bland diet with supplements. Continue increasing protein in diet, megace.Sore mouth resolved with [facility] magic mouth wash.   03/06/2021 weight 129 lbs 05/29/2021 weight 129 lbs 06/26/2021 weight 122.6 lbs 08/22/2021 weight 111 lbs 11 lbs loss/3 months; 13.95%  5. Shortness of breath secondary to COPD, exhaustion, we talked about energy conservation, using inhalation therapy, supplemental O2, discussed at length concern for overall decline and recommendation to go to ED. Kristin. [name] endorses will continue to monitor symptoms. Will see if PC RN can make a visit within a week for further discussions of goc.   6. Palliative care encounter; Palliative care encounter; Palliative medicine team will continue to support patient, patient's family, and medical team. Visit consisted of counseling and education dealing with the complex and emotionally intense issues of symptom management and palliative care in the setting of serious and potentially life-threatening illness  Follow up Palliative Care  Visit: Palliative care will continue to follow for complex medical decision making, advance care planning, and clarification of goals. Return 1 weeks for [facility] RN, then 4 weeks for [facility] NP or prn.  I spent 45  minutes providing this consultation. More than 50% of the time in this consultation was spent in counseling and care coordination. PPS: 40% Chief Complaint: Initial palliative consult for complex medical decision making, address goals, manage ongoing symptoms HISTORY OF PRESENT ILLNESS:  Kristin [name] is a 76 y.o. 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 Recently hospitalized 08/10/2021 to 08/12/2021 for shortness of breath secondary to acute exacerbation of COPD requiring bipap. She was stabilized and d/c home with her husband with whom she resides with. No home health services were recommended. Kristin. [name] had a f/u visit with Kristin [name] 08/22/2021 Changed citalopram to zoloft, megace, lidocaine patches for chronic back pain, magic mouthwash for sore mouth. I called Kristin [name] by telephone as video not available for initial palliative consult. We talked about purpose of PC visit. We talked about past medical history, last time Kristin. [name] was independent. We talked about social, family history, support system. We talked about ros, symptoms, plan as above. We talked about concern for dehydration with worsening symptoms with recommendation to go to ED for evaluation. Kristin. [name] endorses he will continue to monitor Kristin. [name]. We talked about medical goals of care. We talked about chronic disease progression, Kristin. [name] was short of breath talking on the phone answering questions, Kristin. [name] answered most of the questions. Supportive visit, manage symptoms as above discussed. Discussed f/u pc visit, in agreement, scheduled. Therapeutic listening, emotional support provided. Questions answered. Contact information provided.   History obtained from review of EMR, discussion with primary team, and interview with family, facility  staff/caregiver and/or Kristin. [name].  I reviewed available labs, medications, imaging, studies and related documents from the EMR.  Records reviewed and summarized above.   ROS 10 point system reviewed all negative except anorexia, diarrhea, nausea, weakness, shortness of breath   Physical Exam: deferred CURRENT PROBLEM LIST:  Patient Active Problem List   Diagnosis Date Noted   Respiratory distress 05/05/2021   COPD exacerbation ([place]) 05/05/2021   Shortness of breath 05/04/2021   Acute exacerbation of chronic obstructive pulmonary disease (COPD) ([place]) 02/22/2020   RLQ abdominal pain 07/22/2019   Acute respiratory failure due to COVID-19 ([place]) 05/30/2019   Carotid stenosis 10/26/2017   Pyelonephritis 09/02/2017   UTI (urinary tract infection) 11/30/2016   Encephalopathy acute 11/30/2016   Chronic respiratory failure with hypoxia ([place]) 11/30/2016   Weakness generalized 11/30/2016   Acute encephalopathy 11/30/2016   Acute respiratory failure ([place]) 05/23/2016   Malnutrition of moderate degree 05/23/2016   Syncope 10/23/2015   Major depression single episode, in partial remission ([place]) 09/07/2015   Hypothyroidism, unspecified 06/02/2014   Benign essential hypertension 06/02/2014   Gastroesophageal reflux disease without esophagitis 11/23/2013   Diabetes mellitus type 2, uncomplicated ([place]) 85/27/7824   PAST MEDICAL HISTORY:  Active Ambulatory Problems    Diagnosis Date Noted   Syncope 10/23/2015   Acute respiratory failure ([place]) 05/23/2016   Malnutrition of moderate degree 05/23/2016   UTI (urinary tract infection) 11/30/2016   Encephalopathy acute 11/30/2016   Chronic respiratory failure with hypoxia ([place]) 11/30/2016   Weakness generalized 11/30/2016   Acute encephalopathy 11/30/2016   Pyelonephritis  09/02/2017   Carotid stenosis 10/26/2017   Acute respiratory failure due to COVID-19 ([place]) 05/30/2019   RLQ abdominal pain 07/22/2019   Acute exacerbation of chronic  obstructive pulmonary disease (COPD) ([place]) 02/22/2020   Gastroesophageal reflux disease without esophagitis 11/23/2013   Hypothyroidism, unspecified 06/02/2014   Major depression single episode, in partial remission ([place]) 09/07/2015   Diabetes mellitus type 2, uncomplicated ([place]) 16/09/3708   Benign essential hypertension 06/02/2014   Shortness of breath 05/04/2021   Respiratory distress 05/05/2021   COPD exacerbation ([place]) 05/05/2021   Resolved Ambulatory Problems    Diagnosis Date Noted   Diabetes ([place]) 10/26/2017   Essential hypertension 10/26/2017   COPD (chronic obstructive pulmonary disease) ([place]) 10/26/2017   COPD exacerbation ([place]) 07/19/2019   Past Medical History:  Diagnosis Date   Asthma    Cancer ([place]) 2010   COVID-19 05/27/2019   Diabetes mellitus without complication ([place])    Emphysema lung ([place])    Hypertension    Stroke ([place])    SOCIAL HX:  Social History   Tobacco Use   Smoking status: Former    Passive exposure: Past   Smokeless tobacco: Never  Substance Use Topics   Alcohol use: No   FAMILY HX:  Family History  Problem Relation Age of Onset   Ovarian cancer Mother    Lung cancer Father    Breast cancer Paternal Aunt       ALLERGIES:  Allergies  Allergen Reactions   Codeine Nausea Only    Can take along with medication for nausea   Morphine And Related Nausea Only    Can take along with medication for nausea   Prednisone Other (See Comments)    Elevated BGL into the 300's     PERTINENT MEDICATIONS:  Outpatient Encounter Medications as of 08/29/2021  Medication Sig   acetaminophen (TYLENOL) 325 MG tablet Take 2 tablets (650 mg total) by mouth every 6 (six) hours as needed for mild pain (or Fever >/= 101).   albuterol (PROVENTIL) (2.5 MG/3ML) 0.083% nebulizer solution Take 2.5 mg by nebulization every 6 (six) hours.   alendronate (FOSAMAX) 70 MG tablet Take 70 mg by mouth once a week.   amLODipine (NORVASC) 5 MG tablet Take 5 mg by mouth  daily.   atorvastatin (LIPITOR) 40 MG tablet Take 40 mg by mouth daily.    citalopram (CELEXA) 10 MG tablet Take 10 mg by mouth daily.   clonazePAM (KLONOPIN) 1 MG tablet Take 1 mg by mouth in the morning.   fenofibrate 160 MG tablet Take 160 mg by mouth daily.    hydrALAZINE (APRESOLINE) 25 MG tablet Take 25 mg by mouth in the morning and at bedtime.   levothyroxine (SYNTHROID, LEVOTHROID) 112 MCG tablet Take 112 mcg by mouth daily before breakfast.    losartan (COZAAR) 100 MG tablet Take 100 mg by mouth daily.   meclizine (ANTIVERT) 25 MG tablet Take 25 mg by mouth 3 (three) times daily as needed for dizziness.   metFORMIN (GLUCOPHAGE) 500 MG tablet Take 500 mg by mouth 2 (two) times daily with a meal.    montelukast (SINGULAIR) 10 MG tablet Take 10 mg by mouth at bedtime.    OXYGEN Inhale 4 L/min into the lungs continuous.    pantoprazole (PROTONIX) 40 MG tablet Take 1 tablet by mouth 2 (two) times daily with breakfast and lunch.   Potassium Gluconate 2.5 MEQ TABS Take 99 mg by mouth at bedtime.   pramipexole (MIRAPEX) 0.5 MG tablet Take 0.5 mg  by mouth in the morning and at bedtime.   SPIRIVA RESPIMAT 2.5 MCG/ACT AERS Inhale 2 puffs into the lungs daily at 12 noon.   traMADol (ULTRAM) 50 MG tablet Take 50 mg by mouth daily as needed for pain.   traZODone (DESYREL) 150 MG tablet Take 150 mg by mouth at bedtime.   TRELEGY ELLIPTA 100-62.5-25 MCG/ACT AEPB Inhale 1 puff into the lungs daily.   VENTOLIN HFA 108 (90 Base) MCG/ACT inhaler Inhale 2 puffs into the lungs every 3 (three) hours as needed for wheezing or shortness of breath.    vitamin B-12 (CYANOCOBALAMIN) 1000 MCG tablet Take 1,000 mcg by mouth daily.   Vitamin D3 (VITAMIN D) 25 MCG tablet Take 1,000 Units by mouth daily.   No facility-administered encounter medications on file as of 08/29/2021.   Thank you for the opportunity to participate in the care of Kristin. Spoelstra.  The palliative care team will continue to follow. Please call  our office at [phone] if we can be of additional assistance.   Jonte Shiller Z Gesselle Fitzsimons, NP ,

## 2021-08-29 NOTE — Addendum Note (Signed)
Addended by: Shawn Stall on: 08/29/2021 03:33 PM   Modules accepted: Level of Service

## 2021-09-04 ENCOUNTER — Other Ambulatory Visit: Payer: Medicare Other

## 2021-09-04 VITALS — BP 150/50 | HR 93 | Temp 97.7°F | Resp 22

## 2021-09-04 DIAGNOSIS — Z515 Encounter for palliative care: Secondary | ICD-10-CM

## 2021-09-04 NOTE — Progress Notes (Addendum)
PATIENT NAME: Kristin [name] DOB: 1945-12-28 MRN: [id number]  PRIMARY CARE PROVIDER: Tracie Harrier, MD  RESPONSIBLE PARTY:  Acct ID - Guarantor Home Phone Work Phone Relationship Acct Type  [id number] LAQUITTA, [id number]  Self P/F     [place], [place], [place] [address]    In person visit completed with patient, spouse [name] and daughter-in-law.  Son [name] connected by phone.  ACP:  Discussion completed on ACP.  Patient has AD in her possession and is currently talking with her son [name] about her wishes.  Discussed code status and patient is considering DNR status.  She would like to talk to her son more about this.  Blank MOST form left in the home for patient to review.  Also left [employer] from [place] with patient.   Nausea:  Patient continues to have issues with nausea that occurs mostly when she eats.  She has zofran in place and is using this frequently.  Daughter-in-law will be calling for a refill today.  Notified [name], NP of nausea.   Oxygen:  [name]-spouse advises on last pulmonology visit, provider increased oxygen liter to 6 L with activity.  They have been unable to secure at 10 L concentrator for patient through [place].  Advised I would follow up with [facility] and provider.  Spoke with Coralyn Mark at [facility] and she advised last conversation with [name] at Dr. Joanell Rising office oxygen could remain at 5 L via [place].  Phone call made to Dr. Joanell Rising office and spoke with [facility] and she advised that the clinical team is going to call the patient/spouse to advise them of no change in the liter flow.   Resources:  Son is asking for food assistance for patient.  Recently food stamps were decreased to $25 per month.  We discussed [facility]  having a food pantry that they could use once a month.  Meals of wheels was looked at but the waiting list was up to 1 year for [facility].  Also, discussed [employer].  I will  have PC SW follow up with son and making referrals for meal assistance.   Patient is using an [facility] to get to appointments.  Getting out of the home has become more challenging.  We discussed [place] who sees patients in their home.  Daughter-in-law and son to contact agency for more information.   HISTORY OF PRESENT ILLNESS:  76 year old female with COPD.  Patient is being followed by Palliative Care every 4-8 weeks and PRN.  CODE STATUS: Full ADVANCED DIRECTIVES: No MOST FORM: No PPS: 40%   PHYSICAL EXAM:   VITALS: Today's Vitals   09/04/21 1412  BP: (!) 150/50  Pulse: 93  Resp: (!) 22  Temp: 97.7 F (36.5 C)  SpO2: 97%    LUNGS: decreased breath sounds CARDIAC: Cor RRR} EXTREMITIES: negative SKIN: Skin color, texture, turgor normal. No rashes or lesions or mobility and turgor normal  NEURO: positive for gait problems and weakness       Lorenza Burton, RN

## 2021-09-05 ENCOUNTER — Other Ambulatory Visit: Payer: Self-pay

## 2021-09-06 ENCOUNTER — Telehealth: Payer: Self-pay

## 2021-09-06 NOTE — Telephone Encounter (Signed)
(  11:20 AM) PC SW outreached patients son, Jorja Loa, to discuss food resources.   Call unsuccessful. SW unable to LVM.

## 2021-09-06 NOTE — Telephone Encounter (Signed)
(  11:25 am) PC SW outreached patient and spouse to discuss/assess food resource need.  Patient and spouse share that they are currently on the waiting list for [place] [employer] financial assistance program and are curre\ntly paying $7/per meal, of which they can not afford on going.   SW discussed [employer] meal delivery as option. Patient ans spouse are interested and would like to move forward with a referral.   SW made referral to [facility] [employer] [facility] for patient.

## 2021-09-19 ENCOUNTER — Other Ambulatory Visit: Payer: Medicare Other | Admitting: Nurse Practitioner

## 2021-09-19 ENCOUNTER — Encounter: Payer: Self-pay | Admitting: Nurse Practitioner

## 2021-09-19 DIAGNOSIS — R531 Weakness: Secondary | ICD-10-CM

## 2021-09-19 DIAGNOSIS — R0602 Shortness of breath: Secondary | ICD-10-CM

## 2021-09-19 DIAGNOSIS — R110 Nausea: Secondary | ICD-10-CM

## 2021-09-19 DIAGNOSIS — J449 Chronic obstructive pulmonary disease, unspecified: Secondary | ICD-10-CM

## 2021-09-19 DIAGNOSIS — K5900 Constipation, unspecified: Secondary | ICD-10-CM

## 2021-09-19 DIAGNOSIS — Z515 Encounter for palliative care: Secondary | ICD-10-CM

## 2021-09-19 NOTE — Progress Notes (Signed)
[facility] Palliative Care Consult Note Telephone: [phone]  Fax: [phone]    Date of encounter: 09/19/21 1:13 PM PATIENT NAME: Kristin [name] [address] [place] [address]   [phone] (home)  DOB: 1945-07-04 MRN: [id number] PRIMARY CARE PROVIDER:    Tracie Harrier, MD,  [address] [facility] [place] [address] [phone]  RESPONSIBLE PARTY:    Contact Information     Name Relation Home Work [place]   [place], [place] [phone]   [name]   [phone]      I met face to face with patient and family in home. Palliative Care was asked to follow this patient by consultation request of  Tracie Harrier, MD to address advance care planning and complex medical decision making. This is a follow up visit.                                  ASSESSMENT AND PLAN / RECOMMENDATIONS:  Advance Care Planning/Goals of Care: Goals include to maximize quality of life and symptom management. Patient/health care surrogate gave his/her permission to discuss. Our advance care planning conversation included a discussion about:    The value and importance of advance care planning  Experiences with loved ones who have been seriously ill or have died  Exploration of personal, cultural or spiritual beliefs that might influence medical decisions  Exploration of goals of care in the event of a sudden injury or illness  Identification of a healthcare agent  Review and updating or creation of an  advance directive document . Decision not to resuscitate or to de-escalate disease focused treatments due to poor prognosis. CODE STATUS: wishes are for DNR; will further discuss amongst family prior to changing to DNR  Symptom Management/Plan: ACP: wishes are for DNR; will further discuss amongst family prior to changing to DNR; Discussed and reviewed MOST form, blank copy left in  the home, Ms. Braddy endorses she will further discuss with family though she would like to minimize hospitalizations, avoid icu admissions and prefers not to be intubated.   2. Goals of Care: Goals include to maximize quality of life and symptom management. Our advance care planning conversation included a discussion about:    The value and importance of advance care planning  Exploration of personal, cultural or spiritual beliefs that might influence medical decisions  Exploration of goals of care in the event of a sudden injury or illness  Identification and preparation of a healthcare agent  Review and updating or creation of an advance directive document.  3. Generalized weakness secondary to decompensation with Nausea; we talked at length about overall decline, debility, disease progression; discussed hospice vs home health with therapy. We talked about what hospice services provide. Mr and Mrs Rease with son [name] all in agreement to proceed with hospice evaluation. Contacted [name] office for order.    4. Anorexia secondary to decompensation, discussed nutrition, education done, bland diet with supplements. Continue increasing protein in diet, megace. 03/06/2021 weight 129 lbs 05/29/2021 weight 129 lbs 06/26/2021 weight 122.6 lbs 08/22/2021 weight 111 lbs 11 lbs loss/3 months; 13.95%  Rx: Zofran 8m take 1 tablet q6hrs as needed nausea; #120; No RF   5. Shortness of breath secondary to COPD, exhaustion, we talked about energy conservation, using inhalation therapy, supplemental O2, discussed at length concern for overall decline, continue inhalation therapy, re-irritated about  O2_0 /[place] though currently on 4L, unable to get a concentrator to go up to 5L. We talked about quality of life, comfort and disease progression.   6. Constipation, discussed bowel regimen, bowel pattern, fluids, fiber, will send in senna plus  Rx: Senna plus 8.6-50 mg take 1 tablet bid; #60; No RF  7. Palliative  care encounter; Palliative care encounter; Palliative medicine team will continue to support patient, patient's family, and medical team. Visit consisted of counseling and education dealing with the complex and emotionally intense issues of symptom management and palliative care in the setting of serious and potentially life-threatening illness  I spent 62 minutes providing this consultation. More than 50% of the time in this consultation was spent in counseling and care coordination. PPS: 40% Chief Complaint: Follow up palliative consult for complex medical decision making, address goals, manage ongoing symptoms  HISTORY OF PRESENT ILLNESS:  Kristin [name] is a 76 y.o. 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 Recently hospitalized 08/10/2021 to 08/12/2021 for shortness of breath secondary to acute exacerbation of COPD requiring bipap. She was stabilized and d/c home with her husband with whom she resides with. No home health services were recommended. Ms. Dave had a f/u visit with [name] 08/22/2021 Changed citalopram to zoloft, megace, lidocaine patches for chronic back pain, magic mouthwash for sore mouth. I visited Mr and Mrs Fickett in their home with son [name] on conference call. We talked about how Ms. Strahm has been feeling. Ms. Campo endorses she is very tired, short of breath ambulating a few feet requiring to sit down. We talked about disease progression, ros, symptoms, O2 _1 , though concentrator does not go above 4L/[place]. We talked about recent visit with Dr Raul Del. We talked about severe weakness, difficulty doing therapy and it exhausts her. We talked about constipation, appetite, supplements. We talked about advance directives including code status. Introduced MOST form, wishes are Ms. Mccardle endorses she will further  discuss with family though she would like to minimize hospitalizations, avoid icu admissions and prefers not to be intubated. We talked about hospice services under Medicare benefit. We talked about what services are provided. Agree to proceed with hospice evaluation. Called [name] office for order, message left. We talked about role pc in poc. Therapeutic listening, emotional support provided. Questions answered.   History obtained from review of EMR, discussion with Mr and Ms. Fusco in person with son, [name] by conference call I reviewed available labs, medications, imaging, studies and related documents from the EMR.  Records reviewed and summarized above.   ROS 10 point system reviewed all negative except HPI  Physical Exam: Constitutional: NAD General: frail appearing, chronically ill, O2 dependent, pale, weak pleasant female EYES: lids intact ENMT: oral mucous membranes moist CV: S1S2, RRR Pulmonary: decreased throughout +increased work of breathing with exertion, +intermit cough Abdomen: soft and non tender MSK: ambulatory with walker Skin: warm and dry Neuro:  + generalized weakness,  no cognitive impairment Psych: non-anxious affect, A and O x 3  Thank you for the opportunity to participate in the care of Ms. Oman.  The palliative care team will continue to follow. Please call our office at [phone] if we can be of additional assistance.   Rayne Loiseau Ihor Gully, NP

## 2021-10-10 ENCOUNTER — Other Ambulatory Visit: Payer: Medicare Other | Admitting: Nurse Practitioner

## 2021-10-14 ENCOUNTER — Ambulatory Visit: Payer: Medicare Other | Admitting: Dermatology

## 2022-03-22 ENCOUNTER — Emergency Department (HOSPITAL_COMMUNITY)
Admission: EM | Admit: 2022-03-22 | Discharge: 2022-03-22 | Disposition: A | Discharge status: Home / Self Care | Payer: Medicare Other | Attending: Emergency Medicine | Admitting: Emergency Medicine

## 2022-03-22 ENCOUNTER — Encounter (HOSPITAL_COMMUNITY): Payer: Self-pay | Admitting: *Deleted

## 2022-03-22 ENCOUNTER — Other Ambulatory Visit: Payer: Self-pay

## 2022-03-22 ENCOUNTER — Emergency Department (HOSPITAL_COMMUNITY): Payer: Medicare Other

## 2022-03-22 DIAGNOSIS — R400 Somnolence: Secondary | ICD-10-CM | POA: Diagnosis present

## 2022-03-22 DIAGNOSIS — R000 Tachycardia, unspecified: Secondary | ICD-10-CM | POA: Diagnosis not present

## 2022-03-22 DIAGNOSIS — J441 Chronic obstructive pulmonary disease with (acute) exacerbation: Secondary | ICD-10-CM | POA: Insufficient documentation

## 2022-03-22 DIAGNOSIS — Z7951 Long term (current) use of inhaled steroids: Secondary | ICD-10-CM | POA: Diagnosis not present

## 2022-03-22 LAB — CBC WITH DIFFERENTIAL/PLATELET
Abs Immature Granulocytes: 0.02 10*3/uL (ref 0.00–0.07)
Basophils Absolute: 0 10*3/uL (ref 0.0–0.1)
Basophils Relative: 1 %
Eosinophils Absolute: 0.2 10*3/uL (ref 0.0–0.5)
Eosinophils Relative: 4 %
HCT: 32.7 % — ABNORMAL LOW (ref 36.0–46.0)
Hemoglobin: 11.1 g/dL — ABNORMAL LOW (ref 12.0–15.0)
Immature Granulocytes: 0 %
Lymphocytes Relative: 32 %
Lymphs Abs: 1.8 10*3/uL (ref 0.7–4.0)
MCH: 31.5 pg (ref 26.0–34.0)
MCHC: 33.9 g/dL (ref 30.0–36.0)
MCV: 92.9 fL (ref 80.0–100.0)
Monocytes Absolute: 0.5 10*3/uL (ref 0.1–1.0)
Monocytes Relative: 8 %
Neutro Abs: 3 10*3/uL (ref 1.7–7.7)
Neutrophils Relative %: 55 %
Platelets: 133 10*3/uL — ABNORMAL LOW (ref 150–400)
RBC: 3.52 MIL/uL — ABNORMAL LOW (ref 3.87–5.11)
RDW: 12.2 % (ref 11.5–15.5)
WBC: 5.6 10*3/uL (ref 4.0–10.5)
nRBC: 0 % (ref 0.0–0.2)

## 2022-03-22 LAB — BASIC METABOLIC PANEL
Anion gap: 6 (ref 5–15)
BUN: 14 mg/dL (ref 8–23)
CO2: 29 mmol/L (ref 22–32)
Calcium: 8.9 mg/dL (ref 8.9–10.3)
Chloride: 107 mmol/L (ref 98–111)
Creatinine, Ser: 0.88 mg/dL (ref 0.44–1.00)
GFR, Estimated: >60 mL/min (ref >60)
Glucose, Bld: 100 mg/dL — ABNORMAL HIGH (ref 70–99)
Potassium: 4.1 mmol/L (ref 3.5–5.1)
Sodium: 142 mmol/L (ref 135–145)

## 2022-03-22 MED ORDER — ALBUTEROL SULFATE HFA 108 (90 BASE) MCG/ACT IN AERS: 2.0000 | INHALATION_SPRAY | Freq: Four times a day (QID) | RESPIRATORY_TRACT | Status: DC

## 2022-03-22 MED ORDER — ACETAMINOPHEN 500 MG PO TABS
1000.0000 mg | ORAL_TABLET | Freq: Once | ORAL | Status: AC
  Administered 2022-03-22: 1000 mg via ORAL
  Filled 2022-03-22: qty 2

## 2022-03-22 NOTE — ED Notes (Signed)
Pt O2 tank replaced with full tank.

## 2022-03-22 NOTE — ED Notes (Signed)
Ptar called 

## 2022-03-22 NOTE — ED Notes (Signed)
Family updated as to patient's status.

## 2022-03-22 NOTE — ED Triage Notes (Signed)
The pt arrived from home where she is a hospice  pt and a dnr  she has a most form   she has copd enstage   4 liters of nasal 02  no sob on nasal cannula   c/o a headache  ems started an iv and gave '500mg'$  of ns

## 2022-03-22 NOTE — ED Notes (Signed)
To x-ray

## 2022-03-22 NOTE — ED Provider Notes (Signed)
[facility] EMERGENCY DEPARTMENT Provider Note   CSN: [id number] Arrival date & time: 03/22/22  1741     History  No chief complaint on file.   Kristin [name] is a 77 y.o. female.  HPI Patient presents from home via EMS.  EMS individuals help with history.  Patient has a history of COPD, advanced, is engaged in hospice care.  Similarly the patient had a evaluation by hospice nurse earlier in the day, but later, just prior to EMS notification the patient was more somnolent than anticipated, per family report, and she was sent here for evaluation.  The patient self denies complaints, is awake, alert, on her typical 4 L via nasal cannula.  EMS reports that this was the case throughout transport.    Home Medications Prior to Admission medications   Medication Sig Start Date End Date Taking? Authorizing Provider  acetaminophen (TYLENOL) 325 MG tablet Take 2 tablets (650 mg total) by mouth every 6 (six) hours as needed for mild pain (or Fever >/= 101). 08/12/21   Richarda Osmond, MD  albuterol (PROVENTIL) (2.5 MG/3ML) 0.083% nebulizer solution Take 2.5 mg by nebulization every 6 (six) hours. 01/23/20   [provider]  alendronate (FOSAMAX) 70 MG tablet Take 70 mg by mouth once a week. 12/16/19   [provider]  amLODipine (NORVASC) 5 MG tablet Take 5 mg by mouth daily. 02/01/21   [provider]  atorvastatin (LIPITOR) 40 MG tablet Take 40 mg by mouth daily.     [provider]  citalopram (CELEXA) 10 MG tablet Take 10 mg by mouth daily. 03/04/21   [provider]  clonazePAM (KLONOPIN) 1 MG tablet Take 1 mg by mouth in the morning.    [provider]  fenofibrate 160 MG tablet Take 160 mg by mouth daily.  10/06/15   [provider]  hydrALAZINE (APRESOLINE) 25 MG tablet Take 25 mg by mouth in the morning and at bedtime. 08/04/21   [provider]  levothyroxine (SYNTHROID, LEVOTHROID) 112 MCG  tablet Take 112 mcg by mouth daily before breakfast.  09/15/15   [provider]  losartan (COZAAR) 100 MG tablet Take 100 mg by mouth daily.    [provider]  meclizine (ANTIVERT) 25 MG tablet Take 25 mg by mouth 3 (three) times daily as needed for dizziness. 03/29/21   [provider]  metFORMIN (GLUCOPHAGE) 500 MG tablet Take 500 mg by mouth 2 (two) times daily with a meal.     [provider]  montelukast (SINGULAIR) 10 MG tablet Take 10 mg by mouth at bedtime.  06/06/17   [provider]  OXYGEN Inhale 4 L/min into the lungs continuous.     [provider]  pantoprazole (PROTONIX) 40 MG tablet Take 1 tablet by mouth 2 (two) times daily with breakfast and lunch. 10/20/15   [provider]  Potassium Gluconate 2.5 MEQ TABS Take 99 mg by mouth at bedtime.    [provider]  pramipexole (MIRAPEX) 0.5 MG tablet Take 0.5 mg by mouth in the morning and at bedtime.    [provider]  SPIRIVA RESPIMAT 2.5 MCG/ACT AERS Inhale 2 puffs into the lungs daily at 12 noon. 05/01/21   [provider]  traMADol (ULTRAM) 50 MG tablet Take 50 mg by mouth daily as needed for pain. 04/27/21   [provider]  traZODone (DESYREL) 150 MG tablet Take 150 mg by mouth at bedtime. 08/14/17   [provider]  TRELEGY ELLIPTA 100-62.5-25 MCG/ACT AEPB Inhale 1 puff into the lungs daily. 07/15/21   [provider]  VENTOLIN HFA 108 (90 Base) MCG/ACT inhaler Inhale 2 puffs into the lungs every 3 (three) hours as needed for wheezing or shortness of breath.  10/11/15   [provider]  vitamin B-12 (CYANOCOBALAMIN) 1000 MCG tablet Take 1,000 mcg by mouth daily.    [provider]  Vitamin D3 (VITAMIN D) 25 MCG tablet Take 1,000 Units by mouth daily.    [provider]      Allergies    Codeine, Morphine and related, and Prednisone    Review of Systems   Review of Systems  All other systems  reviewed and are negative.   Physical Exam Updated Vital Signs BP (!) 168/61 (BP Location: Right Arm)   Pulse 74   Temp 97.9 F (36.6 C) (Oral)   Resp 20   Ht '5\' 4"'$  (1.626 m)   Wt 54.2 kg   SpO2 100%   BMI 20.51 kg/m  Physical Exam Vitals and nursing note reviewed.  Constitutional:      General: She is not in acute distress.    Appearance: She is well-developed. She is ill-appearing. She is not toxic-appearing or diaphoretic.  HENT:     Head: Normocephalic and atraumatic.  Eyes:     Conjunctiva/sclera: Conjunctivae normal.  Cardiovascular:     Rate and Rhythm: Regular rhythm. Tachycardia present.  Pulmonary:     Effort: Pulmonary effort is normal. Tachypnea present.     Breath sounds: Decreased air movement present.  Abdominal:     General: There is no distension.  Skin:    General: Skin is warm and dry.  Neurological:     Mental Status: She is alert and oriented to person, place, and time.     Cranial Nerves: No cranial nerve deficit.  Psychiatric:        Mood and Affect: Mood normal.     ED Results / Procedures / Treatments   Labs (all labs ordered are listed, but only abnormal results are displayed) Labs Reviewed  BASIC METABOLIC PANEL - Abnormal; Notable for the following components:      Result Value   Glucose, Bld 100 (*)    All other components within normal limits  CBC WITH DIFFERENTIAL/PLATELET - Abnormal; Notable for the following components:   RBC 3.52 (*)    Hemoglobin 11.1 (*)    HCT 32.7 (*)    Platelets 133 (*)    All other components within normal limits    EKG None  Radiology DG Chest 2 View  Result Date: 03/22/2022 CLINICAL DATA:  Shortness of breath. EXAM: CHEST - 2 VIEW COMPARISON:  Aug 10, 2021 FINDINGS: The heart size and mediastinal contours are within normal limits. There is marked severity calcification of the aortic arch. Mild curvilinear atelectasis is seen within the left lung base. There is no evidence of a pleural effusion or  pneumothorax. A radiopaque fusion plate and screws are seen overlying the lower spine. IMPRESSION: Mild left basilar atelectasis. Electronically Signed   By: Virgina Norfolk M.D.   On: 03/22/2022 19:07    Procedures Procedures    Medications Ordered in ED Medications  albuterol (VENTOLIN HFA) 108 (90 Base) MCG/ACT inhaler 2 puff (has no administration in time range)  acetaminophen (TYLENOL) tablet 1,000 mg (1,000 mg Oral Given by Other 03/22/22 1924)    ED Course/ Medical Decision Making/ A&P  Medical Decision Making Elderly female with advanced COPD, enrolled in hospice care presents after family members thought she was overly sedated/hypersomnolent at home.  Here the patient is awake, alert, remained so for several hours.  Labs essentially unremarkable, x-ray with some atelectasis, no obvious pneumonia and she is afebrile, not hypotensive, nor with new oxygen requirement.  Patient comfortable with returning home, case discussed with hospice, they are also aware of this will follow the patient when she returns home.  She did receive bronchodilators for additional support, was discharged in stable condition no hospitalization was a consideration.  Amount and/or Complexity of Data Reviewed Independent Historian:     Details: Hospice nurse External Data Reviewed: notes.    Details: COPD efforts Labs: ordered. Decision-making details documented in ED Course. Radiology: ordered. Decision-making details documented in ED Course.  Risk OTC drugs.  Final Clinical Impression(s) / ED Diagnoses Final diagnoses:  COPD exacerbation [facility])    Rx / DC Orders ED Discharge Orders     None         Carmin Muskrat, MD 03/22/22 2034

## 2022-03-22 NOTE — Discharge Instructions (Signed)
Please use your albuterol every 4 hours, then as needed for relief.  Follow-up with your hospice team on Monday and your physician as well.  Return here for concerning changes in your condition.

## 2023-11-16 ENCOUNTER — Inpatient Hospital Stay
Admission: EM | Admit: 2023-11-16 | Discharge: 2023-11-24 | DRG: 193 | Disposition: A | Discharge status: Skilled Nursing Facility | Source: Skilled Nursing Facility

## 2023-11-16 ENCOUNTER — Emergency Department

## 2023-11-16 ENCOUNTER — Other Ambulatory Visit: Payer: Self-pay

## 2023-11-16 ENCOUNTER — Encounter: Payer: Self-pay | Admitting: Family Medicine

## 2023-11-16 DIAGNOSIS — T380X5A Adverse effect of glucocorticoids and synthetic analogues, initial encounter: Secondary | ICD-10-CM | POA: Diagnosis not present

## 2023-11-16 DIAGNOSIS — K219 Gastro-esophageal reflux disease without esophagitis: Secondary | ICD-10-CM | POA: Diagnosis present

## 2023-11-16 DIAGNOSIS — J9621 Acute and chronic respiratory failure with hypoxia: Secondary | ICD-10-CM | POA: Diagnosis present

## 2023-11-16 DIAGNOSIS — E119 Type 2 diabetes mellitus without complications: Secondary | ICD-10-CM

## 2023-11-16 DIAGNOSIS — I444 Left anterior fascicular block: Secondary | ICD-10-CM | POA: Diagnosis present

## 2023-11-16 DIAGNOSIS — J9622 Acute and chronic respiratory failure with hypercapnia: Secondary | ICD-10-CM | POA: Diagnosis present

## 2023-11-16 DIAGNOSIS — Z9049 Acquired absence of other specified parts of digestive tract: Secondary | ICD-10-CM

## 2023-11-16 DIAGNOSIS — D649 Anemia, unspecified: Secondary | ICD-10-CM | POA: Diagnosis present

## 2023-11-16 DIAGNOSIS — J440 Chronic obstructive pulmonary disease with acute lower respiratory infection: Secondary | ICD-10-CM | POA: Diagnosis present

## 2023-11-16 DIAGNOSIS — F32A Depression, unspecified: Secondary | ICD-10-CM | POA: Diagnosis present

## 2023-11-16 DIAGNOSIS — Z79890 Hormone replacement therapy (postmenopausal): Secondary | ICD-10-CM | POA: Diagnosis not present

## 2023-11-16 DIAGNOSIS — A419 Sepsis, unspecified organism: Principal | ICD-10-CM

## 2023-11-16 DIAGNOSIS — R0602 Shortness of breath: Secondary | ICD-10-CM | POA: Diagnosis present

## 2023-11-16 DIAGNOSIS — E039 Hypothyroidism, unspecified: Secondary | ICD-10-CM | POA: Diagnosis present

## 2023-11-16 DIAGNOSIS — J432 Centrilobular emphysema: Secondary | ICD-10-CM | POA: Diagnosis present

## 2023-11-16 DIAGNOSIS — J189 Pneumonia, unspecified organism: Principal | ICD-10-CM | POA: Diagnosis present

## 2023-11-16 DIAGNOSIS — Z7401 Bed confinement status: Secondary | ICD-10-CM | POA: Diagnosis not present

## 2023-11-16 DIAGNOSIS — Z79899 Other long term (current) drug therapy: Secondary | ICD-10-CM | POA: Diagnosis not present

## 2023-11-16 DIAGNOSIS — Z803 Family history of malignant neoplasm of breast: Secondary | ICD-10-CM

## 2023-11-16 DIAGNOSIS — Z885 Allergy status to narcotic agent status: Secondary | ICD-10-CM

## 2023-11-16 DIAGNOSIS — J441 Chronic obstructive pulmonary disease with (acute) exacerbation: Secondary | ICD-10-CM | POA: Diagnosis present

## 2023-11-16 DIAGNOSIS — Z23 Encounter for immunization: Secondary | ICD-10-CM

## 2023-11-16 DIAGNOSIS — Z888 Allergy status to other drugs, medicaments and biological substances status: Secondary | ICD-10-CM

## 2023-11-16 DIAGNOSIS — Z8616 Personal history of COVID-19: Secondary | ICD-10-CM

## 2023-11-16 DIAGNOSIS — J9601 Acute respiratory failure with hypoxia: Secondary | ICD-10-CM | POA: Diagnosis present

## 2023-11-16 DIAGNOSIS — E785 Hyperlipidemia, unspecified: Secondary | ICD-10-CM | POA: Diagnosis present

## 2023-11-16 DIAGNOSIS — Z9981 Dependence on supplemental oxygen: Secondary | ICD-10-CM

## 2023-11-16 DIAGNOSIS — Z7951 Long term (current) use of inhaled steroids: Secondary | ICD-10-CM

## 2023-11-16 DIAGNOSIS — N179 Acute kidney failure, unspecified: Secondary | ICD-10-CM | POA: Diagnosis not present

## 2023-11-16 DIAGNOSIS — Z7983 Long term (current) use of bisphosphonates: Secondary | ICD-10-CM | POA: Diagnosis not present

## 2023-11-16 DIAGNOSIS — Z90710 Acquired absence of both cervix and uterus: Secondary | ICD-10-CM

## 2023-11-16 DIAGNOSIS — Z1152 Encounter for screening for COVID-19: Secondary | ICD-10-CM | POA: Diagnosis not present

## 2023-11-16 DIAGNOSIS — Z7984 Long term (current) use of oral hypoglycemic drugs: Secondary | ICD-10-CM | POA: Diagnosis not present

## 2023-11-16 DIAGNOSIS — R6520 Severe sepsis without septic shock: Secondary | ICD-10-CM | POA: Diagnosis not present

## 2023-11-16 DIAGNOSIS — F3289 Other specified depressive episodes: Secondary | ICD-10-CM | POA: Diagnosis not present

## 2023-11-16 DIAGNOSIS — E1165 Type 2 diabetes mellitus with hyperglycemia: Secondary | ICD-10-CM | POA: Diagnosis present

## 2023-11-16 DIAGNOSIS — I10 Essential (primary) hypertension: Secondary | ICD-10-CM | POA: Diagnosis present

## 2023-11-16 DIAGNOSIS — Z801 Family history of malignant neoplasm of trachea, bronchus and lung: Secondary | ICD-10-CM

## 2023-11-16 DIAGNOSIS — K5900 Constipation, unspecified: Secondary | ICD-10-CM | POA: Diagnosis not present

## 2023-11-16 DIAGNOSIS — Z87891 Personal history of nicotine dependence: Secondary | ICD-10-CM

## 2023-11-16 DIAGNOSIS — Z515 Encounter for palliative care: Secondary | ICD-10-CM | POA: Diagnosis not present

## 2023-11-16 DIAGNOSIS — Z8041 Family history of malignant neoplasm of ovary: Secondary | ICD-10-CM

## 2023-11-16 DIAGNOSIS — Z85828 Personal history of other malignant neoplasm of skin: Secondary | ICD-10-CM

## 2023-11-16 DIAGNOSIS — Z8673 Personal history of transient ischemic attack (TIA), and cerebral infarction without residual deficits: Secondary | ICD-10-CM

## 2023-11-16 LAB — COMPREHENSIVE METABOLIC PANEL WITH GFR
ALT: 8 U/L (ref 0–44)
AST: <10 U/L — ABNORMAL LOW (ref 15–41)
Albumin: 3.3 g/dL — ABNORMAL LOW (ref 3.5–5.0)
Alkaline Phosphatase: 71 U/L (ref 38–126)
Anion gap: 10 (ref 5–15)
BUN: 17 mg/dL (ref 8–23)
CO2: 30 mmol/L (ref 22–32)
Calcium: 9.2 mg/dL (ref 8.9–10.3)
Chloride: 104 mmol/L (ref 98–111)
Creatinine, Ser: 0.7 mg/dL (ref 0.44–1.00)
GFR, Estimated: >60 mL/min (ref >60)
Glucose, Bld: 162 mg/dL — ABNORMAL HIGH (ref 70–99)
Potassium: 3.7 mmol/L (ref 3.5–5.1)
Sodium: 144 mmol/L (ref 135–145)
Total Bilirubin: 0.9 mg/dL (ref 0.0–1.2)
Total Protein: 6.8 g/dL (ref 6.5–8.1)

## 2023-11-16 LAB — BRAIN NATRIURETIC PEPTIDE: B Natriuretic Peptide: 83.1 pg/mL (ref 0.0–100.0)

## 2023-11-16 LAB — BLOOD GAS, VENOUS
Acid-Base Excess: 8.8 mmol/L — ABNORMAL HIGH (ref 0.0–2.0)
Bicarbonate: 37 mmol/L — ABNORMAL HIGH (ref 20.0–28.0)
FIO2: 0.36 %
O2 Saturation: 27 %
Patient temperature: 37
pCO2, Ven: 67 mmHg — ABNORMAL HIGH (ref 44–60)
pH, Ven: 7.35 (ref 7.25–7.43)
pO2, Ven: <31 mmHg — CL (ref 32–45)

## 2023-11-16 LAB — URINALYSIS, W/ REFLEX TO CULTURE (INFECTION SUSPECTED)
Bilirubin Urine: NEGATIVE
Glucose, UA: >=500 mg/dL — AB
Hgb urine dipstick: NEGATIVE
Ketones, ur: NEGATIVE mg/dL
Leukocytes,Ua: NEGATIVE
Nitrite: NEGATIVE
Protein, ur: 30 mg/dL — AB
Specific Gravity, Urine: 1.027 (ref 1.005–1.030)
pH: 5 (ref 5.0–8.0)

## 2023-11-16 LAB — CBC WITH DIFFERENTIAL/PLATELET
Abs Immature Granulocytes: 0.02 K/uL (ref 0.00–0.07)
Basophils Absolute: 0 K/uL (ref 0.0–0.1)
Basophils Relative: 0 %
Eosinophils Absolute: 0 K/uL (ref 0.0–0.5)
Eosinophils Relative: 0 %
HCT: 35.9 % — ABNORMAL LOW (ref 36.0–46.0)
Hemoglobin: 11.1 g/dL — ABNORMAL LOW (ref 12.0–15.0)
Immature Granulocytes: 0 %
Lymphocytes Relative: 12 %
Lymphs Abs: 0.9 K/uL (ref 0.7–4.0)
MCH: 30.2 pg (ref 26.0–34.0)
MCHC: 30.9 g/dL (ref 30.0–36.0)
MCV: 97.6 fL (ref 80.0–100.0)
Monocytes Absolute: 0.5 K/uL (ref 0.1–1.0)
Monocytes Relative: 7 %
Neutro Abs: 6 K/uL (ref 1.7–7.7)
Neutrophils Relative %: 81 %
Platelets: 187 K/uL (ref 150–400)
RBC: 3.68 MIL/uL — ABNORMAL LOW (ref 3.87–5.11)
RDW: 13.4 % (ref 11.5–15.5)
Smear Review: NORMAL
WBC: 7.5 K/uL (ref 4.0–10.5)
nRBC: 0 % (ref 0.0–0.2)

## 2023-11-16 LAB — BLOOD GAS, ARTERIAL
Acid-Base Excess: 6.9 mmol/L — ABNORMAL HIGH (ref 0.0–2.0)
Bicarbonate: 32.4 mmol/L — ABNORMAL HIGH (ref 20.0–28.0)
FIO2: 0.36 %
O2 Saturation: 96.7 %
Patient temperature: 37
pCO2 arterial: 50 mmHg — ABNORMAL HIGH (ref 32–48)
pH, Arterial: 7.42 (ref 7.35–7.45)
pO2, Arterial: 70 mmHg — ABNORMAL LOW (ref 83–108)

## 2023-11-16 LAB — LACTIC ACID, PLASMA: Lactic Acid, Venous: 0.8 mmol/L (ref 0.5–1.9)

## 2023-11-16 LAB — PROTIME-INR
INR: 1.1 (ref 0.8–1.2)
Prothrombin Time: 15.1 s (ref 11.4–15.2)

## 2023-11-16 LAB — RESP PANEL BY RT-PCR (RSV, FLU A&B, COVID)  RVPGX2
Influenza A by PCR: NEGATIVE
Influenza B by PCR: NEGATIVE
Resp Syncytial Virus by PCR: NEGATIVE
SARS Coronavirus 2 by RT PCR: NEGATIVE

## 2023-11-16 LAB — TROPONIN I (HIGH SENSITIVITY)
Troponin I (High Sensitivity): 11 ng/L (ref <18)
Troponin I (High Sensitivity): 11 ng/L (ref <18)

## 2023-11-16 LAB — GLUCOSE, CAPILLARY
Glucose-Capillary: 260 mg/dL — ABNORMAL HIGH (ref 70–99)
Glucose-Capillary: 334 mg/dL — ABNORMAL HIGH (ref 70–99)
Glucose-Capillary: 322 mg/dL — ABNORMAL HIGH (ref 70–99)

## 2023-11-16 MED ORDER — INSULIN ASPART 100 UNIT/ML IJ SOLN
0.0000 [IU] | Freq: Three times a day (TID) | INTRAMUSCULAR | Status: DC
  Administered 2023-11-16: 3 [IU] via SUBCUTANEOUS
  Administered 2023-11-16: 4 [IU] via SUBCUTANEOUS
  Administered 2023-11-17 (×3): 3 [IU] via SUBCUTANEOUS
  Administered 2023-11-18: 2 [IU] via SUBCUTANEOUS
  Administered 2023-11-18 (×2): 1 [IU] via SUBCUTANEOUS
  Administered 2023-11-19: 2 [IU] via SUBCUTANEOUS
  Administered 2023-11-19: 4 [IU] via SUBCUTANEOUS
  Administered 2023-11-20 (×2): 3 [IU] via SUBCUTANEOUS
  Administered 2023-11-21 (×2): 1 [IU] via SUBCUTANEOUS
  Administered 2023-11-22: 4 [IU] via SUBCUTANEOUS
  Administered 2023-11-23: 3 [IU] via SUBCUTANEOUS
  Administered 2023-11-23: 1 [IU] via SUBCUTANEOUS
  Filled 2023-11-16 (×15): qty 1

## 2023-11-16 MED ORDER — SODIUM CHLORIDE 0.9 % IV SOLN
2.0000 g | Freq: Once | INTRAVENOUS | Status: AC
  Administered 2023-11-16: 2 g via INTRAVENOUS
  Filled 2023-11-16: qty 20

## 2023-11-16 MED ORDER — VITAMIN B-12 1000 MCG PO TABS: 1000.0000 ug | ORAL_TABLET | Freq: Every day | ORAL | Status: DC

## 2023-11-16 MED ORDER — ONDANSETRON HCL 4 MG/2ML IJ SOLN
4.0000 mg | INTRAMUSCULAR | Status: AC
  Administered 2023-11-16: 4 mg via INTRAVENOUS
  Filled 2023-11-16: qty 2

## 2023-11-16 MED ORDER — ATORVASTATIN CALCIUM 20 MG PO TABS: 40.0000 mg | ORAL_TABLET | Freq: Every day | ORAL | Status: DC

## 2023-11-16 MED ORDER — ALBUTEROL SULFATE (2.5 MG/3ML) 0.083% IN NEBU
2.5000 mg | INHALATION_SOLUTION | Freq: Four times a day (QID) | RESPIRATORY_TRACT | Status: DC
  Administered 2023-11-16 – 2023-11-17 (×4): 2.5 mg via RESPIRATORY_TRACT
  Filled 2023-11-16 (×4): qty 3

## 2023-11-16 MED ORDER — IPRATROPIUM-ALBUTEROL 0.5-2.5 (3) MG/3ML IN SOLN
3.0000 mL | Freq: Once | RESPIRATORY_TRACT | Status: AC
  Administered 2023-11-16: 3 mL via RESPIRATORY_TRACT
  Filled 2023-11-16: qty 3

## 2023-11-16 MED ORDER — PNEUMOCOCCAL 20-VAL CONJ VACC 0.5 ML IM SUSY
0.5000 mL | PREFILLED_SYRINGE | INTRAMUSCULAR | Status: AC
  Administered 2023-11-22: 0.5 mL via INTRAMUSCULAR
  Filled 2023-11-16 (×2): qty 0.5

## 2023-11-16 MED ORDER — AMLODIPINE BESYLATE 5 MG PO TABS: 5.0000 mg | ORAL_TABLET | Freq: Every day | ORAL | Status: DC

## 2023-11-16 MED ORDER — PANTOPRAZOLE SODIUM 40 MG PO TBEC
40.0000 mg | DELAYED_RELEASE_TABLET | Freq: Two times a day (BID) | ORAL | Status: DC
  Administered 2023-11-16 – 2023-11-24 (×17): 40 mg via ORAL
  Filled 2023-11-16 (×17): qty 1

## 2023-11-16 MED ORDER — GUAIFENESIN ER 600 MG PO TB12
600.0000 mg | ORAL_TABLET | Freq: Two times a day (BID) | ORAL | Status: DC | PRN
  Administered 2023-11-16 – 2023-11-23 (×3): 600 mg via ORAL
  Filled 2023-11-16 (×3): qty 1

## 2023-11-16 MED ORDER — INSULIN ASPART 100 UNIT/ML IJ SOLN
0.0000 [IU] | Freq: Every day | INTRAMUSCULAR | Status: DC
  Administered 2023-11-16: 4 [IU] via SUBCUTANEOUS
  Filled 2023-11-16 (×2): qty 1

## 2023-11-16 MED ORDER — BISACODYL 10 MG RE SUPP
10.0000 mg | RECTAL | Status: DC | PRN
  Administered 2023-11-20: 10 mg via RECTAL
  Filled 2023-11-16: qty 1

## 2023-11-16 MED ORDER — LEVOTHYROXINE SODIUM 112 MCG PO TABS
112.0000 ug | ORAL_TABLET | Freq: Every day | ORAL | Status: DC
  Administered 2023-11-17 – 2023-11-24 (×8): 112 ug via ORAL
  Filled 2023-11-16 (×8): qty 1

## 2023-11-16 MED ORDER — ACETAMINOPHEN 325 MG PO TABS
650.0000 mg | ORAL_TABLET | Freq: Four times a day (QID) | ORAL | Status: DC | PRN
  Administered 2023-11-16 – 2023-11-21 (×4): 650 mg via ORAL
  Filled 2023-11-16 (×4): qty 2

## 2023-11-16 MED ORDER — TRAZODONE HCL 50 MG PO TABS
150.0000 mg | ORAL_TABLET | Freq: Every day | ORAL | Status: DC
  Administered 2023-11-16 – 2023-11-23 (×8): 150 mg via ORAL
  Filled 2023-11-16 (×8): qty 1

## 2023-11-16 MED ORDER — SODIUM CHLORIDE 0.9 % IV SOLN
500.0000 mg | INTRAVENOUS | Status: DC
  Administered 2023-11-17 – 2023-11-21 (×5): 500 mg via INTRAVENOUS
  Filled 2023-11-16 (×5): qty 5

## 2023-11-16 MED ORDER — METHYLPREDNISOLONE SODIUM SUCC 125 MG IJ SOLR
62.5000 mg | Freq: Once | INTRAMUSCULAR | Status: AC
  Administered 2023-11-16: 62.5 mg via INTRAVENOUS
  Filled 2023-11-16: qty 2

## 2023-11-16 MED ORDER — BROMPHENIRAMINE-PHENYLEPHRINE 2-5 MG/10ML PO LIQD: 10.0000 mL | Freq: Four times a day (QID) | ORAL | Status: DC | PRN

## 2023-11-16 MED ORDER — METHYLPREDNISOLONE SODIUM SUCC 40 MG IJ SOLR
40.0000 mg | Freq: Four times a day (QID) | INTRAMUSCULAR | Status: AC
  Administered 2023-11-16 – 2023-11-18 (×8): 40 mg via INTRAVENOUS
  Filled 2023-11-16 (×8): qty 1

## 2023-11-16 MED ORDER — VITAMIN D 25 MCG (1000 UNIT) PO TABS: 1000.0000 [IU] | ORAL_TABLET | Freq: Every day | ORAL | Status: DC

## 2023-11-16 MED ORDER — SODIUM CHLORIDE 0.9 % IV SOLN
500.0000 mg | Freq: Once | INTRAVENOUS | Status: AC
  Administered 2023-11-16: 500 mg via INTRAVENOUS
  Filled 2023-11-16: qty 5

## 2023-11-16 MED ORDER — LACTATED RINGERS IV BOLUS (SEPSIS)
1000.0000 mL | Freq: Once | INTRAVENOUS | Status: AC
  Administered 2023-11-16: 1000 mL via INTRAVENOUS

## 2023-11-16 MED ORDER — SODIUM CHLORIDE 0.9 % IV SOLN
2.0000 g | INTRAVENOUS | Status: DC
  Administered 2023-11-17 – 2023-11-21 (×5): 2 g via INTRAVENOUS
  Filled 2023-11-16 (×5): qty 20

## 2023-11-16 MED ORDER — ONDANSETRON HCL 4 MG/2ML IJ SOLN
4.0000 mg | Freq: Four times a day (QID) | INTRAMUSCULAR | Status: DC | PRN
  Administered 2023-11-17 – 2023-11-22 (×5): 4 mg via INTRAVENOUS
  Filled 2023-11-16 (×5): qty 2

## 2023-11-16 MED ORDER — ENOXAPARIN SODIUM 40 MG/0.4ML IJ SOSY
40.0000 mg | PREFILLED_SYRINGE | INTRAMUSCULAR | Status: DC
  Administered 2023-11-16 – 2023-11-23 (×8): 40 mg via SUBCUTANEOUS
  Filled 2023-11-16 (×8): qty 0.4

## 2023-11-16 MED ORDER — ALBUTEROL SULFATE (2.5 MG/3ML) 0.083% IN NEBU: 2.5000 mg | INHALATION_SOLUTION | Freq: Four times a day (QID) | RESPIRATORY_TRACT | Status: DC

## 2023-11-16 MED ORDER — LOSARTAN POTASSIUM 25 MG PO TABS
25.0000 mg | ORAL_TABLET | Freq: Every day | ORAL | Status: DC
  Administered 2023-11-16 – 2023-11-22 (×7): 25 mg via ORAL
  Filled 2023-11-16 (×7): qty 1

## 2023-11-16 NOTE — Plan of Care (Signed)
  Problem: Coping: Goal: Level of anxiety will decrease Outcome: Progressing   Problem: Elimination: Goal: Will not experience complications related to urinary retention Outcome: Progressing   Problem: Pain Managment: Goal: General experience of comfort will improve and/or be controlled Outcome: Progressing   Problem: Safety: Goal: Ability to remain free from injury will improve Outcome: Progressing

## 2023-11-16 NOTE — Progress Notes (Signed)
 RT assisted with patient transport to room 259. Pt tolerated well.

## 2023-11-16 NOTE — ED Provider Notes (Signed)
 [facility] Provider Note    Event Date/Time   First MD Initiated Contact with Patient 11/16/23 [phone]     (approximate)   History   Shortness of Breath   HPI Kristin [name] is a 78 y.o. female who presents by EMS for evaluation of shortness of breath.  She is ill-appearing upon arrival and gasping for air.  She comes from [facility] and very little information was provided to the paramedics.  Reportedly she was satting in the mid 80s on 4 L of oxygen but it is unclear if she uses supplemental oxygen at baseline.  She is unable to provide much history but confirms shortness of breath and some pain between her shoulder blades.     Physical Exam   Triage Vital Signs: ED Triage Vitals  Encounter Vitals Group     BP 11/16/23 0443 (!) 130/97     Girls Systolic BP Percentile --      Girls Diastolic BP Percentile --      Boys Systolic BP Percentile --      Boys Diastolic BP Percentile --      Pulse Rate 11/16/23 0443 89     Resp 11/16/23 0443 (!) 36     Temp 11/16/23 0443 97.8 F (36.6 C)     Temp Source 11/16/23 0443 Axillary     SpO2 11/16/23 0443 96 %     Weight 11/16/23 0441 59.1 kg (130 lb 4.7 oz)     Height 11/16/23 0441 1.626 m (5' 4)     Head Circumference --      Peak Flow --      Pain Score 11/16/23 0441 5     Pain Loc --      Pain Education --      Exclude from Growth Chart --     Most recent vital signs: Vitals:   11/16/23 0535 11/16/23 0600  BP:  (!) 120/42  Pulse: 76 79  Resp: (!) 25 (!) 33  Temp:    SpO2: 100% 100%    General: Awake and alert but toxic appearance CV:  Regular rate and rhythm, normal heart sounds Resp:  Increased work of breathing with tachypnea and almost guppy breathing as of gasping for air, can only speak a couple of words at a time.  Diminished breath sounds throughout but equal bilaterally.  No wheezing. Abd:  No distention.  No tenderness to palpation of the abdomen. Other:  Pale/pallor,  appears chronically ill and in acute distress   ED Results / Procedures / Treatments   Labs (all labs ordered are listed, but only abnormal results are displayed) Labs Reviewed  COMPREHENSIVE METABOLIC PANEL WITH GFR - Abnormal; Notable for the following components:      Result Value   Glucose, Bld 162 (*)    Albumin 3.3 (*)    AST <10 (*)    All other components within normal limits  CBC WITH DIFFERENTIAL/PLATELET - Abnormal; Notable for the following components:   RBC 3.68 (*)    Hemoglobin 11.1 (*)    HCT 35.9 (*)    All other components within normal limits  BLOOD GAS, VENOUS - Abnormal; Notable for the following components:   pCO2, Ven 67 (*)    pO2, Ven <31 (*)    Bicarbonate 37.0 (*)    Acid-Base Excess 8.8 (*)    All other components within normal limits  RESP PANEL BY RT-PCR (RSV, FLU A&B, COVID)  RVPGX2  CULTURE,  BLOOD (ROUTINE X 2)  CULTURE, BLOOD (ROUTINE X 2)  LACTIC ACID, PLASMA  PROTIME-INR  URINALYSIS, W/ REFLEX TO CULTURE (INFECTION SUSPECTED)  BRAIN NATRIURETIC PEPTIDE  TROPONIN I (HIGH SENSITIVITY)  TROPONIN I (HIGH SENSITIVITY)     EKG  ED ECG REPORT I, Darleene Dome, the attending physician, personally viewed and interpreted this ECG.  Date: 11/16/2023 EKG Time: 4:48 AM Rate: 88 Rhythm: normal sinus rhythm QRS Axis: Left axis deviation Intervals: Questionable left anterior fascicular block with LVH ST/T Wave abnormalities: Non-specific ST segment / T-wave changes, but no clear evidence of acute ischemia. Narrative Interpretation: no definitive evidence of acute ischemia; does not meet STEMI criteria.    RADIOLOGY See ED course for details   PROCEDURES:  Critical Care performed: Yes, see critical care procedure note(s)  .Critical Care  Performed by: Dome Darleene, MD Authorized by: Dome Darleene, MD   Critical care provider statement:    Critical care time (minutes):  45   Critical care time was exclusive of:  Separately billable  procedures and treating other patients   Critical care was necessary to treat or prevent imminent or life-threatening deterioration of the following conditions:  Respiratory failure   Critical care was time spent personally by me on the following activities:  Development of treatment plan with patient or surrogate, evaluation of patient's response to treatment, examination of patient, obtaining history from patient or surrogate, ordering and performing treatments and interventions, ordering and review of laboratory studies, ordering and review of radiographic studies, pulse oximetry, re-evaluation of patient's condition and review of old charts .1-3 Lead EKG Interpretation  Performed by: Dome Darleene, MD Authorized by: Dome Darleene, MD     Interpretation: normal     ECG rate:  88   ECG rate assessment: normal     Rhythm: sinus rhythm     Ectopy: none     Conduction: normal       IMPRESSION / MDM / ASSESSMENT AND PLAN / ED COURSE  I reviewed the triage vital signs and the nursing notes.                              Differential diagnosis includes, but is not limited to, COPD exacerbation, pneumonia, pneumothorax, ACS, PE.  Patient's presentation is most consistent with acute presentation with potential threat to life or bodily function.  Labs/studies ordered: I ordered standard sepsis labs/studies including the following: respiratory viral panel PCR swab, blood cultures x2, pro time-INR, CMP, urinalysis, urine culture, lactic acid, CBC with differential, high-sensitivity troponin, lipase, 1-view chest x-ray, EKG. VBG, troponin  Interventions/Medications given:  Medications  lactated ringers  bolus 1,000 mL (has no administration in time range)  cefTRIAXone  (ROCEPHIN ) 2 g in sodium chloride  0.9 % 100 mL IVPB (2 g Intravenous New Bag/Given 11/16/23 0614)  azithromycin  (ZITHROMAX ) 500 mg in sodium chloride  0.9 % 250 mL IVPB (has no administration in time range)  ipratropium-albuterol   (DUONEB) 0.5-2.5 (3) MG/3ML nebulizer solution 3 mL (3 mLs Nebulization Given 11/16/23 0456)  ipratropium-albuterol  (DUONEB) 0.5-2.5 (3) MG/3ML nebulizer solution 3 mL (3 mLs Nebulization Given 11/16/23 0456)  ipratropium-albuterol  (DUONEB) 0.5-2.5 (3) MG/3ML nebulizer solution 3 mL (3 mLs Nebulization Given 11/16/23 0456)  methylPREDNISolone  sodium succinate (SOLU-MEDROL ) 125 mg/2 mL injection 62.5 mg (62.5 mg Intravenous Given 11/16/23 0457)  ondansetron  (ZOFRAN ) injection 4 mg (4 mg Intravenous Given 11/16/23 0525)    (Note:  hospital course my include additional interventions and/or labs/studies not listed above.)  Ill appearing patient with severe respiratory distress.  She arrives with a DNR order in place and a MOST form indicating limited interventions but to include IV fluids and antibiotics.  She was initially satting 100% upon arrival to the ED on room air but she quickly desatted.  I am ordering BiPAP for her and will obtain a broad workup.  The patient is on the cardiac monitor to evaluate for evidence of arrhythmia and/or significant heart rate changes.   Clinical Course as of 11/16/23 9379  [facility] Nov 16, 2023  0523 pCO2, Ven(!): 67 [CF]  9384 I independently viewed and interpret the patient's chest x-ray and I am concerned about the probability of multifocal pneumonia.  This concern was echoed by radiology.  The patient is tachypneic with acute organ dysfunction of hypoxia.  I am calling a code sepsis and treating her empirically with 2 g ceftriaxone  and azithromycin  500 mg IV.  She does not meet criteria for severe sepsis and I am concerned that overly aggressive IV fluids may be very detrimental for her given that I cannot intubate her where she does develop pulmonary edema and she is already on maximal therapy with the BiPAP.  I ordered 1 L of IV fluids for nonsevere sepsis with no evidence of septic shock.  Consulting the hospitalist for admission. [CF]  9382 I reassessed the patient and  she is feeling better on the BiPAP but still ill-appearing.  I updated her about the diagnosis and need for admission. [CF]  [phone] I consulted by phone with the admitting hospitalist, and they will admit the patient - Dr. Lawence [CF]    Clinical Course User Index [CF] Gordan Huxley, MD     FINAL CLINICAL IMPRESSION(S) / ED DIAGNOSES   Final diagnoses:  COPD exacerbation (HCC)  Sepsis with acute hypoxic respiratory failure without septic shock, due to unspecified organism [facility])  Multifocal pneumonia     Rx / DC Orders   ED Discharge Orders     None        Note:  This document was prepared using Dragon voice recognition software and may include unintentional dictation errors.   Gordan Huxley, MD 11/16/23 [phone]

## 2023-11-16 NOTE — ED Notes (Signed)
 Pt resting comfortably, tolerating bipap well. Family member at bedside.

## 2023-11-16 NOTE — Consult Note (Signed)
 CODE SEPSIS - PHARMACY COMMUNICATION  **Broad Spectrum Antibiotics should be administered within 1 hour of Sepsis diagnosis**  Time Code Sepsis Called/Page Received: 0610  Antibiotics Ordered: azithromycin , ceftriaxone   Time of 1st antibiotic administration: 0614  Additional action taken by pharmacy: n/a  If necessary, Name of Provider/Nurse Contacted: n/a    Quisha Mabie A Tyan Lasure ,PharmD Clinical Pharmacist  11/16/2023  6:18 AM

## 2023-11-16 NOTE — Sepsis Progress Note (Signed)
 Elink monitoring for the code sepsis protocol.

## 2023-11-16 NOTE — H&P (Signed)
 History and Physical    Montina Dorrance FMW:[id number] DOB: 1945-05-30 DOA: 11/16/2023  DOS: the patient was seen and examined on 11/16/2023  PCP: Sadie Manna, MD   Patient coming from: SNF  I have personally briefly reviewed patient's old medical records in [facility]  Chief Complaint: Shortness of breath  HPI: Enyla Lisbon is a pleasant 78 y.o. female with medical history significant for asthma/COPD on home oxygen 4 L at baseline, DM, HTN, CVA HLD who was brought in by ambulance to [facility] ED for shortness of breath.  Patient is a resident of [facility]. EMS notes she was satting at 80% on 4 L of oxygen but is unclear if she uses supplemental oxygen at baseline.  She is unable to provide much history but confirms shortness of breath and some pain between her shoulder blades. Patient was seen and examined at bedside.  Patient was on BiPAP.  Her saturation was 100% on 40% FiO2.  Her BiPAP was removed and placed her on 4 L.  She did well.  She was able to provide me the history.  Patient stated that she was feeling shortness of breath for the last 3 to 4 days.  This morning it got worse she was not able to breathe and ambulance was called.  She denies any fever or chills.  She denies any nausea or vomiting.  She denies any chest pain or palpitations.  ED Course: Upon arrival to the ED, patient is found to be hypoxemic, tachycardic, tachypneic at 25.  Patient was given Solu-Medrol  by EMS, nebulization, antibiotics ceftriaxone  and azithromycin .  Her pCO2 was 67.  COVID and flu was negative.  Chest x-ray showed possible pneumonia.  Hospitalist service was consulted for evaluation for admission.  Review of Systems:  ROS  All other systems negative except as noted in the HPI.  Past Medical History:  Diagnosis Date   Asthma    Cancer (HCC) 2010   Left ear cancer. BCC   COPD (chronic obstructive pulmonary disease) (HCC)    COVID-19 05/27/2019   Diabetes mellitus  without complication (HCC)    Emphysema lung (HCC)    Hypertension    Stroke [facility])     Past Surgical History:  Procedure Laterality Date   ABDOMINAL HYSTERECTOMY     BREAST BIOPSY Left 1990   neg   CHOLECYSTECTOMY     COLONOSCOPY WITH PROPOFOL  N/A 12/01/2017   Procedure: COLONOSCOPY WITH PROPOFOL ;  Surgeon: Toledo, Ladell POUR, MD;  Location: [facility] ENDOSCOPY;  Service: Gastroenterology;  Laterality: N/A;   ESOPHAGOGASTRODUODENOSCOPY N/A 12/01/2017   Procedure: ESOPHAGOGASTRODUODENOSCOPY (EGD);  Surgeon: Toledo, Ladell POUR, MD;  Location: [facility] ENDOSCOPY;  Service: Gastroenterology;  Laterality: N/A;   NECK SURGERY       reports that she has quit smoking. She has been exposed to tobacco smoke. She has never used smokeless tobacco. She reports that she does not drink alcohol and does not use drugs.  Allergies  Allergen Reactions   Codeine Nausea Only    Can take along with medication for nausea   Morphine  And Codeine Nausea Only    Can take along with medication for nausea   Prednisone  Other (See Comments)    Elevated BGL into the 300's    Family History  Problem Relation Age of Onset   Ovarian cancer Mother    Lung cancer Father    Breast cancer Paternal Aunt     Prior to Admission medications   Medication Sig Start Date End  Date Taking? Authorizing Provider  acetaminophen  (TYLENOL ) 325 MG tablet Take 2 tablets (650 mg total) by mouth every 6 (six) hours as needed for mild pain (or Fever >/= 101). 08/12/21   Lenon Marien CROME, MD  albuterol  (PROVENTIL ) (2.5 MG/3ML) 0.083% nebulizer solution Take 2.5 mg by nebulization every 6 (six) hours. 01/23/20   [provider]  alendronate  (FOSAMAX ) 70 MG tablet Take 70 mg by mouth once a week. 12/16/19   [provider]  amLODipine  (NORVASC ) 5 MG tablet Take 5 mg by mouth daily. 02/01/21   [provider]  atorvastatin  (LIPITOR) 40 MG tablet Take 40 mg by mouth daily.     [provider]  citalopram   (CELEXA ) 10 MG tablet Take 10 mg by mouth daily. 03/04/21   [provider]  clonazePAM  (KLONOPIN ) 1 MG tablet Take 1 mg by mouth in the morning.    [provider]  fenofibrate  160 MG tablet Take 160 mg by mouth daily.  10/06/15   [provider]  hydrALAZINE  (APRESOLINE ) 25 MG tablet Take 25 mg by mouth in the morning and at bedtime. 08/04/21   [provider]  levothyroxine  (SYNTHROID , LEVOTHROID) 112 MCG tablet Take 112 mcg by mouth daily before breakfast.  09/15/15   [provider]  losartan  (COZAAR ) 100 MG tablet Take 100 mg by mouth daily.    [provider]  meclizine  (ANTIVERT ) 25 MG tablet Take 25 mg by mouth 3 (three) times daily as needed for dizziness. 03/29/21   [provider]  metFORMIN  (GLUCOPHAGE ) 500 MG tablet Take 500 mg by mouth 2 (two) times daily with a meal.     [provider]  montelukast  (SINGULAIR ) 10 MG tablet Take 10 mg by mouth at bedtime.  06/06/17   [provider]  OXYGEN Inhale 4 L/min into the lungs continuous.     [provider]  pantoprazole  (PROTONIX ) 40 MG tablet Take 1 tablet by mouth 2 (two) times daily with breakfast and lunch. 10/20/15   [provider]  Potassium Gluconate 2.5 MEQ TABS Take 99 mg by mouth at bedtime.    [provider]  pramipexole  (MIRAPEX ) 0.5 MG tablet Take 0.5 mg by mouth in the morning and at bedtime.    [provider]  SPIRIVA  RESPIMAT 2.5 MCG/ACT AERS Inhale 2 puffs into the lungs daily at 12 noon. 05/01/21   [provider]  traMADol  (ULTRAM ) 50 MG tablet Take 50 mg by mouth daily as needed for pain. 04/27/21   [provider]  traZODone  (DESYREL ) 150 MG tablet Take 150 mg by mouth at bedtime. 08/14/17   [provider]  TRELEGY ELLIPTA 100-62.5-25 MCG/ACT AEPB Inhale 1 puff into the lungs daily. 07/15/21   [provider]  VENTOLIN  HFA 108 (90 Base) MCG/ACT inhaler Inhale 2 puffs into the  lungs every 3 (three) hours as needed for wheezing or shortness of breath.  10/11/15   [provider]  vitamin B-12 (CYANOCOBALAMIN ) 1000 MCG tablet Take 1,000 mcg by mouth daily.    [provider]  Vitamin D3 (VITAMIN D ) 25 MCG tablet Take 1,000 Units by mouth daily.    [provider]    Physical Exam: Vitals:   11/16/23 0825 11/16/23 0900 11/16/23 1121 11/16/23 1122  BP: 135/64  126/65   Pulse: 75 77 73   Resp: (!) 26 20 (!) 26 (!) 24  Temp: 98.1 F (36.7 C)   98.5 F (36.9 C)  TempSrc:  Oral  SpO2: 100% 92% 97%   Weight:      Height:        Physical Exam   Constitutional: Alert, awake, calm, comfortable HEENT: Neck supple Respiratory: Clear to auscultation B/L, no wheezing, no rales.  Cardiovascular: Regular rate and rhythm, no murmurs / rubs / gallops. No extremity edema. 2+ pedal pulses. No carotid bruits.  Abdomen: Soft, no tenderness, Bowel sounds positive.  Musculoskeletal: no clubbing / cyanosis. Good ROM, no contractures. Normal muscle tone.  Skin: no rashes, lesions, ulcers. Neurologic: CN 2-12 grossly intact. Sensation intact, No focal deficit identified Psychiatric: Alert and oriented x 3. Normal mood.    Labs on Admission: I have personally reviewed following labs and imaging studies  CBC: Recent Labs  Lab 11/16/23 0446  WBC 7.5  NEUTROABS 6.0  HGB 11.1*  HCT 35.9*  MCV 97.6  PLT 187   Basic Metabolic Panel: Recent Labs  Lab 11/16/23 0446  NA 144  K 3.7  CL 104  CO2 30  GLUCOSE 162*  BUN 17  CREATININE 0.70  CALCIUM  9.2   GFR: Estimated Creatinine Clearance: 50.9 mL/min (by C-G formula based on SCr of 0.7 mg/dL). Liver Function Tests: Recent Labs  Lab 11/16/23 0446  AST <10*  ALT 8  ALKPHOS 71  BILITOT 0.9  PROT 6.8  ALBUMIN 3.3*   No results for input(s): LIPASE, AMYLASE in the last 168 hours. No results for input(s): AMMONIA in the last 168 hours. Coagulation Profile: Recent Labs  Lab  11/16/23 0446  INR 1.1   Cardiac Enzymes: Recent Labs  Lab 11/16/23 0446 11/16/23 0648  TROPONINIHS 11 11   BNP (last 3 results) Recent Labs    11/16/23 0446  BNP 83.1   HbA1C: No results for input(s): HGBA1C in the last 72 hours. CBG: No results for input(s): GLUCAP in the last 168 hours. Lipid Profile: No results for input(s): CHOL, HDL, LDLCALC, TRIG, CHOLHDL, LDLDIRECT in the last 72 hours. Thyroid  Function Tests: No results for input(s): TSH, T4TOTAL, FREET4, T3FREE, THYROIDAB in the last 72 hours. Anemia Panel: No results for input(s): VITAMINB12, FOLATE, FERRITIN, TIBC, IRON, RETICCTPCT in the last 72 hours. Urine analysis:    Component Value Date/Time   COLORURINE YELLOW (A) 06/28/2021 1535   APPEARANCEUR CLEAR (A) 06/28/2021 1535   APPEARANCEUR Clear 03/18/2013 1319   LABSPEC 1.017 06/28/2021 1535   LABSPEC 1.005 03/18/2013 1319   PHURINE 5.0 06/28/2021 1535   GLUCOSEU NEGATIVE 06/28/2021 1535   GLUCOSEU Negative 03/18/2013 1319   HGBUR NEGATIVE 06/28/2021 1535   BILIRUBINUR NEGATIVE 06/28/2021 1535   BILIRUBINUR Negative 03/18/2013 1319   KETONESUR NEGATIVE 06/28/2021 1535   PROTEINUR NEGATIVE 06/28/2021 1535   UROBILINOGEN 1.0 06/24/2008 1545   NITRITE NEGATIVE 06/28/2021 1535   LEUKOCYTESUR TRACE (A) 06/28/2021 1535   LEUKOCYTESUR 1+ 03/18/2013 1319    Radiological Exams on Admission: I have personally reviewed images DG Chest Port 1 View if patient is in a treatment room. Result Date: 11/16/2023 CLINICAL DATA:  78 year old female with possible sepsis. Shortness of breath. EXAM: PORTABLE CHEST 1 VIEW COMPARISON:  Chest radiographs 03/22/2022 and earlier. FINDINGS: Portable AP upright view at 0449 hours. Large lung volumes. Centrilobular emphysema by CTA in 2023. Normal cardiac size and mediastinal contours. Visualized tracheal air column is within normal limits. No pneumothorax, pleural effusion or consolidation.  However, compared to last year multifocal bilateral irregular peribronchial opacity appears increased. No pulmonary edema. Prior cervical ACDF. Stable visualized osseous structures. Negative visible bowel gas. IMPRESSION:  Emphysema (ICD10-J43.9), with widespread bilateral new irregular peribronchial opacity compatible with bilateral acute infectious exacerbation. No consolidation or pleural effusion at this time. Electronically Signed   By: VEAR Hurst M.D.   On: 11/16/2023 05:23    EKG: My personal interpretation of EKG shows: Sinus rhythm    Assessment/Plan Principal Problem:   Acute respiratory failure with hypoxia (HCC) Active Problems:   Acute exacerbation of chronic obstructive pulmonary disease (COPD) (HCC)   Gastroesophageal reflux disease without esophagitis   Hypothyroidism, unspecified   Diabetes mellitus type 2, uncomplicated (HCC)   Benign essential hypertension    Assessment and Plan: 78 year old nursing home resident who was brought in for acute shortness of breath.  She has a history of COPD on home oxygen 4 L at baseline.  1.  Acute on chronic hypoxemic/hypercarbic respiratory failure - She will be admitted to hospital as inpatient. - Her pCO2 was 67 on admission. - She was given couple hours of BiPAP. - Her saturation has improved. - She was given Solu-Medrol , antibiotic nebulization. - Will continue Solu-Medrol , antibiotics and nebulizations. - Will give her during the night and check pCO2  2.  COPD exacerbation - May be related to pneumonia - Treatment as above  3.  Community-acquired pneumonia - She was given ceftriaxone  and azithromycin  in the ED - COVID and flu negative - Continue antibiotics and steroid as mentioned above - Maintain saturation more than 90% - Continue to follow the cultures  4.  Diabetes - Continue insulin  sliding scale  5.  HTN - Continue home medications - Continue to monitor blood pressure      DVT prophylaxis: Lovenox  Code  Status: Full Code Family Communication: Patient's son was at bedside.  Patient was alert awake oriented.  Patient's son and patient discussed and they advised to change the CODE STATUS to full code.  Extensive counseling was done but they were requesting to be full code. Disposition Plan: Back to nursing home Consults called: None Admission status: Inpatient, Telemetry bed   Nena Rebel, MD [facility] 11/16/2023, 11:27 AM

## 2023-11-16 NOTE — ED Triage Notes (Signed)
 Patient brought in via [facility] from [facility] due to shortness of breath. EMS reports patient was 86% on 4L [place], unclear is this is baseline. On arrival patient's O2 sats are good on room air but work of breathing is significantly increased. Unclear how long symptoms have been going on.

## 2023-11-17 DIAGNOSIS — E039 Hypothyroidism, unspecified: Secondary | ICD-10-CM

## 2023-11-17 DIAGNOSIS — J441 Chronic obstructive pulmonary disease with (acute) exacerbation: Secondary | ICD-10-CM | POA: Diagnosis not present

## 2023-11-17 DIAGNOSIS — J9621 Acute and chronic respiratory failure with hypoxia: Secondary | ICD-10-CM

## 2023-11-17 DIAGNOSIS — E1165 Type 2 diabetes mellitus with hyperglycemia: Secondary | ICD-10-CM | POA: Diagnosis not present

## 2023-11-17 DIAGNOSIS — I10 Essential (primary) hypertension: Secondary | ICD-10-CM

## 2023-11-17 DIAGNOSIS — J189 Pneumonia, unspecified organism: Secondary | ICD-10-CM

## 2023-11-17 DIAGNOSIS — F32A Depression, unspecified: Secondary | ICD-10-CM | POA: Insufficient documentation

## 2023-11-17 DIAGNOSIS — F3289 Other specified depressive episodes: Secondary | ICD-10-CM

## 2023-11-17 LAB — RESPIRATORY PANEL BY PCR

## 2023-11-17 LAB — HEMOGLOBIN A1C
Hgb A1c MFr Bld: 6.1 % — ABNORMAL HIGH (ref 4.8–5.6)
Mean Plasma Glucose: 128 mg/dL

## 2023-11-17 LAB — PROCALCITONIN: Procalcitonin: 0.16 ng/mL

## 2023-11-17 LAB — GLUCOSE, CAPILLARY
Glucose-Capillary: 267 mg/dL — ABNORMAL HIGH (ref 70–99)
Glucose-Capillary: 281 mg/dL — ABNORMAL HIGH (ref 70–99)
Glucose-Capillary: 281 mg/dL — ABNORMAL HIGH (ref 70–99)
Glucose-Capillary: 181 mg/dL — ABNORMAL HIGH (ref 70–99)

## 2023-11-17 MED ORDER — HYDROCODONE-ACETAMINOPHEN 5-325 MG PO TABS
1.0000 | ORAL_TABLET | ORAL | Status: DC | PRN
  Administered 2023-11-18 – 2023-11-20 (×3): 1 via ORAL
  Filled 2023-11-17 (×5): qty 1

## 2023-11-17 MED ORDER — ALBUTEROL SULFATE (2.5 MG/3ML) 0.083% IN NEBU
2.5000 mg | INHALATION_SOLUTION | RESPIRATORY_TRACT | Status: DC | PRN
  Administered 2023-11-17 – 2023-11-20 (×4): 2.5 mg via RESPIRATORY_TRACT
  Filled 2023-11-17 (×3): qty 3

## 2023-11-17 MED ORDER — PRAMIPEXOLE DIHYDROCHLORIDE 0.25 MG PO TABS
0.5000 mg | ORAL_TABLET | Freq: Two times a day (BID) | ORAL | Status: DC
  Administered 2023-11-17 – 2023-11-24 (×15): 0.5 mg via ORAL
  Filled 2023-11-17 (×16): qty 2

## 2023-11-17 MED ORDER — INSULIN GLARGINE 100 UNIT/ML ~~LOC~~ SOLN
6.0000 [IU] | Freq: Every day | SUBCUTANEOUS | Status: DC
  Administered 2023-11-17 – 2023-11-24 (×8): 6 [IU] via SUBCUTANEOUS
  Filled 2023-11-17 (×9): qty 0.06

## 2023-11-17 MED ORDER — CLONAZEPAM 1 MG PO TABS
1.0000 mg | ORAL_TABLET | Freq: Two times a day (BID) | ORAL | Status: DC
  Administered 2023-11-17 – 2023-11-24 (×15): 1 mg via ORAL
  Filled 2023-11-17 (×15): qty 1

## 2023-11-17 MED ORDER — FLUTICASONE PROPIONATE 50 MCG/ACT NA SUSP
2.0000 | NASAL | Status: DC
  Administered 2023-11-17 – 2023-11-23 (×4): 2 via NASAL
  Filled 2023-11-17: qty 16

## 2023-11-17 MED ORDER — MONTELUKAST SODIUM 10 MG PO TABS
10.0000 mg | ORAL_TABLET | Freq: Every day | ORAL | Status: DC
Start: 2023-11-17 — End: 2023-11-25
  Administered 2023-11-17 – 2023-11-23 (×7): 10 mg via ORAL
  Filled 2023-11-17 (×7): qty 1

## 2023-11-17 MED ORDER — BUDESON-GLYCOPYRROL-FORMOTEROL 160-9-4.8 MCG/ACT IN AERO
2.0000 | INHALATION_SPRAY | Freq: Two times a day (BID) | RESPIRATORY_TRACT | Status: DC
  Administered 2023-11-17 – 2023-11-24 (×14): 2 via RESPIRATORY_TRACT
  Filled 2023-11-17: qty 5.9

## 2023-11-17 MED ORDER — LORATADINE 10 MG PO TABS
10.0000 mg | ORAL_TABLET | Freq: Every day | ORAL | Status: DC
  Administered 2023-11-17 – 2023-11-24 (×8): 10 mg via ORAL
  Filled 2023-11-17 (×8): qty 1

## 2023-11-17 MED ORDER — ALBUTEROL SULFATE (2.5 MG/3ML) 0.083% IN NEBU
2.5000 mg | INHALATION_SOLUTION | Freq: Three times a day (TID) | RESPIRATORY_TRACT | Status: DC
  Administered 2023-11-18 – 2023-11-24 (×20): 2.5 mg via RESPIRATORY_TRACT
  Filled 2023-11-17 (×21): qty 3

## 2023-11-17 MED ORDER — SERTRALINE HCL 50 MG PO TABS
100.0000 mg | ORAL_TABLET | Freq: Every day | ORAL | Status: DC
Start: 2023-11-17 — End: 2023-11-25
  Administered 2023-11-17 – 2023-11-24 (×8): 100 mg via ORAL
  Filled 2023-11-17 (×8): qty 2

## 2023-11-17 NOTE — Assessment & Plan Note (Signed)
 Continue Cozaar 

## 2023-11-17 NOTE — Assessment & Plan Note (Signed)
 Continue Solu-Medrol , albuterol  nebulizer, start Breztri  inhaler.  Consulted pulmonary Dr. Theotis

## 2023-11-17 NOTE — Assessment & Plan Note (Signed)
 On Zoloft  and clonazepam 

## 2023-11-17 NOTE — Assessment & Plan Note (Signed)
 Patient came in with respiratory distress requiring BiPAP.  Continue BiPAP at night and 4 L of oxygen during the day.

## 2023-11-17 NOTE — Plan of Care (Signed)

## 2023-11-17 NOTE — Assessment & Plan Note (Signed)
 Continue levothyroxine 

## 2023-11-17 NOTE — Assessment & Plan Note (Addendum)
 COVID test negative.  Will send off viral respiratory panel.  Procalcitonin only 0.16.  Continue Rocephin  and Zithromax .  Sepsis ruled out since patient did not meet SIRS criteria.

## 2023-11-17 NOTE — Inpatient Diabetes Management (Signed)
 Inpatient Diabetes Program Recommendations  AACE/ADA: New Consensus Statement on Inpatient Glycemic Control   Target Ranges:  Prepandial:   less than 140 mg/dL      Peak postprandial:   less than 180 mg/dL (1-2 hours)      Critically ill patients:  140 - 180 mg/dL    Latest Reference Range & Units 11/16/23 11:24 11/16/23 16:54 11/16/23 20:38 11/17/23 07:14  Glucose-Capillary 70 - 99 mg/dL 739 (H) 665 (H) 677 (H) 267 (H)   Review of Glycemic Control  Diabetes history: DM2 Outpatient Diabetes medications: None; Prednisone  10mg  daily  Current orders for Inpatient glycemic control: Novolog  0-6 units TID with meals, Novolog  0-5 units HS, Solu-medrol  40mg  every 6 hours.   Inpatient Diabetes Program Recommendations:   Insulin : If steroids are continued, please consider starting insulin  Glargine 6 units Q24hrs.   Thanks,  Earnie Gainer, RN, MSN, CDCES Diabetes Coordinator Inpatient Diabetes Program [phone] (Team Pager from 8am to 5pm)

## 2023-11-17 NOTE — Plan of Care (Signed)
   Problem: Elimination: Goal: Will not experience complications related to urinary retention Outcome: Progressing   Problem: Safety: Goal: Ability to remain free from injury will improve Outcome: Progressing   Problem: Skin Integrity: Goal: Risk for impaired skin integrity will decrease Outcome: Progressing

## 2023-11-17 NOTE — Progress Notes (Signed)
 Progress Note   Patient: Kristin [name] FMW:[id number] DOB: 1945-04-22 DOA: 11/16/2023     1 DOS: the patient was seen and examined on 11/17/2023   Brief hospital course: 78 y.o. female with medical history significant for asthma/COPD on home oxygen 4 L at baseline, DM, HTN, CVA HLD who was brought in by ambulance to [facility] ED for shortness of breath.  Patient is a resident of [facility]. EMS notes she was satting at 80% on 4 L of oxygen but is unclear if she uses supplemental oxygen at baseline.  She is unable to provide much history but confirms shortness of breath and some pain between her shoulder blades. Patient was seen and examined at bedside.  Patient was on BiPAP.  Her saturation was 100% on 40% FiO2.  Her BiPAP was removed and placed her on 4 L.  She did well.  She was able to provide me the history.  Patient stated that she was feeling shortness of breath for the last 3 to 4 days.  This morning it got worse she was not able to breathe and ambulance was called.  She denies any fever or chills.  She denies any nausea or vomiting.  She denies any chest pain or palpitations.   ED Course: Upon arrival to the ED, patient is found to be hypoxemic, tachycardic, tachypneic at 25.  Patient was given Solu-Medrol  by EMS, nebulization, antibiotics ceftriaxone  and azithromycin .  Her pCO2 was 67.  COVID and flu was negative.  Chest x-ray showed possible pneumonia.  Hospitalist service was consulted for evaluation for admission.  9/2.  Patient still feeling short of breath.  She states she sees Dr. Theotis as outpatient.  Continue steroids and nebulizers and antibiotics for pneumonia  Assessment and Plan: * Acute on chronic respiratory failure with hypoxia and hypercapnia (HCC) Patient came in with respiratory distress requiring BiPAP.  Continue BiPAP at night and 4 L of oxygen during the day.  Acute exacerbation of chronic obstructive pulmonary disease (COPD) (HCC) Continue Solu-Medrol ,  albuterol  nebulizer, start Breztri  inhaler.  Consulted pulmonary Dr. Theotis  Multifocal pneumonia COVID test negative.  Will send off viral respiratory panel.  Procalcitonin only 0.16.  Continue Rocephin  and Zithromax .  Sepsis ruled out since patient did not meet SIRS criteria.  Uncontrolled type 2 diabetes mellitus with hyperglycemia, without long-term current use of insulin  (HCC) Sugars high secondary to steroids.  Start glargine insulin  and continue sliding scale.  Depression On Zoloft  and clonazepam   Benign essential hypertension Continue Cozaar   Hypothyroidism, unspecified Continue levothyroxine         Subjective: Patient states she has been having trouble breathing for a week.  Has been coughing and wheezing.  Patient admitted with pneumonia acute on chronic respiratory failure and COPD exacerbation.  Physical Exam: Vitals:   11/17/23 0740 11/17/23 0835 11/17/23 1121 11/17/23 1227  BP:  130/62 (!) 140/51   Pulse:   81   Resp:   (!) 24   Temp:  98.1 F (36.7 C) 97.6 F (36.4 C)   TempSrc:  Oral Oral   SpO2: 98% 93% 99% 99%  Weight:      Height:       Physical Exam HENT:     Head: Normocephalic.  Eyes:     General: Lids are normal.     Conjunctiva/sclera: Conjunctivae normal.  Cardiovascular:     Rate and Rhythm: Normal rate and regular rhythm.     Heart sounds: Normal heart sounds, S1 normal and S2 normal.  Pulmonary:     Effort: Accessory muscle usage present.     Breath sounds: Examination of the right-middle field reveals decreased breath sounds and wheezing. Examination of the left-middle field reveals decreased breath sounds and wheezing. Examination of the right-lower field reveals decreased breath sounds and rhonchi. Examination of the left-lower field reveals decreased breath sounds and rhonchi. Decreased breath sounds, wheezing and rhonchi present. No rales.  Abdominal:     Palpations: Abdomen is soft.     Tenderness: There is no abdominal  tenderness.  Musculoskeletal:     Right lower leg: No swelling.     Left lower leg: No swelling.  Skin:    General: Skin is warm.     Findings: No rash.  Neurological:     Mental Status: She is alert and oriented to person, place, and time.     Data Reviewed: First vBG showing a PCO2 of 67, pH of 7.35, procalcitonin 0.16, creatinine 0.7, electrolytes normal, BNP 83.1, lactic acid 0.8, white blood cell count 7.5, hemoglobin 11.1, platelet count 187 chest x-ray showing emphysema with widespread bilateral new irregular peribronchial opacity compatible with bilateral acute infectious exacerbation.  Family Communication: Spoke with son on the phone  Disposition: Status is: Inpatient Remains inpatient appropriate because: Need to get respiratory status improved prior to disposition  Planned Discharge Destination: Long-term care    Time spent: 28 minutes  Author: Charlie Patterson, MD 11/17/2023 12:41 PM  For on call review [email or web address].

## 2023-11-17 NOTE — Assessment & Plan Note (Signed)
 Sugars high secondary to steroids.  Start glargine insulin  and continue sliding scale.

## 2023-11-17 NOTE — Hospital Course (Signed)
 78 y.o. female with medical history significant for asthma/COPD on home oxygen 4 L at baseline, DM, HTN, CVA HLD who was brought in by ambulance to [facility] ED for shortness of breath.  Patient is a resident of [facility]. EMS notes she was satting at 80% on 4 L of oxygen but is unclear if she uses supplemental oxygen at baseline.  She is unable to provide much history but confirms shortness of breath and some pain between her shoulder blades. Patient was seen and examined at bedside.  Patient was on BiPAP.  Her saturation was 100% on 40% FiO2.  Her BiPAP was removed and placed her on 4 L.  She did well.  She was able to provide me the history.  Patient stated that she was feeling shortness of breath for the last 3 to 4 days.  This morning it got worse she was not able to breathe and ambulance was called.  She denies any fever or chills.  She denies any nausea or vomiting.  She denies any chest pain or palpitations.   ED Course: Upon arrival to the ED, patient is found to be hypoxemic, tachycardic, tachypneic at 25.  Patient was given Solu-Medrol  by EMS, nebulization, antibiotics ceftriaxone  and azithromycin .  Her pCO2 was 67.  COVID and flu was negative.  Chest x-ray showed possible pneumonia.  Hospitalist service was consulted for evaluation for admission.  9/2.  Patient still feeling short of breath.  She states she sees Dr. Theotis as outpatient.  Continue steroids and nebulizers and antibiotics for pneumonia

## 2023-11-18 ENCOUNTER — Inpatient Hospital Stay

## 2023-11-18 DIAGNOSIS — Z515 Encounter for palliative care: Secondary | ICD-10-CM | POA: Diagnosis not present

## 2023-11-18 DIAGNOSIS — J189 Pneumonia, unspecified organism: Secondary | ICD-10-CM | POA: Diagnosis not present

## 2023-11-18 DIAGNOSIS — J9621 Acute and chronic respiratory failure with hypoxia: Secondary | ICD-10-CM | POA: Diagnosis not present

## 2023-11-18 DIAGNOSIS — J9622 Acute and chronic respiratory failure with hypercapnia: Secondary | ICD-10-CM | POA: Diagnosis not present

## 2023-11-18 DIAGNOSIS — J441 Chronic obstructive pulmonary disease with (acute) exacerbation: Secondary | ICD-10-CM | POA: Diagnosis not present

## 2023-11-18 LAB — BASIC METABOLIC PANEL WITH GFR
Anion gap: 12 (ref 5–15)
BUN: 27 mg/dL — ABNORMAL HIGH (ref 8–23)
CO2: 27 mmol/L (ref 22–32)
Calcium: 8.6 mg/dL — ABNORMAL LOW (ref 8.9–10.3)
Chloride: 102 mmol/L (ref 98–111)
Creatinine, Ser: 0.87 mg/dL (ref 0.44–1.00)
GFR, Estimated: >60 mL/min (ref >60)
Glucose, Bld: 220 mg/dL — ABNORMAL HIGH (ref 70–99)
Potassium: 4.5 mmol/L (ref 3.5–5.1)
Sodium: 141 mmol/L (ref 135–145)

## 2023-11-18 LAB — CBC
HCT: 28.9 % — ABNORMAL LOW (ref 36.0–46.0)
Hemoglobin: 9.1 g/dL — ABNORMAL LOW (ref 12.0–15.0)
MCH: 30 pg (ref 26.0–34.0)
MCHC: 31.5 g/dL (ref 30.0–36.0)
MCV: 95.4 fL (ref 80.0–100.0)
Platelets: 196 K/uL (ref 150–400)
RBC: 3.03 MIL/uL — ABNORMAL LOW (ref 3.87–5.11)
RDW: 13 % (ref 11.5–15.5)
WBC: 7.8 K/uL (ref 4.0–10.5)
nRBC: 0 % (ref 0.0–0.2)

## 2023-11-18 LAB — PROCALCITONIN: Procalcitonin: <0.1 ng/mL

## 2023-11-18 LAB — GLUCOSE, CAPILLARY
Glucose-Capillary: 200 mg/dL — ABNORMAL HIGH (ref 70–99)
Glucose-Capillary: 217 mg/dL — ABNORMAL HIGH (ref 70–99)
Glucose-Capillary: 200 mg/dL — ABNORMAL HIGH (ref 70–99)
Glucose-Capillary: 139 mg/dL — ABNORMAL HIGH (ref 70–99)

## 2023-11-18 LAB — D-DIMER, QUANTITATIVE: D-Dimer, Quant: 0.65 ug{FEU}/mL — ABNORMAL HIGH (ref 0.00–0.50)

## 2023-11-18 LAB — C-REACTIVE PROTEIN: CRP: 4.7 mg/dL — ABNORMAL HIGH (ref <1.0)

## 2023-11-18 MED ORDER — PREDNISONE 50 MG PO TABS
50.0000 mg | ORAL_TABLET | Freq: Every day | ORAL | Status: DC
  Administered 2023-11-19 – 2023-11-20 (×2): 50 mg via ORAL
  Filled 2023-11-18 (×2): qty 1

## 2023-11-18 MED ORDER — HYDROXYZINE HCL 10 MG PO TABS
10.0000 mg | ORAL_TABLET | Freq: Three times a day (TID) | ORAL | Status: DC | PRN
  Administered 2023-11-18 – 2023-11-24 (×4): 10 mg via ORAL
  Filled 2023-11-18 (×7): qty 1

## 2023-11-18 MED ORDER — HYDRALAZINE HCL 20 MG/ML IJ SOLN
10.0000 mg | Freq: Four times a day (QID) | INTRAMUSCULAR | Status: DC | PRN
  Administered 2023-11-18 – 2023-11-20 (×4): 10 mg via INTRAVENOUS
  Filled 2023-11-18 (×4): qty 1

## 2023-11-18 MED ORDER — IOHEXOL 350 MG/ML SOLN
75.0000 mL | Freq: Once | INTRAVENOUS | Status: AC | PRN
  Administered 2023-11-18: 75 mL via INTRAVENOUS

## 2023-11-18 NOTE — TOC Initial Note (Signed)
 Transition of Care [facility]) - Initial/Assessment Note    Patient Details  Name: Kristin [name] MRN: [id number] Date of Birth: April 24, 1945  Transition of Care [facility]) CM/SW Contact:    Lauraine [name], LCSW Phone Number: 11/18/2023, 3:52 PM  Clinical Narrative:  CSW met with patient. No family at bedside. CSW introduced role and explained that discharge planning would be discussed. Patient confirmed she is a long-term resident at [facility] and plan is to return there at discharge. No further concerns. CSW will continue to follow patient for support and facilitate return to SNF once medically stable.               Expected Discharge Plan: Skilled Nursing Facility Barriers to Discharge: Continued Medical Work up   Patient Goals and CMS Choice            Expected Discharge Plan and Services     Post Acute Care Choice: Resumption of Svcs/PTA Provider Living arrangements for the past 2 months: Skilled Nursing Facility                                      Prior Living Arrangements/Services Living arrangements for the past 2 months: Skilled Nursing Facility Lives with:: Facility Resident Patient language and need for interpreter reviewed:: Yes Do you feel safe going back to the place where you live?: Yes      Need for Family Participation in Patient Care: Yes (Comment) Care giver support system in place?: Yes (comment)   Criminal Activity/Legal Involvement Pertinent to Current Situation/Hospitalization: No - Comment as needed  Activities of Daily Living   ADL Screening (condition at time of admission) Independently performs ADLs?: No Does the patient have a NEW difficulty with bathing/dressing/toileting/self-feeding that is expected to last >3 days?: No Does the patient have a NEW difficulty with getting in/out of bed, walking, or climbing stairs that is expected to last >3 days?: No Does the patient have a NEW difficulty with communication that is expected  to last >3 days?: No Is the patient deaf or have difficulty hearing?: No Does the patient have difficulty seeing, even when wearing glasses/contacts?: No Does the patient have difficulty concentrating, remembering, or making decisions?: No  Permission Sought/Granted Permission sought to share information with : Facility Industrial/product designer granted to share information with : Yes, Verbal Permission Granted     Permission granted to share info w AGENCY: [facility]        Emotional Assessment Appearance:: Appears stated age Attitude/Demeanor/Rapport: Engaged, Gracious Affect (typically observed): Accepting, Appropriate, Calm, Pleasant Orientation: : Oriented to Self, Oriented to Place, Oriented to  Time, Oriented to Situation Alcohol / Substance Use: Not Applicable Psych Involvement: No (comment)  Admission diagnosis:  COPD exacerbation (HCC) [J44.1] Acute respiratory failure with hypoxia (HCC) [J96.01] Multifocal pneumonia [J18.9] Sepsis with acute hypoxic respiratory failure without septic shock, due to unspecified organism (HCC) [A41.9, R65.20, J96.01] Patient Active Problem List   Diagnosis Date Noted   Acute on chronic respiratory failure with hypoxia and hypercapnia (HCC) 11/17/2023   Multifocal pneumonia 11/17/2023   Uncontrolled type 2 diabetes mellitus with hyperglycemia, without long-term current use of insulin  (HCC) 11/17/2023   Depression 11/17/2023   Respiratory distress 05/05/2021   COPD exacerbation (HCC) 05/05/2021   Shortness of breath 05/04/2021   Acute exacerbation of chronic obstructive pulmonary disease (COPD) (HCC) 02/22/2020   RLQ abdominal pain 07/22/2019  Acute respiratory failure due to COVID-19 (HCC) 05/30/2019   Carotid stenosis 10/26/2017   Pyelonephritis 09/02/2017   UTI (urinary tract infection) 11/30/2016   Encephalopathy acute 11/30/2016   Chronic respiratory failure with hypoxia (HCC) 11/30/2016   Weakness generalized  11/30/2016   Acute encephalopathy 11/30/2016   Acute respiratory failure (HCC) 05/23/2016   Malnutrition of moderate degree 05/23/2016   Syncope 10/23/2015   Major depression single episode, in partial remission (HCC) 09/07/2015   Hypothyroidism, unspecified 06/02/2014   Benign essential hypertension 06/02/2014   Gastroesophageal reflux disease without esophagitis 11/23/2013   PCP:  Sadie Manna, MD Pharmacy:   [facility] [address], [place] - [address] [place] [address] Phone: [phone] Fax: [phone]  [facility] [address] [place] [address] Phone: [phone] Fax: [phone]     Social Drivers of Health (SDOH) Social History: SDOH Screenings   Food Insecurity: No Food Insecurity (11/16/2023)  Housing: Low Risk  (11/16/2023)  Transportation Needs: No Transportation Needs (11/16/2023)  Utilities: Not At Risk (11/16/2023)  Financial Resource Strain: Low Risk  (05/13/2023)   Received from [facility]  Physical Activity: Inactive (12/29/2019)   Received from [facility]  Social Connections: Socially Isolated (11/16/2023)  Stress: Stress Concern Present (12/29/2019)   Received from [facility]  Tobacco Use: Medium Risk (11/16/2023)   SDOH Interventions:     Readmission Risk Interventions    05/06/2021   10:38 AM  Readmission Risk Prevention Plan  Transportation Screening Complete  PCP or Specialist Appt within 5-7 Days Complete  Home Care Screening Complete  Medication Review (RN CM) Complete

## 2023-11-18 NOTE — Consult Note (Signed)
 PULMONOLOGY         Date: 11/18/2023,   MRN# [id number] Kristin [name] 04/07/1945     AdmissionWeight: 59.1 kg                 CurrentWeight: 59.1 kg  Referring provider: Dr Ginnie   CHIEF COMPLAINT:   Acute on chronic hypoxemic respiratory failure   HISTORY OF PRESENT ILLNESS   This is 78 y.o. female from [facility] with PMH of  Asthma COPD overlap (ACOS) with chronic hypoxemia on home oxygen 4 L at baseline, DM, HTN, CVA HLD who was brought in by ambulance to [facility] ED for shortness of breath.  She was noted to have hypoxemia in mid 80s in the field on way to hospital.  She was using accessory muscles of respiration and appeared to be in distress.  She does not walk at baseline.  On arrival to ER she required BIPAP and improved.  She denies flu like illness, sick contacts, travel.  She denies any fever or chills.  She denies any nausea or vomiting.  She denies any chest pain or palpitations.  She normally sees Dr Theotis with [facility] pulmonology. Blood work shows acute on chronic anemia with hb from 11 to 9. RVP is negative.  CXR with possible pneumonia. PCCM consultation placed for additional evaluation and management.    PAST MEDICAL HISTORY   Past Medical History:  Diagnosis Date   Asthma    Cancer (HCC) 2010   Left ear cancer. BCC   COPD (chronic obstructive pulmonary disease) (HCC)    COVID-19 05/27/2019   Diabetes mellitus without complication (HCC)    Emphysema lung (HCC)    Hypertension    Stroke [facility])      SURGICAL HISTORY   Past Surgical History:  Procedure Laterality Date   ABDOMINAL HYSTERECTOMY     BREAST BIOPSY Left 1990   neg   CHOLECYSTECTOMY     COLONOSCOPY WITH PROPOFOL  N/A 12/01/2017   Procedure: COLONOSCOPY WITH PROPOFOL ;  Surgeon: Toledo, Ladell POUR, MD;  Location: [facility] ENDOSCOPY;  Service: Gastroenterology;  Laterality: N/A;   ESOPHAGOGASTRODUODENOSCOPY N/A 12/01/2017   Procedure: ESOPHAGOGASTRODUODENOSCOPY (EGD);  Surgeon:  Toledo, Ladell POUR, MD;  Location: [facility] ENDOSCOPY;  Service: Gastroenterology;  Laterality: N/A;   NECK SURGERY       FAMILY HISTORY   Family History  Problem Relation Age of Onset   Ovarian cancer Mother    Lung cancer Father    Breast cancer Paternal Aunt      SOCIAL HISTORY   Social History   Tobacco Use   Smoking status: Former    Passive exposure: Past   Smokeless tobacco: Never  Vaping Use   Vaping status: Never Used  Substance Use Topics   Alcohol use: No   Drug use: No     MEDICATIONS    Home Medication:    Current Medication:  Current Facility-Administered Medications:    acetaminophen  (TYLENOL ) tablet 650 mg, 650 mg, Oral, Q6H PRN, Paudel, Keshab, MD, 650 mg at 11/17/23 0851   albuterol  (PROVENTIL ) (2.5 MG/3ML) 0.083% nebulizer solution 2.5 mg, 2.5 mg, Nebulization, Q2H PRN, Josette Ade, MD, 2.5 mg at 11/18/23 9273   albuterol  (PROVENTIL ) (2.5 MG/3ML) 0.083% nebulizer solution 2.5 mg, 2.5 mg, Nebulization, TID, Wieting, Richard, MD   azithromycin  (ZITHROMAX ) 500 mg in sodium chloride  0.9 % 250 mL IVPB, 500 mg, Intravenous, Q24H, Paudel, Keshab, MD, Last Rate: 250 mL/hr at 11/18/23 0549, 500 mg at 11/18/23 0549  bisacodyl  (DULCOLAX) suppository 10 mg, 10 mg, Rectal, PRN, Paudel, Keshab, MD   budesonide -glycopyrrolate -formoterol  (BREZTRI ) 160-9-4.8 MCG/ACT inhaler 2 puff, 2 puff, Inhalation, BID, Josette, Richard, MD, 2 puff at 11/17/23 2035   cefTRIAXone  (ROCEPHIN ) 2 g in sodium chloride  0.9 % 100 mL IVPB, 2 g, Intravenous, Q24H, Paudel, Keshab, MD, Last Rate: 200 mL/hr at 11/18/23 0541, 2 g at 11/18/23 0541   clonazePAM  (KLONOPIN ) tablet 1 mg, 1 mg, Oral, Q12H, Wieting, Richard, MD, 1 mg at 11/17/23 2115   enoxaparin  (LOVENOX ) injection 40 mg, 40 mg, Subcutaneous, Q24H, Paudel, Keshab, MD, 40 mg at 11/17/23 2115   fluticasone  (FLONASE ) 50 MCG/ACT nasal spray 2 spray, 2 spray, Each Nare, QODAY, Wieting, Richard, MD, 2 spray at 11/17/23 9170   guaiFENesin   (MUCINEX ) 12 hr tablet 600 mg, 600 mg, Oral, BID PRN, Paudel, Keshab, MD, 600 mg at 11/17/23 0851   hydrALAZINE  (APRESOLINE ) injection 10 mg, 10 mg, Intravenous, Q6H PRN, Mansy, Jan A, MD, 10 mg at 11/18/23 0040   HYDROcodone -acetaminophen  (NORCO/VICODIN) 5-325 MG per tablet 1 tablet, 1 tablet, Oral, Q4H PRN, Josette Ade, MD   insulin  aspart (novoLOG ) injection 0-5 Units, 0-5 Units, Subcutaneous, QHS, Paudel, Keshab, MD, 4 Units at 11/16/23 2040   insulin  aspart (novoLOG ) injection 0-6 Units, 0-6 Units, Subcutaneous, TID WC, Paudel, Keshab, MD, 3 Units at 11/17/23 1750   insulin  glargine (LANTUS ) injection 6 Units, 6 Units, Subcutaneous, Daily, Josette, Richard, MD, 6 Units at 11/17/23 1428   levothyroxine  (SYNTHROID ) tablet 112 mcg, 112 mcg, Oral, Q0600, Paudel, Keshab, MD, 112 mcg at 11/18/23 0539   loratadine  (CLARITIN ) tablet 10 mg, 10 mg, Oral, Daily, Wieting, Richard, MD, 10 mg at 11/17/23 9171   losartan  (COZAAR ) tablet 25 mg, 25 mg, Oral, Daily, Paudel, Keshab, MD, 25 mg at 11/17/23 9171   montelukast  (SINGULAIR ) tablet 10 mg, 10 mg, Oral, QHS, Wieting, Richard, MD, 10 mg at 11/17/23 2115   ondansetron  (ZOFRAN ) injection 4 mg, 4 mg, Intravenous, Q6H PRN, Paudel, Keshab, MD, 4 mg at 11/17/23 2042   pantoprazole  (PROTONIX ) EC tablet 40 mg, 40 mg, Oral, BID WC, Paudel, Keshab, MD, 40 mg at 11/17/23 1151   pneumococcal 20-valent conjugate vaccine (PREVNAR 20 ) injection 0.5 mL, 0.5 mL, Intramuscular, Tomorrow-1000, Paudel, Keshab, MD   pramipexole  (MIRAPEX ) tablet 0.5 mg, 0.5 mg, Oral, Q12H, Wieting, Richard, MD, 0.5 mg at 11/17/23 2033   sertraline  (ZOLOFT ) tablet 100 mg, 100 mg, Oral, Daily, Josette, Richard, MD, 100 mg at 11/17/23 9171   traZODone  (DESYREL ) tablet 150 mg, 150 mg, Oral, QHS, Paudel, Keshab, MD, 150 mg at 11/17/23 2115    ALLERGIES   Codeine, Morphine  and codeine, and Prednisone      REVIEW OF SYSTEMS    Review of Systems:  Gen:  Denies  fever, sweats, chills  weigh loss  HEENT: Denies blurred vision, double vision, ear pain, eye pain, hearing loss, nose bleeds, sore throat Cardiac:  No dizziness, chest pain or heaviness, chest tightness,edema Resp:   reports dyspnea chronically  Gi: Denies swallowing difficulty, stomach pain, nausea or vomiting, diarrhea, constipation, bowel incontinence Gu:  Denies bladder incontinence, burning urine Ext:   Denies Joint pain, stiffness or swelling Skin: Denies  skin rash, easy bruising or bleeding or hives Endoc:  Denies polyuria, polydipsia , polyphagia or weight change Psych:   Denies depression, insomnia or hallucinations   Other:  All other systems negative   VS: BP (!) 149/82 (BP Location: Left Arm)   Pulse 62   Temp 97.9 F (36.6 C) (Oral)  Resp 18   Ht 5' 4 (1.626 m)   Wt 59.1 kg   SpO2 97%   BMI 22.36 kg/m      PHYSICAL EXAM    GENERAL:NAD, no fevers, chills, no weakness no fatigue HEAD: Normocephalic, atraumatic.  EYES: Pupils equal, round, reactive to light. Extraocular muscles intact. No scleral icterus.  MOUTH: Moist mucosal membrane. Dentition intact. No abscess noted.  EAR, NOSE, THROAT: Clear without exudates. No external lesions.  NECK: Supple. No thyromegaly. No nodules. No JVD.  PULMONARY: decreased breath sounds with mild rhonchi worse at bases bilaterally.  CARDIOVASCULAR: S1 and S2. Regular rate and rhythm. No murmurs, rubs, or gallops. No edema. Pedal pulses 2+ bilaterally.  GASTROINTESTINAL: Soft, nontender, nondistended. No masses. Positive bowel sounds. No hepatosplenomegaly.  MUSCULOSKELETAL: No swelling, clubbing, or edema. Range of motion full in all extremities.  NEUROLOGIC: Cranial nerves II through XII are intact. No gross focal neurological deficits. Sensation intact. Reflexes intact.  SKIN: No ulceration, lesions, rashes, or cyanosis. Skin warm and dry. Turgor intact.  PSYCHIATRIC: Mood, affect within normal limits. The patient is awake, alert and oriented x 3.  Insight, judgment intact.       IMAGING      EXAM: PORTABLE CHEST 1 VIEW   COMPARISON:  Chest radiographs 03/22/2022 and earlier.   FINDINGS: Portable AP upright view at 0449 hours. Large lung volumes. Centrilobular emphysema by CTA in 2023. Normal cardiac size and mediastinal contours. Visualized tracheal air column is within normal limits. No pneumothorax, pleural effusion or consolidation. However, compared to last year multifocal bilateral irregular peribronchial opacity appears increased. No pulmonary edema.   Prior cervical ACDF. Stable visualized osseous structures. Negative visible bowel gas.   IMPRESSION: Emphysema (ICD10-J43.9), with widespread bilateral new irregular peribronchial opacity compatible with bilateral acute infectious exacerbation. No consolidation or pleural effusion at this time.     Electronically Signed   By: VEAR Hurst M.D.   On: 11/16/2023 05:23  ASSESSMENT/PLAN    Acute on chronic hypoxemic respiratory failure    - patient seems to have moderate acute COPD exacerbation    - her viral workup is negative including COVID/FLU/RSV    - RVP 20 species is negative    - no signs of CHF with absence of LE edema, normal JVD    - CXR with nodular bilateral infiltrate    - patient at moderate risk of PE due to immobility from bedbound state - will need to rule out PE -CTPE ordered    - possible neoplasm related lesions - CT chest PE protocol will further investigate lung parenchyma for abscess or malignancy    - ddimer    - CRP     - Procalcitonin trend    -legionella ab     -strep pneumoniae ur ag     - fungitell serology      - resp cultures ordered    - blood cultures are negative x 1 day    Community acquired pneumonia    Multifocal infiltrate   - MRSA pCR  - zithromax /rocephin  currently   - additional micro as above   Moderate COPD exacerbation    - Continue current abx    - breztri  bid    - nebulizer therapy    - PT/OT    -  Incentive spirometry    - s/p solumedrol 40mg  iv daily - may transition to prednisone  taper      Bibasilar atelectasis    PT/OT    - incentive spirometry    -  will consider VEST therapy vs Metaneb if not responsive   Physical deconditioning    - patient is immobile/bedbound   - PT /OT    Goals of care    - BODE index is >8 with overall poor prognosis   - recommendation for palliative care consultation due to current full code status and may be not in line with level of aggression that patient desires.       Thank you for allowing me to participate in the care of this patient.   Patient/Family are satisfied with care plan and all questions have been answered.    Provider disclosure: Patient with at least one acute or chronic illness or injury that poses a threat to life or bodily function and is being managed actively during this encounter.  All of the below services have been performed independently by signing provider:  review of prior documentation from internal and or external health records.  Review of previous and current lab results.  Interview and comprehensive assessment during patient visit today. Review of current and previous chest radiographs/CT scans. Discussion of management and test interpretation with health care team and patient/family.   This document was prepared using Dragon voice recognition software and may include unintentional dictation errors.     Allister Lessley, M.D.  Division of Pulmonary & Critical Care Medicine

## 2023-11-18 NOTE — Progress Notes (Signed)
   11/18/23 1700  Spiritual Encounters  Type of Visit Initial  Care provided to: Patient  Conversation partners present during encounter Nurse  Referral source Nurse (RN/NT/LPN)  Reason for visit Grief/loss  OnCall Visit No  Spiritual Framework  Presenting Themes Meaning/purpose/sources of inspiration;Values and beliefs;Significant life change;Other (comment) (Husband passed away just 5 months ago. Pt. still experiencing grief.)  Values/beliefs Pt is a [place]  Patient Stress Factors Family relationships;Loss;Major life changes  Interventions  Spiritual Care Interventions Made Established relationship of care and support;Compassionate presence;Reflective listening;Normalization of emotions;Narrative/life review;Explored values/beliefs/practices/strengths;Meaning making;Bereavement/grief support;Prayer;Encouragement  Intervention Outcomes  Outcomes Connection to spiritual care;Awareness around self/spiritual resourses;Awareness of support;Reduced anxiety;Other (comment)

## 2023-11-18 NOTE — [place] FL2 (Signed)
 [place]  MEDICAID FL2 LEVEL OF CARE FORM     IDENTIFICATION  Patient Name: Kristin [name] Birthdate: September 29, 1945 Sex: female Admission Date (Current Location): 11/16/2023  [facility] and [place] Number:  Chiropodist and Address:  [facility], [address] [place], [place] [address]      Provider Number: [id number]  Attending Physician Name and Address:  Jerelene Critchley, MD  Relative Name and Phone Number:       Current Level of Care: Hospital Recommended Level of Care: Skilled Nursing Facility Prior Approval Number:    Date Approved/Denied:   PASRR Number: [id number]  Discharge Plan: SNF    Current Diagnoses: Patient Active Problem List   Diagnosis Date Noted   Acute on chronic respiratory failure with hypoxia and hypercapnia (HCC) 11/17/2023   Multifocal pneumonia 11/17/2023   Uncontrolled type 2 diabetes mellitus with hyperglycemia, without long-term current use of insulin  (HCC) 11/17/2023   Depression 11/17/2023   Respiratory distress 05/05/2021   COPD exacerbation (HCC) 05/05/2021   Shortness of breath 05/04/2021   Acute exacerbation of chronic obstructive pulmonary disease (COPD) (HCC) 02/22/2020   RLQ abdominal pain 07/22/2019   Acute respiratory failure due to COVID-19 (HCC) 05/30/2019   Carotid stenosis 10/26/2017   Pyelonephritis 09/02/2017   UTI (urinary tract infection) 11/30/2016   Encephalopathy acute 11/30/2016   Chronic respiratory failure with hypoxia (HCC) 11/30/2016   Weakness generalized 11/30/2016   Acute encephalopathy 11/30/2016   Acute respiratory failure (HCC) 05/23/2016   Malnutrition of moderate degree 05/23/2016   Syncope 10/23/2015   Major depression single episode, in partial remission (HCC) 09/07/2015   Hypothyroidism, unspecified 06/02/2014   Benign essential hypertension 06/02/2014   Gastroesophageal reflux disease without esophagitis 11/23/2013    Orientation RESPIRATION BLADDER  Height & Weight     Self, Time, Situation, Place  O2 (Nasal Cannula 4 L. Bipap discontinued on 9/3.) Continent, External catheter Weight: 130 lb 4.7 oz (59.1 kg) Height:  5' 4 (162.6 cm)  BEHAVIORAL SYMPTOMS/MOOD NEUROLOGICAL BOWEL NUTRITION STATUS   (None)  (None) Continent Diet (Regular)  AMBULATORY STATUS COMMUNICATION OF NEEDS Skin     Verbally Bruising, Other (Comment) (Erythema/redness.)                       Personal Care Assistance Level of Assistance              Functional Limitations Info  Sight, Hearing, Speech Sight Info: Adequate Hearing Info: Adequate Speech Info: Adequate    SPECIAL CARE FACTORS FREQUENCY                       Contractures Contractures Info: Not present    Additional Factors Info  Code Status, Allergies, Isolation Precautions Code Status Info: Full code Allergies Info: Codeine, Morphine  and codeine, Prednisone      Isolation Precautions Info: Droplet precautions     Current Medications (11/18/2023):  This is the current hospital active medication list Current Facility-Administered Medications  Medication Dose Route Frequency Provider Last Rate Last Admin   acetaminophen  (TYLENOL ) tablet 650 mg  650 mg Oral Q6H PRN Paudel, Nena, MD   650 mg at 11/17/23 0851   albuterol  (PROVENTIL ) (2.5 MG/3ML) 0.083% nebulizer solution 2.5 mg  2.5 mg Nebulization Q2H PRN Josette Ade, MD   2.5 mg at 11/18/23 0726   albuterol  (PROVENTIL ) (2.5 MG/3ML) 0.083% nebulizer solution 2.5 mg  2.5 mg Nebulization TID Josette Ade, MD   2.5 mg at 11/18/23  1333   azithromycin  (ZITHROMAX ) 500 mg in sodium chloride  0.9 % 250 mL IVPB  500 mg Intravenous Q24H Paudel, Keshab, MD 250 mL/hr at 11/18/23 0549 500 mg at 11/18/23 0549   bisacodyl  (DULCOLAX) suppository 10 mg  10 mg Rectal PRN Paudel, Keshab, MD       budesonide -glycopyrrolate -formoterol  (BREZTRI ) 160-9-4.8 MCG/ACT inhaler 2 puff  2 puff Inhalation BID Josette Ade, MD   2 puff at 11/18/23  0924   cefTRIAXone  (ROCEPHIN ) 2 g in sodium chloride  0.9 % 100 mL IVPB  2 g Intravenous Q24H Paudel, Nena, MD 200 mL/hr at 11/18/23 0541 2 g at 11/18/23 0541   clonazePAM  (KLONOPIN ) tablet 1 mg  1 mg Oral Q12H Wieting, Richard, MD   1 mg at 11/18/23 9081   enoxaparin  (LOVENOX ) injection 40 mg  40 mg Subcutaneous Q24H Paudel, Keshab, MD   40 mg at 11/17/23 2115   fluticasone  (FLONASE ) 50 MCG/ACT nasal spray 2 spray  2 spray Each Nare RONALDO Josette, Richard, MD   2 spray at 11/17/23 0829   guaiFENesin  (MUCINEX ) 12 hr tablet 600 mg  600 mg Oral BID PRN Paudel, Keshab, MD   600 mg at 11/17/23 0851   hydrALAZINE  (APRESOLINE ) injection 10 mg  10 mg Intravenous Q6H PRN Mansy, Jan A, MD   10 mg at 11/18/23 0040   HYDROcodone -acetaminophen  (NORCO/VICODIN) 5-325 MG per tablet 1 tablet  1 tablet Oral Q4H PRN Wieting, Richard, MD       hydrOXYzine  (ATARAX ) tablet 10 mg  10 mg Oral TID PRN Ponnala, Shruthi, MD       insulin  aspart (novoLOG ) injection 0-5 Units  0-5 Units Subcutaneous QHS Paudel, Keshab, MD   4 Units at 11/16/23 2040   insulin  aspart (novoLOG ) injection 0-6 Units  0-6 Units Subcutaneous TID WC Paudel, Keshab, MD   2 Units at 11/18/23 1356   insulin  glargine (LANTUS ) injection 6 Units  6 Units Subcutaneous Daily Josette Ade, MD   6 Units at 11/18/23 1002   levothyroxine  (SYNTHROID ) tablet 112 mcg  112 mcg Oral Q0600 Paudel, Keshab, MD   112 mcg at 11/18/23 0539   loratadine  (CLARITIN ) tablet 10 mg  10 mg Oral Daily Josette Ade, MD   10 mg at 11/18/23 9081   losartan  (COZAAR ) tablet 25 mg  25 mg Oral Daily Paudel, Nena, MD   25 mg at 11/18/23 0918   montelukast  (SINGULAIR ) tablet 10 mg  10 mg Oral QHS Wieting, Richard, MD   10 mg at 11/17/23 2115   ondansetron  (ZOFRAN ) injection 4 mg  4 mg Intravenous Q6H PRN Paudel, Keshab, MD   4 mg at 11/17/23 2042   pantoprazole  (PROTONIX ) EC tablet 40 mg  40 mg Oral BID WC Paudel, Keshab, MD   40 mg at 11/18/23 1355   pneumococcal 20-valent  conjugate vaccine (PREVNAR 20 ) injection 0.5 mL  0.5 mL Intramuscular Tomorrow-1000 Paudel, Keshab, MD       pramipexole  (MIRAPEX ) tablet 0.5 mg  0.5 mg Oral Q12H Wieting, Richard, MD   0.5 mg at 11/18/23 0917   [START ON 11/19/2023] predniSONE  (DELTASONE ) tablet 50 mg  50 mg Oral Q breakfast Aleskerov, Fuad, MD       sertraline  (ZOLOFT ) tablet 100 mg  100 mg Oral Daily Josette Ade, MD   100 mg at 11/18/23 0919   traZODone  (DESYREL ) tablet 150 mg  150 mg Oral QHS Paudel, Keshab, MD   150 mg at 11/17/23 2115     Discharge Medications: Please see discharge summary for  a list of discharge medications.  Relevant Imaging Results:  Relevant Lab Results:   Additional Information SS#: [id number]  Lauraine [name], LCSW

## 2023-11-18 NOTE — Plan of Care (Signed)
  Problem: Education: Goal: Knowledge of General Education information will improve Description: Including pain rating scale, medication(s)/side effects and non-pharmacologic comfort measures Outcome: Progressing   Problem: Health Behavior/Discharge Planning: Goal: Ability to manage health-related needs will improve Outcome: Progressing   Problem: Clinical Measurements: Goal: Ability to maintain clinical measurements within normal limits will improve Outcome: Progressing Goal: Diagnostic test results will improve Outcome: Progressing Goal: Respiratory complications will improve Outcome: Progressing Goal: Cardiovascular complication will be avoided Outcome: Progressing   Problem: Activity: Goal: Risk for activity intolerance will decrease Outcome: Progressing   Problem: Nutrition: Goal: Adequate nutrition will be maintained Outcome: Progressing   Problem: Coping: Goal: Level of anxiety will decrease Outcome: Progressing   Problem: Elimination: Goal: Will not experience complications related to bowel motility Outcome: Progressing   Problem: Safety: Goal: Ability to remain free from injury will improve Outcome: Progressing   Problem: Education: Goal: Ability to describe self-care measures that may prevent or decrease complications (Diabetes Survival Skills Education) will improve Outcome: Progressing

## 2023-11-18 NOTE — Consult Note (Signed)
 Consultation Note Date: 11/18/2023 at 1215  Patient Name: Kristin [name]  DOB: [birth year]  MRN: [id number]  Age / Sex: 78 y.o., female  PCP: Sadie Manna, MD Referring Physician: Jerelene Critchley, MD  HPI/Patient Profile: 78 y.o. 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  Patient was hypoxemic upon arrival to the ED where she required BiPAP.  Patient is being treated for acute on chronic hypoxemic respiratory failure, community-acquired pneumonia, moderate COPD exacerbation, bibasilar atelectasis, and physical deconditioning.  PMT was consulted to support patient with goals of care discussions.   Clinical Assessment and Goals of Care: Extensive chart review completed prior to meeting patient including labs, vital signs, imaging, progress notes, orders, and available advanced directive documents from current and previous encounters. I then met with patient at bedside to discuss diagnosis prognosis, GOC, EOL wishes, disposition and options.  I introduced Palliative Medicine as specialized medical care for people living with serious illness. It focuses on providing relief from the symptoms and stress of a serious illness. The goal is to improve quality of life for both the patient and the family.  We discussed a brief life review of the patient.  Patient is recently widowed (5 months ago).  Her husband passed from complications due to COPD and Parkinson's.  She has 3 children, 1 that lives locally, with a place in [place] , and 1 that lives in [place] .  When asked to share about her wife's passions or what she enjoys, she shared the experience of losing her husband.  Therapeutic silence, active listening, and emotional support provided.   Discussed grief and mourning after losing a loved one.  Spiritual care consult added for support as well.  As far as functional and nutritional status patient shares that she does not eat large amounts at 1 time.  Additionally, she shares she no longer walks and is bedbound.  She shares her apartment at home is at [facility] and believes she will return there after this hospitalization.  In review of patient's chart, noted that her CODE STATUS has changed from DNR/DNI to full code.  Additionally, patient with active with hospice services at onetime.  Brought both of these topics to patient's attention.  She shares that she never wanted to be a DNR or DNI.  She shares that her husband wanted this for her.  Additionally, she shares she did have hospice services at one time but no longer wants to go down that road.  Extensive discussion of code status.  Full code versus DNR with full interventions versus DNR with limited interventions reviewed in detail.  Patient endorses she would like to remain full code, accepting of a ventilator/life support if needed as well as ACLS/CPR in the event of a cardiopulmonary arrest.  In the event that patient is unable to speak for herself, she shares that her  children (3 sons) would jointly be her next of kin decision makers.  She shares that she would like to be given the chance and would not want to die naturally.  She would like to receive CPR and other life-sustaining measures.  However, if she is unable to wean off of the ventilator, she shares she is not quite sure about long-term ventilatory support, tracheostomy, PEG.  She shares that she wants to think more about this.  Encouraged patient/family to consider DNR/DNI status understanding evidenced based poor outcomes in similar hospitalized patients, as the cause of the arrest is likely associated with chronic/terminal disease rather than a reversible acute cardio-pulmonary event.    Symptoms assessed.   Patient endorses that her breathing is fine until she gets worked up and thinking about the loss of her husband.  She denies chest pain and shortness of breath at this time.  Discussed that next steps involve ruling out a pulmonary embolism versus abscess versus malignancy of her lungs.  She shares this feels like a regular COPD exacerbation but is understanding of needing to rule out other potential contributing factors.  We discussed patient's current illness and what it means in the larger context of patient's on-going co-morbidities.  I highlighted that COPD is a progressive, irreversible disease.  Treatment involves managing symptoms and extending time between exacerbations as much as possible.    I attempted to elicit values and goals of care important to the patient.  Shared she wants to continue with current plan of care.  She remains accepting of all offered, available, and appropriate medical interventions to sustain her life.  Again, patient noted that she does not want to die naturally but wants anything and everything done to try to keep her alive.  Education offered regarding concept specific to human mortality and the limitations of medical interventions to prolong life when the body begins to fail to thrive.   Discussed with patient the importance of continued conversation with family and the medical providers regarding overall plan of care and treatment options, ensuring decisions are within the context of the patient's values and GOCs.    Questions and concerns were addressed.  She shares she is going to think more about our discussion.  I shared that I plan to follow-up with her tomorrow to continue goals of care discussions.  Full code and full scope remain.  No adjustment to plan of care at this time.  Primary Decision Maker PATIENT  Physical Exam Vitals reviewed.  Constitutional:      General: She is not in acute distress.    Appearance: She is normal weight.  HENT:      Head: Normocephalic.  Cardiovascular:     Rate and Rhythm: Normal rate.  Pulmonary:     Breath sounds: Examination of the right-lower field reveals decreased breath sounds. Examination of the left-lower field reveals decreased breath sounds. Decreased breath sounds present.  Musculoskeletal:     Comments: Generalized weakness, bedbound  Skin:    General: Skin is warm and dry.  Neurological:     Mental Status: She is alert and oriented to person, place, and time.  Psychiatric:        Mood and Affect: Mood is not anxious.        Behavior: Behavior normal. Behavior is not agitated.     Palliative Assessment/Data: 30-40%     Thank you for this consult. Palliative medicine will continue to follow and assist holistically.   Time Total: 75 minutes  Time spent includes: Detailed review of medical records (labs, imaging, vital signs), medically appropriate exam (mental status, respiratory, cardiac, skin), discussed with treatment team, counseling and educating patient, family and staff, documenting clinical information, medication management and coordination of care.  Signed by: Lamarr Gunner, DNP, FNP-BC Palliative Medicine   Please contact Palliative Medicine Team providers via [facility] for questions and concerns.

## 2023-11-18 NOTE — Progress Notes (Addendum)
  Progress Note   Patient: Kristin [name] FMW:[id number] DOB: Apr 10, 1945 DOA: 11/16/2023     2 DOS: the patient was seen and examined on 11/18/2023   Brief hospital course: 78 y.o. female with medical history significant for asthma/COPD on home oxygen 4 L at baseline, DM, HTN, CVA HLD who was brought in by ambulance to [facility] ED for shortness of breath.  Patient is a long term resident of [facility]. Was on BiPAP on presentation, was transitioned to 4 L [place].  Admitted for acute on chronic hypoxic and hypercapnic respiratory failure, acute COPD exacerbation, multi focal pneumonia.  Hospital course as below  Assessment and Plan: Acute on chronic respiratory failure with hypoxia and hypercapnia (HCC) Patient came in with respiratory distress requiring BiPAP.  Continue BiPAP at night and 4 L of oxygen during the day.   Acute exacerbation of chronic obstructive pulmonary disease (COPD) Bibasilar atelectasis Continue azithromycin  Home inhalers, DuoNebs as needed Incentive spirometry S/p Solu-Medrol  changed to p.o. prednisone  Seen by pulmonary, appreciate recs Moderate risk of PE due to immobility, CT PE negative for PE, scattered patchy opacities in the peripheral lung zones likely due to atypical infection or inflammatory etiology.  New 1.8 cm nodular opacity in superior left lower lobe, with small central lucencies which may represent air bronchograms.   Multifocal CAP COVID/flu/RSV negative, respiratory viral panel negative CXR with nodular bilateral infiltrate Continue azithromycin , ceftriaxone  CRP elevated, procalcitonin down trended Legionella antibody, strep pneumoniae antigen Fungal serology, respiratory culture Follow blood culture   Uncontrolled type 2 diabetes mellitus with hyperglycemia Steroid-induced hyperglycemia Lantus  6 units, SSI   Depression On Zoloft  and clonazepam    Benign essential hypertension Continue Cozaar    Hypothyroidism, unspecified Continue  levothyroxine   Physical deconditioning PT/OT eval  Palliative care evaluation for goals of care -full code and full scope of treatment    Subjective: Patient was examined at the bedside, new to me today.  Continues to have cough and states she has shortness of breath.  Continue antibiotics and steroids per pulmonary  Physical Exam: Vitals:   11/18/23 0209 11/18/23 0400 11/18/23 0728 11/18/23 0833  BP:  (!) 149/82  (!) 133/51  Pulse: 63 62  83  Resp: 18   (!) 27  Temp:  97.9 F (36.6 C)  97.7 F (36.5 C)  TempSrc:  Oral  Oral  SpO2: 100% 100% 97% 90%  Weight:      Height:       General :NAD, no fevers, chills, no weakness no fatigue Neck: Supple. No thyromegaly. No nodules. No JVD.  Pulm: decreased breath sounds with mild rhonchi worse at bases bilaterally.  Cardio: S1 and S2. Regular rate and rhythm. No murmurs, rubs, or gallops. No edema. Pedal pulses 2+ bilaterally.  Abd: Soft, nontender, nondistended. No masses. Positive bowel sounds. No hepatosplenomegaly.  Neuro: Cranial nerves II through XII are intact. No gross focal neurological deficits. Sensation intact. Reflexes intact. Non ambulatory at baseline Skin: No ulceration, lesions, rashes, or cyanosis. Skin warm and dry    Labs reviewed  Family Communication: Spoke with son, Velinda on the phone  Disposition: Status is: Inpatient Remains inpatient appropriate because: Need to get respiratory status improved prior to disposition  Planned Discharge Destination: Long-term care    Time spent: 35 minutes  Author: Laree Lock, MD 11/18/2023 9:48 AM  For on call review [email or web address].

## 2023-11-18 NOTE — Care Management Important Message (Signed)
 Important Message  Patient Details  Name: Kristin [name] MRN: [id number] Date of Birth: 20-Feb-1946   Important Message Given:  Yes - Medicare IM     [name] 11/18/2023, 12:04 PM

## 2023-11-19 DIAGNOSIS — J9621 Acute and chronic respiratory failure with hypoxia: Secondary | ICD-10-CM | POA: Diagnosis not present

## 2023-11-19 DIAGNOSIS — J189 Pneumonia, unspecified organism: Secondary | ICD-10-CM | POA: Diagnosis not present

## 2023-11-19 DIAGNOSIS — Z515 Encounter for palliative care: Secondary | ICD-10-CM | POA: Diagnosis not present

## 2023-11-19 DIAGNOSIS — J441 Chronic obstructive pulmonary disease with (acute) exacerbation: Secondary | ICD-10-CM | POA: Diagnosis not present

## 2023-11-19 DIAGNOSIS — J9622 Acute and chronic respiratory failure with hypercapnia: Secondary | ICD-10-CM | POA: Diagnosis not present

## 2023-11-19 LAB — CBC
HCT: 31.7 % — ABNORMAL LOW (ref 36.0–46.0)
Hemoglobin: 9.9 g/dL — ABNORMAL LOW (ref 12.0–15.0)
MCH: 29.8 pg (ref 26.0–34.0)
MCHC: 31.2 g/dL (ref 30.0–36.0)
MCV: 95.5 fL (ref 80.0–100.0)
Platelets: 212 K/uL (ref 150–400)
RBC: 3.32 MIL/uL — ABNORMAL LOW (ref 3.87–5.11)
RDW: 13.4 % (ref 11.5–15.5)
WBC: 7.9 K/uL (ref 4.0–10.5)
nRBC: 0 % (ref 0.0–0.2)

## 2023-11-19 LAB — BASIC METABOLIC PANEL WITH GFR
Anion gap: 14 (ref 5–15)
BUN: 22 mg/dL (ref 8–23)
CO2: 27 mmol/L (ref 22–32)
Calcium: 8.8 mg/dL — ABNORMAL LOW (ref 8.9–10.3)
Chloride: 103 mmol/L (ref 98–111)
Creatinine, Ser: 0.85 mg/dL (ref 0.44–1.00)
GFR, Estimated: >60 mL/min (ref >60)
Glucose, Bld: 99 mg/dL (ref 70–99)
Potassium: 3.7 mmol/L (ref 3.5–5.1)
Sodium: 144 mmol/L (ref 135–145)

## 2023-11-19 LAB — GLUCOSE, CAPILLARY
Glucose-Capillary: 112 mg/dL — ABNORMAL HIGH (ref 70–99)
Glucose-Capillary: 238 mg/dL — ABNORMAL HIGH (ref 70–99)
Glucose-Capillary: 301 mg/dL — ABNORMAL HIGH (ref 70–99)
Glucose-Capillary: 167 mg/dL — ABNORMAL HIGH (ref 70–99)

## 2023-11-19 LAB — LEGIONELLA PNEUMOPHILA TOTAL AB: Legionella Pneumo Total Ab: NONREACTIVE

## 2023-11-19 MED ORDER — SENNOSIDES-DOCUSATE SODIUM 8.6-50 MG PO TABS
1.0000 | ORAL_TABLET | Freq: Two times a day (BID) | ORAL | Status: DC
  Administered 2023-11-19 – 2023-11-24 (×11): 1 via ORAL
  Filled 2023-11-19 (×11): qty 1

## 2023-11-19 MED ORDER — POLYETHYLENE GLYCOL 3350 17 G PO PACK
17.0000 g | PACK | Freq: Every day | ORAL | Status: DC
  Administered 2023-11-19 – 2023-11-24 (×6): 17 g via ORAL
  Filled 2023-11-19 (×6): qty 1

## 2023-11-19 NOTE — Consult Note (Signed)
 PULMONOLOGY         Date: 11/19/2023,   MRN# [id number] Lavanda Nevels [birth year]     AdmissionWeight: 59.1 kg                 CurrentWeight: 59.1 kg  Referring provider: Dr Ginnie   CHIEF COMPLAINT:   Acute on chronic hypoxemic respiratory failure   HISTORY OF PRESENT ILLNESS   This is 78 y.o. female from [facility] with PMH of  Asthma COPD overlap (ACOS) with chronic hypoxemia on home oxygen 4 L at baseline, DM, HTN, CVA HLD who was brought in by ambulance to [facility] ED for shortness of breath.  She was noted to have hypoxemia in mid 80s in the field on way to hospital.  She was using accessory muscles of respiration and appeared to be in distress.  She does not walk at baseline.  On arrival to ER she required BIPAP and improved.  She denies flu like illness, sick contacts, travel.  She denies any fever or chills.  She denies any nausea or vomiting.  She denies any chest pain or palpitations.  She normally sees Dr Theotis with [facility] pulmonology. Blood work shows acute on chronic anemia with hb from 11 to 9. RVP is negative.  CXR with possible pneumonia. PCCM consultation placed for additional evaluation and management.   11/19/23- s/p CT angio negative for PE.  There is 1.8cm lesion of concern at left lower lobe which may be malignancy and we will re-evaluate this via PET scan on outpatient if it persists. She is relatively doing ok from clinical perspective with stable vital signs and blood work. Plan to continue CAP therapy for now.  PAST MEDICAL HISTORY   Past Medical History:  Diagnosis Date   Asthma    Cancer (HCC) 2010   Left ear cancer. BCC   COPD (chronic obstructive pulmonary disease) (HCC)    COVID-19 05/27/2019   Diabetes mellitus without complication (HCC)    Emphysema lung (HCC)    Hypertension    Stroke [facility])      SURGICAL HISTORY   Past Surgical History:  Procedure Laterality Date   ABDOMINAL HYSTERECTOMY     BREAST BIOPSY Left 1990   neg    CHOLECYSTECTOMY     COLONOSCOPY WITH PROPOFOL  N/A 12/01/2017   Procedure: COLONOSCOPY WITH PROPOFOL ;  Surgeon: Toledo, Ladell POUR, MD;  Location: [facility] ENDOSCOPY;  Service: Gastroenterology;  Laterality: N/A;   ESOPHAGOGASTRODUODENOSCOPY N/A 12/01/2017   Procedure: ESOPHAGOGASTRODUODENOSCOPY (EGD);  Surgeon: Toledo, Ladell POUR, MD;  Location: [facility] ENDOSCOPY;  Service: Gastroenterology;  Laterality: N/A;   NECK SURGERY       FAMILY HISTORY   Family History  Problem Relation Age of Onset   Ovarian cancer Mother    Lung cancer Father    Breast cancer Paternal Aunt      SOCIAL HISTORY   Social History   Tobacco Use   Smoking status: Former    Passive exposure: Past   Smokeless tobacco: Never  Vaping Use   Vaping status: Never Used  Substance Use Topics   Alcohol use: No   Drug use: No     MEDICATIONS    Home Medication:    Current Medication:  Current Facility-Administered Medications:    acetaminophen  (TYLENOL ) tablet 650 mg, 650 mg, Oral, Q6H PRN, Paudel, Keshab, MD, 650 mg at 11/17/23 0851   albuterol  (PROVENTIL ) (2.5 MG/3ML) 0.083% nebulizer solution 2.5 mg, 2.5 mg, Nebulization, Q2H PRN, Josette Ade, MD,  2.5 mg at 11/18/23 9273   albuterol  (PROVENTIL ) (2.5 MG/3ML) 0.083% nebulizer solution 2.5 mg, 2.5 mg, Nebulization, TID, Wieting, Richard, MD, 2.5 mg at 11/19/23 0737   azithromycin  (ZITHROMAX ) 500 mg in sodium chloride  0.9 % 250 mL IVPB, 500 mg, Intravenous, Q24H, Paudel, Keshab, MD, Last Rate: 250 mL/hr at 11/19/23 0545, 500 mg at 11/19/23 0545   bisacodyl  (DULCOLAX) suppository 10 mg, 10 mg, Rectal, PRN, Paudel, Keshab, MD   budesonide -glycopyrrolate -formoterol  (BREZTRI ) 160-9-4.8 MCG/ACT inhaler 2 puff, 2 puff, Inhalation, BID, Wieting, Richard, MD, 2 puff at 11/18/23 0924   cefTRIAXone  (ROCEPHIN ) 2 g in sodium chloride  0.9 % 100 mL IVPB, 2 g, Intravenous, Q24H, Paudel, Keshab, MD, Last Rate: 200 mL/hr at 11/19/23 0510, 2 g at 11/19/23 0510   clonazePAM   (KLONOPIN ) tablet 1 mg, 1 mg, Oral, Q12H, Wieting, Richard, MD, 1 mg at 11/18/23 2203   enoxaparin  (LOVENOX ) injection 40 mg, 40 mg, Subcutaneous, Q24H, Paudel, Keshab, MD, 40 mg at 11/18/23 2204   fluticasone  (FLONASE ) 50 MCG/ACT nasal spray 2 spray, 2 spray, Each Nare, QODAY, Wieting, Richard, MD, 2 spray at 11/17/23 9170   guaiFENesin  (MUCINEX ) 12 hr tablet 600 mg, 600 mg, Oral, BID PRN, Paudel, Keshab, MD, 600 mg at 11/17/23 0851   hydrALAZINE  (APRESOLINE ) injection 10 mg, 10 mg, Intravenous, Q6H PRN, Mansy, Jan A, MD, 10 mg at 11/18/23 2009   HYDROcodone -acetaminophen  (NORCO/VICODIN) 5-325 MG per tablet 1 tablet, 1 tablet, Oral, Q4H PRN, Josette Ade, MD, 1 tablet at 11/18/23 2019   hydrOXYzine  (ATARAX ) tablet 10 mg, 10 mg, Oral, TID PRN, Ponnala, Shruthi, MD, 10 mg at 11/18/23 1837   insulin  aspart (novoLOG ) injection 0-5 Units, 0-5 Units, Subcutaneous, QHS, Paudel, Keshab, MD, 4 Units at 11/16/23 2040   insulin  aspart (novoLOG ) injection 0-6 Units, 0-6 Units, Subcutaneous, TID WC, Paudel, Keshab, MD, 1 Units at 11/18/23 1647   insulin  glargine (LANTUS ) injection 6 Units, 6 Units, Subcutaneous, Daily, Josette, Richard, MD, 6 Units at 11/18/23 1002   levothyroxine  (SYNTHROID ) tablet 112 mcg, 112 mcg, Oral, Q0600, Paudel, Keshab, MD, 112 mcg at 11/19/23 0511   loratadine  (CLARITIN ) tablet 10 mg, 10 mg, Oral, Daily, Wieting, Richard, MD, 10 mg at 11/18/23 9081   losartan  (COZAAR ) tablet 25 mg, 25 mg, Oral, Daily, Paudel, Keshab, MD, 25 mg at 11/18/23 9081   montelukast  (SINGULAIR ) tablet 10 mg, 10 mg, Oral, QHS, Wieting, Richard, MD, 10 mg at 11/18/23 2204   ondansetron  (ZOFRAN ) injection 4 mg, 4 mg, Intravenous, Q6H PRN, Paudel, Nena, MD, 4 mg at 11/17/23 2042   pantoprazole  (PROTONIX ) EC tablet 40 mg, 40 mg, Oral, BID WC, Paudel, Keshab, MD, 40 mg at 11/18/23 1355   pneumococcal 20-valent conjugate vaccine (PREVNAR 20 ) injection 0.5 mL, 0.5 mL, Intramuscular, Tomorrow-1000, Paudel, Keshab,  MD   polyethylene glycol (MIRALAX  / GLYCOLAX ) packet 17 g, 17 g, Oral, Daily, Ponnala, Shruthi, MD   pramipexole  (MIRAPEX ) tablet 0.5 mg, 0.5 mg, Oral, Q12H, Wieting, Richard, MD, 0.5 mg at 11/18/23 2206   predniSONE  (DELTASONE ) tablet 50 mg, 50 mg, Oral, Q breakfast, Ariyan Brisendine, MD   senna-docusate (Senokot-S) tablet 1 tablet, 1 tablet, Oral, BID, Ponnala, Shruthi, MD   sertraline  (ZOLOFT ) tablet 100 mg, 100 mg, Oral, Daily, Wieting, Richard, MD, 100 mg at 11/18/23 9080   traZODone  (DESYREL ) tablet 150 mg, 150 mg, Oral, QHS, Paudel, Keshab, MD, 150 mg at 11/18/23 2203    ALLERGIES   Codeine, Morphine  and codeine, and Prednisone      REVIEW OF SYSTEMS    Review  of Systems:  Gen:  Denies  fever, sweats, chills weigh loss  HEENT: Denies blurred vision, double vision, ear pain, eye pain, hearing loss, nose bleeds, sore throat Cardiac:  No dizziness, chest pain or heaviness, chest tightness,edema Resp:   reports dyspnea chronically  Gi: Denies swallowing difficulty, stomach pain, nausea or vomiting, diarrhea, constipation, bowel incontinence Gu:  Denies bladder incontinence, burning urine Ext:   Denies Joint pain, stiffness or swelling Skin: Denies  skin rash, easy bruising or bleeding or hives Endoc:  Denies polyuria, polydipsia , polyphagia or weight change Psych:   Denies depression, insomnia or hallucinations   Other:  All other systems negative   VS: BP (!) 154/62   Pulse 72   Temp 98.1 F (36.7 C) (Oral)   Resp (!) 23   Ht 5' 4 (1.626 m)   Wt 59.1 kg   SpO2 98%   BMI 22.36 kg/m      PHYSICAL EXAM    GENERAL:NAD, no fevers, chills, no weakness no fatigue HEAD: Normocephalic, atraumatic.  EYES: Pupils equal, round, reactive to light. Extraocular muscles intact. No scleral icterus.  MOUTH: Moist mucosal membrane. Dentition intact. No abscess noted.  EAR, NOSE, THROAT: Clear without exudates. No external lesions.  NECK: Supple. No thyromegaly. No nodules.  No JVD.  PULMONARY: decreased breath sounds with mild rhonchi worse at bases bilaterally.  CARDIOVASCULAR: S1 and S2. Regular rate and rhythm. No murmurs, rubs, or gallops. No edema. Pedal pulses 2+ bilaterally.  GASTROINTESTINAL: Soft, nontender, nondistended. No masses. Positive bowel sounds. No hepatosplenomegaly.  MUSCULOSKELETAL: No swelling, clubbing, or edema. Range of motion full in all extremities.  NEUROLOGIC: Cranial nerves II through XII are intact. No gross focal neurological deficits. Sensation intact. Reflexes intact.  SKIN: No ulceration, lesions, rashes, or cyanosis. Skin warm and dry. Turgor intact.  PSYCHIATRIC: Mood, affect within normal limits. The patient is awake, alert and oriented x 3. Insight, judgment intact.       IMAGING      EXAM: PORTABLE CHEST 1 VIEW   COMPARISON:  Chest radiographs 03/22/2022 and earlier.   FINDINGS: Portable AP upright view at 0449 hours. Large lung volumes. Centrilobular emphysema by CTA in 2023. Normal cardiac size and mediastinal contours. Visualized tracheal air column is within normal limits. No pneumothorax, pleural effusion or consolidation. However, compared to last year multifocal bilateral irregular peribronchial opacity appears increased. No pulmonary edema.   Prior cervical ACDF. Stable visualized osseous structures. Negative visible bowel gas.   IMPRESSION: Emphysema (ICD10-J43.9), with widespread bilateral new irregular peribronchial opacity compatible with bilateral acute infectious exacerbation. No consolidation or pleural effusion at this time.     Electronically Signed   By: VEAR Hurst M.D.   On: 11/16/2023 05:23  ASSESSMENT/PLAN    Acute on chronic hypoxemic respiratory failure    - patient seems to have moderate acute COPD exacerbation    - her viral workup is negative including COVID/FLU/RSV    - RVP 20 species is negative    - no signs of CHF with absence of LE edema, normal JVD    - CXR with  nodular bilateral infiltrate    - patient at moderate risk of PE due to immobility from bedbound state - will need to rule out PE -CTPE ordered    - possible neoplasm related lesions - CT chest PE protocol will further investigate lung parenchyma for abscess or malignancy    - ddimer    - CRP     - Procalcitonin trend    -  legionella ab     -strep pneumoniae ur ag     - fungitell serology      - resp cultures ordered    - blood cultures are negative x 1 day    Community acquired pneumonia    Multifocal infiltrate   - MRSA pCR  - zithromax /rocephin  currently   - additional micro as above   Moderate COPD exacerbation    - Continue current abx    - breztri  bid    - nebulizer therapy    - PT/OT    - Incentive spirometry    - s/p solumedrol 40mg  iv daily - may transition to prednisone  taper      Bibasilar atelectasis    PT/OT    - incentive spirometry    - will consider VEST therapy vs Metaneb if not responsive   Physical deconditioning    - patient is immobile/bedbound   - PT /OT    Goals of care    - BODE index is >8 with overall poor prognosis   - recommendation for palliative care consultation due to current full code status and may be not in line with level of aggression that patient desires.       Thank you for allowing me to participate in the care of this patient.   Patient/Family are satisfied with care plan and all questions have been answered.    Provider disclosure: Patient with at least one acute or chronic illness or injury that poses a threat to life or bodily function and is being managed actively during this encounter.  All of the below services have been performed independently by signing provider:  review of prior documentation from internal and or external health records.  Review of previous and current lab results.  Interview and comprehensive assessment during patient visit today. Review of current and previous chest radiographs/CT scans.  Discussion of management and test interpretation with health care team and patient/family.   This document was prepared using Dragon voice recognition software and may include unintentional dictation errors.     Ranette Luckadoo, M.D.  Division of Pulmonary & Critical Care Medicine

## 2023-11-19 NOTE — Progress Notes (Signed)
 Palliative Care Progress Note, Assessment & Plan   Patient Name: Kristin [name]       Date: 11/19/2023 DOB: September 14, 1945  Age: 78 y.o. MRN#: [id number] Attending Physician: Jerelene Critchley, MD Primary Care Physician: Sadie Manna, MD Admit Date: 11/16/2023  Subjective: Patient is sitting up in bed and eating her lunch.  No family or friends present at bedside.  She is awake, alert, acknowledges her presence, and is able to make her wishes known.  HPI: 78 y.o. female  with past medical history of  Asthma COPD overlap (ACOS) with chronic hypoxemia on home oxygen 4 L at baseline, DM, HTN, CVA HLD  admitted from [facility] on 11/16/2023 with shortness of breath.  Patient was hypoxemic upon arrival to the ED where she required BiPAP.   Patient is being treated for acute on chronic hypoxemic respiratory failure, community-acquired pneumonia, moderate COPD exacerbation, bibasilar atelectasis, and physical deconditioning.   PMT was consulted to support patient with goals of care discussions.   Summary of counseling/coordination of care: Extensive chart review completed prior to meeting patient including labs, vital signs, imaging, progress notes, orders, and available advanced directive documents from current and previous encounters.   After reviewing the patient's chart and assessing the patient at bedside, I spoke with patient in regards to symptom management and goals of care.   Assessed.  Patient endorses her breathing is much improved from yesterday.  She has no acute complaints such as headache or chest pain at this time.  No adjustment to [facility] needed.  We again discussed boundaries of medical treatment and goals of care.  She shares she remains unsure what she would want for herself in the event  of a cardiopulmonary arrest.  However, she wishes to discuss details of CODE STATUS options again.  Detailed discussion again had about wanting full code versus DNR with full interventions and DNR with limited interventions.  At 1 point the discussion, she shares she would never want to be placed on a ventilator.  However, she then endorses that she would never want to die and actually wants to be given every chance possible to continue to live.  Discussed difference between being kept alive and living.  She shares she thinks I want to keep my DNR.  Clarified that patient is currently a full code and has no DNR in place.  She was confused by this information and again asked what code status was. Given patient's waxing and waning nature and need for further education, full code remains until further discussion needed.  Patient endorses I can speak with her son to discuss CODE STATUS.  However, I reminded patient that she has full autonomy and agency of her medical decision making.  However, patient is weak with family that she would like her.  She shares that her son had removed her DNR in the past, would not want her to have a DNR.  Ongoing discussions to continue.  Attempted to elicit values and goals reported to the patient.  She shares she is content with returning to [facility] when she was feeling back tomorrow.  I shared that she has shown drastic improvement in her pulmonary function and her saturations  have remained normal without acute exacerbation during this hospitalization.  Patient will likely be medically stable soon to return to [employer].  I encouraged her to continue to consider creating advanced directives and making her medical boundaries and goals known.  No change to plan of care at this time.  Full code includes.  However, patient has been clear with me both yesterday and today that she would not want to be maintained long-term on machines-tracheostomy/PEG.  Physical  Exam Vitals reviewed.  Constitutional:      General: She is not in acute distress.    Appearance: She is normal weight.  HENT:     Head: Normocephalic.  Cardiovascular:     Rate and Rhythm: Normal rate.  Pulmonary:     Breath sounds: Examination of the right-lower field reveals decreased breath sounds. Examination of the left-lower field reveals decreased breath sounds. Decreased breath sounds present.  Musculoskeletal:     Comments: Generalized weakness  Neurological:     Mental Status: She is alert and oriented to person, place, and time.  Psychiatric:        Mood and Affect: Mood is not anxious.        Behavior: Behavior normal. Behavior is not agitated.             Total Time 50 minutes   Time spent includes: Detailed review of medical records (labs, imaging, vital signs), medically appropriate exam (mental status, respiratory, cardiac, skin), discussed with treatment team, counseling and educating patient, family and staff, documenting clinical information, medication management and coordination of care.  Lamarr L. Arvid, DNP, FNP-BC Palliative Medicine Team

## 2023-11-19 NOTE — Plan of Care (Signed)
   Problem: Education: Goal: Knowledge of General Education information will improve Description: Including pain rating scale, medication(s)/side effects and non-pharmacologic comfort measures Outcome: Progressing   Problem: Clinical Measurements: Goal: Respiratory complications will improve Outcome: Progressing   Problem: Activity: Goal: Risk for activity intolerance will decrease Outcome: Progressing

## 2023-11-19 NOTE — Progress Notes (Signed)
  Progress Note   Patient: Kristin [name] FMW:[id number] DOB: 20-Aug-1945 DOA: 11/16/2023     3 DOS: the patient was seen and examined on 11/19/2023   Brief hospital course: 78 y.o. female with medical history significant for asthma/COPD on home oxygen 4 L at baseline, DM, HTN, CVA HLD who was brought in by ambulance to [facility] ED for shortness of breath.  Patient is a long term resident of [facility]. Was on BiPAP on presentation, was transitioned to 4 L [place].  Admitted for acute on chronic hypoxic and hypercapnic respiratory failure, acute COPD exacerbation, multi focal pneumonia.  Hospital course as below  Assessment and Plan: Acute on chronic respiratory failure with hypoxia and hypercapnia (HCC) Patient came in with respiratory distress requiring BiPAP.  Continue BiPAP at night and 4 L of oxygen during the day.   Acute exacerbation of chronic obstructive pulmonary disease (COPD) Bibasilar atelectasis Continue azithromycin  Home inhalers, DuoNebs as needed Incentive spirometry S/p Solu-Medrol  changed to p.o. prednisone  Seen by pulmonary, appreciate recs   Multifocal CAP COVID/flu/RSV negative, respiratory viral panel negative CXR with nodular bilateral infiltrate Continue azithromycin , ceftriaxone  CRP elevated, procalcitonin down trended Legionella antibody, strep pneumoniae antigen Fungal serology, respiratory culture Follow blood culture  Nodular opacity LUL Moderate risk of PE due to immobility, CT PE negative for PE, scattered patchy opacities in the peripheral lung zones likely due to atypical infection or inflammatory etiology.  New 1.8 cm nodular opacity in superior left lower lobe, with small central lucencies which may represent air bronchograms. Pulmonary following   Uncontrolled type 2 diabetes mellitus with hyperglycemia Steroid-induced hyperglycemia Lantus  6 units, SSI   Depression On Zoloft  and clonazepam    Benign essential hypertension Continue Cozaar     Hypothyroidism, unspecified Continue levothyroxine   Constipation Senna, MiraLAX   Physical deconditioning PT/OT eval  Palliative care evaluation for goals of care -full code and full scope of treatment    Subjective: Patient was examined at the bedside, Continues to have cough and states she has shortness of breath.  Continue antibiotics and steroids per pulmonary  Physical Exam: Vitals:   11/19/23 0400 11/19/23 0600 11/19/23 0800 11/19/23 1103  BP: 134/69 (!) 154/62 (!) 134/51 (!) 143/56  Pulse: 68 72 76 80  Resp: 20 (!) 23 (!) 24 (!) 31  Temp:   98 F (36.7 C) 97.8 F (36.6 C)  TempSrc:   Oral Oral  SpO2: 98% 98% 98% 96%  Weight:      Height:       General :NAD, no fevers, chills, no weakness no fatigue Neck: Supple. No thyromegaly. No nodules. No JVD.  Pulm: decreased breath sounds with mild rhonchi worse at bases bilaterally.  Cardio: S1 and S2. Regular rate and rhythm. No murmurs, rubs, or gallops. No edema. Pedal pulses 2+ bilaterally.  Abd: Soft, nontender, nondistended. No masses. Positive bowel sounds. No hepatosplenomegaly.  Neuro: Cranial nerves II through XII are intact. No gross focal neurological deficits. Sensation intact. Reflexes intact. Non ambulatory at baseline Skin: No ulceration, lesions, rashes, or cyanosis. Skin warm and dry    Labs reviewed  Family Communication: Discussed with patient at the bedside and called Son Velinda and provided updates  Disposition: Status is: Inpatient Remains inpatient appropriate because: Need to get respiratory status improved prior to disposition  Planned Discharge Destination: Long-term care    Time spent: 35 minutes  Author: Laree Lock, MD 11/19/2023 5:43 PM  For on call review [email or web address].

## 2023-11-20 DIAGNOSIS — J9622 Acute and chronic respiratory failure with hypercapnia: Secondary | ICD-10-CM | POA: Diagnosis not present

## 2023-11-20 DIAGNOSIS — J9621 Acute and chronic respiratory failure with hypoxia: Secondary | ICD-10-CM | POA: Diagnosis not present

## 2023-11-20 DIAGNOSIS — J189 Pneumonia, unspecified organism: Secondary | ICD-10-CM | POA: Diagnosis not present

## 2023-11-20 DIAGNOSIS — R6520 Severe sepsis without septic shock: Secondary | ICD-10-CM

## 2023-11-20 DIAGNOSIS — A419 Sepsis, unspecified organism: Secondary | ICD-10-CM | POA: Diagnosis not present

## 2023-11-20 DIAGNOSIS — J441 Chronic obstructive pulmonary disease with (acute) exacerbation: Secondary | ICD-10-CM | POA: Diagnosis not present

## 2023-11-20 LAB — CBC
HCT: 28.9 % — ABNORMAL LOW (ref 36.0–46.0)
Hemoglobin: 9.2 g/dL — ABNORMAL LOW (ref 12.0–15.0)
MCH: 29.6 pg (ref 26.0–34.0)
MCHC: 31.8 g/dL (ref 30.0–36.0)
MCV: 92.9 fL (ref 80.0–100.0)
Platelets: 200 K/uL (ref 150–400)
RBC: 3.11 MIL/uL — ABNORMAL LOW (ref 3.87–5.11)
RDW: 13.2 % (ref 11.5–15.5)
WBC: 7 K/uL (ref 4.0–10.5)
nRBC: 0 % (ref 0.0–0.2)

## 2023-11-20 LAB — GLUCOSE, CAPILLARY
Glucose-Capillary: 110 mg/dL — ABNORMAL HIGH (ref 70–99)
Glucose-Capillary: 294 mg/dL — ABNORMAL HIGH (ref 70–99)
Glucose-Capillary: 275 mg/dL — ABNORMAL HIGH (ref 70–99)
Glucose-Capillary: 205 mg/dL — ABNORMAL HIGH (ref 70–99)

## 2023-11-20 LAB — BASIC METABOLIC PANEL WITH GFR
Anion gap: 11 (ref 5–15)
BUN: 15 mg/dL (ref 8–23)
CO2: 30 mmol/L (ref 22–32)
Calcium: 8.9 mg/dL (ref 8.9–10.3)
Chloride: 101 mmol/L (ref 98–111)
Creatinine, Ser: 0.74 mg/dL (ref 0.44–1.00)
GFR, Estimated: >60 mL/min (ref >60)
Glucose, Bld: 131 mg/dL — ABNORMAL HIGH (ref 70–99)
Potassium: 3.8 mmol/L (ref 3.5–5.1)
Sodium: 142 mmol/L (ref 135–145)

## 2023-11-20 MED ORDER — INSULIN ASPART 100 UNIT/ML IJ SOLN
2.0000 [IU] | Freq: Three times a day (TID) | INTRAMUSCULAR | Status: DC
  Administered 2023-11-21 – 2023-11-24 (×11): 2 [IU] via SUBCUTANEOUS
  Filled 2023-11-20 (×11): qty 1

## 2023-11-20 NOTE — Progress Notes (Signed)
 Palliative Care Progress Note, Assessment & Plan   Patient Name: Kristin [name]       Date: 11/20/2023 DOB: [birth year]  Age: 78 y.o. MRN#: [id number] Attending Physician: Jerelene Critchley, MD Primary Care Physician: Sadie Manna, MD Admit Date: 11/16/2023  Subjective: Patient is sitting in bed, awake, alert, and oriented x 4.  She acknowledges my presence and is able to make her wishes known.  No family or friends present at bedside during my visit.  HPI: 78 y.o. 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  Patient was hypoxemic upon arrival to the ED where she required BiPAP.   Patient is being treated for acute on chronic hypoxemic respiratory failure, community-acquired pneumonia, moderate COPD exacerbation, bibasilar atelectasis, and physical deconditioning.   PMT was consulted to support patient with goals of care discussions.  Summary of counseling/coordination of care: Extensive chart review completed prior to meeting patient including labs, vital signs, imaging, progress notes, orders, and available advanced directive documents from current and previous encounters.   After reviewing the patient's chart and assessing the patient at bedside, I spoke with patient in regards to symptom management and goals of care.   Symptoms assessed.  Patient denies shortness of breath, chest tightness or pain, discomfort, and other acute issues at this time.  No adjustment to [facility] needed.  Discussed significance of nutrition and food for fuel and not for fun.  Patient shares she does not have much appetite.  We discussed options on food menu that  sounded good to her.  Discussed significance of nutrition as a contributing factor to patient's overall prognosis in addition to her functional and cognitive status.   I again highlighted our previous discussions in regards to boundaries of treatment.  Patient shares she wants to leave it like it is.  I shared that patient remains a full code.  Patient endorses she is accepting of all offered, available, and appropriate medical interventions to sustain her life with the caveat that she would never want to be kept alive long-term with ventilatory/tracheostomy/PEG tube.  Patient shares that she would like to feel better.  Discussed physical, emotional, spiritual, and mental wellbeing as contributing factors to patient's overall wellbeing.  Discussed patient's grief and losing her husband 5 months ago. She was grateful for discussing and acknowledging her loss and shares that she is trying to process it.  She also shares she recognizes that her grief is probably manifesting itself in some physical weight as well.  Therapeutic silence, active listening, and emotional support provided.    Symptom burden is low.  Goals are clear.  PMT will step back from daily visits and monitor the patient peripherally.  Please re-engage with PMT if goals change, at patient/family's request, or if patient's health deteriorates during hospitalization.    Physical Exam Vitals reviewed.  Constitutional:      General: She is not in acute distress.    Appearance: She is normal weight. She is not ill-appearing.  HENT:     Head: Normocephalic.  Cardiovascular:  Rate and Rhythm: Normal rate.  Skin:    General: Skin is warm and dry.  Neurological:     Mental Status: She is alert and oriented to person, place, and time.  Psychiatric:        Mood and Affect: Mood is not anxious.        Behavior: Behavior normal. Behavior is not agitated.             Total Time 35 minutes   Time spent includes: Detailed review of  medical records (labs, imaging, vital signs), medically appropriate exam (mental status, respiratory, cardiac, skin), discussed with treatment team, counseling and educating patient, family and staff, documenting clinical information, medication management and coordination of care.  Lamarr L. Arvid, DNP, FNP-BC Palliative Medicine Team

## 2023-11-20 NOTE — Progress Notes (Signed)
 Progress Note   Patient: Kristin [name] FMW:[id number] DOB: [birth year] DOA: 11/16/2023     4 DOS: the patient was seen and examined on 11/20/2023   Brief hospital course: 78 y.o. female with medical history significant for asthma/COPD on home oxygen 4 L at baseline, DM, HTN, CVA HLD who was brought in by ambulance to [facility] ED for shortness of breath.  Patient is a long term resident of [facility]. Was on BiPAP on presentation, was transitioned to 4 L [place].  Admitted for acute on chronic hypoxic and hypercapnic respiratory failure, acute COPD exacerbation, multi focal pneumonia.  Hospital course as below  Assessment and Plan: Acute on chronic respiratory failure with hypoxia and hypercapnia (HCC) Patient came in with respiratory distress requiring BiPAP.  Continue BiPAP at night and 4 L of oxygen during the day.   Acute exacerbation of chronic obstructive pulmonary disease (COPD) Bibasilar atelectasis Continue azithromycin  Home inhalers, DuoNebs as needed Incentive spirometry S/p Solu-Medrol  changed to p.o. prednisone  50mg  Seen by pulmonary, appreciate recs   Multifocal CAP COVID/flu/RSV negative, respiratory viral panel negative CXR with nodular bilateral infiltrate Legionella negative Continue azithromycin , ceftriaxone  CRP elevated, procalcitonin down trended strep pneumoniae antigen pending Fungal serology, respiratory culture Follow blood culture  Nodular opacity LUL CT PE negative for PE, scattered patchy opacities in the peripheral lung zones likely due to atypical infection or inflammatory etiology.   New 1.8 cm nodular opacity in superior left lower lobe, with small central lucencies which may represent air bronchograms, possible malignancy. Plan for PET scanning on outpatient basis   Uncontrolled type 2 diabetes mellitus with hyperglycemia Steroid-induced hyperglycemia Lantus  6 units, SSI Add NovoLog  2 units TID   Depression On Zoloft  and clonazepam    Benign  essential hypertension Continue Cozaar    Hypothyroidism, unspecified Continue levothyroxine   Constipation Senna, MiraLAX   Physical deconditioning PT/OT eval  Palliative care evaluation for goals of care -full code and full scope of treatment    Subjective: Patient was examined at the bedside, Continues to have cough and states she has shortness of breath.  Continue antibiotics and steroids per pulmonary Recommended outpatient PET for possible malignancy  Physical Exam: Vitals:   11/20/23 1158 11/20/23 1200 11/20/23 1520 11/20/23 1600  BP: (!) 131/53 (!) 131/53 (!) 145/62 (!) 148/74  Pulse:  77 73 73  Resp:  19 18 18   Temp: 98.3 F (36.8 C)  98 F (36.7 C)   TempSrc: Oral  Oral   SpO2:  96% 96% 97%  Weight:      Height:       General :NAD, no fevers, chills, no weakness no fatigue Neck: Supple. No thyromegaly. No nodules. No JVD.  Pulm: decreased breath sounds with mild rhonchi worse at bases bilaterally.  Cardio: S1 and S2. Regular rate and rhythm. No murmurs, rubs, or gallops. No edema. Pedal pulses 2+ bilaterally.  Abd: Soft, nontender, nondistended. No masses. Positive bowel sounds. No hepatosplenomegaly.  Neuro: Cranial nerves II through XII are intact. No gross focal neurological deficits. Sensation intact. Reflexes intact. Non ambulatory at baseline Skin: No ulceration, lesions, rashes, or cyanosis. Skin warm and dry    Labs reviewed  Family Communication: Discussed with patient at the bedside and called Son Velinda and provided updates  Disposition: Status is: Inpatient Remains inpatient appropriate because: Need to get respiratory status improved prior to disposition  Planned Discharge Destination: Long-term care    Time spent: 35 minutes  Author: Laree Lock, MD 11/20/2023 7:30 PM  For on call review  [email or web address]

## 2023-11-20 NOTE — TOC Progression Note (Signed)
 Transition of Care [facility]) - Progression Note    Patient Details  Name: Kristin [name] MRN: [id number] Date of Birth: 04-22-1945  Transition of Care [facility]) CM/SW Contact  Lauraine [name], LCSW Phone Number: 11/20/2023, 4:03 PM  Clinical Narrative:  TOC continues to follow for return to [facility].   Expected Discharge Plan: Skilled Nursing Facility Barriers to Discharge: Continued Medical Work up               Expected Discharge Plan and Services     Post Acute Care Choice: Resumption of Svcs/PTA Provider Living arrangements for the past 2 months: Skilled Nursing Facility                                       Social Drivers of Health (SDOH) Interventions SDOH Screenings   Food Insecurity: No Food Insecurity (11/16/2023)  Housing: Low Risk  (11/16/2023)  Transportation Needs: No Transportation Needs (11/16/2023)  Utilities: Not At Risk (11/16/2023)  Financial Resource Strain: Low Risk  (05/13/2023)   Received from [facility]  Physical Activity: Inactive (12/29/2019)   Received from [facility]  Social Connections: Socially Isolated (11/16/2023)  Stress: Stress Concern Present (12/29/2019)   Received from [facility]  Tobacco Use: Medium Risk (11/16/2023)    Readmission Risk Interventions    05/06/2021   10:38 AM  Readmission Risk Prevention Plan  Transportation Screening Complete  PCP or Specialist Appt within 5-7 Days Complete  Home Care Screening Complete  Medication Review (RN CM) Complete

## 2023-11-20 NOTE — Progress Notes (Signed)
 PULMONOLOGY         Date: 11/20/2023,   MRN# [id number] Kristin [name] 19-May-1945     AdmissionWeight: 59.1 kg                 CurrentWeight: 59.1 kg  Referring provider: Dr Ginnie   CHIEF COMPLAINT:   Acute on chronic hypoxemic respiratory failure   HISTORY OF PRESENT ILLNESS   This is 78 y.o. 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  She was noted to have hypoxemia in mid 80s in the field on way to hospital.  She was using accessory muscles of respiration and appeared to be in distress.  She does not walk at baseline.  On arrival to ER she required BIPAP and improved.  She denies flu like illness, sick contacts, travel.  She denies any fever or chills.  She denies any nausea or vomiting.  She denies any chest pain or palpitations.  She normally sees Dr Theotis with [facility] pulmonology. Blood work shows acute on chronic anemia with hb from 11 to 9. RVP is negative.  CXR with possible pneumonia. PCCM consultation placed for additional evaluation and management.   11/20/23- patient was unable to get up and do anything out of bed. We discussed potential malignancy that is additional etiology of constitutional symptoms and fatigue.  Patient is on IV zithromax  and we are tapering her prednisone .  Overall trending towards clinical improvement.   PAST MEDICAL HISTORY   Past Medical History:  Diagnosis Date   Asthma    Cancer (HCC) 2010   Left ear cancer. BCC   COPD (chronic obstructive pulmonary disease) (HCC)    COVID-19 05/27/2019   Diabetes mellitus without complication (HCC)    Emphysema lung (HCC)    Hypertension    Stroke [facility])      SURGICAL HISTORY   Past Surgical History:  Procedure Laterality Date   ABDOMINAL HYSTERECTOMY     BREAST BIOPSY Left 1990   neg   CHOLECYSTECTOMY     COLONOSCOPY  WITH PROPOFOL  N/A 12/01/2017   Procedure: COLONOSCOPY WITH PROPOFOL ;  Surgeon: Toledo, Ladell POUR, MD;  Location: [facility] ENDOSCOPY;  Service: Gastroenterology;  Laterality: N/A;   ESOPHAGOGASTRODUODENOSCOPY N/A 12/01/2017   Procedure: ESOPHAGOGASTRODUODENOSCOPY (EGD);  Surgeon: Toledo, Ladell POUR, MD;  Location: [facility] ENDOSCOPY;  Service: Gastroenterology;  Laterality: N/A;   NECK SURGERY       FAMILY HISTORY   Family History  Problem Relation Age of Onset   Ovarian cancer Mother    Lung cancer Father    Breast cancer Paternal Aunt      SOCIAL HISTORY   Social History   Tobacco Use   Smoking status: Former    Passive exposure: Past   Smokeless tobacco: Never  Vaping Use   Vaping status: Never Used  Substance Use Topics   Alcohol use: No   Drug use: No     MEDICATIONS    Home Medication:    Current Medication:  Current Facility-Administered Medications:    acetaminophen  (TYLENOL ) tablet 650 mg, 650 mg, Oral, Q6H PRN, Paudel, Keshab, MD, 650 mg at 11/17/23 0851   albuterol  (PROVENTIL ) (2.5 MG/3ML) 0.083% nebulizer solution 2.5 mg, 2.5 mg, Nebulization, Q2H PRN, Josette, Richard, MD, 2.5 mg at 11/20/23 0517   albuterol  (PROVENTIL ) (2.5 MG/3ML) 0.083%  nebulizer solution 2.5 mg, 2.5 mg, Nebulization, TID, Wieting, Richard, MD, 2.5 mg at 11/20/23 0746   azithromycin  (ZITHROMAX ) 500 mg in sodium chloride  0.9 % 250 mL IVPB, 500 mg, Intravenous, Q24H, Paudel, Keshab, MD, Last Rate: 250 mL/hr at 11/20/23 0526, 500 mg at 11/20/23 0526   bisacodyl  (DULCOLAX) suppository 10 mg, 10 mg, Rectal, PRN, Paudel, Keshab, MD, 10 mg at 11/20/23 9385   budesonide -glycopyrrolate -formoterol  (BREZTRI ) 160-9-4.8 MCG/ACT inhaler 2 puff, 2 puff, Inhalation, BID, Wieting, Richard, MD, 2 puff at 11/20/23 0830   cefTRIAXone  (ROCEPHIN ) 2 g in sodium chloride  0.9 % 100 mL IVPB, 2 g, Intravenous, Q24H, Paudel, Keshab, MD, Last Rate: 200 mL/hr at 11/20/23 0511, 2 g at 11/20/23 0511   clonazePAM  (KLONOPIN )  tablet 1 mg, 1 mg, Oral, Q12H, Wieting, Richard, MD, 1 mg at 11/20/23 9171   enoxaparin  (LOVENOX ) injection 40 mg, 40 mg, Subcutaneous, Q24H, Paudel, Keshab, MD, 40 mg at 11/19/23 2133   fluticasone  (FLONASE ) 50 MCG/ACT nasal spray 2 spray, 2 spray, Each Nare, QODAY, Wieting, Richard, MD, 2 spray at 11/19/23 0900   guaiFENesin  (MUCINEX ) 12 hr tablet 600 mg, 600 mg, Oral, BID PRN, Paudel, Keshab, MD, 600 mg at 11/17/23 0851   hydrALAZINE  (APRESOLINE ) injection 10 mg, 10 mg, Intravenous, Q6H PRN, Mansy, Jan A, MD, 10 mg at 11/20/23 0004   HYDROcodone -acetaminophen  (NORCO/VICODIN) 5-325 MG per tablet 1 tablet, 1 tablet, Oral, Q4H PRN, Josette Ade, MD, 1 tablet at 11/20/23 0702   hydrOXYzine  (ATARAX ) tablet 10 mg, 10 mg, Oral, TID PRN, Ponnala, Shruthi, MD, 10 mg at 11/19/23 0856   insulin  aspart (novoLOG ) injection 0-5 Units, 0-5 Units, Subcutaneous, QHS, Paudel, Keshab, MD, 4 Units at 11/16/23 2040   insulin  aspart (novoLOG ) injection 0-6 Units, 0-6 Units, Subcutaneous, TID WC, Paudel, Keshab, MD, 3 Units at 11/20/23 1207   insulin  glargine (LANTUS ) injection 6 Units, 6 Units, Subcutaneous, Daily, Wieting, Richard, MD, 6 Units at 11/20/23 0830   levothyroxine  (SYNTHROID ) tablet 112 mcg, 112 mcg, Oral, Q0600, Paudel, Keshab, MD, 112 mcg at 11/20/23 0508   loratadine  (CLARITIN ) tablet 10 mg, 10 mg, Oral, Daily, Wieting, Richard, MD, 10 mg at 11/20/23 9171   losartan  (COZAAR ) tablet 25 mg, 25 mg, Oral, Daily, Paudel, Keshab, MD, 25 mg at 11/20/23 9171   montelukast  (SINGULAIR ) tablet 10 mg, 10 mg, Oral, QHS, Wieting, Richard, MD, 10 mg at 11/19/23 2139   ondansetron  (ZOFRAN ) injection 4 mg, 4 mg, Intravenous, Q6H PRN, Paudel, Keshab, MD, 4 mg at 11/20/23 1206   pantoprazole  (PROTONIX ) EC tablet 40 mg, 40 mg, Oral, BID WC, Paudel, Keshab, MD, 40 mg at 11/20/23 1206   pneumococcal 20-valent conjugate vaccine (PREVNAR 20 ) injection 0.5 mL, 0.5 mL, Intramuscular, Tomorrow-1000, Paudel, Keshab, MD    polyethylene glycol (MIRALAX  / GLYCOLAX ) packet 17 g, 17 g, Oral, Daily, Ponnala, Shruthi, MD, 17 g at 11/20/23 0827   pramipexole  (MIRAPEX ) tablet 0.5 mg, 0.5 mg, Oral, Q12H, Wieting, Richard, MD, 0.5 mg at 11/20/23 9171   predniSONE  (DELTASONE ) tablet 50 mg, 50 mg, Oral, Q breakfast, Rayme Bui, MD, 50 mg at 11/20/23 9171   senna-docusate (Senokot-S) tablet 1 tablet, 1 tablet, Oral, BID, Ponnala, Shruthi, MD, 1 tablet at 11/20/23 9171   sertraline  (ZOLOFT ) tablet 100 mg, 100 mg, Oral, Daily, Wieting, Richard, MD, 100 mg at 11/20/23 9171   traZODone  (DESYREL ) tablet 150 mg, 150 mg, Oral, QHS, Paudel, Keshab, MD, 150 mg at 11/19/23 2133    ALLERGIES   Codeine, Morphine  and codeine, and Prednisone   REVIEW OF SYSTEMS    Review of Systems:  Gen:  Denies  fever, sweats, chills weigh loss  HEENT: Denies blurred vision, double vision, ear pain, eye pain, hearing loss, nose bleeds, sore throat Cardiac:  No dizziness, chest pain or heaviness, chest tightness,edema Resp:   reports dyspnea chronically  Gi: Denies swallowing difficulty, stomach pain, nausea or vomiting, diarrhea, constipation, bowel incontinence Gu:  Denies bladder incontinence, burning urine Ext:   Denies Joint pain, stiffness or swelling Skin: Denies  skin rash, easy bruising or bleeding or hives Endoc:  Denies polyuria, polydipsia , polyphagia or weight change Psych:   Denies depression, insomnia or hallucinations   Other:  All other systems negative   VS: BP (!) 131/53 (BP Location: Left Arm)   Pulse 71   Temp 98.3 F (36.8 C) (Oral)   Resp 20   Ht 5' 4 (1.626 m)   Wt 59.1 kg   SpO2 100%   BMI 22.36 kg/m      PHYSICAL EXAM    GENERAL:NAD, no fevers, chills, no weakness no fatigue HEAD: Normocephalic, atraumatic.  EYES: Pupils equal, round, reactive to light. Extraocular muscles intact. No scleral icterus.  MOUTH: Moist mucosal membrane. Dentition intact. No abscess noted.  EAR, NOSE, THROAT:  Clear without exudates. No external lesions.  NECK: Supple. No thyromegaly. No nodules. No JVD.  PULMONARY: decreased breath sounds with mild rhonchi worse at bases bilaterally.  CARDIOVASCULAR: S1 and S2. Regular rate and rhythm. No murmurs, rubs, or gallops. No edema. Pedal pulses 2+ bilaterally.  GASTROINTESTINAL: Soft, nontender, nondistended. No masses. Positive bowel sounds. No hepatosplenomegaly.  MUSCULOSKELETAL: No swelling, clubbing, or edema. Range of motion full in all extremities.  NEUROLOGIC: Cranial nerves II through XII are intact. No gross focal neurological deficits. Sensation intact. Reflexes intact.  SKIN: No ulceration, lesions, rashes, or cyanosis. Skin warm and dry. Turgor intact.  PSYCHIATRIC: Mood, affect within normal limits. The patient is awake, alert and oriented x 3. Insight, judgment intact.       IMAGING      EXAM: PORTABLE CHEST 1 VIEW   COMPARISON:  Chest radiographs 03/22/2022 and earlier.   FINDINGS: Portable AP upright view at 0449 hours. Large lung volumes. Centrilobular emphysema by CTA in 2023. Normal cardiac size and mediastinal contours. Visualized tracheal air column is within normal limits. No pneumothorax, pleural effusion or consolidation. However, compared to last year multifocal bilateral irregular peribronchial opacity appears increased. No pulmonary edema.   Prior cervical ACDF. Stable visualized osseous structures. Negative visible bowel gas.   IMPRESSION: Emphysema (ICD10-J43.9), with widespread bilateral new irregular peribronchial opacity compatible with bilateral acute infectious exacerbation. No consolidation or pleural effusion at this time.     Electronically Signed   By: VEAR Hurst M.D.   On: 11/16/2023 05:23  ASSESSMENT/PLAN    Acute on chronic hypoxemic respiratory failure    - patient seems to have moderate acute COPD exacerbation    - her viral workup is negative including COVID/FLU/RSV    - RVP 20 species  is negative    - no signs of CHF with absence of LE edema, normal JVD    - CXR with nodular bilateral infiltrate    - patient at moderate risk of PE due to immobility from bedbound state - will need to rule out PE -CTPE ordered    - possible neoplasm related lesions - CT chest PE -reviewed with patient     - CRP mildy elevated      - resp  cultures ordered    - blood cultures are negative x 1 day    Community acquired pneumonia    Multifocal infiltrate   - MRSA pCR  - modified abx to PO augmentin   - additional micro as above   Moderate COPD exacerbation    - Continue current abx    - breztri  bid    - nebulizer therapy    - PT/OT    - Incentive spirometry    - on prednisone       Bibasilar atelectasis    PT/OT    - incentive spirometry    - will consider VEST therapy vs Metaneb if not responsive   Physical deconditioning    - patient is immobile/bedbound   - PT /OT    Goals of care    - BODE index is >8 with overall poor prognosis   - recommendation for palliative care consultation due to current full code status and may be not in line with level of aggression that patient desires.       Thank you for allowing me to participate in the care of this patient.   Patient/Family are satisfied with care plan and all questions have been answered.    Provider disclosure: Patient with at least one acute or chronic illness or injury that poses a threat to life or bodily function and is being managed actively during this encounter.  All of the below services have been performed independently by signing provider:  review of prior documentation from internal and or external health records.  Review of previous and current lab results.  Interview and comprehensive assessment during patient visit today. Review of current and previous chest radiographs/CT scans. Discussion of management and test interpretation with health care team and patient/family.   This document was prepared  using Dragon voice recognition software and may include unintentional dictation errors.     Malikah Lakey, M.D.  Division of Pulmonary & Critical Care Medicine

## 2023-11-20 NOTE — Plan of Care (Signed)

## 2023-11-20 NOTE — Plan of Care (Signed)
   Problem: Education: Goal: Knowledge of General Education information will improve Description: Including pain rating scale, medication(s)/side effects and non-pharmacologic comfort measures Outcome: Progressing   Problem: Clinical Measurements: Goal: Respiratory complications will improve Outcome: Progressing   Problem: Activity: Goal: Risk for activity intolerance will decrease Outcome: Progressing

## 2023-11-20 NOTE — Inpatient Diabetes Management (Signed)
 Inpatient Diabetes Program Recommendations  AACE/ADA: New Consensus Statement on Inpatient Glycemic Control   Target Ranges:  Prepandial:   less than 140 mg/dL      Peak postprandial:   less than 180 mg/dL (1-2 hours)      Critically ill patients:  140 - 180 mg/dL    Latest Reference Range & Units 11/19/23 08:32 11/19/23 11:37 11/19/23 17:31 11/19/23 20:53 11/20/23 07:55  Glucose-Capillary 70 - 99 mg/dL 887 (H) 761 (H) 698 (H) 167 (H) 110 (H)    Latest Reference Range & Units 11/16/23 10:35  Hemoglobin A1C 4.8 - 5.6 % 6.1 (H)   Review of Glycemic Control  Diabetes history: DM2 Outpatient Diabetes medications: None Current orders for Inpatient glycemic control: Lantus  6 units daily, Novolog  0-6 units TID with meals, Novolog  0-5 units at bedtime; Prednisone  50 mg QAM  Inpatient Diabetes Program Recommendations:    Insulin : If steroids are continued, may want to consider ordering Novolog  2 units TID with meals for meal coverage if patient eats at least 50% of meals.  Thanks, Earnie Gainer, RN, MSN, CDCES Diabetes Coordinator Inpatient Diabetes Program [phone] (Team Pager from 8am to 5pm)

## 2023-11-21 DIAGNOSIS — Z515 Encounter for palliative care: Secondary | ICD-10-CM | POA: Diagnosis not present

## 2023-11-21 DIAGNOSIS — J9621 Acute and chronic respiratory failure with hypoxia: Secondary | ICD-10-CM | POA: Diagnosis not present

## 2023-11-21 DIAGNOSIS — J441 Chronic obstructive pulmonary disease with (acute) exacerbation: Secondary | ICD-10-CM | POA: Diagnosis not present

## 2023-11-21 DIAGNOSIS — J189 Pneumonia, unspecified organism: Secondary | ICD-10-CM | POA: Diagnosis not present

## 2023-11-21 LAB — GLUCOSE, CAPILLARY
Glucose-Capillary: 102 mg/dL — ABNORMAL HIGH (ref 70–99)
Glucose-Capillary: 175 mg/dL — ABNORMAL HIGH (ref 70–99)
Glucose-Capillary: 174 mg/dL — ABNORMAL HIGH (ref 70–99)
Glucose-Capillary: 128 mg/dL — ABNORMAL HIGH (ref 70–99)

## 2023-11-21 LAB — IRON AND TIBC
Iron: 62 ug/dL (ref 28–170)
Saturation Ratios: 26 % (ref 10.4–31.8)
TIBC: 242 ug/dL — ABNORMAL LOW (ref 250–450)
UIBC: 180 ug/dL

## 2023-11-21 LAB — CULTURE, BLOOD (ROUTINE X 2)
Culture: NO GROWTH
Culture: NO GROWTH
Special Requests: ADEQUATE

## 2023-11-21 LAB — FOLATE: Folate: 7.2 ng/mL (ref >5.9)

## 2023-11-21 LAB — FERRITIN: Ferritin: 69 ng/mL (ref 11–307)

## 2023-11-21 LAB — VITAMIN B12: Vitamin B-12: 283 pg/mL (ref 180–914)

## 2023-11-21 MED ORDER — AMOXICILLIN-POT CLAVULANATE 875-125 MG PO TABS
1.0000 | ORAL_TABLET | Freq: Two times a day (BID) | ORAL | Status: DC
  Administered 2023-11-21 – 2023-11-23 (×5): 1 via ORAL
  Filled 2023-11-21 (×5): qty 1

## 2023-11-21 MED ORDER — PREDNISONE 50 MG PO TABS
45.0000 mg | ORAL_TABLET | Freq: Every day | ORAL | Status: DC
  Administered 2023-11-22 – 2023-11-23 (×2): 45 mg via ORAL
  Filled 2023-11-21 (×2): qty 1

## 2023-11-21 NOTE — Plan of Care (Signed)

## 2023-11-21 NOTE — Progress Notes (Addendum)
 Progress Note   Patient: Kristin [name] FMW:[id number] DOB: 1946/01/29 DOA: 11/16/2023     5 DOS: the patient was seen and examined on 11/21/2023   Brief hospital course: 78 y.o. female with medical history significant for asthma/COPD on home oxygen 4 L at baseline, DM, HTN, CVA HLD who was brought in by ambulance to [facility] ED for shortness of breath.  Patient is a long term resident of [facility]. Was on BiPAP on presentation, was transitioned to 4 L [place].  Admitted for acute on chronic hypoxic and hypercapnic respiratory failure, acute COPD exacerbation, multi focal pneumonia.  Hospital course as below  Assessment and Plan: Acute on chronic respiratory failure with hypoxia and hypercapnia (HCC) Patient came in with respiratory distress requiring BiPAP.  Continue BiPAP at night and 4 L of oxygen during the day.   Acute exacerbation of chronic obstructive pulmonary disease (COPD) Bibasilar atelectasis Continue azithromycin  Home inhalers, DuoNebs as needed Incentive spirometry S/p Solu-Medrol  changed to p.o. prednisone  45mg  Seen by pulmonary, appreciate recs   Multifocal CAP COVID/flu/RSV negative, respiratory viral panel negative CXR with nodular bilateral infiltrate Legionella negative Was on ceftriaxone , azithromycin , transition to p.o. Augmentin  CRP elevated, procalcitonin down trended strep pneumoniae antigen pending Fungal serology, respiratory culture Follow blood culture  Nodular opacity LUL CT PE negative for PE, scattered patchy opacities in the peripheral lung zones likely due to atypical infection or inflammatory etiology.   New 1.8 cm nodular opacity in superior left lower lobe, with small central lucencies which may represent air bronchograms, possible malignancy. Plan for PET scanning on outpatient basis  Chornic Anemia - Hb on presentation 11.1, s/p IV fluids now stable around 9.  Suspect hemoconcentrated on arrival - Iron studies consistent with ACD, FA  normal - pending B12, follow-up outpatient   Uncontrolled type 2 diabetes mellitus with hyperglycemia Steroid-induced hyperglycemia Lantus  6 units, SSI Add NovoLog  2 units TID   Depression On Zoloft  and clonazepam    Benign essential hypertension Continue Cozaar    Hypothyroidism, unspecified Continue levothyroxine   Constipation Senna, MiraLAX   Physical deconditioning PT/OT eval  Palliative care evaluation for goals of care -full code and full scope of treatment Palliative referral outpatient Risk of readmission    Subjective: Patient was examined at the bedside, Continues to have cough and states she has shortness of breath and headache.  Continue antibiotics and steroids per pulmonary  Physical Exam: Vitals:   11/21/23 0004 11/21/23 0426 11/21/23 0654 11/21/23 0730  BP: (!) 159/70 122/62    Pulse: 71 (!) 59    Resp: (!) 25 19    Temp: 98.2 F (36.8 C) 98 F (36.7 C)  97.9 F (36.6 C)  TempSrc: Oral Oral  Oral  SpO2: 96% 99% 97%   Weight:      Height:       General :NAD, no fevers, chills, no weakness no fatigue Neck: Supple. No thyromegaly. No nodules. No JVD.  Pulm: decreased breath sounds with mild rhonchi worse at bases bilaterally.  Cardio: S1 and S2. Regular rate and rhythm. No murmurs, rubs, or gallops. No edema. Pedal pulses 2+ bilaterally.  Abd: Soft, nontender, nondistended. No masses. Positive bowel sounds. No hepatosplenomegaly.  Neuro: Cranial nerves II through XII are intact. No gross focal neurological deficits. Sensation intact. Reflexes intact. Non ambulatory at baseline Skin: No ulceration, lesions, rashes, or cyanosis. Skin warm and dry    Labs reviewed  Family Communication: Discussed with patient at the bedside and called Son Velinda and provided updates  Disposition:  Status is: Inpatient Remains inpatient appropriate because: Need to get respiratory status improved prior to disposition  Planned Discharge Destination: Long-term  care    Time spent: 35 minutes  Author: Laree Lock, MD 11/21/2023 9:59 AM  For on call review [email or web address].

## 2023-11-21 NOTE — Progress Notes (Signed)
 PULMONOLOGY         Date: 11/21/2023,   MRN# [id number] Kristin [name] [birth year]     AdmissionWeight: 59.1 kg                 CurrentWeight: 59.1 kg  Referring provider: Dr Ginnie   CHIEF COMPLAINT:   Acute on chronic hypoxemic respiratory failure   HISTORY OF PRESENT ILLNESS   This is 78 y.o. female from [facility] with PMH of  Asthma COPD overlap (ACOS) with chronic hypoxemia on home oxygen 4 L at baseline, DM, HTN, CVA HLD who was brought in by ambulance to [facility] ED for shortness of breath.  She was noted to have hypoxemia in mid 80s in the field on way to hospital.  She was using accessory muscles of respiration and appeared to be in distress.  She does not walk at baseline.  On arrival to ER she required BIPAP and improved.  She denies flu like illness, sick contacts, travel.  She denies any fever or chills.  She denies any nausea or vomiting.  She denies any chest pain or palpitations.  She normally sees Dr Theotis with [facility] pulmonology. Blood work shows acute on chronic anemia with hb from 11 to 9. RVP is negative.  CXR with possible pneumonia. PCCM consultation placed for additional evaluation and management.   11/21/23- patient resting in bed in no acute distress.  She reports worsening dyspnea but only after meals.  She denies abd pain. She does have worsening anemia which can be cause of postprandial dyspnea. She is now close to baseline on 4L/min [place].  She would like to go back to [employer]. I can narrow abx to augmentin  PO today. She is at risk of pill induced esophagitis due to immobility.  She has family hx of lung cancer , father died at young age with lung cancer.  We can work on any neoplasm with outpatient follow up.  She remains with poor prognosis and I recommend outpatient palliative evaluation due to >40 pack years of smoking with emphysema and chronic hypoxemia with immobility.  Risk of re-admission and aspiration.  I reviewed inhaler technique  using Breztre Aerosphere and patient is able to use her COPD medication properly.    PAST MEDICAL HISTORY   Past Medical History:  Diagnosis Date   Asthma    Cancer (HCC) 2010   Left ear cancer. BCC   COPD (chronic obstructive pulmonary disease) (HCC)    COVID-19 05/27/2019   Diabetes mellitus without complication (HCC)    Emphysema lung (HCC)    Hypertension    Stroke [facility])      SURGICAL HISTORY   Past Surgical History:  Procedure Laterality Date   ABDOMINAL HYSTERECTOMY     BREAST BIOPSY Left 1990   neg   CHOLECYSTECTOMY     COLONOSCOPY WITH PROPOFOL  N/A 12/01/2017   Procedure: COLONOSCOPY WITH PROPOFOL ;  Surgeon: Toledo, Ladell POUR, MD;  Location: [facility] ENDOSCOPY;  Service: Gastroenterology;  Laterality: N/A;   ESOPHAGOGASTRODUODENOSCOPY N/A 12/01/2017   Procedure: ESOPHAGOGASTRODUODENOSCOPY (EGD);  Surgeon: Toledo, Ladell POUR, MD;  Location: [facility] ENDOSCOPY;  Service: Gastroenterology;  Laterality: N/A;   NECK SURGERY       FAMILY HISTORY   Family History  Problem Relation Age of Onset   Ovarian cancer Mother    Lung cancer Father    Breast cancer Paternal Aunt      SOCIAL HISTORY   Social History   Tobacco Use  Smoking status: Former    Passive exposure: Past   Smokeless tobacco: Never  Vaping Use   Vaping status: Never Used  Substance Use Topics   Alcohol use: No   Drug use: No     MEDICATIONS    Home Medication:    Current Medication:  Current Facility-Administered Medications:    acetaminophen  (TYLENOL ) tablet 650 mg, 650 mg, Oral, Q6H PRN, Paudel, Keshab, MD, 650 mg at 11/17/23 0851   albuterol  (PROVENTIL ) (2.5 MG/3ML) 0.083% nebulizer solution 2.5 mg, 2.5 mg, Nebulization, Q2H PRN, Josette, Richard, MD, 2.5 mg at 11/20/23 9482   albuterol  (PROVENTIL ) (2.5 MG/3ML) 0.083% nebulizer solution 2.5 mg, 2.5 mg, Nebulization, TID, Wieting, Richard, MD, 2.5 mg at 11/21/23 0654   azithromycin  (ZITHROMAX ) 500 mg in sodium chloride  0.9 % 250 mL IVPB,  500 mg, Intravenous, Q24H, Paudel, Keshab, MD, Last Rate: 250 mL/hr at 11/21/23 0548, 500 mg at 11/21/23 0548   bisacodyl  (DULCOLAX) suppository 10 mg, 10 mg, Rectal, PRN, Paudel, Keshab, MD, 10 mg at 11/20/23 9385   budesonide -glycopyrrolate -formoterol  (BREZTRI ) 160-9-4.8 MCG/ACT inhaler 2 puff, 2 puff, Inhalation, BID, Wieting, Richard, MD, 2 puff at 11/20/23 2019   cefTRIAXone  (ROCEPHIN ) 2 g in sodium chloride  0.9 % 100 mL IVPB, 2 g, Intravenous, Q24H, Paudel, Keshab, MD, Last Rate: 200 mL/hr at 11/21/23 0548, 2 g at 11/21/23 0548   clonazePAM  (KLONOPIN ) tablet 1 mg, 1 mg, Oral, Q12H, Wieting, Richard, MD, 1 mg at 11/21/23 0008   enoxaparin  (LOVENOX ) injection 40 mg, 40 mg, Subcutaneous, Q24H, Paudel, Keshab, MD, 40 mg at 11/21/23 0008   fluticasone  (FLONASE ) 50 MCG/ACT nasal spray 2 spray, 2 spray, Each Nare, QODAY, Wieting, Richard, MD, 2 spray at 11/19/23 0900   guaiFENesin  (MUCINEX ) 12 hr tablet 600 mg, 600 mg, Oral, BID PRN, Paudel, Keshab, MD, 600 mg at 11/17/23 0851   hydrALAZINE  (APRESOLINE ) injection 10 mg, 10 mg, Intravenous, Q6H PRN, Mansy, Jan A, MD, 10 mg at 11/20/23 2018   HYDROcodone -acetaminophen  (NORCO/VICODIN) 5-325 MG per tablet 1 tablet, 1 tablet, Oral, Q4H PRN, Josette Ade, MD, 1 tablet at 11/20/23 2016   hydrOXYzine  (ATARAX ) tablet 10 mg, 10 mg, Oral, TID PRN, Ponnala, Shruthi, MD, 10 mg at 11/20/23 2017   insulin  aspart (novoLOG ) injection 0-6 Units, 0-6 Units, Subcutaneous, TID WC, Paudel, Keshab, MD, 3 Units at 11/20/23 1711   insulin  aspart (novoLOG ) injection 2 Units, 2 Units, Subcutaneous, TID WC, Ponnala, Shruthi, MD   insulin  glargine (LANTUS ) injection 6 Units, 6 Units, Subcutaneous, Daily, Wieting, Richard, MD, 6 Units at 11/20/23 0830   levothyroxine  (SYNTHROID ) tablet 112 mcg, 112 mcg, Oral, Q0600, Paudel, Keshab, MD, 112 mcg at 11/21/23 0604   loratadine  (CLARITIN ) tablet 10 mg, 10 mg, Oral, Daily, Wieting, Richard, MD, 10 mg at 11/20/23 9171   losartan   (COZAAR ) tablet 25 mg, 25 mg, Oral, Daily, Paudel, Keshab, MD, 25 mg at 11/20/23 9171   montelukast  (SINGULAIR ) tablet 10 mg, 10 mg, Oral, QHS, Wieting, Richard, MD, 10 mg at 11/21/23 0008   ondansetron  (ZOFRAN ) injection 4 mg, 4 mg, Intravenous, Q6H PRN, Paudel, Keshab, MD, 4 mg at 11/20/23 2025   pantoprazole  (PROTONIX ) EC tablet 40 mg, 40 mg, Oral, BID WC, Paudel, Keshab, MD, 40 mg at 11/20/23 1206   pneumococcal 20-valent conjugate vaccine (PREVNAR 20 ) injection 0.5 mL, 0.5 mL, Intramuscular, Tomorrow-1000, Paudel, Keshab, MD   polyethylene glycol (MIRALAX  / GLYCOLAX ) packet 17 g, 17 g, Oral, Daily, Ponnala, Shruthi, MD, 17 g at 11/20/23 0827   pramipexole  (MIRAPEX ) tablet 0.5 mg,  0.5 mg, Oral, Q12H, Wieting, Richard, MD, 0.5 mg at 11/20/23 2017   [START ON 11/22/2023] predniSONE  (DELTASONE ) tablet 45 mg, 45 mg, Oral, Q breakfast, Tesslyn Baumert, MD   senna-docusate (Senokot-S) tablet 1 tablet, 1 tablet, Oral, BID, Ponnala, Shruthi, MD, 1 tablet at 11/21/23 0008   sertraline  (ZOLOFT ) tablet 100 mg, 100 mg, Oral, Daily, Josette, Richard, MD, 100 mg at 11/20/23 9171   traZODone  (DESYREL ) tablet 150 mg, 150 mg, Oral, QHS, Paudel, Keshab, MD, 150 mg at 11/21/23 0007    ALLERGIES   Codeine, Morphine  and codeine, and Prednisone      REVIEW OF SYSTEMS    Review of Systems:  Gen:  Denies  fever, sweats, chills weigh loss  HEENT: Denies blurred vision, double vision, ear pain, eye pain, hearing loss, nose bleeds, sore throat Cardiac:  No dizziness, chest pain or heaviness, chest tightness,edema Resp:   reports dyspnea chronically  Gi: Denies swallowing difficulty, stomach pain, nausea or vomiting, diarrhea, constipation, bowel incontinence Gu:  Denies bladder incontinence, burning urine Ext:   Denies Joint pain, stiffness or swelling Skin: Denies  skin rash, easy bruising or bleeding or hives Endoc:  Denies polyuria, polydipsia , polyphagia or weight change Psych:   Denies depression,  insomnia or hallucinations   Other:  All other systems negative   VS: BP 122/62 (BP Location: Left Arm)   Pulse (!) 59   Temp 97.9 F (36.6 C) (Oral)   Resp 19   Ht 5' 4 (1.626 m)   Wt 59.1 kg   SpO2 97%   BMI 22.36 kg/m      PHYSICAL EXAM    GENERAL:NAD, no fevers, chills, no weakness no fatigue HEAD: Normocephalic, atraumatic.  EYES: Pupils equal, round, reactive to light. Extraocular muscles intact. No scleral icterus.  MOUTH: Moist mucosal membrane. Dentition intact. No abscess noted.  EAR, NOSE, THROAT: Clear without exudates. No external lesions.  NECK: Supple. No thyromegaly. No nodules. No JVD.  PULMONARY: decreased breath sounds with mild rhonchi worse at bases bilaterally.  CARDIOVASCULAR: S1 and S2. Regular rate and rhythm. No murmurs, rubs, or gallops. No edema. Pedal pulses 2+ bilaterally.  GASTROINTESTINAL: Soft, nontender, nondistended. No masses. Positive bowel sounds. No hepatosplenomegaly.  MUSCULOSKELETAL: No swelling, clubbing, or edema. Range of motion full in all extremities.  NEUROLOGIC: Cranial nerves II through XII are intact. No gross focal neurological deficits. Sensation intact. Reflexes intact.  SKIN: No ulceration, lesions, rashes, or cyanosis. Skin warm and dry. Turgor intact.  PSYCHIATRIC: Mood, affect within normal limits. The patient is awake, alert and oriented x 3. Insight, judgment intact.       IMAGING      EXAM: PORTABLE CHEST 1 VIEW   COMPARISON:  Chest radiographs 03/22/2022 and earlier.   FINDINGS: Portable AP upright view at 0449 hours. Large lung volumes. Centrilobular emphysema by CTA in 2023. Normal cardiac size and mediastinal contours. Visualized tracheal air column is within normal limits. No pneumothorax, pleural effusion or consolidation. However, compared to last year multifocal bilateral irregular peribronchial opacity appears increased. No pulmonary edema.   Prior cervical ACDF. Stable visualized osseous  structures. Negative visible bowel gas.   IMPRESSION: Emphysema (ICD10-J43.9), with widespread bilateral new irregular peribronchial opacity compatible with bilateral acute infectious exacerbation. No consolidation or pleural effusion at this time.     Electronically Signed   By: VEAR Hurst M.D.   On: 11/16/2023 05:23  ASSESSMENT/PLAN    Acute on chronic hypoxemic respiratory failure    - patient seems to have  moderate acute COPD exacerbation    - her viral workup is negative including COVID/FLU/RSV    - RVP 20 species is negative    - no signs of CHF with absence of LE edema, normal JVD    - CXR with nodular bilateral infiltrate    - patient at moderate risk of PE due to immobility from bedbound state - will need to rule out PE -CTPE ordered    - possible neoplasm related lesions - CT chest PE -reviewed with patient     - CRP mildy elevated      - resp cultures ordered    - blood cultures are negative x 1 day    Community acquired pneumonia    Multifocal infiltrate   - MRSA pCR  - modified abx to PO augmentin   - additional micro as above   Moderate COPD exacerbation    - Continue current abx    - breztri  bid    - nebulizer therapy    - PT/OT    - Incentive spirometry    - on prednisone       Bibasilar atelectasis    PT/OT    - incentive spirometry    - will consider VEST therapy vs Metaneb if not responsive   Physical deconditioning    - patient is immobile/bedbound   - PT /OT    Goals of care    - BODE index is >8 with overall poor prognosis   - recommendation for palliative care consultation due to current full code status and may be not in line with level of aggression that patient desires.       Thank you for allowing me to participate in the care of this patient.   Patient/Family are satisfied with care plan and all questions have been answered.    Provider disclosure: Patient with at least one acute or chronic illness or injury that poses a  threat to life or bodily function and is being managed actively during this encounter.  All of the below services have been performed independently by signing provider:  review of prior documentation from internal and or external health records.  Review of previous and current lab results.  Interview and comprehensive assessment during patient visit today. Review of current and previous chest radiographs/CT scans. Discussion of management and test interpretation with health care team and patient/family.   This document was prepared using Dragon voice recognition software and may include unintentional dictation errors.     Darlinda Bellows, M.D.  Division of Pulmonary & Critical Care Medicine

## 2023-11-22 DIAGNOSIS — J9622 Acute and chronic respiratory failure with hypercapnia: Secondary | ICD-10-CM | POA: Diagnosis not present

## 2023-11-22 DIAGNOSIS — J9621 Acute and chronic respiratory failure with hypoxia: Secondary | ICD-10-CM | POA: Diagnosis not present

## 2023-11-22 LAB — GLUCOSE, CAPILLARY
Glucose-Capillary: 105 mg/dL — ABNORMAL HIGH (ref 70–99)
Glucose-Capillary: 133 mg/dL — ABNORMAL HIGH (ref 70–99)
Glucose-Capillary: 322 mg/dL — ABNORMAL HIGH (ref 70–99)
Glucose-Capillary: 203 mg/dL — ABNORMAL HIGH (ref 70–99)

## 2023-11-22 LAB — BASIC METABOLIC PANEL WITH GFR
Anion gap: 7 (ref 5–15)
BUN: 13 mg/dL (ref 8–23)
CO2: 32 mmol/L (ref 22–32)
Calcium: 8.4 mg/dL — ABNORMAL LOW (ref 8.9–10.3)
Chloride: 101 mmol/L (ref 98–111)
Creatinine, Ser: 1.03 mg/dL — ABNORMAL HIGH (ref 0.44–1.00)
GFR, Estimated: 56 mL/min — ABNORMAL LOW (ref >60)
Glucose, Bld: 237 mg/dL — ABNORMAL HIGH (ref 70–99)
Potassium: 3.8 mmol/L (ref 3.5–5.1)
Sodium: 140 mmol/L (ref 135–145)

## 2023-11-22 LAB — HEMOGLOBIN: Hemoglobin: 9.1 g/dL — ABNORMAL LOW (ref 12.0–15.0)

## 2023-11-22 MED ORDER — MIRTAZAPINE 15 MG PO TABS
7.5000 mg | ORAL_TABLET | Freq: Every day | ORAL | Status: DC
  Administered 2023-11-22 – 2023-11-24 (×3): 7.5 mg via ORAL
  Filled 2023-11-22 (×4): qty 1

## 2023-11-22 MED ORDER — VITAMIN B-12 1000 MCG PO TABS
500.0000 ug | ORAL_TABLET | Freq: Every day | ORAL | Status: DC
  Administered 2023-11-22 – 2023-11-24 (×3): 500 ug via ORAL
  Filled 2023-11-22 (×3): qty 1

## 2023-11-22 NOTE — Plan of Care (Signed)

## 2023-11-22 NOTE — Progress Notes (Signed)
 Progress Note   Patient: Kristin [name] FMW:[id number] DOB: 29-Apr-1945 DOA: 11/16/2023     6 DOS: the patient was seen and examined on 11/22/2023   Brief hospital course: 78 y.o. female with medical history significant for asthma/COPD on home oxygen 4 L at baseline, DM, HTN, CVA HLD who was brought in by ambulance to [facility] ED for shortness of breath.  Patient is a long term resident of [facility]. Was on BiPAP on presentation, was transitioned to 4 L [place].  Admitted for acute on chronic hypoxic and hypercapnic respiratory failure, acute COPD exacerbation, multi focal pneumonia.  Hospital course as below  Assessment and Plan: Acute on chronic respiratory failure with hypoxia and hypercapnia (HCC) Patient came in with respiratory distress requiring BiPAP.  Continue BiPAP at night and 4 L of oxygen during the day.   Acute exacerbation of chronic obstructive pulmonary disease (COPD) Bibasilar atelectasis Continue azithromycin  Home inhalers, DuoNebs as needed Incentive spirometry S/p Solu-Medrol  changed to p.o. prednisone  45mg  Seen by pulmonary, appreciate recs   Multifocal CAP COVID/flu/RSV negative, respiratory viral panel negative CXR with nodular bilateral infiltrate Legionella negative Was on ceftriaxone , azithromycin , transition to p.o. Augmentin  09/06 CRP elevated, procalcitonin down trended strep pneumoniae antigen pending Fungal serology, respiratory culture Follow blood culture  Nodular opacity LUL CT PE negative for PE, scattered patchy opacities in the peripheral lung zones likely due to atypical infection or inflammatory etiology.   New 1.8 cm nodular opacity in superior left lower lobe, with small central lucencies which may represent air bronchograms, possible malignancy. Plan for PET scanning on outpatient basis  Mild AKI Cr inc 1.03 from 0.74 Encouraged po intake, no evidence of obstruction Hold Losartan  Monitor Cr  Chornic Anemia - Hb on presentation  11.1, s/p IV fluids now stable around 9.  Suspect hemoconcentrated on arrival - Iron studies consistent with ACD, FA normal - B12 low normal - add daily supplements, follow-up outpatient   Uncontrolled type 2 diabetes mellitus with hyperglycemia Steroid-induced hyperglycemia Lantus  6 units, SSI Add NovoLog  2 units TID   Depression On Zoloft  and clonazepam    Benign essential hypertension Continue Cozaar    Hypothyroidism, unspecified Continue levothyroxine   Constipation Senna, MiraLAX   Physical deconditioning PT/OT eval  Palliative care evaluation for goals of care -full code and full scope of treatment Palliative referral outpatient Risk of readmission    Subjective: Patient was examined at the bedside, Continues to have cough and states she has shortness of breath and headache.  Continue antibiotics and steroids per pulmonary  Physical Exam: Vitals:   11/22/23 0735 11/22/23 1146 11/22/23 1423 11/22/23 1509  BP: (!) 159/58 (!) 151/56  (!) 160/58  Pulse: 70 75  85  Resp:      Temp: 98 F (36.7 C) 98.4 F (36.9 C)  98.5 F (36.9 C)  TempSrc: Oral Oral  Oral  SpO2: 95% 94% 93% 95%  Weight:      Height:       General :NAD, no fevers, chills, no weakness no fatigue Neck: Supple. No thyromegaly. No nodules. No JVD.  Pulm: decreased breath sounds with mild rhonchi worse at bases bilaterally.  Cardio: S1 and S2. Regular rate and rhythm. No murmurs, rubs, or gallops. No edema. Pedal pulses 2+ bilaterally.  Abd: Soft, nontender, nondistended. No masses. Positive bowel sounds. No hepatosplenomegaly.  Neuro: Cranial nerves II through XII are intact. No gross focal neurological deficits. Sensation intact. Reflexes intact. Non ambulatory at baseline Skin: No ulceration, lesions, rashes, or cyanosis. Skin warm  and dry    Labs reviewed  Family Communication: Discussed with patient at the bedside and called Son Velinda and provided updates  Disposition: Status is:  Inpatient Remains inpatient appropriate because: Need to get respiratory status improved prior to disposition  Planned Discharge Destination: Long-term care    Time spent: 35 minutes  Author: Laree Lock, MD 11/22/2023 7:01 PM  For on call review [email or web address].

## 2023-11-23 ENCOUNTER — Encounter: Payer: Self-pay | Admitting: Family Medicine

## 2023-11-23 DIAGNOSIS — J9621 Acute and chronic respiratory failure with hypoxia: Secondary | ICD-10-CM | POA: Diagnosis not present

## 2023-11-23 DIAGNOSIS — J9622 Acute and chronic respiratory failure with hypercapnia: Secondary | ICD-10-CM | POA: Diagnosis not present

## 2023-11-23 LAB — BASIC METABOLIC PANEL WITH GFR
Anion gap: 9 (ref 5–15)
BUN: 14 mg/dL (ref 8–23)
CO2: 30 mmol/L (ref 22–32)
Calcium: 8.6 mg/dL — ABNORMAL LOW (ref 8.9–10.3)
Chloride: 102 mmol/L (ref 98–111)
Creatinine, Ser: 0.68 mg/dL (ref 0.44–1.00)
GFR, Estimated: >60 mL/min (ref >60)
Glucose, Bld: 166 mg/dL — ABNORMAL HIGH (ref 70–99)
Potassium: 3.7 mmol/L (ref 3.5–5.1)
Sodium: 141 mmol/L (ref 135–145)

## 2023-11-23 LAB — STREP PNEUMONIAE URINARY ANTIGEN: Strep Pneumo Urinary Antigen: NEGATIVE

## 2023-11-23 LAB — GLUCOSE, CAPILLARY
Glucose-Capillary: 121 mg/dL — ABNORMAL HIGH (ref 70–99)
Glucose-Capillary: 173 mg/dL — ABNORMAL HIGH (ref 70–99)
Glucose-Capillary: 249 mg/dL — ABNORMAL HIGH (ref 70–99)
Glucose-Capillary: 298 mg/dL — ABNORMAL HIGH (ref 70–99)
Glucose-Capillary: 323 mg/dL — ABNORMAL HIGH (ref 70–99)

## 2023-11-23 LAB — FUNGITELL BETA-D-GLUCAN: Fungitell Value:: <31.25 pg/mL

## 2023-11-23 LAB — HEMOGLOBIN: Hemoglobin: 9.6 g/dL — ABNORMAL LOW (ref 12.0–15.0)

## 2023-11-23 MED ORDER — ACETAMINOPHEN 325 MG PO TABS: 650.0000 mg | ORAL_TABLET | Freq: Four times a day (QID) | ORAL | Status: DC | PRN

## 2023-11-23 MED ORDER — INSULIN ASPART 100 UNIT/ML IJ SOLN
0.0000 [IU] | Freq: Three times a day (TID) | INTRAMUSCULAR | Status: DC
  Administered 2023-11-24: 9 [IU] via SUBCUTANEOUS
  Administered 2023-11-24: 2 [IU] via SUBCUTANEOUS
  Administered 2023-11-24: 1 [IU] via SUBCUTANEOUS
  Filled 2023-11-23 (×3): qty 1

## 2023-11-23 MED ORDER — PREDNISONE 10 MG PO TABS: 35.0000 mg | ORAL_TABLET | Freq: Every day | ORAL | Status: DC

## 2023-11-23 MED ORDER — PREDNISONE 20 MG PO TABS
20.0000 mg | ORAL_TABLET | Freq: Every day | ORAL | Status: DC
Start: 2023-11-28 — End: 2023-11-29

## 2023-11-23 MED ORDER — PREDNISONE 10 MG PO TABS: 30.0000 mg | ORAL_TABLET | Freq: Every day | ORAL | Status: DC

## 2023-11-23 MED ORDER — PREDNISONE 10 MG PO TABS: 10.0000 mg | ORAL_TABLET | Freq: Every day | ORAL | Status: DC

## 2023-11-23 MED ORDER — INSULIN ASPART 100 UNIT/ML IJ SOLN
0.0000 [IU] | Freq: Every day | INTRAMUSCULAR | Status: DC
  Administered 2023-11-23: 4 [IU] via SUBCUTANEOUS
  Filled 2023-11-23: qty 1

## 2023-11-23 MED ORDER — PREDNISONE 20 MG PO TABS
40.0000 mg | ORAL_TABLET | Freq: Every day | ORAL | Status: AC
  Administered 2023-11-24: 40 mg via ORAL
  Filled 2023-11-23: qty 2

## 2023-11-23 MED ORDER — PREDNISONE 10 MG PO TABS: 15.0000 mg | ORAL_TABLET | Freq: Every day | ORAL | Status: DC

## 2023-11-23 MED ORDER — PREDNISONE 50 MG PO TABS: 25.0000 mg | ORAL_TABLET | Freq: Every day | ORAL | Status: DC

## 2023-11-23 MED ORDER — LOSARTAN POTASSIUM 25 MG PO TABS
25.0000 mg | ORAL_TABLET | Freq: Every day | ORAL | Status: DC
  Administered 2023-11-23 – 2023-11-24 (×2): 25 mg via ORAL
  Filled 2023-11-23 (×2): qty 1

## 2023-11-23 NOTE — Progress Notes (Signed)
 Progress Note   Patient: Kristin [name] FMW:[id number] DOB: [birth year] DOA: 11/16/2023     7 DOS: the patient was seen and examined on 11/23/2023   Brief hospital course: 78 y.o. 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  Patient is a long term resident of [facility]. Was on BiPAP on presentation, was transitioned to 4 L [place].  Admitted for acute on chronic hypoxic and hypercapnic respiratory failure, acute COPD exacerbation, multi focal pneumonia.  Hospital course as below.  Patient medically ready, can go back to LTC tomorrow  Assessment and Plan: Acute on chronic respiratory failure with hypoxia and hypercapnia (HCC) Patient came in with respiratory distress requiring BiPAP.  Continue BiPAP at night and 4 L of oxygen during the day.   Acute exacerbation of chronic obstructive pulmonary disease (COPD) Bibasilar atelectasis Continue azithromycin  Home inhalers, DuoNebs as needed Incentive spirometry On Prednisone  taper Seen by pulmonary, appreciate recs   Multifocal CAP COVID/flu/RSV negative, respiratory viral panel negative CXR with nodular bilateral infiltrate Legionella negative Was on ceftriaxone , azithromycin , transition to p.o. Augmentin  09/06 CRP elevated, procalcitonin down trended strep pneumoniae antigen pending Fungal serology, respiratory culture Follow blood culture  Nodular opacity LUL CT PE negative for PE, scattered patchy opacities in the peripheral lung zones likely due to atypical infection or inflammatory etiology.   New 1.8 cm nodular opacity in superior left lower lobe, with small central lucencies which may represent air bronchograms, possible malignancy. Plan for PET scanning on outpatient basis  Mild AKI - resolved Monitor Cr Resume Losartan   Chornic Anemia - Hb on presentation 11.1, s/p IV fluids now stable  around 9.  Suspect hemoconcentrated on arrival - Iron studies consistent with ACD, FA normal - B12 low normal - add daily supplements, follow-up outpatient   Uncontrolled type 2 diabetes mellitus with hyperglycemia Steroid-induced hyperglycemia Lantus  6 units, SSI NovoLog  2 units TID   Depression On Zoloft  and clonazepam    Benign essential hypertension Continue Cozaar    Hypothyroidism, unspecified Continue levothyroxine   Constipation Senna, MiraLAX   Physical deconditioning PT/OT eval  Palliative care evaluation for goals of care -full code and full scope of treatment Palliative referral outpatient Risk of readmission    Subjective: Patient was examined at the bedside, Continues to have some SOB, at baseline.  Plan for discharge to LTC tomorrow  Physical Exam: Vitals:   11/23/23 0803 11/23/23 0828 11/23/23 1131 11/23/23 1549  BP:  (!) 140/52 (!) 143/57 (!) 150/56  Pulse:  65 69 73  Resp:  18 17 18   Temp:  98 F (36.7 C) 98.8 F (37.1 C) 97.9 F (36.6 C)  TempSrc:  Oral Oral Oral  SpO2: 97% 96% 96% 95%  Weight:      Height:       General :NAD, no fevers, chills, no weakness no fatigue Neck: Supple. No thyromegaly. No nodules. No JVD.  Pulm: decreased breath sounds with mild rhonchi worse at bases bilaterally.  Cardio: S1 and S2. Regular rate and rhythm. No murmurs, rubs, or gallops. No edema. Pedal pulses 2+ bilaterally.  Abd: Soft, nontender, nondistended. No masses. Positive bowel sounds. No hepatosplenomegaly.  Neuro: Cranial nerves II through XII are intact. No gross focal neurological deficits. Sensation intact. Reflexes intact. Non ambulatory at baseline Skin: No ulceration, lesions, rashes, or cyanosis. Skin warm and dry    Labs reviewed  Disposition: Status is:  Inpatient Remains inpatient appropriate because: Need to get respiratory status improved prior to disposition  Planned Discharge Destination: Long-term care    Time spent: 35  minutes  Author: Laree Lock, MD 11/23/2023 7:08 PM  For on call review [email or web address].

## 2023-11-23 NOTE — TOC Progression Note (Signed)
 Transition of Care [facility]) - Progression Note    Patient Details  Name: Kristin [name] MRN: [id number] Date of Birth: 01/04/1946  Transition [facility] Care [facility]) CM/SW Contact  Lauraine [name], LCSW Phone Number: 11/23/2023, 4:43 PM  Clinical Narrative:  SNF can accept patient back tomorrow.   Expected Discharge Plan: Skilled Nursing Facility Barriers to Discharge: Continued Medical Work up               Expected Discharge Plan and Services     Post Acute Care Choice: Resumption of Svcs/PTA Provider Living arrangements for the past 2 months: Skilled Nursing Facility                                       Social Drivers of Health (SDOH) Interventions SDOH Screenings   Food Insecurity: No Food Insecurity (11/16/2023)  Housing: Low Risk  (11/16/2023)  Transportation Needs: No Transportation Needs (11/16/2023)  Utilities: Not At Risk (11/16/2023)  Financial Resource Strain: Low Risk  (05/13/2023)   Received from [facility]  Physical Activity: Inactive (12/29/2019)   Received from [facility]  Social Connections: Socially Isolated (11/16/2023)  Stress: Stress Concern Present (12/29/2019)   Received from [facility]  Tobacco Use: Medium Risk (11/23/2023)    Readmission Risk Interventions    05/06/2021   10:38 AM  Readmission Risk Prevention Plan  Transportation Screening Complete  PCP or Specialist Appt within 5-7 Days Complete  Home Care Screening Complete  Medication Review (RN CM) Complete

## 2023-11-23 NOTE — Plan of Care (Signed)
  Problem: Clinical Measurements: Goal: Ability to maintain clinical measurements within normal limits will improve Outcome: Progressing Goal: Cardiovascular complication will be avoided Outcome: Progressing   Problem: Activity: Goal: Risk for activity intolerance will decrease Outcome: Progressing   Problem: Nutrition: Goal: Adequate nutrition will be maintained Outcome: Progressing   Problem: Elimination: Goal: Will not experience complications related to urinary retention Outcome: Progressing   Problem: Pain Managment: Goal: General experience of comfort will improve and/or be controlled Outcome: Progressing   Problem: Safety: Goal: Ability to remain free from injury will improve Outcome: Progressing   Problem: Skin Integrity: Goal: Risk for impaired skin integrity will decrease Outcome: Progressing   Problem: Fluid Volume: Goal: Ability to maintain a balanced intake and output will improve Outcome: Progressing   Problem: Health Behavior/Discharge Planning: Goal: Ability to manage health-related needs will improve Outcome: Progressing   Problem: Nutritional: Goal: Maintenance of adequate nutrition will improve Outcome: Progressing

## 2023-11-23 NOTE — Progress Notes (Signed)
 PULMONOLOGY         Date: 11/23/2023,   MRN# [id number] Kristin [name] Jun 18, 1945     AdmissionWeight: 59.1 kg                 CurrentWeight: 59.1 kg  Referring provider: Dr Ginnie   CHIEF COMPLAINT:   Acute on chronic hypoxemic respiratory failure   HISTORY OF PRESENT ILLNESS   This is 78 y.o. female from [facility] with PMH of  Asthma COPD overlap (ACOS) with chronic hypoxemia on home oxygen 4 L at baseline, DM, HTN, CVA HLD who was brought in by ambulance to [facility] ED for shortness of breath.  She was noted to have hypoxemia in mid 80s in the field on way to hospital.  She was using accessory muscles of respiration and appeared to be in distress.  She does not walk at baseline.  On arrival to ER she required BIPAP and improved.  She denies flu like illness, sick contacts, travel.  She denies any fever or chills.  She denies any nausea or vomiting.  She denies any chest pain or palpitations.  She normally sees Dr Theotis with [facility] pulmonology. Blood work shows acute on chronic anemia with hb from 11 to 9. RVP is negative.  CXR with possible pneumonia. PCCM consultation placed for additional evaluation and management.   11/23/23- patient seen at bedside, she reports feeling close tobaseline.   She uses 4L/min [place] at baseline.  She is asking if possible to go back home to [facility].  She does have PT/OT at her residence already set up.  She is cleared from pulmonary for dc home.   PAST MEDICAL HISTORY   Past Medical History:  Diagnosis Date   Asthma    Cancer (HCC) 2010   Left ear cancer. BCC   COPD (chronic obstructive pulmonary disease) (HCC)    COVID-19 05/27/2019   Diabetes mellitus without complication (HCC)    Emphysema lung (HCC)    Hypertension    Stroke [facility])      SURGICAL HISTORY   Past Surgical History:  Procedure Laterality Date   ABDOMINAL HYSTERECTOMY     BREAST BIOPSY Left 1990   neg   CHOLECYSTECTOMY     COLONOSCOPY WITH PROPOFOL   N/A 12/01/2017   Procedure: COLONOSCOPY WITH PROPOFOL ;  Surgeon: Toledo, Ladell POUR, MD;  Location: [facility] ENDOSCOPY;  Service: Gastroenterology;  Laterality: N/A;   ESOPHAGOGASTRODUODENOSCOPY N/A 12/01/2017   Procedure: ESOPHAGOGASTRODUODENOSCOPY (EGD);  Surgeon: Toledo, Ladell POUR, MD;  Location: [facility] ENDOSCOPY;  Service: Gastroenterology;  Laterality: N/A;   NECK SURGERY       FAMILY HISTORY   Family History  Problem Relation Age of Onset   Ovarian cancer Mother    Lung cancer Father    Breast cancer Paternal Aunt      SOCIAL HISTORY   Social History   Tobacco Use   Smoking status: Former    Passive exposure: Past   Smokeless tobacco: Never  Vaping Use   Vaping status: Never Used  Substance Use Topics   Alcohol use: No   Drug use: No     MEDICATIONS    Home Medication:    Current Medication:  Current Facility-Administered Medications:    acetaminophen  (TYLENOL ) tablet 650 mg, 650 mg, Oral, Q6H PRN, Paudel, Keshab, MD, 650 mg at 11/21/23 1258   albuterol  (PROVENTIL ) (2.5 MG/3ML) 0.083% nebulizer solution 2.5 mg, 2.5 mg, Nebulization, Q2H PRN, Josette Ade, MD, 2.5 mg at 11/20/23  9482   albuterol  (PROVENTIL ) (2.5 MG/3ML) 0.083% nebulizer solution 2.5 mg, 2.5 mg, Nebulization, TID, Wieting, Richard, MD, 2.5 mg at 11/23/23 0803   amoxicillin -clavulanate (AUGMENTIN ) 875-125 MG per tablet 1 tablet, 1 tablet, Oral, Q12H, Lyndzie Zentz, MD, 1 tablet at 11/22/23 2300   bisacodyl  (DULCOLAX) suppository 10 mg, 10 mg, Rectal, PRN, Paudel, Keshab, MD, 10 mg at 11/20/23 0614   budesonide -glycopyrrolate -formoterol  (BREZTRI ) 160-9-4.8 MCG/ACT inhaler 2 puff, 2 puff, Inhalation, BID, Josette, Richard, MD, 2 puff at 11/22/23 2301   clonazePAM  (KLONOPIN ) tablet 1 mg, 1 mg, Oral, Q12H, Wieting, Richard, MD, 1 mg at 11/22/23 2301   cyanocobalamin  (VITAMIN B12) tablet 500 mcg, 500 mcg, Oral, Daily, Ponnala, Shruthi, MD, 500 mcg at 11/22/23 2301   enoxaparin  (LOVENOX ) injection 40 mg,  40 mg, Subcutaneous, Q24H, Paudel, Keshab, MD, 40 mg at 11/22/23 2301   fluticasone  (FLONASE ) 50 MCG/ACT nasal spray 2 spray, 2 spray, Each Nare, QODAY, Wieting, Richard, MD, 2 spray at 11/21/23 1009   guaiFENesin  (MUCINEX ) 12 hr tablet 600 mg, 600 mg, Oral, BID PRN, Paudel, Keshab, MD, 600 mg at 11/17/23 0851   hydrALAZINE  (APRESOLINE ) injection 10 mg, 10 mg, Intravenous, Q6H PRN, Mansy, Jan A, MD, 10 mg at 11/20/23 2018   HYDROcodone -acetaminophen  (NORCO/VICODIN) 5-325 MG per tablet 1 tablet, 1 tablet, Oral, Q4H PRN, Josette Ade, MD, 1 tablet at 11/20/23 2016   hydrOXYzine  (ATARAX ) tablet 10 mg, 10 mg, Oral, TID PRN, Ponnala, Shruthi, MD, 10 mg at 11/20/23 2017   insulin  aspart (novoLOG ) injection 0-6 Units, 0-6 Units, Subcutaneous, TID WC, Paudel, Keshab, MD, 4 Units at 11/22/23 1751   insulin  aspart (novoLOG ) injection 2 Units, 2 Units, Subcutaneous, TID WC, Ponnala, Shruthi, MD, 2 Units at 11/22/23 1751   insulin  glargine (LANTUS ) injection 6 Units, 6 Units, Subcutaneous, Daily, Josette, Richard, MD, 6 Units at 11/22/23 0901   levothyroxine  (SYNTHROID ) tablet 112 mcg, 112 mcg, Oral, Q0600, Paudel, Keshab, MD, 112 mcg at 11/23/23 9347   loratadine  (CLARITIN ) tablet 10 mg, 10 mg, Oral, Daily, Wieting, Richard, MD, 10 mg at 11/22/23 9147   mirtazapine  (REMERON ) tablet 7.5 mg, 7.5 mg, Oral, Daily, Ponnala, Shruthi, MD, 7.5 mg at 11/22/23 2300   montelukast  (SINGULAIR ) tablet 10 mg, 10 mg, Oral, QHS, Wieting, Richard, MD, 10 mg at 11/22/23 2300   ondansetron  (ZOFRAN ) injection 4 mg, 4 mg, Intravenous, Q6H PRN, Paudel, Keshab, MD, 4 mg at 11/22/23 1415   pantoprazole  (PROTONIX ) EC tablet 40 mg, 40 mg, Oral, BID WC, Paudel, Keshab, MD, 40 mg at 11/22/23 1217   polyethylene glycol (MIRALAX  / GLYCOLAX ) packet 17 g, 17 g, Oral, Daily, Ponnala, Shruthi, MD, 17 g at 11/22/23 0854   pramipexole  (MIRAPEX ) tablet 0.5 mg, 0.5 mg, Oral, Q12H, Wieting, Richard, MD, 0.5 mg at 11/22/23 2302   predniSONE   (DELTASONE ) tablet 45 mg, 45 mg, Oral, Q breakfast, Aracelly Tencza, MD, 45 mg at 11/22/23 9147   senna-docusate (Senokot-S) tablet 1 tablet, 1 tablet, Oral, BID, Ponnala, Shruthi, MD, 1 tablet at 11/22/23 2300   sertraline  (ZOLOFT ) tablet 100 mg, 100 mg, Oral, Daily, Wieting, Richard, MD, 100 mg at 11/22/23 9147   traZODone  (DESYREL ) tablet 150 mg, 150 mg, Oral, QHS, Paudel, Keshab, MD, 150 mg at 11/22/23 2300    ALLERGIES   Codeine, Morphine  and codeine, and Prednisone      REVIEW OF SYSTEMS    Review of Systems:  Gen:  Denies  fever, sweats, chills weigh loss  HEENT: Denies blurred vision, double vision, ear pain, eye pain, hearing loss,  nose bleeds, sore throat Cardiac:  No dizziness, chest pain or heaviness, chest tightness,edema Resp:   reports dyspnea chronically  Gi: Denies swallowing difficulty, stomach pain, nausea or vomiting, diarrhea, constipation, bowel incontinence Gu:  Denies bladder incontinence, burning urine Ext:   Denies Joint pain, stiffness or swelling Skin: Denies  skin rash, easy bruising or bleeding or hives Endoc:  Denies polyuria, polydipsia , polyphagia or weight change Psych:   Denies depression, insomnia or hallucinations   Other:  All other systems negative   VS: BP (!) 140/52 (BP Location: Left Arm)   Pulse 65   Temp 98 F (36.7 C) (Oral)   Resp 18   Ht 5' 4 (1.626 m)   Wt 59.1 kg   SpO2 96%   BMI 22.36 kg/m      PHYSICAL EXAM    GENERAL:NAD, no fevers, chills, no weakness no fatigue HEAD: Normocephalic, atraumatic.  EYES: Pupils equal, round, reactive to light. Extraocular muscles intact. No scleral icterus.  MOUTH: Moist mucosal membrane. Dentition intact. No abscess noted.  EAR, NOSE, THROAT: Clear without exudates. No external lesions.  NECK: Supple. No thyromegaly. No nodules. No JVD.  PULMONARY: decreased breath sounds with mild rhonchi worse at bases bilaterally.  CARDIOVASCULAR: S1 and S2. Regular rate and rhythm. No  murmurs, rubs, or gallops. No edema. Pedal pulses 2+ bilaterally.  GASTROINTESTINAL: Soft, nontender, nondistended. No masses. Positive bowel sounds. No hepatosplenomegaly.  MUSCULOSKELETAL: No swelling, clubbing, or edema. Range of motion full in all extremities.  NEUROLOGIC: Cranial nerves II through XII are intact. No gross focal neurological deficits. Sensation intact. Reflexes intact.  SKIN: No ulceration, lesions, rashes, or cyanosis. Skin warm and dry. Turgor intact.  PSYCHIATRIC: Mood, affect within normal limits. The patient is awake, alert and oriented x 3. Insight, judgment intact.       IMAGING      EXAM: PORTABLE CHEST 1 VIEW   COMPARISON:  Chest radiographs 03/22/2022 and earlier.   FINDINGS: Portable AP upright view at 0449 hours. Large lung volumes. Centrilobular emphysema by CTA in 2023. Normal cardiac size and mediastinal contours. Visualized tracheal air column is within normal limits. No pneumothorax, pleural effusion or consolidation. However, compared to last year multifocal bilateral irregular peribronchial opacity appears increased. No pulmonary edema.   Prior cervical ACDF. Stable visualized osseous structures. Negative visible bowel gas.   IMPRESSION: Emphysema (ICD10-J43.9), with widespread bilateral new irregular peribronchial opacity compatible with bilateral acute infectious exacerbation. No consolidation or pleural effusion at this time.     Electronically Signed   By: VEAR Hurst M.D.   On: 11/16/2023 05:23  ASSESSMENT/PLAN    Acute on chronic hypoxemic respiratory failure    - patient seems to have moderate acute COPD exacerbation    - her viral workup is negative including COVID/FLU/RSV    - RVP 20 species is negative    - no signs of CHF with absence of LE edema, normal JVD    - CXR with nodular bilateral infiltrate    - patient at moderate risk of PE due to immobility from bedbound state - will need to rule out PE -CTPE ordered    -  possible neoplasm related lesions - CT chest PE -reviewed with patient     - CRP mildy elevated      - resp cultures ordered    - blood cultures are negative x 1 day    Community acquired pneumonia    Multifocal infiltrate   - MRSA pCR  - modified  abx to PO augmentin   - additional micro as above   Moderate COPD exacerbation    - Continue current abx    - breztri  bid    - nebulizer therapy    - PT/OT    - Incentive spirometry    - on prednisone       Bibasilar atelectasis    PT/OT    - incentive spirometry    - will consider VEST therapy vs Metaneb if not responsive   Physical deconditioning    - patient is immobile/bedbound   - PT /OT    Goals of care    - BODE index is >8 with overall poor prognosis   - recommendation for palliative care consultation due to current full code status and may be not in line with level of aggression that patient desires.       Thank you for allowing me to participate in the care of this patient.   Patient/Family are satisfied with care plan and all questions have been answered.    Provider disclosure: Patient with at least one acute or chronic illness or injury that poses a threat to life or bodily function and is being managed actively during this encounter.  All of the below services have been performed independently by signing provider:  review of prior documentation from internal and or external health records.  Review of previous and current lab results.  Interview and comprehensive assessment during patient visit today. Review of current and previous chest radiographs/CT scans. Discussion of management and test interpretation with health care team and patient/family.   This document was prepared using Dragon voice recognition software and may include unintentional dictation errors.     Bambie Pizzolato, M.D.  Division of Pulmonary & Critical Care Medicine

## 2023-11-23 NOTE — Inpatient Diabetes Management (Signed)
 Inpatient Diabetes Program Recommendations  AACE/ADA: New Consensus Statement on Inpatient Glycemic Control   Target Ranges:  Prepandial:   less than 140 mg/dL      Peak postprandial:   less than 180 mg/dL (1-2 hours)      Critically ill patients:  140 - 180 mg/dL    Latest Reference Range & Units 11/22/23 07:37 11/22/23 11:47 11/22/23 17:04 11/22/23 21:37 11/23/23 08:49  Glucose-Capillary 70 - 99 mg/dL 894 (H) 866 (H) 677 (H) 203 (H) 121 (H)   Review of Glycemic Control  Diabetes history: DM2 Outpatient Diabetes medications: None; Prednisone  10mg  daily  Current orders for Inpatient glycemic control: Lantus  6 units daily, Novolog  0-6 units TID with meals, Novolog  2 units TID with meals; Predisone 45 mg QAM    Inpatient Diabetes Program Recommendations:    Insulin : If steroids are continued as ordered, please consider increasing Novolog  correction to 0-9 units TID with meals and adding Novolog  0-5 units at bedtime.  Thanks, Earnie Gainer, RN, MSN, CDCES Diabetes Coordinator Inpatient Diabetes Program [phone] (Team Pager from 8am to 5pm)

## 2023-11-23 NOTE — Plan of Care (Signed)

## 2023-11-24 ENCOUNTER — Encounter: Payer: Self-pay | Admitting: Family Medicine

## 2023-11-24 DIAGNOSIS — J9622 Acute and chronic respiratory failure with hypercapnia: Secondary | ICD-10-CM | POA: Diagnosis not present

## 2023-11-24 DIAGNOSIS — J9621 Acute and chronic respiratory failure with hypoxia: Secondary | ICD-10-CM | POA: Diagnosis not present

## 2023-11-24 LAB — GLUCOSE, CAPILLARY
Glucose-Capillary: 134 mg/dL — ABNORMAL HIGH (ref 70–99)
Glucose-Capillary: 196 mg/dL — ABNORMAL HIGH (ref 70–99)
Glucose-Capillary: 386 mg/dL — ABNORMAL HIGH (ref 70–99)

## 2023-11-24 MED ORDER — HYDROCODONE-ACETAMINOPHEN 5-325 MG PO TABS: 1.0000 | ORAL_TABLET | ORAL | 0 refills | Status: AC | PRN

## 2023-11-24 MED ORDER — PREDNISONE 10 MG PO TABS: 10.0000 mg | ORAL_TABLET | Freq: Every day | ORAL | 0 refills | Status: AC

## 2023-11-24 MED ORDER — MORPHINE SULFATE (CONCENTRATE) 10 MG /0.5 ML PO SOLN: 5.0000 mg | Freq: Three times a day (TID) | ORAL | 0 refills | Status: AC | PRN

## 2023-11-24 MED ORDER — BUDESON-GLYCOPYRROL-FORMOTEROL 160-9-4.8 MCG/ACT IN AERO: 2.0000 | INHALATION_SPRAY | Freq: Two times a day (BID) | RESPIRATORY_TRACT | Status: AC

## 2023-11-24 MED ORDER — CLONAZEPAM 1 MG PO TABS: 1.0000 mg | ORAL_TABLET | Freq: Two times a day (BID) | ORAL | 0 refills | Status: AC

## 2023-11-24 MED ORDER — LORAZEPAM 0.5 MG PO TABS: 0.5000 mg | ORAL_TABLET | Freq: Three times a day (TID) | ORAL | 0 refills | Status: AC | PRN

## 2023-11-24 MED ORDER — PREDNISONE 10 MG PO TABS: ORAL_TABLET | ORAL | Status: AC

## 2023-11-24 NOTE — TOC Transition Note (Signed)
 Transition of Care [facility]) - Discharge Note   Patient Details  Name: Kristin [name] MRN: [id number] Date of Birth: 07-24-1945  [facility]) CM/SW Contact:  Lauraine [name], LCSW Phone Number: 11/24/2023, 4:21 PM   Clinical Narrative:  Patient has orders to discharge back to [facility] today. RN will call report to [phone] (Room 212A). [facility] has been arranged and she is 4th on the list. No further concerns. CSW signing off.   Final next level of care: Skilled Nursing Facility Barriers to Discharge: Barriers Resolved   Patient Goals and CMS Choice            Discharge Placement   Existing PASRR number confirmed : 11/24/23          Patient chooses bed at: [facility] Patient to be transferred to facility by: [employer] Name of family member notified: Velinda Donald Patient and family notified of of transfer: 11/24/23  Discharge Plan and Services Additional resources added to the After Visit Summary for       Post Acute Care Choice: Resumption of Svcs/PTA Provider                               Social Drivers of Health (SDOH) Interventions SDOH Screenings   Food Insecurity: No Food Insecurity (11/16/2023)  Housing: Low Risk  (11/16/2023)  Transportation Needs: No Transportation Needs (11/16/2023)  Utilities: Not At Risk (11/16/2023)  Financial Resource Strain: Low Risk  (05/13/2023)   Received from [facility]  Physical Activity: Inactive (12/29/2019)   Received from [facility]  Social Connections: Socially Isolated (11/16/2023)  Stress: Stress Concern Present (12/29/2019)   Received [facility]  Tobacco Use: Medium Risk (11/23/2023)     Readmission Risk Interventions    05/06/2021   10:38 AM  Readmission Risk Prevention Plan  Transportation Screening Complete  PCP or Specialist Appt within 5-7 Days Complete  Home Care  Screening Complete  Medication Review (RN CM) Complete

## 2023-11-24 NOTE — Progress Notes (Signed)
 PULMONOLOGY         Date: 11/24/2023,   MRN# [id number] Kristin [name] 23-Jul-1945     AdmissionWeight: 59.1 kg                 CurrentWeight: 59.1 kg  Referring provider: Dr Ginnie   CHIEF COMPLAINT:   Acute on chronic hypoxemic respiratory failure   HISTORY OF PRESENT ILLNESS   This is 78 y.o. 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  She was noted to have hypoxemia in mid 80s in the field on way to hospital.  She was using accessory muscles of respiration and appeared to be in distress.  She does not walk at baseline.  On arrival to ER she required BIPAP and improved.  She denies flu like illness, sick contacts, travel.  She denies any fever or chills.  She denies any nausea or vomiting.  She denies any chest pain or palpitations.  She normally sees Dr Theotis with [facility] pulmonology. Blood work shows acute on chronic anemia with hb from 11 to 9. RVP is negative.  CXR with possible pneumonia. PCCM consultation placed for additional evaluation and management.    11/24/23- patient is improved and is being optimized for dc home.  She will be seen on outpatient for lung nodule workup.  We will see her in 1 wk with [facility] pulmonary   PAST MEDICAL HISTORY   Past Medical History:  Diagnosis Date   Asthma    Cancer (HCC) 2010   Left ear cancer. BCC   COPD (chronic obstructive pulmonary disease) (HCC)    COVID-19 05/27/2019   Diabetes mellitus without complication (HCC)    Emphysema lung (HCC)    Hypertension    Stroke [facility])      SURGICAL HISTORY   Past Surgical History:  Procedure Laterality Date   ABDOMINAL HYSTERECTOMY     BREAST BIOPSY Left 1990   neg   CHOLECYSTECTOMY     COLONOSCOPY WITH PROPOFOL  N/A 12/01/2017   Procedure: COLONOSCOPY WITH PROPOFOL ;  Surgeon: Toledo, Ladell POUR, MD;  Location:  [facility] ENDOSCOPY;  Service: Gastroenterology;  Laterality: N/A;   ESOPHAGOGASTRODUODENOSCOPY N/A 12/01/2017   Procedure: ESOPHAGOGASTRODUODENOSCOPY (EGD);  Surgeon: Toledo, Ladell POUR, MD;  Location: [facility] ENDOSCOPY;  Service: Gastroenterology;  Laterality: N/A;   NECK SURGERY       FAMILY HISTORY   Family History  Problem Relation Age of Onset   Ovarian cancer Mother    Lung cancer Father    Breast cancer Paternal Aunt      SOCIAL HISTORY   Social History   Tobacco Use   Smoking status: Former    Passive exposure: Past   Smokeless tobacco: Never  Vaping Use   Vaping status: Never Used  Substance Use Topics   Alcohol use: No   Drug use: No     MEDICATIONS    Home Medication:    Current Medication:  Current Facility-Administered Medications:    acetaminophen  (TYLENOL ) tablet 650 mg, 650 mg, Oral, Q6H PRN, Patel, Kishan S, RPH   albuterol  (PROVENTIL ) (2.5 MG/3ML) 0.083% nebulizer solution 2.5 mg, 2.5 mg, Nebulization, Q2H PRN, Wieting, Richard, MD, 2.5 mg at 11/20/23 0517   albuterol  (PROVENTIL ) (2.5 MG/3ML) 0.083% nebulizer solution 2.5 mg, 2.5 mg, Nebulization, TID, Josette, Richard, MD, 2.5 mg at  11/24/23 0740   bisacodyl  (DULCOLAX) suppository 10 mg, 10 mg, Rectal, PRN, Paudel, Keshab, MD, 10 mg at 11/20/23 0614   budesonide -glycopyrrolate -formoterol  (BREZTRI ) 160-9-4.8 MCG/ACT inhaler 2 puff, 2 puff, Inhalation, BID, Josette, Richard, MD, 2 puff at 11/23/23 2112   clonazePAM  (KLONOPIN ) tablet 1 mg, 1 mg, Oral, Q12H, Wieting, Richard, MD, 1 mg at 11/23/23 2110   cyanocobalamin  (VITAMIN B12) tablet 500 mcg, 500 mcg, Oral, Daily, Ponnala, Shruthi, MD, 500 mcg at 11/23/23 9060   enoxaparin  (LOVENOX ) injection 40 mg, 40 mg, Subcutaneous, Q24H, Paudel, Keshab, MD, 40 mg at 11/23/23 2111   fluticasone  (FLONASE ) 50 MCG/ACT nasal spray 2 spray, 2 spray, Each Nare, QODAY, Wieting, Richard, MD, 2 spray at 11/23/23 0940   guaiFENesin  (MUCINEX ) 12 hr tablet 600 mg, 600 mg, Oral,  BID PRN, Paudel, Keshab, MD, 600 mg at 11/23/23 2110   hydrALAZINE  (APRESOLINE ) injection 10 mg, 10 mg, Intravenous, Q6H PRN, Mansy, Jan A, MD, 10 mg at 11/20/23 2018   HYDROcodone -acetaminophen  (NORCO/VICODIN) 5-325 MG per tablet 1 tablet, 1 tablet, Oral, Q4H PRN, Josette Ade, MD, 1 tablet at 11/20/23 2016   hydrOXYzine  (ATARAX ) tablet 10 mg, 10 mg, Oral, TID PRN, Ponnala, Shruthi, MD, 10 mg at 11/20/23 2017   insulin  aspart (novoLOG ) injection 0-5 Units, 0-5 Units, Subcutaneous, QHS, Ponnala, Shruthi, MD, 4 Units at 11/23/23 2217   insulin  aspart (novoLOG ) injection 0-9 Units, 0-9 Units, Subcutaneous, TID WC, Ponnala, Shruthi, MD   insulin  aspart (novoLOG ) injection 2 Units, 2 Units, Subcutaneous, TID WC, Ponnala, Shruthi, MD, 2 Units at 11/23/23 1626   insulin  glargine (LANTUS ) injection 6 Units, 6 Units, Subcutaneous, Daily, Josette Ade, MD, 6 Units at 11/23/23 9066   levothyroxine  (SYNTHROID ) tablet 112 mcg, 112 mcg, Oral, Q0600, Paudel, Keshab, MD, 112 mcg at 11/24/23 9356   loratadine  (CLARITIN ) tablet 10 mg, 10 mg, Oral, Daily, Wieting, Richard, MD, 10 mg at 11/23/23 9063   losartan  (COZAAR ) tablet 25 mg, 25 mg, Oral, Daily, Ponnala, Shruthi, MD, 25 mg at 11/23/23 1201   mirtazapine  (REMERON ) tablet 7.5 mg, 7.5 mg, Oral, Daily, Ponnala, Shruthi, MD, 7.5 mg at 11/23/23 0935   montelukast  (SINGULAIR ) tablet 10 mg, 10 mg, Oral, QHS, Wieting, Richard, MD, 10 mg at 11/23/23 2110   ondansetron  (ZOFRAN ) injection 4 mg, 4 mg, Intravenous, Q6H PRN, Paudel, Keshab, MD, 4 mg at 11/22/23 1415   pantoprazole  (PROTONIX ) EC tablet 40 mg, 40 mg, Oral, BID WC, Paudel, Keshab, MD, 40 mg at 11/23/23 1201   polyethylene glycol (MIRALAX  / GLYCOLAX ) packet 17 g, 17 g, Oral, Daily, Ponnala, Shruthi, MD, 17 g at 11/23/23 0931   pramipexole  (MIRAPEX ) tablet 0.5 mg, 0.5 mg, Oral, Q12H, Wieting, Richard, MD, 0.5 mg at 11/23/23 2109   predniSONE  (DELTASONE ) tablet 40 mg, 40 mg, Oral, Q breakfast **FOLLOWED  BY** [START ON 11/25/2023] predniSONE  (DELTASONE ) tablet 35 mg, 35 mg, Oral, Q breakfast **FOLLOWED BY** [START ON 11/26/2023] predniSONE  (DELTASONE ) tablet 30 mg, 30 mg, Oral, Q breakfast **FOLLOWED BY** [START ON 11/27/2023] predniSONE  (DELTASONE ) tablet 25 mg, 25 mg, Oral, Q breakfast **FOLLOWED BY** [START ON 11/28/2023] predniSONE  (DELTASONE ) tablet 20 mg, 20 mg, Oral, Q breakfast **FOLLOWED BY** [START ON 11/29/2023] predniSONE  (DELTASONE ) tablet 15 mg, 15 mg, Oral, Q breakfast **FOLLOWED BY** [START ON 11/30/2023] predniSONE  (DELTASONE ) tablet 10 mg, 10 mg, Oral, Q breakfast, Nyle Limb, MD   senna-docusate (Senokot-S) tablet 1 tablet, 1 tablet, Oral, BID, Ponnala, Shruthi, MD, 1 tablet at 11/23/23 2109   sertraline  (ZOLOFT ) tablet 100 mg, 100 mg, Oral, Daily,  Josette Ade, MD, 100 mg at 11/23/23 0940   traZODone  (DESYREL ) tablet 150 mg, 150 mg, Oral, QHS, Paudel, Keshab, MD, 150 mg at 11/23/23 2109    ALLERGIES   Codeine, Morphine  and codeine, and Prednisone      REVIEW OF SYSTEMS    Review of Systems:  Gen:  Denies  fever, sweats, chills weigh loss  HEENT: Denies blurred vision, double vision, ear pain, eye pain, hearing loss, nose bleeds, sore throat Cardiac:  No dizziness, chest pain or heaviness, chest tightness,edema Resp:   reports dyspnea chronically  Gi: Denies swallowing difficulty, stomach pain, nausea or vomiting, diarrhea, constipation, bowel incontinence Gu:  Denies bladder incontinence, burning urine Ext:   Denies Joint pain, stiffness or swelling Skin: Denies  skin rash, easy bruising or bleeding or hives Endoc:  Denies polyuria, polydipsia , polyphagia or weight change Psych:   Denies depression, insomnia or hallucinations   Other:  All other systems negative   VS: BP (!) 142/44 (BP Location: Left Arm)   Pulse 66   Temp 97.7 F (36.5 C)   Resp 18   Ht 5' 4 (1.626 m)   Wt 59.1 kg   SpO2 98%   BMI 22.36 kg/m      PHYSICAL EXAM    GENERAL:NAD,  no fevers, chills, no weakness no fatigue HEAD: Normocephalic, atraumatic.  EYES: Pupils equal, round, reactive to light. Extraocular muscles intact. No scleral icterus.  MOUTH: Moist mucosal membrane. Dentition intact. No abscess noted.  EAR, NOSE, THROAT: Clear without exudates. No external lesions.  NECK: Supple. No thyromegaly. No nodules. No JVD.  PULMONARY: decreased breath sounds with mild rhonchi worse at bases bilaterally.  CARDIOVASCULAR: S1 and S2. Regular rate and rhythm. No murmurs, rubs, or gallops. No edema. Pedal pulses 2+ bilaterally.  GASTROINTESTINAL: Soft, nontender, nondistended. No masses. Positive bowel sounds. No hepatosplenomegaly.  MUSCULOSKELETAL: No swelling, clubbing, or edema. Range of motion full in all extremities.  NEUROLOGIC: Cranial nerves II through XII are intact. No gross focal neurological deficits. Sensation intact. Reflexes intact.  SKIN: No ulceration, lesions, rashes, or cyanosis. Skin warm and dry. Turgor intact.  PSYCHIATRIC: Mood, affect within normal limits. The patient is awake, alert and oriented x 3. Insight, judgment intact.       IMAGING      EXAM: PORTABLE CHEST 1 VIEW   COMPARISON:  Chest radiographs 03/22/2022 and earlier.   FINDINGS: Portable AP upright view at 0449 hours. Large lung volumes. Centrilobular emphysema by CTA in 2023. Normal cardiac size and mediastinal contours. Visualized tracheal air column is within normal limits. No pneumothorax, pleural effusion or consolidation. However, compared to last year multifocal bilateral irregular peribronchial opacity appears increased. No pulmonary edema.   Prior cervical ACDF. Stable visualized osseous structures. Negative visible bowel gas.   IMPRESSION: Emphysema (ICD10-J43.9), with widespread bilateral new irregular peribronchial opacity compatible with bilateral acute infectious exacerbation. No consolidation or pleural effusion at this time.     Electronically  Signed   By: VEAR Hurst M.D.   On: 11/16/2023 05:23  ASSESSMENT/PLAN    Acute on chronic hypoxemic respiratory failure    - patient seems to have moderate acute COPD exacerbation    - her viral workup is negative including COVID/FLU/RSV    - RVP 20 species is negative    - no signs of CHF with absence of LE edema, normal JVD    - CXR with nodular bilateral infiltrate    - patient at moderate risk of PE due to immobility  from bedbound state - will need to rule out PE -CTPE ordered    - possible neoplasm related lesions - CT chest PE -reviewed with patient     - CRP mildy elevated      - resp cultures ordered    - blood cultures are negative x 1 day    Community acquired pneumonia    Multifocal infiltrate   - MRSA pCR  - modified abx to PO augmentin   - additional micro as above   Moderate COPD exacerbation    - Continue current abx    - breztri  bid    - nebulizer therapy    - PT/OT    - Incentive spirometry    - on prednisone       Bibasilar atelectasis    PT/OT    - incentive spirometry    - will consider VEST therapy vs Metaneb if not responsive   Physical deconditioning    - patient is immobile/bedbound   - PT /OT    Goals of care    - BODE index is >8 with overall poor prognosis   - recommendation for palliative care consultation due to current full code status and may be not in line with level of aggression that patient desires.       Thank you for allowing me to participate in the care of this patient.   Patient/Family are satisfied with care plan and all questions have been answered.    Provider disclosure: Patient with at least one acute or chronic illness or injury that poses a threat to life or bodily function and is being managed actively during this encounter.  All of the below services have been performed independently by signing provider:  review of prior documentation from internal and or external health records.  Review of previous and current  lab results.  Interview and comprehensive assessment during patient visit today. Review of current and previous chest radiographs/CT scans. Discussion of management and test interpretation with health care team and patient/family.   This document was prepared using Dragon voice recognition software and may include unintentional dictation errors.     Leslie Langille, M.D.  Division of Pulmonary & Critical Care Medicine

## 2023-11-24 NOTE — Plan of Care (Signed)
  Problem: Education: Goal: Knowledge of General Education information will improve Description: Including pain rating scale, medication(s)/side effects and non-pharmacologic comfort measures Outcome: Progressing   Problem: Clinical Measurements: Goal: Ability to maintain clinical measurements within normal limits will improve Outcome: Progressing   Problem: Coping: Goal: Level of anxiety will decrease Outcome: Progressing   Problem: Safety: Goal: Ability to remain free from injury will improve Outcome: Progressing   Problem: Coping: Goal: Ability to adjust to condition or change in health will improve Outcome: Progressing   Problem: Nutritional: Goal: Maintenance of adequate nutrition will improve Outcome: Progressing

## 2023-11-24 NOTE — Discharge Summary (Addendum)
 Physician Discharge Summary   Patient: Kristin [name] MRN: [id number] DOB: [birth year]  Admit date:     11/16/2023  Discharge date: 11/24/23  Discharge Physician: Laree Lock   PCP: Sadie Manna, MD   Recommendations at discharge:   Follow-up with PCP -repeat BMP, CBC in 1 to 2 weeks  With pulmonary outpatient, appointment will be made Follow-up with palliative care outpatient, appointment will be made  Discharge Diagnoses: Principal Problem:   Acute on chronic respiratory failure with hypoxia and hypercapnia (HCC) Active Problems:   Acute exacerbation of chronic obstructive pulmonary disease (COPD) (HCC)   Multifocal pneumonia   Uncontrolled type 2 diabetes mellitus with hyperglycemia, without long-term current use of insulin  (HCC)   Gastroesophageal reflux disease without esophagitis   Hypothyroidism, unspecified   Benign essential hypertension   Depression   Hospital Course: 78 y.o. female with medical history significant for asthma/COPD on home oxygen 4 L at baseline, DM, HTN, CVA HLD who was brought in by ambulance to [facility] ED for shortness of breath.  Patient is a long term resident of [facility]. Was on BiPAP on presentation, was transitioned to 4 L [place].  Admitted for acute on chronic hypoxic and hypercapnic respiratory failure, acute COPD exacerbation, multi focal pneumonia.  Hospital course as below.  Discharge back to LTC   Acute on chronic respiratory failure with hypoxia and hypercapnia Patient came in with respiratory distress requiring BiPAP.  Now on 4 L  which is at baseline   Acute exacerbation of chronic obstructive pulmonary disease Bibasilar atelectasis S/P azithromycin  Breztri , DuoNebs as needed On Prednisone  taper Seen by pulmonary, follow-up outpatient   Multifocal CAP COVID/flu/RSV negative, respiratory viral panel negative CXR with nodular bilateral infiltrate S/p completed antibiotic course   Nodular opacity LUL CT PE  negative for PE, scattered patchy opacities in the peripheral lung zones likely due to atypical infection or inflammatory etiology.   New 1.8 cm nodular opacity in superior left lower lobe, with small central lucencies which may represent air bronchograms, possible malignancy. Plan for PET scanning on outpatient basis, with pulmonary   Mild AKI - resolved Monitor Cr   Chornic Anemia - Hb on presentation 11.1, s/p IV fluids now stable around 9.  Suspect hemoconcentrated on arrival - Iron studies consistent with ACD, FA normal - B12 low normal, follow-up outpatient   Uncontrolled type 2 diabetes mellitus with hyperglycemia Steroid-induced hyperglycemia -Monitor blood glucose   Depression On Zoloft  and clonazepam    Benign essential hypertension Continue Cozaar    Hypothyroidism, unspecified Continue levothyroxine    Constipation Senna, MiraLAX    Physical deconditioning  Palliative care evaluation for goals of care -full code and full scope of treatment Palliative referral outpatient High Risk of readmission   Consultants: Pulmonary Procedures performed: None Disposition: Long term care facility Diet recommendation:  Discharge Diet Orders (From admission, onward)     Start     Ordered   11/24/23 0000  Diet - low sodium heart healthy        11/24/23 1538            DISCHARGE MEDICATION: Allergies as of 11/24/2023       Reactions   Codeine Nausea Only   Can take along with medication for nausea   Morphine  And Codeine Nausea Only   Can take along with medication for nausea   Prednisone  Other (See Comments)   Elevated BGL into the 300's        Medication List     STOP taking these  medications    amLODipine  5 MG tablet Commonly known as: NORVASC        TAKE these medications    acetaminophen  325 MG tablet Commonly known as: TYLENOL  Take 2 tablets (650 mg total) by mouth every 6 (six) hours as needed for mild pain (or Fever >/= 101). What changed:   when to take this Another medication with the same name was removed. Continue taking this medication, and follow the directions you see here.   atorvastatin  40 MG tablet Commonly known as: LIPITOR Take 40 mg by mouth daily.   bisacodyl  10 MG suppository Commonly known as: DULCOLAX Place 10 mg rectally as needed for moderate constipation.   budesonide -glycopyrrolate -formoterol  160-9-4.8 MCG/ACT Aero inhaler Commonly known as: BREZTRI  Inhale 2 puffs into the lungs 2 (two) times daily.   clonazePAM  1 MG tablet Commonly known as: KLONOPIN  Take 1 mg by mouth every 12 (twelve) hours.   cyanocobalamin  1000 MCG tablet Commonly known as: VITAMIN B12 Take 1,000 mcg by mouth daily.   diphenoxylate-atropine 2.5-0.025 MG tablet Commonly known as: LOMOTIL Take 1 tablet by mouth every 4 (four) hours as needed for diarrhea or loose stools.   fexofenadine 180 MG tablet Commonly known as: ALLEGRA Take 180 mg by mouth daily.   fluticasone  50 MCG/ACT nasal spray Commonly known as: FLONASE  Place 2 sprays into both nostrils every other day.   HYDROcodone -acetaminophen  5-325 MG tablet Commonly known as: NORCO/VICODIN Take 1 tablet by mouth every 4 (four) hours as needed.   ipratropium-albuterol  0.5-2.5 (3) MG/3ML Soln Commonly known as: DUONEB Take 3 mLs by nebulization every 6 (six) hours as needed.   levothyroxine  112 MCG tablet Commonly known as: SYNTHROID  Take 112 mcg by mouth daily before breakfast.   LORazepam  0.5 MG tablet Commonly known as: ATIVAN  Take 0.5 mg by mouth every 8 (eight) hours as needed for anxiety.   losartan  25 MG tablet Commonly known as: COZAAR  Take 25 mg by mouth daily.   meclizine  25 MG tablet Commonly known as: ANTIVERT  Take 25 mg by mouth every 8 (eight) hours as needed for dizziness.   mirtazapine  7.5 MG tablet Commonly known as: REMERON  Take 7.5 mg by mouth daily.   montelukast  10 MG tablet Commonly known as: SINGULAIR  Take 10 mg by mouth at  bedtime.   morphine  CONCENTRATE 10 mg / 0.5 ml concentrated solution Place 0.25 mLs under the tongue every 8 (eight) hours as needed.   Mucinex  600 MG 12 hr tablet Generic drug: guaiFENesin  Take 600 mg by mouth every 12 (twelve) hours as needed.   ondansetron  4 MG tablet Commonly known as: ZOFRAN  Take 4 mg by mouth every 8 (eight) hours as needed for nausea or vomiting.   OXYGEN Inhale 4 L/min into the lungs continuous.   pantoprazole  40 MG tablet Commonly known as: PROTONIX  Take 1 tablet by mouth 2 (two) times daily with breakfast and lunch.   pramipexole  0.5 MG tablet Commonly known as: MIRAPEX  Take 0.5 mg by mouth every 12 (twelve) hours.   predniSONE  10 MG tablet Commonly known as: DELTASONE  Take 3.5 tablets (35 mg total) by mouth daily with breakfast for 1 day, THEN 3 tablets (30 mg total) daily with breakfast for 1 day, THEN 2.5 tablets (25 mg total) daily with breakfast for 1 day, THEN 2 tablets (20 mg total) daily with breakfast for 1 day, THEN 1.5 tablets (15 mg total) daily with breakfast for 1 day, THEN 1 tablet (10 mg total) daily with breakfast for 1 day. Start taking on:  November 25, 2023 What changed: You were already taking a medication with the same name, and this prescription was added. Make sure you understand how and when to take each.   predniSONE  10 MG tablet Commonly known as: DELTASONE  Take 1 tablet (10 mg total) by mouth daily. Start taking on: December 01, 2023 What changed: These instructions start on December 01, 2023. If you are unsure what to do until then, ask your doctor or other care provider.   senna 8.6 MG Tabs tablet Commonly known as: SENOKOT Take 1 tablet by mouth at bedtime as needed for mild constipation.   sertraline  50 MG tablet Commonly known as: ZOLOFT  100 mg.   traZODone  150 MG tablet Commonly known as: DESYREL  Take 150 mg by mouth at bedtime.   Ventolin  HFA 108 (90 Base) MCG/ACT inhaler Generic drug: albuterol  Inhale 2  puffs into the lungs every 3 (three) hours as needed for wheezing or shortness of breath.   albuterol  (2.5 MG/3ML) 0.083% nebulizer solution Commonly known as: PROVENTIL  Take 2.5 mg by nebulization every 6 (six) hours.   vitamin D3 25 MCG tablet Commonly known as: CHOLECALCIFEROL Take 1,000 Units by mouth daily.        Contact information for after-discharge care     Destination     [facility] .   Service: Skilled Nursing Contact information: [address] [place]  [phone]                    Discharge Exam: Kristin [name]   11/16/23 0441  Weight: 59.1 kg   General :NAD, no fevers, chills, no weakness no fatigue Neck: Supple. No thyromegaly. No nodules. No JVD.  Pulm: decreased breath sounds with mild rhonchi Cardio: S1 and S2. Regular rate and rhythm. No murmurs, rubs, or gallops Abd: Soft, nontender, nondistended. No masses Neuro: Cranial nerves II through XII are intact. No gross focal neurological deficits. Sensation intact. Reflexes intact Skin: No ulceration, lesions, rashes, or cyanosis. Skin warm and dry  Condition at discharge: fair  The results of significant diagnostics from this hospitalization (including imaging, microbiology, ancillary and laboratory) are listed below for reference.   Imaging Studies: CT Angio Chest Pulmonary Embolism (PE) W or WO Contrast Result Date: 11/18/2023 CLINICAL DATA:  Acute respiratory distress. Hypoxia. High probability for pulmonary embolism. EXAM: CT ANGIOGRAPHY CHEST WITH CONTRAST TECHNIQUE: Multidetector CT imaging of the chest was performed using the standard protocol during bolus administration of intravenous contrast. Multiplanar CT image reconstructions and MIPs were obtained to evaluate the vascular anatomy. RADIATION DOSE REDUCTION: This exam was performed according to the departmental dose-optimization program which includes automated exposure control, adjustment of  the mA and/or kV according to patient size and/or use of iterative reconstruction technique. CONTRAST:  75mL OMNIPAQUE  IOHEXOL  350 MG/ML SOLN COMPARISON:  05/05/2021 FINDINGS: Cardiovascular: Satisfactory opacification of pulmonary arteries noted, and no pulmonary emboli identified. No evidence of thoracic aortic dissection or aneurysm. Aortic and coronary atherosclerotic calcification incidentally noted. Mediastinum/Nodes: No masses or pathologically enlarged lymph nodes identified. Lungs/Pleura: Mild-to-moderate centrilobular emphysema and bilateral lower lobe scarring again seen. A new lobulated nodular opacity is seen in the superior left lower lobe which measures 1.8 x 1.5 cm and shows small central lucencies which may represent air bronchograms (image 82/6). This may be due to infectious/inflammatory etiology although neoplasm cannot be excluded. Other scattered patchy opacities are also seen in the peripheral lung zones is which are new, likely due to atypical infection or inflammatory etiology.  No evidence of pleural effusion. Upper abdomen: No acute findings. Musculoskeletal: No suspicious bone lesions identified. Cervical spine fusion hardware noted. Review of the MIP images confirms the above findings. IMPRESSION: No evidence of pulmonary embolism. Emphysema. Scattered patchy opacities in the peripheral lung zones, likely due to atypical infection or inflammatory etiology. New 1.8 cm nodular opacity in superior left lower lobe, with small central lucencies which may represent air bronchograms. Differential diagnosis includes infectious and inflammatory etiologies, as well as the neoplasm. Consider one of the following in 3 months for both low-risk and high-risk individuals: (a) repeat chest CT, (b) follow-up PET-CT, or (c) tissue sampling. This recommendation follows the consensus statement: Guidelines for Management of Incidental Pulmonary Nodules Detected on CT Images: From the Fleischner Society 2017;  Radiology 2017; 284:228-243. Electronically Signed   By: Norleen DELENA Kil M.D.   On: 11/18/2023 10:42   DG Chest Port 1 View if patient is in a treatment room. Result Date: 11/16/2023 CLINICAL DATA:  78 year old female with possible sepsis. Shortness of breath. EXAM: PORTABLE CHEST 1 VIEW COMPARISON:  Chest radiographs 03/22/2022 and earlier. FINDINGS: Portable AP upright view at 0449 hours. Large lung volumes. Centrilobular emphysema by CTA in 2023. Normal cardiac size and mediastinal contours. Visualized tracheal air column is within normal limits. No pneumothorax, pleural effusion or consolidation. However, compared to last year multifocal bilateral irregular peribronchial opacity appears increased. No pulmonary edema. Prior cervical ACDF. Stable visualized osseous structures. Negative visible bowel gas. IMPRESSION: Emphysema (ICD10-J43.9), with widespread bilateral new irregular peribronchial opacity compatible with bilateral acute infectious exacerbation. No consolidation or pleural effusion at this time. Electronically Signed   By: VEAR Hurst M.D.   On: 11/16/2023 05:23    Microbiology: Results for orders placed or performed during the hospital encounter of 11/16/23  Culture, blood (Routine x 2)     Status: None   Collection Time: 11/16/23  4:46 AM   Specimen: BLOOD  Result Value Ref Range Status   Specimen Description BLOOD RIGHT ANTECUBITAL  Final   Special Requests   Final    BOTTLES DRAWN AEROBIC AND ANAEROBIC Blood Culture adequate volume   Culture   Final    NO GROWTH 5 DAYS Performed at [facility], [address] [place], [place] [address]    Report Status 11/21/2023 FINAL  Final  Resp panel by RT-PCR (RSV, Flu A&B, Covid) Anterior Nasal Swab     Status: None   Collection Time: 11/16/23  4:47 AM   Specimen: Anterior Nasal Swab  Result Value Ref Range Status   SARS Coronavirus 2 by RT PCR NEGATIVE NEGATIVE Final    Comment: (NOTE) SARS-CoV-2 target nucleic acids are NOT  DETECTED.  The SARS-CoV-2 RNA is generally detectable in upper respiratory specimens during the acute phase of infection. The lowest concentration of SARS-CoV-2 viral copies this assay can detect is 138 copies/mL. A negative result does not preclude SARS-Cov-2 infection and should not be used as the sole basis for treatment or other patient management decisions. A negative result may occur with  improper specimen collection/handling, submission of specimen other than nasopharyngeal swab, presence of viral mutation(s) within the areas targeted by this assay, and inadequate number of viral copies(<138 copies/mL). A negative result must be combined with clinical observations, patient history, and epidemiological information. The expected result is Negative.  Fact Sheet for Patients:  BloggerCourse.com  Fact Sheet for Healthcare Providers:  SeriousBroker.it  This test is no t yet approved or cleared by the [place]  FDA and  has been authorized for detection and/or diagnosis of SARS-CoV-2 by FDA under an Emergency Use Authorization (EUA). This EUA will remain  in effect (meaning this test can be used) for the duration of the COVID-19 declaration under Section 564(b)(1) of the Act, 21 U.S.C.section 360bbb-3(b)(1), unless the authorization is terminated  or revoked sooner.       Influenza A by PCR NEGATIVE NEGATIVE Final   Influenza B by PCR NEGATIVE NEGATIVE Final    Comment: (NOTE) The Xpert Xpress SARS-CoV-2/FLU/RSV plus assay is intended as an aid in the diagnosis of influenza from Nasopharyngeal swab specimens and should not be used as a sole basis for treatment. Nasal washings and aspirates are unacceptable for Xpert Xpress SARS-CoV-2/FLU/RSV testing.  Fact Sheet for Patients: BloggerCourse.com  Fact Sheet for Healthcare Providers: SeriousBroker.it  This test is not yet  approved or cleared by the [place]  FDA and has been authorized for detection and/or diagnosis of SARS-CoV-2 by FDA under an Emergency Use Authorization (EUA). This EUA will remain in effect (meaning this test can be used) for the duration of the COVID-19 declaration under Section 564(b)(1) of the Act, 21 U.S.C. section 360bbb-3(b)(1), unless the authorization is terminated or revoked.     Resp Syncytial Virus by PCR NEGATIVE NEGATIVE Final    Comment: (NOTE) Fact Sheet for Patients: BloggerCourse.com  Fact Sheet for Healthcare Providers: SeriousBroker.it  This test is not yet approved or cleared by the [place]  FDA and has been authorized for detection and/or diagnosis of SARS-CoV-2 by FDA under an Emergency Use Authorization (EUA). This EUA will remain in effect (meaning this test can be used) for the duration of the COVID-19 declaration under Section 564(b)(1) of the Act, 21 U.S.C. section 360bbb-3(b)(1), unless the authorization is terminated or revoked.  Performed at [facility], [address] [place], [place] [address]   Culture, blood (Routine x 2)     Status: None   Collection Time: 11/16/23  6:48 AM   Specimen: BLOOD  Result Value Ref Range Status   Specimen Description BLOOD BLOOD LEFT FOREARM  Final   Special Requests   Final    BOTTLES DRAWN AEROBIC AND ANAEROBIC Blood Culture results may not be optimal due to an inadequate volume of blood received in culture bottles   Culture   Final    NO GROWTH 5 DAYS Performed at [facility], [address] [place], [place] [address]    Report Status 11/21/2023 FINAL  Final  Respiratory (~20 pathogens) panel by PCR     Status: None   Collection Time: 11/17/23  2:43 PM   Specimen: Nasopharyngeal Swab; Respiratory  Result Value Ref Range Status   Adenovirus NOT DETECTED NOT DETECTED Final   Coronavirus 229E NOT DETECTED NOT DETECTED Final     Comment: (NOTE) The Coronavirus on the Respiratory Panel, DOES NOT test for the novel  Coronavirus (2019 nCoV)    Coronavirus HKU1 NOT DETECTED NOT DETECTED Final   Coronavirus NL63 NOT DETECTED NOT DETECTED Final   Coronavirus OC43 NOT DETECTED NOT DETECTED Final   Metapneumovirus NOT DETECTED NOT DETECTED Final   Rhinovirus / Enterovirus NOT DETECTED NOT DETECTED Final   Influenza A NOT DETECTED NOT DETECTED Final   Influenza B NOT DETECTED NOT DETECTED Final   Parainfluenza Virus 1 NOT DETECTED NOT DETECTED Final   Parainfluenza Virus 2 NOT DETECTED NOT DETECTED Final   Parainfluenza Virus 3 NOT DETECTED NOT DETECTED Final   Parainfluenza Virus 4 NOT DETECTED NOT DETECTED Final  Respiratory Syncytial Virus NOT DETECTED NOT DETECTED Final   Bordetella pertussis NOT DETECTED NOT DETECTED Final   Bordetella Parapertussis NOT DETECTED NOT DETECTED Final   Chlamydophila pneumoniae NOT DETECTED NOT DETECTED Final   Mycoplasma pneumoniae NOT DETECTED NOT DETECTED Final    Comment: Performed at [facility], [address] [place], [place] [address]    Labs: CBC: Recent Labs  Lab 11/18/23 [phone] 11/19/23 0548 11/20/23 0425 11/22/23 0350 11/23/23 0333  WBC 7.8 7.9 7.0  --   --   HGB 9.1* 9.9* 9.2* 9.1* 9.6*  HCT 28.9* 31.7* 28.9*  --   --   MCV 95.4 95.5 92.9  --   --   PLT 196 212 200  --   --    Basic Metabolic Panel: Recent Labs  Lab 11/18/23 0642 11/19/23 0548 11/20/23 0425 11/22/23 0350 11/23/23 0333  NA 141 144 142 140 141  K 4.5 3.7 3.8 3.8 3.7  CL 102 103 101 101 102  CO2 27 27 30  32 30  GLUCOSE 220* 99 131* 237* 166*  BUN 27* 22 15 13 14   CREATININE 0.87 0.85 0.74 1.03* 0.68  CALCIUM  8.6* 8.8* 8.9 8.4* 8.6*   Liver Function Tests: No results for input(s): AST, ALT, ALKPHOS, BILITOT, PROT, ALBUMIN in the last 168 hours. CBG: Recent Labs  Lab 11/23/23 1546 11/23/23 1621 11/23/23 2138 11/24/23 0809 11/24/23 1204  GLUCAP 249* 298* 323*  134* 196*    Discharge time spent: greater than 30 minutes.  Signed: Laree Lock, MD [facility] 11/24/2023

## 2024-01-20 ENCOUNTER — Other Ambulatory Visit: Payer: Self-pay | Admitting: Specialist

## 2024-01-20 DIAGNOSIS — R0789 Other chest pain: Secondary | ICD-10-CM

## 2024-01-20 DIAGNOSIS — R918 Other nonspecific abnormal finding of lung field: Secondary | ICD-10-CM

## 2024-01-20 DIAGNOSIS — R053 Chronic cough: Secondary | ICD-10-CM

## 2024-02-16 ENCOUNTER — Ambulatory Visit
Admission: RE | Admit: 2024-02-16 | Discharge: 2024-02-16 | Disposition: A | Source: Ambulatory Visit | Attending: Specialist | Admitting: Specialist

## 2024-02-16 DIAGNOSIS — R918 Other nonspecific abnormal finding of lung field: Secondary | ICD-10-CM | POA: Insufficient documentation

## 2024-02-16 DIAGNOSIS — R0789 Other chest pain: Secondary | ICD-10-CM | POA: Insufficient documentation

## 2024-02-16 DIAGNOSIS — R053 Chronic cough: Secondary | ICD-10-CM | POA: Insufficient documentation

## 2024-02-23 ENCOUNTER — Other Ambulatory Visit: Payer: Self-pay | Admitting: Specialist

## 2024-02-23 DIAGNOSIS — R918 Other nonspecific abnormal finding of lung field: Secondary | ICD-10-CM

## 2024-02-23 NOTE — Procedures (Signed)
 Procedure Simple Spirometry Pulmonary Function Test 02/23/2024   Referring Physician Theotis Lavelle BRAVO, MD  Reason for PFT Pulmonary mass Left sided chest wall pain Persistent cough   SPIROMETRY: FVC was 1.69 L, 65% of predicted FEV1 was .67 L, 34% of predicted FEV1/FVC ratio was 51% of predicted FEF 25-75% liters per second was 15% of predicted  Patient effort good w/ good repeatability. Patient appears weak but gave best effort.   Verey severe obstriction, very delayed expiratory flow volume loop  Poor lung function, not a surgical candidate for lung nodule resection    Interpreting Physician Dr. Theotis

## 2024-02-26 ENCOUNTER — Ambulatory Visit: Admission: RE | Admit: 2024-02-26 | Discharge: 2024-02-26 | Attending: Specialist | Admitting: Specialist

## 2024-02-26 DIAGNOSIS — R918 Other nonspecific abnormal finding of lung field: Secondary | ICD-10-CM

## 2024-02-26 LAB — GLUCOSE, CAPILLARY: Glucose-Capillary: 126 mg/dL — ABNORMAL HIGH (ref 70–99)

## 2024-02-26 MED ORDER — FLUDEOXYGLUCOSE F - 18 (FDG) INJECTION
7.0600 | Freq: Once | INTRAVENOUS | Status: AC | PRN
  Administered 2024-02-26: 7.06 via INTRAVENOUS

## 2024-02-29 ENCOUNTER — Ambulatory Visit: Admitting: Dermatology

## 2024-02-29 ENCOUNTER — Encounter: Payer: Self-pay | Admitting: Dermatology

## 2024-02-29 DIAGNOSIS — L570 Actinic keratosis: Secondary | ICD-10-CM

## 2024-02-29 DIAGNOSIS — L821 Other seborrheic keratosis: Secondary | ICD-10-CM | POA: Diagnosis not present

## 2024-02-29 DIAGNOSIS — D485 Neoplasm of uncertain behavior of skin: Secondary | ICD-10-CM

## 2024-02-29 NOTE — Patient Instructions (Addendum)
 Biopsy Wound Care Instructions  Leave the original bandage on for 24 hours if possible.  If the bandage becomes soaked or soiled before that time, it is OK to remove it and examine the wound.  A small amount of post-operative bleeding is normal.  If excessive bleeding occurs, remove the bandage, place gauze over the site and apply continuous pressure (no peeking) over the area for 30 minutes. If this does not work, please call our clinic as soon as possible or page your doctor if it is after hours.   Once a day, cleanse the wound with soap and water. It is fine to shower. If a thick crust develops you may use a Q-tip dipped into dilute hydrogen peroxide (mix 1:1 with water) to dissolve it.  Hydrogen peroxide can slow the healing process, so use it only as needed.    After washing, apply petroleum jelly (Vaseline) or an antibiotic ointment if your doctor prescribed one for you, followed by a bandage.    For best healing, the wound should be covered with a layer of ointment at all times. If you are not able to keep the area covered with a bandage to hold the ointment in place, this may mean re-applying the ointment several times a day.  Continue this wound care until the wound has healed and is no longer open.   Itching and mild discomfort is normal during the healing process. However, if you develop pain or severe itching, please call our office.   If you have any discomfort, you can take Tylenol  (acetaminophen ) or ibuprofen as directed on the bottle. (Please do not take these if you have an allergy to them or cannot take them for another reason).  Some redness, tenderness and white or yellow material in the wound is normal healing.  If the area becomes very sore and red, or develops a thick yellow-green material (pus), it may be infected; please notify us .    If you have stitches, return to clinic as directed to have the stitches removed. You will continue wound care for 2-3 days after the stitches  are removed.   Wound healing continues for up to one year following surgery. It is not unusual to experience pain in the scar from time to time during the interval.  If the pain becomes severe or the scar thickens, you should notify the office.    A slight amount of redness in a scar is expected for the first six months.  After six months, the redness will fade and the scar will soften and fade.  The color difference becomes less noticeable with time.  If there are any problems, return for a post-op surgery check at your earliest convenience.  To improve the appearance of the scar, you can use silicone scar gel, cream, or sheets (such as Mederma or Serica) every night for up to one year. These are available over the counter (without a prescription).  Please call our office at [phone] for any questions or concerns.      Due to recent changes in healthcare laws, you may see results of your pathology and/or laboratory studies on MyChart before the doctors have had a chance to review them. We understand that in some cases there may be results that are confusing or concerning to you. Please understand that not all results are received at the same time and often the doctors may need to interpret multiple results in order to provide you with the best plan of care or  course of treatment. Therefore, we ask that you please give us  2 business days to thoroughly review all your results before contacting the office for clarification. Should we see a critical lab result, you will be contacted sooner.   If You Need Anything After Your Visit  If you have any questions or concerns for your doctor, please call our main line at [phone] and press option 4 to reach your doctor's medical assistant. If no one answers, please leave a voicemail as directed and we will return your call as soon as possible. Messages left after 4 pm will be answered the following business day.   You may also send us  a message  via MyChart. We typically respond to MyChart messages within 1-2 business days.  For prescription refills, please ask your pharmacy to contact our office. Our fax number is [phone].  If you have an urgent issue when the clinic is closed that cannot wait until the next business day, you can page your doctor at the number below.    Please note that while we do our best to be available for urgent issues outside of office hours, we are not available 24/7.   If you have an urgent issue and are unable to reach us , you may choose to seek medical care at your doctor's office, retail clinic, urgent care center, or emergency room.  If you have a medical emergency, please immediately call 911 or go to the emergency department.  Pager Numbers  - Dr. Hester: [phone]  - Dr. Jackquline: [phone]  - Dr. Claudene: [phone]   - Dr. Raymund: [phone]  In the event of inclement weather, please call our main line at [phone] for an update on the status of any delays or closures.  Dermatology Medication Tips: Please keep the boxes that topical medications come in in order to help keep track of the instructions about where and how to use these. Pharmacies typically print the medication instructions only on the boxes and not directly on the medication tubes.   If your medication is too expensive, please contact our office at [phone] option 4 or send us  a message through MyChart.   We are unable to tell what your co-pay for medications will be in advance as this is different depending on your insurance coverage. However, we may be able to find a substitute medication at lower cost or fill out paperwork to get insurance to cover a needed medication.   If a prior authorization is required to get your medication covered by your insurance company, please allow us  1-2 business days to complete this process.  Drug prices often vary depending on where the prescription is filled and some  pharmacies may offer cheaper prices.  The website www.goodrx.com contains coupons for medications through different pharmacies. The prices here do not account for what the cost may be with help from insurance (it may be cheaper with your insurance), but the website can give you the price if you did not use any insurance.  - You can print the associated coupon and take it with your prescription to the pharmacy.  - You may also stop by our office during regular business hours and pick up a GoodRx coupon card.  - If you need your prescription sent electronically to a different pharmacy, notify our office through [facility] or by phone at [phone] option 4.     Si Usted Necesita Algo Despus de Su Visita  Tambin puede enviarnos un mensaje a travs de  MyChart. Por lo general respondemos a los mensajes de MyChart en el transcurso de 1 a 2 das hbiles.  Para renovar recetas, por favor pida a su farmacia que se ponga en contacto con nuestra oficina. Randi lakes de fax es [place] [phone].  Si tiene un asunto urgente cuando la clnica est cerrada y que no puede esperar hasta el siguiente da hbil, puede llamar/localizar a su doctor(a) al nmero que aparece a continuacin.   Por favor, tenga en cuenta que aunque hacemos todo lo posible para estar disponibles para asuntos urgentes fuera del horario de [place], no estamos disponibles las 24 horas del da, los 7 [address]  [address] de la [place].   Si tiene un problema urgente y no puede comunicarse con nosotros, puede optar por buscar atencin mdica  en el consultorio de su doctor(a), en una clnica privada, en un centro de atencin urgente o en una sala de emergencias.  Si tiene Engineer, drilling, por favor llame inmediatamente al 911 o vaya a la sala de emergencias.  Nmeros de bper  - Dr. Hester: [phone]  - Dra. Jackquline: [phone]  - Dr. Claudene: [phone]  - Dra. Kitts: [phone]  En caso de inclemencias del [place],  por favor llame a nuestra lnea principal al [phone] para una actualizacin sobre el estado de cualquier retraso o cierre.  Consejos para la medicacin en dermatologa: Por favor, guarde las cajas en las que vienen los medicamentos de uso tpico para ayudarle a seguir las instrucciones sobre dnde y cmo usarlos. Las farmacias generalmente imprimen las instrucciones del medicamento slo en las cajas y no directamente en los tubos del [place].   Si su medicamento es muy caro, por favor, pngase en contacto con landry rieger llamando al [phone] y presione la opcin 4 o envenos un mensaje a travs de Clinical cytogeneticist.   No podemos decirle cul ser su copago por los medicamentos por adelantado ya que esto es diferente dependiendo de la cobertura de su seguro. Sin embargo, es posible que podamos encontrar un medicamento sustituto a Audiological scientist un formulario para que el seguro cubra el medicamento que se considera necesario.   Si se requiere una autorizacin previa para que su compaa de seguros malta su medicamento, por favor permtanos de 1 a 2 das hbiles para completar este proceso.  Los precios de los medicamentos varan con frecuencia dependiendo del Environmental consultant de dnde se surte la receta y alguna farmacias pueden ofrecer precios ms baratos.  El sitio web www.goodrx.com tiene cupones para medicamentos de Health and safety inspector. Los precios aqu no tienen en cuenta lo que podra costar con la ayuda del seguro (puede ser ms barato con su seguro), pero el sitio web puede darle el precio si no utiliz Tourist information centre manager.  - Puede imprimir el cupn correspondiente y llevarlo con su receta a la farmacia.  - Tambin puede pasar por nuestra oficina durante el horario de atencin regular y Education officer, museum una tarjeta de cupones de GoodRx.  - Si necesita que su receta se enve electrnicamente a una farmacia diferente, informe a nuestra oficina a travs de MyChart de [place] o por telfono llamando al  [phone] y presione la opcin 4.

## 2024-02-29 NOTE — Progress Notes (Signed)
° °  New Patient Visit   Subjective  Kristin [name] is a 78 y.o. female who presents for the following: patient reports spot beside her left breast for a while, with pain. She has spots on her forehead and right temple that only itch.  Patient accompanied by caregiver.   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   The following portions of the chart were reviewed this encounter and updated as appropriate: medications, allergies, medical history  Review of Systems:  No other skin or systemic complaints except as noted in HPI or Assessment and Plan.  Objective  Well appearing patient in no apparent distress; mood and affect are within normal limits.   A focused examination was performed of the following areas: Face, ears, trunk  Relevant exam findings are noted in the Assessment and Plan.  R temple Erythematous thin papules/macules with gritty scale.  left flank 3.7 x 1.5 cm pink thin plaque with hyperkeratosis   Assessment & Plan    SEBORRHEIC KERATOSIS - Stuck-on, waxy, tan-brown papules and/or plaques forehead - Benign-appearing - Discussed benign etiology and prognosis. - Observe - Call for any changes   AK (ACTINIC KERATOSIS) R temple Actinic keratoses are precancerous spots that appear secondary to cumulative UV radiation exposure/sun exposure over time. They are chronic with expected duration over 1 year. A portion of actinic keratoses will progress to squamous cell carcinoma of the skin. It is not possible to reliably predict which spots will progress to skin cancer and so treatment is recommended to prevent development of skin cancer.  Recommend daily broad spectrum sunscreen SPF 30+ to sun-exposed areas, reapply every 2 hours as needed.  Recommend staying in the shade or wearing long sleeves, sun glasses (UVA+UVB protection) and wide brim hats (4-inch brim around the entire circumference of the hat). Call for new or changing lesions.  Deferred treatment.  Monitor for change. NEOPLASM OF UNCERTAIN BEHAVIOR OF SKIN left flank - Skin / nail biopsy Type of biopsy: tangential   Informed consent: discussed and consent obtained   Timeout: patient name, date of birth, surgical site, and procedure verified   Procedure prep:  Patient was prepped and draped in usual sterile fashion Prep type:  Isopropyl alcohol Anesthesia: the lesion was anesthetized in a standard fashion   Anesthetic:  1% lidocaine  w/ epinephrine 1-100,000 buffered w/ 8.4% NaHCO3 Instrument used: DermaBlade   Hemostasis achieved with: pressure and aluminum chloride   Outcome: patient tolerated procedure well   Post-procedure details: sterile dressing applied and wound care instructions given   Dressing type: bandage and petrolatum    Specimen 1 - Surgical pathology Differential Diagnosis: SK vs SCC  Check Margins: No SEBORRHEIC KERATOSES    Return if symptoms worsen or fail to improve.  LILLETTE Lonell Drones, RMA, am acting as scribe for Boneta Sharps, MD .   Documentation: I have reviewed the above documentation for accuracy and completeness, and I agree with the above.  Boneta Sharps, MD

## 2024-03-01 ENCOUNTER — Ambulatory Visit: Payer: Self-pay | Admitting: Dermatology

## 2024-03-01 LAB — SURGICAL PATHOLOGY

## 2024-03-02 NOTE — Telephone Encounter (Signed)
 Left message to return call regarding results and ask patient's preference to treat the rest, shave removal vs cryotherapy.

## 2024-03-02 NOTE — Telephone Encounter (Signed)
-----   Message from Boneta Sharps, MD sent at 03/01/2024  4:37 PM EST ----- left flank :       SEBORRHEIC KERATOSIS, IRRITATED   Plan: patient is in assisted living facility. call: [phone] and give patient name Biopsy shows benign thickening of top layer of skin. We can treat the rest with cryotherapy or shave removal per patient preference

## 2024-03-02 NOTE — Telephone Encounter (Signed)
 Kristin [name] at [facility] has been made aware of BX results. aw

## 2024-03-16 ENCOUNTER — Telehealth: Payer: Self-pay | Admitting: Radiation Oncology

## 2024-03-16 NOTE — Telephone Encounter (Signed)
 Pt called and left vm asking for return call to set up appt - called pt back and left vm - LH

## 2024-03-21 ENCOUNTER — Encounter: Payer: Self-pay | Admitting: Radiation Oncology

## 2024-03-21 ENCOUNTER — Ambulatory Visit
Admission: RE | Admit: 2024-03-21 | Discharge: 2024-03-21 | Disposition: A | Discharge status: Home / Self Care | Source: Ambulatory Visit | Attending: Radiation Oncology | Admitting: Radiation Oncology

## 2024-03-21 VITALS — BP 136/73 | HR 70 | Temp 98.4°F | Resp 16 | Wt 125.2 lb

## 2024-03-21 DIAGNOSIS — R918 Other nonspecific abnormal finding of lung field: Secondary | ICD-10-CM

## 2024-03-21 DIAGNOSIS — E119 Type 2 diabetes mellitus without complications: Secondary | ICD-10-CM | POA: Diagnosis not present

## 2024-03-21 DIAGNOSIS — J4489 Other specified chronic obstructive pulmonary disease: Secondary | ICD-10-CM | POA: Insufficient documentation

## 2024-03-21 DIAGNOSIS — Z79890 Hormone replacement therapy (postmenopausal): Secondary | ICD-10-CM | POA: Diagnosis not present

## 2024-03-21 DIAGNOSIS — Z9981 Dependence on supplemental oxygen: Secondary | ICD-10-CM | POA: Diagnosis not present

## 2024-03-21 DIAGNOSIS — Z8616 Personal history of COVID-19: Secondary | ICD-10-CM | POA: Insufficient documentation

## 2024-03-21 DIAGNOSIS — Z8673 Personal history of transient ischemic attack (TIA), and cerebral infarction without residual deficits: Secondary | ICD-10-CM | POA: Insufficient documentation

## 2024-03-21 DIAGNOSIS — E1151 Type 2 diabetes mellitus with diabetic peripheral angiopathy without gangrene: Secondary | ICD-10-CM | POA: Insufficient documentation

## 2024-03-21 DIAGNOSIS — I10 Essential (primary) hypertension: Secondary | ICD-10-CM | POA: Insufficient documentation

## 2024-03-21 DIAGNOSIS — Z8041 Family history of malignant neoplasm of ovary: Secondary | ICD-10-CM | POA: Insufficient documentation

## 2024-03-21 DIAGNOSIS — Z79899 Other long term (current) drug therapy: Secondary | ICD-10-CM | POA: Diagnosis not present

## 2024-03-21 DIAGNOSIS — Z87891 Personal history of nicotine dependence: Secondary | ICD-10-CM | POA: Insufficient documentation

## 2024-03-21 DIAGNOSIS — I739 Peripheral vascular disease, unspecified: Secondary | ICD-10-CM | POA: Insufficient documentation

## 2024-03-21 DIAGNOSIS — J439 Emphysema, unspecified: Secondary | ICD-10-CM | POA: Diagnosis not present

## 2024-03-21 DIAGNOSIS — Z803 Family history of malignant neoplasm of breast: Secondary | ICD-10-CM | POA: Insufficient documentation

## 2024-03-21 DIAGNOSIS — C3432 Malignant neoplasm of lower lobe, left bronchus or lung: Secondary | ICD-10-CM | POA: Insufficient documentation

## 2024-03-21 NOTE — Consult Note (Signed)
 " NEW PATIENT EVALUATION  Name: Kristin [name]  MRN: [id number]  Date:   03/21/2024     DOB: 06/01/1945   This 79 y.o. female patient presents to the clinic for initial evaluation of stage I A2 cT1b N0 M0) non-small cell lung cancer of the left lower lobe.  REFERRING PHYSICIAN: Sadie Manna, MD  CHIEF COMPLAINT:  Chief Complaint  Patient presents with   Lung Cancer    DIAGNOSIS: The encounter diagnosis was Other nonspecific abnormal finding of lung field.   PREVIOUS INVESTIGATIONS:  CT scans PET CT scans reviewed Labs reviewed Clinical notes reviewed  HPI: Patient is a 79 year old female with multiple medical comorbidities including severe COPD peripheral vascular disease type 2 diabetes major depression who is been followed by progressive left lower lobe lung lesion.  Lesion is approximately 2 cm in greatest dimension.  She did undergo a PET CT scan showing hypermetabolic 2.0 x 1.9 cm left lower lobe pulmonary nodule with intense metabolic activity concerning for bronchogenic carcinoma.  No metastatic adenopathy or distant metastatic disease was noted.  Patient is on chronic nasal oxygen and wheelchair-bound frail.  She also claims she has been having some hallucinations about her dead husband.  She specifically denies hemoptysis significant cough or chest pain.  PLANNED TREATMENT REGIMEN: SBRT  PAST MEDICAL HISTORY:  has a past medical history of Asthma, Cancer (HCC) (2010), COPD (chronic obstructive pulmonary disease) (HCC), COVID-19 (05/27/2019), Diabetes mellitus without complication (HCC), Emphysema lung (HCC), Hypertension, and Stroke (HCC).    PAST SURGICAL HISTORY:  Past Surgical History:  Procedure Laterality Date   ABDOMINAL HYSTERECTOMY     BREAST BIOPSY Left 1990   neg   CHOLECYSTECTOMY     COLONOSCOPY WITH PROPOFOL  N/A 12/01/2017   Procedure: COLONOSCOPY WITH PROPOFOL ;  Surgeon: Toledo, Ladell POUR, MD;  Location: [facility] ENDOSCOPY;  Service: Gastroenterology;   Laterality: N/A;   ESOPHAGOGASTRODUODENOSCOPY N/A 12/01/2017   Procedure: ESOPHAGOGASTRODUODENOSCOPY (EGD);  Surgeon: Toledo, Ladell POUR, MD;  Location: [facility] ENDOSCOPY;  Service: Gastroenterology;  Laterality: N/A;   NECK SURGERY      FAMILY HISTORY: family history includes Breast cancer in her paternal aunt; Lung cancer in her father; Ovarian cancer in her mother.  SOCIAL HISTORY:  reports that she has quit smoking. She has been exposed to tobacco smoke. She has never used smokeless tobacco. She reports that she does not drink alcohol and does not use drugs.  ALLERGIES: Codeine, Morphine  and codeine, and Prednisone   MEDICATIONS:  Current Outpatient Medications  Medication Sig Dispense Refill   acetaminophen  (TYLENOL ) 325 MG tablet Take 2 tablets (650 mg total) by mouth every 6 (six) hours as needed for mild pain (or Fever >/= 101). (Patient taking differently: Take 650 mg by mouth in the morning and at bedtime.)     albuterol  (PROVENTIL ) (2.5 MG/3ML) 0.083% nebulizer solution Take 2.5 mg by nebulization every 6 (six) hours.     atorvastatin  (LIPITOR) 40 MG tablet Take 40 mg by mouth daily.      bisacodyl  (DULCOLAX) 10 MG suppository Place 10 mg rectally as needed for moderate constipation.     budesonide -glycopyrrolate -formoterol  (BREZTRI ) 160-9-4.8 MCG/ACT AERO inhaler Inhale 2 puffs into the lungs 2 (two) times daily.     clonazePAM  (KLONOPIN ) 1 MG tablet Take 1 tablet (1 mg total) by mouth every 12 (twelve) hours. 10 tablet 0   diphenoxylate-atropine (LOMOTIL) 2.5-0.025 MG tablet Take 1 tablet by mouth every 4 (four) hours as needed for diarrhea or loose stools.     fexofenadine (  ALLEGRA) 180 MG tablet Take 180 mg by mouth daily.     fluticasone  (FLONASE ) 50 MCG/ACT nasal spray Place 2 sprays into both nostrils every other day.     guaiFENesin  (MUCINEX ) 600 MG 12 hr tablet Take 600 mg by mouth every 12 (twelve) hours as needed.     HYDROcodone -acetaminophen  (NORCO/VICODIN) 5-325 MG tablet  Take 1 tablet by mouth every 4 (four) hours as needed. 6 tablet 0   ipratropium-albuterol  (DUONEB) 0.5-2.5 (3) MG/3ML SOLN Take 3 mLs by nebulization every 6 (six) hours as needed.     levothyroxine  (SYNTHROID , LEVOTHROID) 112 MCG tablet Take 112 mcg by mouth daily before breakfast.      LORazepam  (ATIVAN ) 0.5 MG tablet Take 1 tablet (0.5 mg total) by mouth every 8 (eight) hours as needed for anxiety. 5 tablet 0   losartan  (COZAAR ) 25 MG tablet Take 25 mg by mouth daily.     meclizine  (ANTIVERT ) 25 MG tablet Take 25 mg by mouth every 8 (eight) hours as needed for dizziness.     mirtazapine  (REMERON ) 7.5 MG tablet Take 7.5 mg by mouth daily.     montelukast  (SINGULAIR ) 10 MG tablet Take 10 mg by mouth at bedtime.   11   Morphine  Sulfate (MORPHINE  CONCENTRATE) 10 mg / 0.5 ml concentrated solution Place 0.25 mLs (5 mg total) under the tongue every 8 (eight) hours as needed. 2 mL 0   ondansetron  (ZOFRAN ) 4 MG tablet Take 4 mg by mouth every 8 (eight) hours as needed for nausea or vomiting.     OXYGEN Inhale 4 L/min into the lungs continuous.      pantoprazole  (PROTONIX ) 40 MG tablet Take 1 tablet by mouth 2 (two) times daily with breakfast and lunch.     pramipexole  (MIRAPEX ) 0.5 MG tablet Take 0.5 mg by mouth every 12 (twelve) hours.     predniSONE  (DELTASONE ) 10 MG tablet Take 1 tablet (10 mg total) by mouth daily. 90 tablet 0   senna (SENOKOT) 8.6 MG TABS tablet Take 1 tablet by mouth at bedtime as needed for mild constipation.     sertraline  (ZOLOFT ) 50 MG tablet 100 mg.     traZODone  (DESYREL ) 150 MG tablet Take 150 mg by mouth at bedtime.  1   VENTOLIN  HFA 108 (90 Base) MCG/ACT inhaler Inhale 2 puffs into the lungs every 3 (three) hours as needed for wheezing or shortness of breath.      vitamin B-12 (CYANOCOBALAMIN ) 1000 MCG tablet Take 1,000 mcg by mouth daily.     Vitamin D3 (VITAMIN D ) 25 MCG tablet Take 1,000 Units by mouth daily.     No current facility-administered medications for this  encounter.    ECOG PERFORMANCE STATUS:  0 - Asymptomatic  REVIEW OF SYSTEMS: Patient denies any weight loss, fatigue, weakness, fever, chills or night sweats. Patient denies any loss of vision, blurred vision. Patient denies any ringing  of the ears or hearing loss. No irregular heartbeat. Patient denies heart murmur or history of fainting. Patient denies any chest pain or pain radiating to her upper extremities. Patient denies any shortness of breath, difficulty breathing at night, cough or hemoptysis. Patient denies any swelling in the lower legs. Patient denies any nausea vomiting, vomiting of blood, or coffee ground material in the vomitus. Patient denies any stomach pain. Patient states has had normal bowel movements no significant constipation or diarrhea. Patient denies any dysuria, hematuria or significant nocturia. Patient denies any problems walking, swelling in the joints or loss of  balance. Patient denies any skin changes, loss of hair or loss of weight. Patient denies any excessive worrying or anxiety or significant depression. Patient denies any problems with insomnia. Patient denies excessive thirst, polyuria, polydipsia. Patient denies any swollen glands, patient denies easy bruising or easy bleeding. Patient denies any recent infections, allergies or URI. Patient s visual fields have not changed significantly in recent time.   PHYSICAL EXAM: BP 136/73   Pulse 70   Temp 98.4 F (36.9 C) (Tympanic)   Resp 16   Wt 125 lb 3.2 oz (56.8 kg) Comment: nursing home  BMI 21.49 kg/m  Frail-appearing female in NAD.  She is on chronic nasal oxygen.  Well-developed well-nourished patient in NAD. HEENT reveals PERLA, EOMI, discs not visualized.  Oral cavity is clear. No oral mucosal lesions are identified. Neck is clear without evidence of cervical or supraclavicular adenopathy. Lungs are clear to A&P. Cardiac examination is essentially unremarkable with regular rate and rhythm without murmur rub  or thrill. Abdomen is benign with no organomegaly or masses noted. Motor sensory and DTR levels are equal and symmetric in the upper and lower extremities. Cranial nerves II through XII are grossly intact. Proprioception is intact. No peripheral adenopathy or edema is identified. No motor or sensory levels are noted. Crude visual fields are within normal range.  LABORATORY DATA: Labs reviewed    RADIOLOGY RESULTS: CT scans and PET CT scans reviewed compatible with above-stated findings   IMPRESSION: Stage I A2 non-small cell lung cancer left lower lobe in 79 year old female with FEV1 of approximately 30% of predicted high risk for biopsy in 79 year old female  PLAN: At this time based on the CT findings and PET/CT findings I believe she would be a good candidate for SBRT.  Would plan on delivering 55 Gray in 5 fractions to her left lower lobe lesion.  Risks and benefits of treatment occluding extremely small side effect profile from SBRT were reviewed with the patient and her daughter-in-law.  I have scheduled patient for CT simulation for later this week.  Patient comprehends her recommendations well.  I would like to take this opportunity to thank you for allowing me to participate in the care of your patient.SABRA Marcey Penton, MD         "

## 2024-03-23 DIAGNOSIS — N3289 Other specified disorders of bladder: Secondary | ICD-10-CM | POA: Insufficient documentation

## 2024-03-23 NOTE — Assessment & Plan Note (Addendum)
 3.5 cm Left bladder wall mass (dx Dec 2025, via body PET)   - significant medical comorbidity, COPD on home O2, +frailty   Reviewed her clinical history and recent pet imaging which incidentally noted a mass within the left posterior bladder.  This likely represents a form of bladder cancer, suspect nonmuscle invasive given lack of bladder wall thickening, abnormal contours or left trigonal/ureteral involvement-although this remains uncertain without diagnostic biopsy.  Considering her health and complex medical history, management is a bit more nuanced.  I offered a couple of options:   1) expectant management only, pursue further workup or treatment only in palliative scenario (if recurrent symptomatic gross hematuria or refractory local bladder symptoms).  Competing morbidity likely outweighs risks of local bladder cancer 2) office cystoscopy -assess general character of mass, papillary vs necrotic/MIBC appearing, bleeding risk (low risk and may help guide long-term strategy) 3) TURBT in OR -definitive diagnosis, may be able to resect entirely, although very real perioperative risk with anesthesia, bleeding (long-term outcomes uncertain with competing comorbidity)   - Ultimately patient was more aligned with watchful waiting type approach which I fully agree.  Acknowledged likely bladder cancer, although will intervene only if medically necessary or significantly symptomatic - Additionally, I let her know that I was more than happy to have this conversation again with family members if she would like to return

## 2024-03-23 NOTE — Progress Notes (Signed)
 "  03/25/2024 10:37 AM   Kristin [name] 12-25-1945 [id number]  Reason for visit: Follow up bladder mass   HPI: 79 y.o. female, initial follow up with me today, previously seen by Dr. 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  - Unable to provide urine sample today  10-20 pack year tobacco hx, quit in 2015 Currently wheelchair bound Hx of severe COPD on home O2, PAD, DM2, diabetic neuropathy, HTN      Physical Exam: BP 132/67   Pulse 81   Ht 5' 4 (1.626 m)   Wt 125 lb (56.7 kg)   BMI 21.46 kg/m    General: no acute distress, alert/oriented, conversational  HEENT: equal nondilated pupils CV: regular rate Lung: unlabored breathing, regular rate and rhythm  Abd: nondistended, nontender with palpation, no palpable masses  MSK: moving all extremities without issue, normal observed motor function   Laboratory Data: Creatinine, Ser 0.68 1.03 High  0.74 0.85 0.87    Pertinent Imaging: I have personally viewed and interpreted the body PET (02/26/24) -atrophic bilateral kidneys, no hydroureteronephrosis.  3.5 cm left lateral bladder mass with indeterminate PET avidity.  Bladder contours appear thin and symmetric, no obvious left-sided thickening or involvement of left trigone or left ureter no obvious regional lymphadenopathy or PET avid nodes  IMPRESSION: 1. Persistent 2.0 x 1.9 cm left lower lobe pulmonary nodule with intense metabolic activity, stable in  size compared to prior CT exams. 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 2. No metastatic adenopathy or distant metastatic disease. 3. 3.4 x 4.2 cm soft tissue mass in the posterior left bladder wall, metabolic activity indeterminate due to urinary FDG, recommend dedicated urologic evaluation and correlation with cystoscopy. .    Assessment & Plan:    Bladder mass Assessment & Plan: 3.5 cm Left bladder wall mass (dx Dec 2025, via body PET)   - significant medical comorbidity, COPD on home O2, +frailty   Reviewed her clinical history and recent pet imaging which incidentally noted a mass within the left posterior bladder.  This likely represents a form of bladder cancer, suspect nonmuscle invasive given lack of bladder wall thickening, abnormal contours or left trigonal/ureteral involvement-although this remains uncertain without diagnostic biopsy.  Considering her health and complex medical history, management is a bit more nuanced.  I offered a couple of options:   1) expectant management only, pursue further workup or treatment only in palliative scenario (if recurrent symptomatic gross hematuria or refractory local bladder symptoms).  Competing morbidity likely outweighs risks of local bladder cancer 2) office cystoscopy -assess general character of mass, papillary vs necrotic/MIBC appearing, bleeding risk (low risk and may help guide long-term strategy) 3) TURBT in OR -definitive diagnosis, may be able to resect entirely, although very real perioperative risk with anesthesia, bleeding (long-term outcomes uncertain with competing comorbidity)   - Ultimately patient was more aligned with watchful waiting type approach which I fully agree.  Acknowledged likely bladder  cancer, although will intervene only if medically necessary or significantly symptomatic - Additionally, I let her know that I was more than happy to have this conversation again with family members if she would like to  return   Overactive bladder Assessment & Plan: Chronic OAB/irritative voiding complaints with urge incontinence  -Likely a combination of intrinsic OAB + local effects from bladder tumor  - Will start with trospium  20 mg nightly - Low threshold to add additional medications, even at risk of polypharmacy if this improves her overall quality of life   Other orders -     Trospium  Chloride; Take 1 tablet (20 mg total) by mouth at bedtime.  Dispense: 30 tablet; Refill: 5       Penne JONELLE Skye, MD  [facility] [address], [address] [place], [place] [address] [phone] "

## 2024-03-24 ENCOUNTER — Ambulatory Visit
Admission: RE | Admit: 2024-03-24 | Discharge: 2024-03-24 | Disposition: A | Discharge status: Home / Self Care | Source: Ambulatory Visit | Attending: Radiation Oncology | Admitting: Radiation Oncology

## 2024-03-24 ENCOUNTER — Other Ambulatory Visit

## 2024-03-24 DIAGNOSIS — C3432 Malignant neoplasm of lower lobe, left bronchus or lung: Secondary | ICD-10-CM | POA: Insufficient documentation

## 2024-03-24 DIAGNOSIS — Z510 Encounter for antineoplastic radiation therapy: Secondary | ICD-10-CM | POA: Insufficient documentation

## 2024-03-25 ENCOUNTER — Ambulatory Visit (INDEPENDENT_AMBULATORY_CARE_PROVIDER_SITE_OTHER): Admitting: Urology

## 2024-03-25 VITALS — BP 132/67 | HR 81 | Ht 64.0 in | Wt 125.0 lb

## 2024-03-25 DIAGNOSIS — N3281 Overactive bladder: Secondary | ICD-10-CM | POA: Insufficient documentation

## 2024-03-25 DIAGNOSIS — N3289 Other specified disorders of bladder: Secondary | ICD-10-CM

## 2024-03-25 MED ORDER — TROSPIUM CHLORIDE 20 MG PO TABS: 20.0000 mg | ORAL_TABLET | Freq: Two times a day (BID) | ORAL | 5 refills | Status: AC

## 2024-03-25 MED ORDER — TROSPIUM CHLORIDE 20 MG PO TABS: 20.0000 mg | ORAL_TABLET | Freq: Every day | ORAL | 5 refills | Status: DC

## 2024-03-25 NOTE — Assessment & Plan Note (Signed)
 Chronic OAB/irritative voiding complaints with urge incontinence  -Likely a combination of intrinsic OAB + local effects from bladder tumor  - Will start with trospium  20 mg nightly - Low threshold to add additional medications, even at risk of polypharmacy if this improves her overall quality of life

## 2024-04-05 ENCOUNTER — Other Ambulatory Visit: Payer: Self-pay

## 2024-04-05 ENCOUNTER — Ambulatory Visit
Admission: RE | Admit: 2024-04-05 | Discharge: 2024-04-05 | Disposition: A | Source: Ambulatory Visit | Attending: Radiation Oncology | Admitting: Radiation Oncology

## 2024-04-05 LAB — RAD ONC ARIA SESSION SUMMARY
Course Elapsed Days: 0
Plan Fractions Treated to Date: 1
Plan Prescribed Dose Per Fraction: 11 Gy
Plan Total Fractions Prescribed: 5
Plan Total Prescribed Dose: 55 Gy
Reference Point Dosage Given to Date: 11 Gy
Reference Point Session Dosage Given: 11 Gy
Session Number: 1

## 2024-04-07 ENCOUNTER — Other Ambulatory Visit: Payer: Self-pay

## 2024-04-07 ENCOUNTER — Ambulatory Visit
Admission: RE | Admit: 2024-04-07 | Discharge: 2024-04-07 | Disposition: A | Discharge status: Home / Self Care | Source: Ambulatory Visit | Attending: Radiation Oncology | Admitting: Radiation Oncology

## 2024-04-07 LAB — RAD ONC ARIA SESSION SUMMARY
Course Elapsed Days: 2
Plan Fractions Treated to Date: 2
Plan Prescribed Dose Per Fraction: 11 Gy
Plan Total Fractions Prescribed: 5
Plan Total Prescribed Dose: 55 Gy
Reference Point Dosage Given to Date: 22 Gy
Reference Point Session Dosage Given: 11 Gy
Session Number: 2

## 2024-04-12 ENCOUNTER — Ambulatory Visit

## 2024-04-13 ENCOUNTER — Ambulatory Visit
Admission: RE | Admit: 2024-04-13 | Discharge: 2024-04-13 | Attending: Radiation Oncology | Admitting: Radiation Oncology

## 2024-04-13 ENCOUNTER — Other Ambulatory Visit: Payer: Self-pay

## 2024-04-13 LAB — RAD ONC ARIA SESSION SUMMARY
Course Elapsed Days: 8
Plan Fractions Treated to Date: 3
Plan Prescribed Dose Per Fraction: 11 Gy
Plan Total Fractions Prescribed: 5
Plan Total Prescribed Dose: 55 Gy
Reference Point Dosage Given to Date: 33 Gy
Reference Point Session Dosage Given: 11 Gy
Session Number: 3

## 2024-04-14 ENCOUNTER — Other Ambulatory Visit: Payer: Self-pay

## 2024-04-14 ENCOUNTER — Ambulatory Visit
Admission: RE | Admit: 2024-04-14 | Discharge: 2024-04-14 | Disposition: A | Discharge status: Home / Self Care | Source: Ambulatory Visit | Attending: Radiation Oncology | Admitting: Radiation Oncology

## 2024-04-14 LAB — RAD ONC ARIA SESSION SUMMARY
Course Elapsed Days: 9
Plan Fractions Treated to Date: 4
Plan Prescribed Dose Per Fraction: 11 Gy
Plan Total Fractions Prescribed: 5
Plan Total Prescribed Dose: 55 Gy
Reference Point Dosage Given to Date: 44 Gy
Reference Point Session Dosage Given: 11 Gy
Session Number: 4

## 2024-04-18 ENCOUNTER — Ambulatory Visit

## 2024-04-19 ENCOUNTER — Ambulatory Visit
Admission: RE | Admit: 2024-04-19 | Discharge: 2024-04-19 | Attending: Radiation Oncology | Admitting: Radiation Oncology

## 2024-04-19 ENCOUNTER — Other Ambulatory Visit: Payer: Self-pay

## 2024-04-19 ENCOUNTER — Ambulatory Visit

## 2024-04-19 LAB — RAD ONC ARIA SESSION SUMMARY
Course Elapsed Days: 14
Plan Fractions Treated to Date: 5
Plan Prescribed Dose Per Fraction: 11 Gy
Plan Total Fractions Prescribed: 5
Plan Total Prescribed Dose: 55 Gy
Reference Point Dosage Given to Date: 55 Gy
Reference Point Session Dosage Given: 11 Gy
Session Number: 5

## 2024-04-20 NOTE — Radiation Completion Notes (Signed)
 Patient Name: Kristin [name], DETTMANN MRN: [id number] Date of Birth: 1945/09/18 Referring Physician: TAMRA LEVENTHAL, M.D. Date of Service: 2024-04-20 Radiation Oncologist: Marcey Penton, M.D. [facility] [place]                             RADIATION ONCOLOGY END OF TREATMENT NOTE     Diagnosis: R91.8 Other nonspecific abnormal finding of lung field Intent: Curative     HPI: Patient is a 79 year old female with multiple medical comorbidities including severe COPD peripheral vascular disease type 2 diabetes major depression who is been followed by progressive left lower lobe lung lesion.  Lesion is approximately 2 cm in greatest dimension.  She did undergo a PET CT scan showing hypermetabolic 2.0 x 1.9 cm left lower lobe pulmonary nodule with intense metabolic activity concerning for bronchogenic carcinoma.  No metastatic adenopathy or distant metastatic disease was noted.  Patient is on chronic nasal oxygen and wheelchair-bound frail.  She also claims she has been having some hallucinations about her dead husband.  She specifically denies hemoptysis significant cough or chest pain.      ==========DELIVERED PLANS==========  First Treatment Date: 2024-04-05 Last Treatment Date: 2024-04-19   Plan Name: Lung_L_SBRT Site: Lung, Left Technique: SBRT/SRT-IMRT Mode: Photon Dose Per Fraction: 11 Gy Prescribed Dose (Delivered / Prescribed): 55 Gy / 55 Gy Prescribed Fxs (Delivered / Prescribed): 5 / 5     ==========ON TREATMENT VISIT DATES========== 2024-04-05, 2024-04-07, 2024-04-13, 2024-04-13, 2024-04-14, 2024-04-19     ==========UPCOMING VISITS========== 05/19/2024 [facility] RAD ONCOLOGY FOLLOW UP 30 Chrystal, Glenn, MD        ==========APPENDIX - ON TREATMENT VISIT NOTES==========   See weekly On Treatment Notes in Epic for details in the Media tab (listed as Progress notes on the On Treatment Visit Dates listed above).

## 2024-05-19 ENCOUNTER — Ambulatory Visit: Admitting: Radiation Oncology

## 2025-03-24 ENCOUNTER — Ambulatory Visit: Admitting: Urology
